# Patient Record
Sex: Male | Born: 1940 | Race: Black or African American | Hispanic: No | Marital: Married | Smoking: Former smoker
Health system: Southern US, Community
[De-identification: ages and names within clinical notes are randomized; demographics above are authoritative.]

## PROBLEM LIST (undated history)

## (undated) DIAGNOSIS — Z923 Personal history of irradiation: Secondary | ICD-10-CM

## (undated) DIAGNOSIS — Z9989 Dependence on other enabling machines and devices: Secondary | ICD-10-CM

## (undated) DIAGNOSIS — R399 Unspecified symptoms and signs involving the genitourinary system: Secondary | ICD-10-CM

## (undated) DIAGNOSIS — Z973 Presence of spectacles and contact lenses: Secondary | ICD-10-CM

## (undated) DIAGNOSIS — H1033 Unspecified acute conjunctivitis, bilateral: Secondary | ICD-10-CM

## (undated) DIAGNOSIS — I440 Atrioventricular block, first degree: Secondary | ICD-10-CM

## (undated) DIAGNOSIS — M1990 Unspecified osteoarthritis, unspecified site: Secondary | ICD-10-CM

## (undated) DIAGNOSIS — G4733 Obstructive sleep apnea (adult) (pediatric): Secondary | ICD-10-CM

## (undated) DIAGNOSIS — Z86010 Personal history of colonic polyps: Secondary | ICD-10-CM

## (undated) DIAGNOSIS — I491 Atrial premature depolarization: Secondary | ICD-10-CM

## (undated) DIAGNOSIS — N400 Enlarged prostate without lower urinary tract symptoms: Secondary | ICD-10-CM

## (undated) DIAGNOSIS — Z8739 Personal history of other diseases of the musculoskeletal system and connective tissue: Secondary | ICD-10-CM

## (undated) DIAGNOSIS — Z8669 Personal history of other diseases of the nervous system and sense organs: Secondary | ICD-10-CM

## (undated) DIAGNOSIS — Z87898 Personal history of other specified conditions: Secondary | ICD-10-CM

## (undated) DIAGNOSIS — I10 Essential (primary) hypertension: Secondary | ICD-10-CM

## (undated) DIAGNOSIS — C61 Malignant neoplasm of prostate: Secondary | ICD-10-CM

## (undated) DIAGNOSIS — E785 Hyperlipidemia, unspecified: Secondary | ICD-10-CM

## (undated) DIAGNOSIS — N320 Bladder-neck obstruction: Secondary | ICD-10-CM

## (undated) HISTORY — DX: Personal history of colonic polyps: Z86.010

## (undated) HISTORY — PX: TONSILLECTOMY: SUR1361

## (undated) HISTORY — DX: Unspecified osteoarthritis, unspecified site: M19.90

## (undated) HISTORY — DX: Hyperlipidemia, unspecified: E78.5

## (undated) HISTORY — DX: Essential (primary) hypertension: I10

---

## 1964-01-25 HISTORY — PX: APPENDECTOMY: SHX54

## 1998-10-20 ENCOUNTER — Ambulatory Visit (HOSPITAL_BASED_OUTPATIENT_CLINIC_OR_DEPARTMENT_OTHER): Admission: RE | Admit: 1998-10-20 | Discharge: 1998-10-20 | Payer: Self-pay | Admitting: Orthopedic Surgery

## 1999-02-01 ENCOUNTER — Ambulatory Visit (HOSPITAL_COMMUNITY): Admission: RE | Admit: 1999-02-01 | Discharge: 1999-02-01 | Payer: Self-pay | Admitting: Internal Medicine

## 1999-02-01 ENCOUNTER — Encounter: Payer: Self-pay | Admitting: Internal Medicine

## 2004-02-11 ENCOUNTER — Ambulatory Visit: Payer: Self-pay | Admitting: Internal Medicine

## 2004-02-17 ENCOUNTER — Ambulatory Visit: Payer: Self-pay | Admitting: Internal Medicine

## 2004-03-22 ENCOUNTER — Ambulatory Visit: Payer: Self-pay | Admitting: Internal Medicine

## 2004-03-22 ENCOUNTER — Encounter: Admission: RE | Admit: 2004-03-22 | Discharge: 2004-03-22 | Payer: Self-pay | Admitting: Internal Medicine

## 2004-03-27 ENCOUNTER — Emergency Department (HOSPITAL_COMMUNITY): Admission: EM | Admit: 2004-03-27 | Discharge: 2004-03-27 | Payer: Self-pay | Admitting: Emergency Medicine

## 2004-12-17 ENCOUNTER — Ambulatory Visit: Payer: Self-pay | Admitting: Internal Medicine

## 2005-02-21 ENCOUNTER — Ambulatory Visit: Payer: Self-pay | Admitting: Internal Medicine

## 2005-04-11 ENCOUNTER — Ambulatory Visit: Payer: Self-pay | Admitting: Internal Medicine

## 2005-05-02 ENCOUNTER — Ambulatory Visit: Payer: Self-pay | Admitting: Internal Medicine

## 2005-07-12 ENCOUNTER — Ambulatory Visit: Payer: Self-pay | Admitting: Internal Medicine

## 2005-08-16 ENCOUNTER — Ambulatory Visit: Payer: Self-pay | Admitting: Internal Medicine

## 2005-08-24 ENCOUNTER — Ambulatory Visit: Payer: Self-pay | Admitting: Internal Medicine

## 2005-09-15 ENCOUNTER — Ambulatory Visit: Payer: Self-pay | Admitting: Internal Medicine

## 2005-11-25 ENCOUNTER — Ambulatory Visit: Payer: Self-pay | Admitting: Internal Medicine

## 2006-05-30 ENCOUNTER — Ambulatory Visit: Payer: Self-pay | Admitting: Internal Medicine

## 2006-05-30 LAB — CONVERTED CEMR LAB
ALT: 29 units/L (ref 0–40)
AST: 19 units/L (ref 0–37)
BUN: 13 mg/dL (ref 6–23)
Cholesterol: 177 mg/dL (ref 0–200)
Creatinine, Ser: 1.1 mg/dL (ref 0.4–1.5)
HDL: 42.6 mg/dL (ref >39.0)
LDL Cholesterol: 117 mg/dL — ABNORMAL HIGH (ref 0–99)
PSA: 5.07 ng/mL — ABNORMAL HIGH (ref 0.10–4.00)
Total CHOL/HDL Ratio: 4.2
Triglycerides: 88 mg/dL (ref 0–149)
VLDL: 18 mg/dL (ref 0–40)

## 2006-06-23 ENCOUNTER — Ambulatory Visit: Payer: Self-pay | Admitting: Internal Medicine

## 2006-07-06 ENCOUNTER — Ambulatory Visit: Payer: Self-pay | Admitting: Internal Medicine

## 2006-07-06 HISTORY — PX: COLONOSCOPY: SHX174

## 2006-09-09 ENCOUNTER — Encounter: Payer: Self-pay | Admitting: *Deleted

## 2006-09-09 DIAGNOSIS — I10 Essential (primary) hypertension: Secondary | ICD-10-CM | POA: Insufficient documentation

## 2006-10-27 ENCOUNTER — Encounter: Payer: Self-pay | Admitting: Internal Medicine

## 2006-10-27 ENCOUNTER — Ambulatory Visit: Payer: Self-pay | Admitting: Internal Medicine

## 2006-10-27 DIAGNOSIS — E785 Hyperlipidemia, unspecified: Secondary | ICD-10-CM | POA: Insufficient documentation

## 2006-10-27 LAB — CONVERTED CEMR LAB
ALT: 29 units/L (ref 0–53)
AST: 21 units/L (ref 0–37)
BUN: 11 mg/dL (ref 6–23)
CO2: 34 meq/L — ABNORMAL HIGH (ref 19–32)
Calcium: 9.6 mg/dL (ref 8.4–10.5)
Chloride: 103 meq/L (ref 96–112)
Cholesterol: 153 mg/dL (ref 0–200)
Creatinine, Ser: 1.3 mg/dL (ref 0.4–1.5)
GFR calc Af Amer: 71 mL/min
GFR calc non Af Amer: 59 mL/min
Glucose, Bld: 109 mg/dL — ABNORMAL HIGH (ref 70–99)
HDL: 41.8 mg/dL (ref >39.0)
LDL Cholesterol: 89 mg/dL (ref 0–99)
Potassium: 4.2 meq/L (ref 3.5–5.1)
Sodium: 142 meq/L (ref 135–145)
Total CHOL/HDL Ratio: 3.7
Triglycerides: 112 mg/dL (ref 0–149)
VLDL: 22 mg/dL (ref 0–40)

## 2006-12-05 ENCOUNTER — Telehealth (INDEPENDENT_AMBULATORY_CARE_PROVIDER_SITE_OTHER): Payer: Self-pay | Admitting: *Deleted

## 2006-12-06 ENCOUNTER — Ambulatory Visit: Payer: Self-pay | Admitting: Internal Medicine

## 2006-12-19 ENCOUNTER — Encounter: Payer: Self-pay | Admitting: Internal Medicine

## 2007-01-19 ENCOUNTER — Telehealth: Payer: Self-pay | Admitting: Internal Medicine

## 2007-02-21 ENCOUNTER — Ambulatory Visit: Payer: Self-pay | Admitting: Internal Medicine

## 2007-03-15 ENCOUNTER — Ambulatory Visit: Payer: Self-pay | Admitting: Internal Medicine

## 2007-03-15 DIAGNOSIS — H6980 Other specified disorders of Eustachian tube, unspecified ear: Secondary | ICD-10-CM

## 2007-03-15 DIAGNOSIS — R109 Unspecified abdominal pain: Secondary | ICD-10-CM | POA: Insufficient documentation

## 2007-03-15 DIAGNOSIS — H6990 Unspecified Eustachian tube disorder, unspecified ear: Secondary | ICD-10-CM | POA: Insufficient documentation

## 2007-03-23 ENCOUNTER — Encounter: Payer: Self-pay | Admitting: Internal Medicine

## 2007-05-29 ENCOUNTER — Encounter: Payer: Self-pay | Admitting: Internal Medicine

## 2007-06-22 ENCOUNTER — Inpatient Hospital Stay (HOSPITAL_COMMUNITY): Admission: RE | Admit: 2007-06-22 | Discharge: 2007-06-27 | Payer: Self-pay | Admitting: Orthopedic Surgery

## 2007-06-22 HISTORY — PX: TOTAL KNEE ARTHROPLASTY: SHX125

## 2007-06-25 ENCOUNTER — Telehealth: Payer: Self-pay | Admitting: Internal Medicine

## 2007-07-25 ENCOUNTER — Ambulatory Visit: Payer: Self-pay | Admitting: Internal Medicine

## 2007-07-25 DIAGNOSIS — Z9889 Other specified postprocedural states: Secondary | ICD-10-CM

## 2007-10-24 ENCOUNTER — Ambulatory Visit: Payer: Self-pay | Admitting: Internal Medicine

## 2008-01-14 ENCOUNTER — Ambulatory Visit: Payer: Self-pay | Admitting: Internal Medicine

## 2008-01-14 LAB — CONVERTED CEMR LAB
ALT: 22 units/L (ref 0–53)
AST: 21 units/L (ref 0–37)
Albumin: 4 g/dL (ref 3.5–5.2)
Alkaline Phosphatase: 84 units/L (ref 39–117)
BUN: 11 mg/dL (ref 6–23)
Bilirubin, Direct: 0.1 mg/dL (ref 0.0–0.3)
CO2: 31 meq/L (ref 19–32)
Calcium: 9.1 mg/dL (ref 8.4–10.5)
Chloride: 103 meq/L (ref 96–112)
Cholesterol: 153 mg/dL (ref 0–200)
Creatinine, Ser: 1.2 mg/dL (ref 0.4–1.5)
GFR calc Af Amer: 78 mL/min
GFR calc non Af Amer: 65 mL/min
Glucose, Bld: 117 mg/dL — ABNORMAL HIGH (ref 70–99)
HDL: 46 mg/dL (ref >39.0)
LDL Cholesterol: 93 mg/dL (ref 0–99)
Potassium: 3.7 meq/L (ref 3.5–5.1)
Sodium: 141 meq/L (ref 135–145)
Total Bilirubin: 0.7 mg/dL (ref 0.3–1.2)
Total CHOL/HDL Ratio: 3.3
Total Protein: 7 g/dL (ref 6.0–8.3)
Triglycerides: 69 mg/dL (ref 0–149)
VLDL: 14 mg/dL (ref 0–40)

## 2008-01-16 ENCOUNTER — Encounter: Payer: Self-pay | Admitting: Internal Medicine

## 2008-05-01 ENCOUNTER — Ambulatory Visit: Payer: Self-pay | Admitting: Internal Medicine

## 2008-06-27 ENCOUNTER — Telehealth: Payer: Self-pay | Admitting: Internal Medicine

## 2008-08-07 ENCOUNTER — Telehealth: Payer: Self-pay | Admitting: Internal Medicine

## 2008-10-24 ENCOUNTER — Telehealth (INDEPENDENT_AMBULATORY_CARE_PROVIDER_SITE_OTHER): Payer: Self-pay | Admitting: *Deleted

## 2008-10-30 ENCOUNTER — Ambulatory Visit: Payer: Self-pay | Admitting: Internal Medicine

## 2009-03-17 ENCOUNTER — Ambulatory Visit: Payer: Self-pay | Admitting: Internal Medicine

## 2009-03-27 ENCOUNTER — Ambulatory Visit: Payer: Self-pay | Admitting: Internal Medicine

## 2009-03-27 DIAGNOSIS — M25569 Pain in unspecified knee: Secondary | ICD-10-CM

## 2009-03-27 DIAGNOSIS — M79609 Pain in unspecified limb: Secondary | ICD-10-CM

## 2009-03-27 LAB — CONVERTED CEMR LAB
BUN: 11 mg/dL (ref 6–23)
CO2: 32 meq/L (ref 19–32)
Calcium: 8.9 mg/dL (ref 8.4–10.5)
Chloride: 103 meq/L (ref 96–112)
Creatinine, Ser: 1.1 mg/dL (ref 0.4–1.5)
GFR calc non Af Amer: 85.9 mL/min (ref >60)
Glucose, Bld: 99 mg/dL (ref 70–99)
Potassium: 3.7 meq/L (ref 3.5–5.1)
Sed Rate: 33 mm/hr — ABNORMAL HIGH (ref 0–22)
Sodium: 141 meq/L (ref 135–145)
Uric Acid, Serum: 7.5 mg/dL (ref 4.0–7.8)

## 2009-06-04 IMAGING — CR DG KNEE 1-2V PORT*R*
2 series · 2 of 2 positions shown · non-contrast
Comparison: None

CLINICAL DATA: OA right knee.  Status post right TKR

PORTABLE RIGHT KNEE - 1-2 VIEW

[view not recorded (1 of 2)]
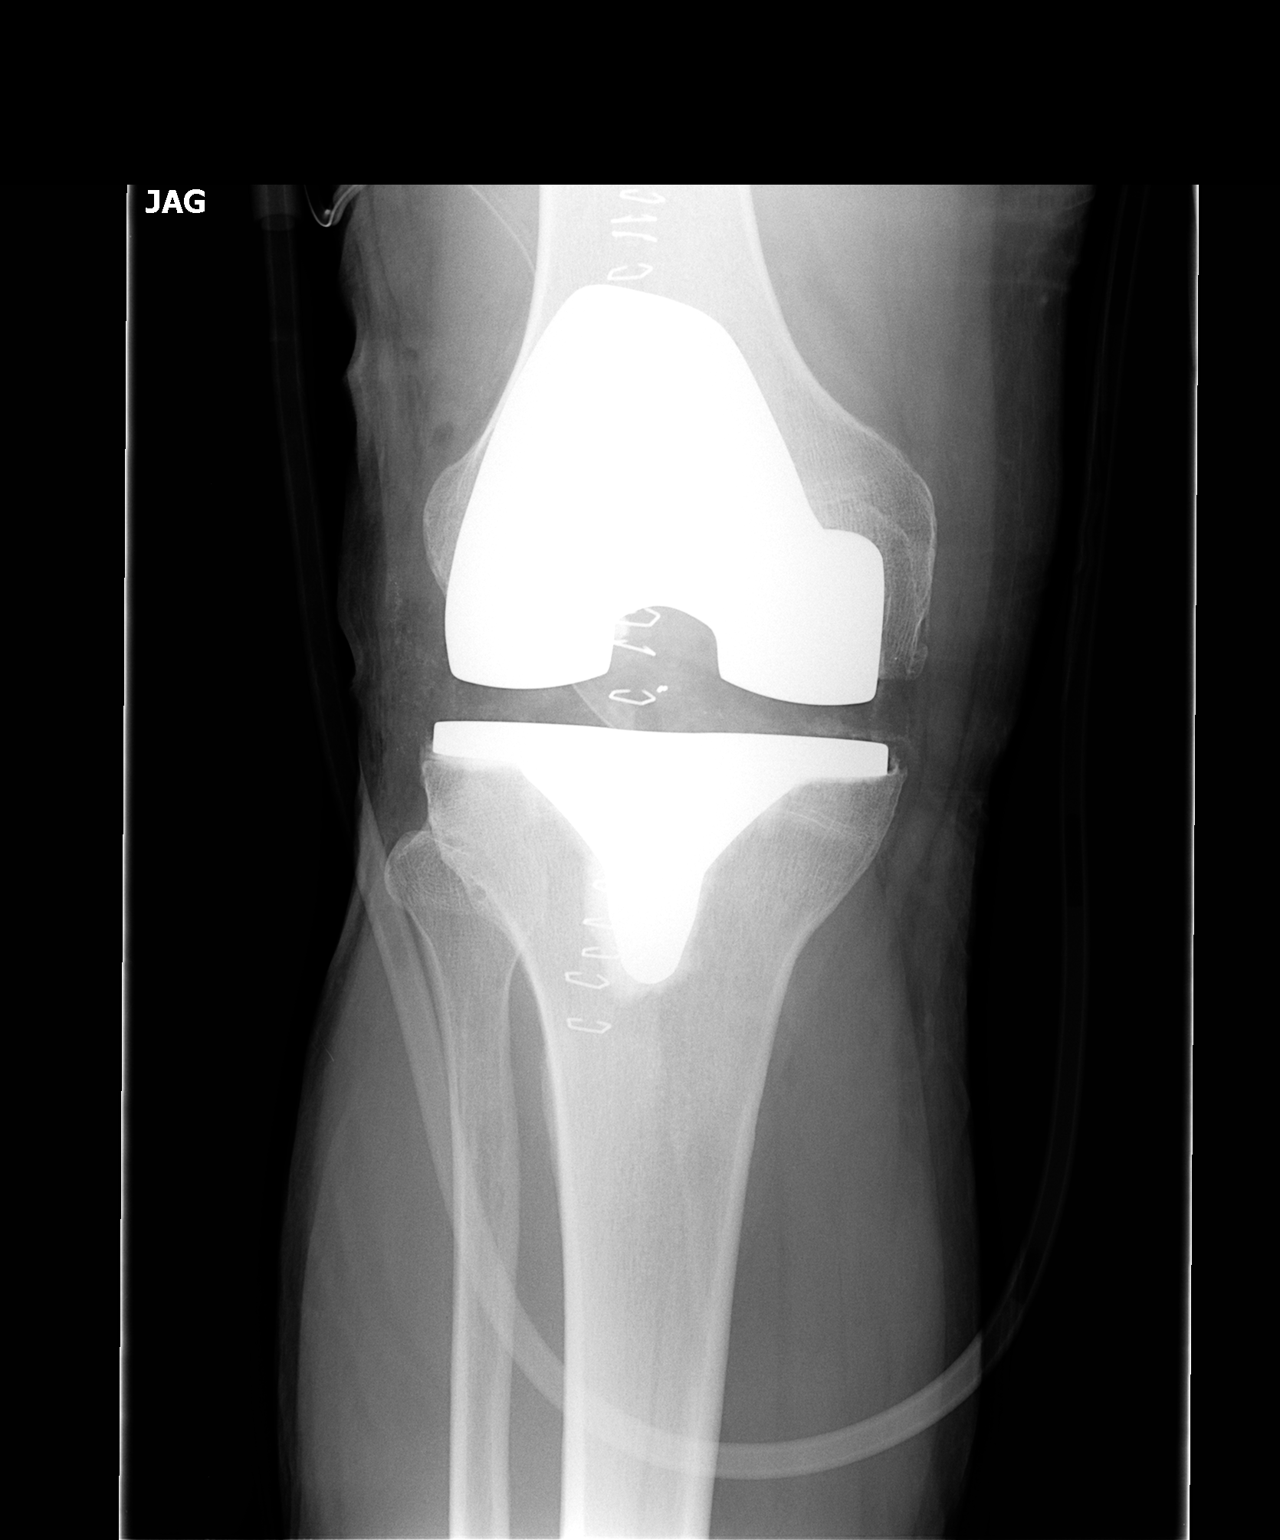

[view not recorded (2 of 2)]
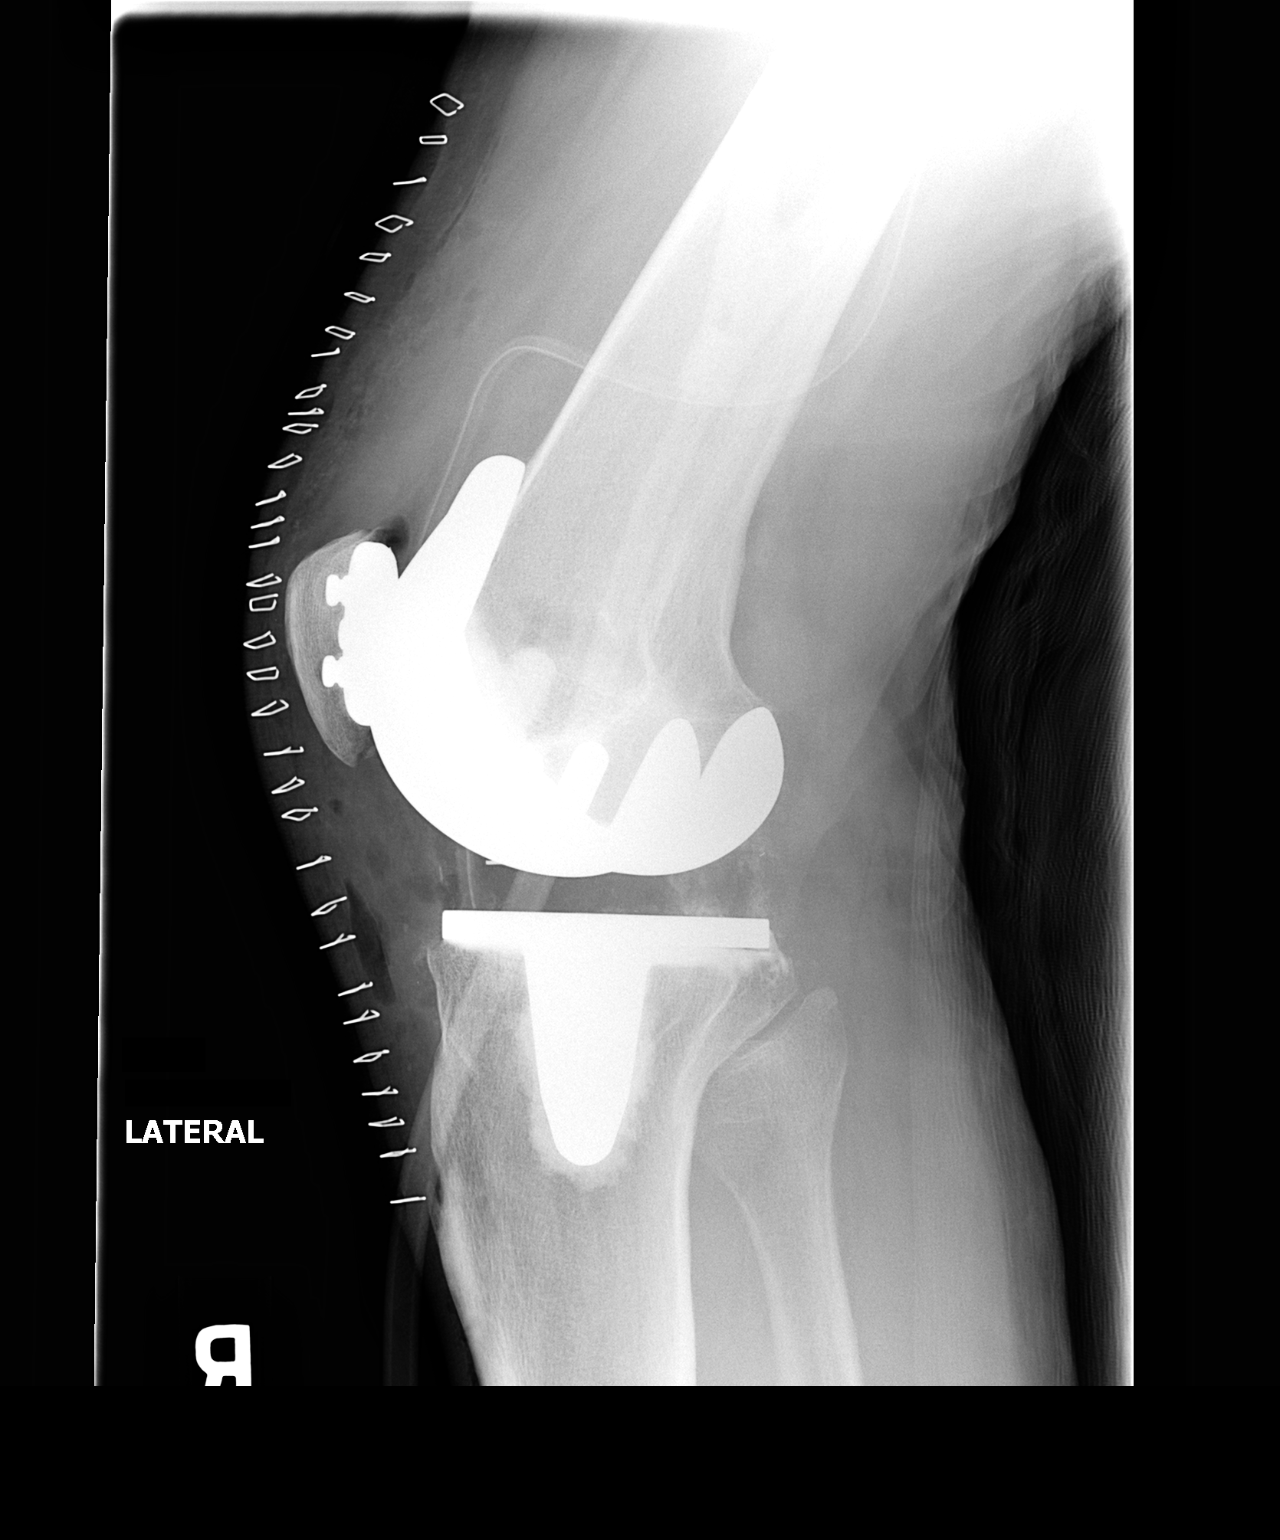

[2 of 2 positions shown; findings below may reference images not displayed]

FINDINGS: The patient the is status post right total knee
arthroplasty.  The distal femoral, proximal tibial, and patellar
prosthetic components appear in satisfactory position and
alignment.  No periprosthetic abnormality.  Radiopaque drain and
anterior row of superficial staples are noted.
IMPRESSION: Satisfactory appearance of right TKR

## 2009-08-06 ENCOUNTER — Ambulatory Visit: Payer: Self-pay | Admitting: Internal Medicine

## 2009-08-06 DIAGNOSIS — N320 Bladder-neck obstruction: Secondary | ICD-10-CM

## 2009-08-06 LAB — CONVERTED CEMR LAB
Bilirubin Urine: NEGATIVE
Nitrite: NEGATIVE
Protein, U semiquant: NEGATIVE
Specific Gravity, Urine: 1.02

## 2009-08-20 ENCOUNTER — Telehealth: Payer: Self-pay | Admitting: Internal Medicine

## 2009-09-08 ENCOUNTER — Telehealth: Payer: Self-pay | Admitting: Internal Medicine

## 2009-09-09 ENCOUNTER — Telehealth: Payer: Self-pay | Admitting: Internal Medicine

## 2009-09-24 ENCOUNTER — Ambulatory Visit: Payer: Self-pay | Admitting: Internal Medicine

## 2009-11-19 ENCOUNTER — Telehealth: Payer: Self-pay | Admitting: Internal Medicine

## 2009-12-11 ENCOUNTER — Ambulatory Visit: Payer: Self-pay | Admitting: Internal Medicine

## 2010-02-25 NOTE — Progress Notes (Signed)
  Phone Note Other Incoming   Caller: walk in    Prescriptions: TAMSULOSIN HCL 0.4 MG CAPS (TAMSULOSIN HCL) 1 by mouth once daily  #90 x 3   Entered by:   Ami Bullins CMA   Authorized by:   Jacques Navy MD   Signed by:   Bill Salinas CMA on 09/08/2009   Method used:   Electronically to        [facility] [phone]* (retail)       [address]       [place], [place]  [address]       Ph: [phone]       Fax: [phone]   RxID:   [id number]

## 2010-02-25 NOTE — Assessment & Plan Note (Signed)
Summary: left leg swollen and tremendous pain-norins-lb   Vitals Entered By: Tora Perches (March 27, 2009 1:32 PM) CC: swollen left leg  since yesterday Is Patient Diabetic? No   Primary Care Provider:  Norins  CC:  swollen left leg  since yesterday.  History of Present Illness: C/o severe sharp L knee pain since last night. No injury.  Preventive Screening-Counseling & Management  Alcohol-Tobacco     Smoking Status: quit < 6 months  Allergies: No Known Drug Allergies  Past History:  Past Medical History: Last updated: 10/27/2006 UCD Hypertension osteoarthritis-mostly knees back pain Hyperlipidemia  Social History: Last updated: 03/15/2007 married 8 years-divorced. 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  Social History: Smoking Status:  quit < 6 months  Physical Exam  General:  Well-developed,well-nourished,in no acute distress; alert,appropriate and cooperative throughout examination Lungs:  Normal respiratory effort, chest expands symmetrically. Lungs are clear to auscultation, no crackles or wheezes. Heart:  normal rate and regular rhythm.   Msk:  Walking w/a cane, limping L knee w/mild effusion, tender Extremities:  No edema Skin:  No rash Psych:  Oriented X3.     Impression & Recommendations:  Problem # 1:  KNEE PAIN (ICD-719.46) L - likely OA flare Assessment New Will tap if  not better by Mon His updated medication list for this problem includes:    Adult Aspirin Ec Low Strength 81 Mg Tbec (Aspirin) ..... Once daily    Hydrocodone-acetaminophen 5-325 Mg Tabs (Hydrocodone-acetaminophen) .Marland Kitchen... 1-2 by mouth two times a day as needed pain    Ibuprofen 600 Mg Tabs (Ibuprofen) .Marland Kitchen... 1 by mouth bid  pc x 1 wk then as needed for  pain  Orders: TLB-BMP (Basic Metabolic Panel-BMET) (80048-METABOL) TLB-Sedimentation Rate (ESR) (85652-ESR) TLB-Uric Acid, Blood (84550-URIC) Ace Wraps 3-5  in/yard  (E4540)  Problem # 2:  HYPERTENSION (ICD-401.9) Assessment: Deteriorated  The following medications were removed from the medication list:    Benicar 20 Mg Tabs (Olmesartan medoxomil) .Marland Kitchen... 1 once daily His updated medication list for this problem includes:    Cardizem Cd 360 Mg Cp24 (Diltiazem hcl coated beads) .Marland Kitchen... 1 once daily    Furosemide 40 Mg Tabs (Furosemide) .Marland Kitchen... 1 once daily    Benicar 40 Mg Tabs (Olmesartan medoxomil) .Marland Kitchen... 1 by mouth once daily for blood pressure  Orders: TLB-BMP (Basic Metabolic Panel-BMET) (80048-METABOL) TLB-Sedimentation Rate (ESR) (85652-ESR) TLB-Uric Acid, Blood (84550-URIC)  Problem # 3:  BRONCHITIS-ACUTE (ICD-466.0) Assessment: Improved Finish meds His updated medication list for this problem includes:    Sulfamethoxazole-tmp Ds 800-160 Mg Tabs (Sulfamethoxazole-trimethoprim) .Marland Kitchen... 1 by mouth two times a day x 7 for bronchitis    Promethazine-codeine 6.25-10 Mg/37ml Syrp (Promethazine-codeine) .Marland Kitchen... 1 tsp q 6 hours for cough  Complete Medication List: 1)  Flonase 50 Mcg/act Susp (Fluticasone propionate) .... As needed 2)  Adult Aspirin Ec Low Strength 81 Mg Tbec (Aspirin) .... Once daily 3)  Cardizem Cd 360 Mg Cp24 (Diltiazem hcl coated beads) .Marland Kitchen.. 1 once daily 4)  Lovastatin 20 Mg Tabs (Lovastatin) .... Two tabs in the am 5)  Furosemide 40 Mg Tabs (Furosemide) .Marland Kitchen.. 1 once daily 6)  Sulfamethoxazole-tmp Ds 800-160 Mg Tabs (Sulfamethoxazole-trimethoprim) .Marland Kitchen.. 1 by mouth two times a day x 7 for bronchitis 7)  Promethazine-codeine 6.25-10 Mg/88ml Syrp (Promethazine-codeine) .Marland Kitchen.. 1 tsp q 6 hours for cough 8)  Benicar 40 Mg Tabs (Olmesartan medoxomil) .Marland Kitchen.. 1 by mouth once daily for blood  pressure 9)  Hydrocodone-acetaminophen 5-325 Mg Tabs (Hydrocodone-acetaminophen) .Marland Kitchen.. 1-2 by mouth two times a day as needed pain 10)  Ibuprofen 600 Mg Tabs (Ibuprofen) .Marland Kitchen.. 1 by mouth bid  pc x 1 wk then as needed for  pain  Other Orders: Depo- Medrol  40mg  (J1030) Depo- Medrol 80mg  (J1040) Admin of Therapeutic Inj  intramuscular or subcutaneous (04540)  Patient Instructions: 1)  Please schedule a follow-up appointment in 2 weeks Dr Debby Bud. 2)  Call if you are not better in a reasonable amount of time or if worse. 3)  Pennsaid 4 gtt three times a day samples  Prescriptions: IBUPROFEN 600 MG TABS (IBUPROFEN) 1 by mouth bid  pc x 1 wk then as needed for  pain  #60 x 3   Entered and Authorized by:   Tresa Garter MD   Signed by:   Tresa Garter MD on 03/27/2009   Method used:   Print then Give to Patient   RxID:   [id number] HYDROCODONE-ACETAMINOPHEN 5-325 MG TABS (HYDROCODONE-ACETAMINOPHEN) 1-2 by mouth two times a day as needed pain  #60 x 1   Entered and Authorized by:   Tresa Garter MD   Signed by:   Tresa Garter MD on 03/27/2009   Method used:   Print then Give to Patient   RxID:   [id number] BENICAR 40 MG TABS (OLMESARTAN MEDOXOMIL) 1 by mouth once daily for blood pressure  #30 x 12   Entered and Authorized by:   Tresa Garter MD   Signed by:   Tresa Garter MD on 03/27/2009   Method used:   [facility] to        [facility]  [address] [phone][facility] (retail)       [address]       [place], [place]  [address]       Ph: [phone]       Fax: [phone]   RxID:   [id number]    Medication Administration  Injection # 1:    Medication: Depo- Medrol 40mg     Diagnosis: LEG PAIN, LEFT (ICD-729.5)    Route: IM    Site: RUOQ gluteus    Exp Date: 10/2009    Lot #: [id number]    Mfr: teva    Patient tolerated injection without complications    Given by: Tora Perches (March 27, 2009 3:18 PM)  Injection # 2:    Medication: Depo- Medrol 80mg     Diagnosis: LEG PAIN, LEFT (ICD-729.5)    Route: IM    Site: RUOQ gluteus    Exp Date: 10/2009    Lot #: [id number]    Mfr: teva    Patient tolerated injection without complications    Given by: Tora Perches  (March 27, 2009 3:18 PM)  Orders Added: 1)  TLB-BMP (Basic Metabolic Panel-BMET) [80048-METABOL] 2)  TLB-Sedimentation Rate (ESR) [85652-ESR] 3)  TLB-Uric Acid, Blood [84550-URIC] 4)  Est. Patient Level IV [32202] 5)  Ace Wraps 3-5 in/yard  [A6449] 6)  Depo- Medrol 40mg  [J1030] 7)  Depo- Medrol 80mg  [J1040] 8)  Admin of Therapeutic Inj  intramuscular or subcutaneous [54270]

## 2010-02-25 NOTE — Assessment & Plan Note (Signed)
Summary: PROBLEMS URINATING/NWS  #   Vital Signs:  Patient profile:   70 year old male Height:      69 inches Weight:      225 pounds BMI:     33.35 O2 Sat:      97 % on Room air Temp:     98.3 degrees F oral Pulse rate:   69 / minute BP sitting:   130 / 90  (left arm) Cuff size:   large  Vitals Entered By: Bill Salinas CMA (August 06, 2009 3:42 PM)  O2 Flow:  Room air CC: pt c/o urinary freq. He states he gets up to use restroom about 10 times in one night/ ab   Primary Care Provider:  Laquesha Holcomb  CC:  pt c/o urinary freq. He states he gets up to use restroom about 10 times in one night/ ab.  History of Present Illness: Patient presents for increased urinary freqeuncy and nocturia. He has decreased force of stream, intermittancy, post-void dribble, nocturia. He denies fever, perineal pain, low back pain, groin pain.   Current Medications (verified): 1)  Flonase 50 Mcg/act  Susp (Fluticasone Propionate) .... As Needed 2)  Adult Aspirin Ec Low Strength 81 Mg  Tbec (Aspirin) .... Once Daily 3)  Cardizem Cd 360 Mg  Cp24 (Diltiazem Hcl Coated Beads) .Marland Kitchen.. 1 Once Daily 4)  Lovastatin 20 Mg  Tabs (Lovastatin) .... Two Tabs in The Am 5)  Furosemide 40 Mg  Tabs (Furosemide) .Marland Kitchen.. 1 Once Daily 6)  Sulfamethoxazole-Tmp Ds 800-160 Mg Tabs (Sulfamethoxazole-Trimethoprim) .Marland Kitchen.. 1 By Mouth Two Times A Day X 7 For Bronchitis 7)  Promethazine-Codeine 6.25-10 Mg/22ml Syrp (Promethazine-Codeine) .Marland Kitchen.. 1 Tsp Q 6 Hours For Cough 8)  Benicar 40 Mg Tabs (Olmesartan Medoxomil) .Marland Kitchen.. 1 By Mouth Once Daily For Blood Pressure 9)  Hydrocodone-Acetaminophen 5-325 Mg Tabs (Hydrocodone-Acetaminophen) .Marland Kitchen.. 1-2 By Mouth Two Times A Day As Needed Pain 10)  Ibuprofen 600 Mg Tabs (Ibuprofen) .Marland Kitchen.. 1 By Mouth Bid  Pc X 1 Wk Then As Needed For  Pain  Allergies (verified): No Known Drug Allergies  Past History:  Past Medical History: Last updated: 10/27/2006 UCD Hypertension osteoarthritis-mostly knees back  pain Hyperlipidemia  Past Surgical History: Last updated: 07/25/2007 Appendectomy Tonsillectomy Total knee replacement, right - May '09  Family History: Last updated: 03/26/07 parents deceased of unkown causes. neg- DM, CAD, Colon or prostate cancer.  Social History: Last updated: 03/26/2007 married 8 years-divorced. 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  Review of Systems  The patient denies anorexia, fever, weight loss, weight gain, decreased hearing, hoarseness, chest pain, peripheral edema, headaches, abdominal pain, hematochezia, hematuria, muscle weakness, difficulty walking, unusual weight change, abnormal bleeding, and angioedema.    Physical Exam  General:  overweight AA male in no distress Head:  Normocephalic and atraumatic without obvious abnormalities. No apparent alopecia or balding. Ears:  cerumen impaction left ear. Lungs:  normal respiratory effort and normal breath sounds.   Heart:  normal rate and regular rhythm.     Impression & Recommendations:  Problem # 1:  BENIGN PROSTATIC HYPERTROPHY, WITH URINARY OBSTRUCTION (ICD-600.01) symptoms are classic for mild BOO secondary to BPH  Plan - trial of tamulosin + dusteride, if successful will write for generics.  His updated medication list for this problem includes:    Jalyn 0.5-0.4 Mg Caps (Dutasteride-tamsulosin hcl) .Marland Kitchen... 1 by mouth once daily  Problem # 2:  CERUMEN IMPACTION, LEFT (  ICD-380.4) ear was impaction left. Successfully irrigated by  Ami Bullin, CMA  Complete Medication List: 1)  Flonase 50 Mcg/act Susp (Fluticasone propionate) .... As needed 2)  Adult Aspirin Ec Low Strength 81 Mg Tbec (Aspirin) .... Once daily 3)  Cardizem Cd 360 Mg Cp24 (Diltiazem hcl coated beads) .Marland Kitchen.. 1 once daily 4)  Lovastatin 20 Mg Tabs (Lovastatin) .... Two tabs in the am 5)  Furosemide 40 Mg Tabs (Furosemide) .Marland Kitchen.. 1 once daily 6)   Sulfamethoxazole-tmp Ds 800-160 Mg Tabs (Sulfamethoxazole-trimethoprim) .Marland Kitchen.. 1 by mouth two times a day x 7 for bronchitis 7)  Promethazine-codeine 6.25-10 Mg/59ml Syrp (Promethazine-codeine) .Marland Kitchen.. 1 tsp q 6 hours for cough 8)  Benicar 40 Mg Tabs (Olmesartan medoxomil) .Marland Kitchen.. 1 by mouth once daily for blood pressure 9)  Hydrocodone-acetaminophen 5-325 Mg Tabs (Hydrocodone-acetaminophen) .Marland Kitchen.. 1-2 by mouth two times a day as needed pain 10)  Ibuprofen 600 Mg Tabs (Ibuprofen) .Marland Kitchen.. 1 by mouth bid  pc x 1 wk then as needed for  pain 11)  Jalyn 0.5-0.4 Mg Caps (Dutasteride-tamsulosin hcl) .Marland Kitchen.. 1 by mouth once daily  Patient Instructions: 1)  Urinary problems - sounds like prostatism - symptoms of enlarged prostate gland leading to poor bladder emptying and urinary frequency. Plan - take Jalyn once a day (21 days of samples). Let me know if this helps.   Laboratory Results   Urine Tests   Date/Time Reported: Ami Bullins CMA  August 06, 2009 3:49 PM   Routine Urinalysis   Color: straw Appearance: Clear Glucose: negative   (Normal Range: Negative) Bilirubin: negative   (Normal Range: Negative) Ketone: negative   (Normal Range: Negative) Spec. Gravity: 1.020   (Normal Range: 1.003-1.035) Blood: moderate   (Normal Range: Negative) pH: 5.0   (Normal Range: 5.0-8.0) Protein: negative   (Normal Range: Negative) Urobilinogen: 0.2   (Normal Range: 0-1) Nitrite: negative   (Normal Range: Negative) Leukocyte Esterace: negative   (Normal Range: Negative)

## 2010-02-25 NOTE — Progress Notes (Signed)
  Phone Note Other Incoming   Caller: Walk in Details for Reason: Cardizem too expensive Summary of Call: Pt walked in and states that his cardizem is too expensive, he cannot afford the copay on this medication and would like a cheaper alternative. Please Adise. Rx to [facility] on [address] Initial call taken by: Ami Bullins CMA,  November 19, 2009 1:05 PM  Follow-up for Phone Call        change to taztia XT 360 1 a day - cheap as a generic and less expensive than brand name cardiazem CD Follow-up by: Jacques Navy MD,  November 19, 2009 1:25 PM    New/Updated Medications: TAZTIA XT 360 MG XR24H-CAP (DILTIAZEM HCL ER BEADS) 1 by mouth once daily Prescriptions: TAZTIA XT 360 MG XR24H-CAP (DILTIAZEM HCL ER BEADS) 1 by mouth once daily  #90 x 1   Entered by:   Ami Bullins CMA   Authorized by:   Jacques Navy MD   Signed by:   Bill Salinas CMA on 11/19/2009   Method used:   Electronically to        [facility] [phone]* (retail)       [address]       [place], [place]  [address]       Ph: [phone]       Fax: [phone]   RxID:   [id number]

## 2010-02-25 NOTE — Assessment & Plan Note (Signed)
Summary: CHEST CONGEST-CHEST TIGHTNESS-SWEATS X LAST THUR OR FRI PER W...   Vital Signs:  Patient profile:   70 year old male Height:      69 inches Weight:      220.25 pounds BMI:     32.64 O2 Sat:      97 % on Room air Temp:     98.2 degrees F oral Pulse rate:   84 / minute BP sitting:   142 / 90  (left arm) Cuff size:   large  Vitals Entered By: Brenton Grills (March 17, 2009 9:19 AM)  O2 Flow:  Room air CC: Pt c/o pain with cough, chest congestion x 5 days. Pt took Robitussin, Mucinex with some relief./aj   Primary Care Provider:  Zoriana Oats  CC:  Pt c/o pain with cough, chest congestion x 5 days. Pt took Robitussin, and Mucinex with some relief./aj.  History of Present Illness: Patient presents with a four day h/o cough and congestion. His chest hurts when he cough. No fever, no chills, did have some sweating. He is producing nasty sputum. He has no increased SOB. No N/V/D. He has tried robitussin and mucinex and alkaselzer cold plus.   Current Medications (verified): 1)  Flonase 50 Mcg/act  Susp (Fluticasone Propionate) .... As Needed 2)  Adult Aspirin Ec Low Strength 81 Mg  Tbec (Aspirin) .... Once Daily 3)  Cardizem Cd 360 Mg  Cp24 (Diltiazem Hcl Coated Beads) .Marland Kitchen.. 1 Once Daily 4)  Lovastatin 20 Mg  Tabs (Lovastatin) .... Two Tabs in The Am 5)  Furosemide 40 Mg  Tabs (Furosemide) .Marland Kitchen.. 1 Once Daily 6)  Benicar 20 Mg  Tabs (Olmesartan Medoxomil) .Marland Kitchen.. 1 Once Daily 7)  Sulfamethoxazole-Tmp Ds 800-160 Mg Tabs (Sulfamethoxazole-Trimethoprim) .Marland Kitchen.. 1 By Mouth Two Times A Day X 7 For Bronchitis 8)  Prednisone 10 Mg Tabs (Prednisone) .... 3 Tabs Once Daily X 2 Days, 2 Tabs Once Daily X 3, 1 Tab Once Daily X 6  Allergies (verified): No Known Drug Allergies  Past History:  Past Medical History: Last updated: 10/27/2006 UCD Hypertension osteoarthritis-mostly knees back pain Hyperlipidemia  Past Surgical History: Last updated:  07/25/2007 Appendectomy Tonsillectomy Total knee replacement, right - May '09  Family History: Last updated: March 30, 2007 parents deceased of unkown causes. neg- DM, CAD, Colon or prostate cancer.  Social History: Last updated: 03-30-07 married 8 years-divorced. 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  Risk Factors: Caffeine Use: 2 (10/27/2006)  Risk Factors: Smoking Status: quit (10/27/2006)  Review of Systems  The patient denies anorexia, fever, weight loss, weight gain, decreased hearing, hoarseness, chest pain, dyspnea on exertion, prolonged cough, headaches, abdominal pain, hematochezia, incontinence, muscle weakness, transient blindness, difficulty walking, unusual weight change, and enlarged lymph nodes.    Physical Exam  General:  Well-developed,well-nourished,in no acute distress; alert,appropriate and cooperative throughout examination Head:  Normocephalic and atraumatic without obvious abnormalities. No apparent alopecia or balding. Eyes:  corneas and lenses clear and no injection.   Ears:  cerumen bilaterally but TMs are OK Neck:  full ROM, no masses, and no thyromegaly.   Chest Wall:  no deformities and no tenderness.   Lungs:  Normal respiratory effort, chest expands symmetrically. Lungs are clear to auscultation, no crackles or wheezes. Heart:  normal rate and regular rhythm.     Impression & Recommendations:  Problem # 1:  BRONCHITIS-ACUTE (ICD-466.0) Probable bronchitis.  Plan - doxycycline 100mg  two times a day  x 7           continue supportive care.   His updated medication list for this problem includes:    Sulfamethoxazole-tmp Ds 800-160 Mg Tabs (Sulfamethoxazole-trimethoprim) .Marland Kitchen... 1 by mouth two times a day x 7 for bronchitis    Promethazine-codeine 6.25-10 Mg/70ml Syrp (Promethazine-codeine) .Marland Kitchen... 1 tsp q 6 hours for cough  Complete Medication List: 1)  Flonase 50 Mcg/act Susp  (Fluticasone propionate) .... As needed 2)  Adult Aspirin Ec Low Strength 81 Mg Tbec (Aspirin) .... Once daily 3)  Cardizem Cd 360 Mg Cp24 (Diltiazem hcl coated beads) .Marland Kitchen.. 1 once daily 4)  Lovastatin 20 Mg Tabs (Lovastatin) .... Two tabs in the am 5)  Furosemide 40 Mg Tabs (Furosemide) .Marland Kitchen.. 1 once daily 6)  Benicar 20 Mg Tabs (Olmesartan medoxomil) .Marland Kitchen.. 1 once daily 7)  Sulfamethoxazole-tmp Ds 800-160 Mg Tabs (Sulfamethoxazole-trimethoprim) .Marland Kitchen.. 1 by mouth two times a day x 7 for bronchitis 8)  Promethazine-codeine 6.25-10 Mg/48ml Syrp (Promethazine-codeine) .Marland Kitchen.. 1 tsp q 6 hours for cough  Patient Instructions: 1)  Probable  bronchitis - plan - doxycyline 100mg  two times a day x 7 days (antibiotic); continue mucinex, cough syrup with codeine 1 tsp every 6 hours, for chest wall pain associated with cough use advil or aleve.  Prescriptions: PROMETHAZINE-CODEINE 6.25-10 MG/5ML SYRP (PROMETHAZINE-CODEINE) 1 tsp q 6 hours for cough  #8 oz x 1   Entered and Authorized by:   Jacques Navy MD   Signed by:   Jacques Navy MD on 03/17/2009   Method used:   Handwritten   RxID:   [id number]

## 2010-02-25 NOTE — Progress Notes (Signed)
Summary: Jeffrey Carter  Phone Note Call from Patient Call back at Home Phone [phone]   Caller: Patient Reason for Call: Talk to Nurse Details for Reason: FYI Summary of Call: Pt wanted to let md know samplles that was given Haynes Bast) is working well for him Initial call taken by: Orlan Leavens RMA,  August 20, 2009 1:27 PM  Follow-up for Phone Call        OK for Rx tamsulosin 0.4 mg once daily, #30, refill as needed and Finasteride 5mg  1 by mouth once daily , #30 refill prn Follow-up by: Jacques Navy MD,  August 20, 2009 5:04 PM  Additional Follow-up for Phone Call Additional follow up Details #1::        Pt informed  Additional Follow-up by: Lamar Sprinkles, CMA,  August 21, 2009 9:20 AM    New/Updated Medications: TAMSULOSIN HCL 0.4 MG CAPS (TAMSULOSIN HCL) 1 by mouth once daily FINASTERIDE 5 MG TABS (FINASTERIDE) 1 by mouth once daily Prescriptions: FINASTERIDE 5 MG TABS (FINASTERIDE) 1 by mouth once daily  #90 x 3   Entered by:   Lamar Sprinkles, CMA   Authorized by:   Jacques Navy MD   Signed by:   Lamar Sprinkles, CMA on 08/21/2009   Method used:   [facility]ron[facility] to        [facility]  [address] [phone]* (retail)       [address]       [place], [place]  [address]       Ph: [phone]       Fax: [phone]   RxID:   [id number] TAMSULOSIN HCL 0.4 MG CAPS (TAMSULOSIN HCL) 1 by mouth once daily  #90 x 3   Entered by:   Lamar Sprinkles, CMA   Authorized by:   Jacques Navy MD   Signed by:   Lamar Sprinkles, CMA on 08/21/2009   Method used:   [facility]  [address] [phone]* (retail)       [address]       [place], [place]  [address]       Ph: [phone]       Fax: [phone]   RxID:   [id number]

## 2010-02-25 NOTE — Progress Notes (Signed)
  Phone Note Refill Request Message from:  Fax from Pharmacy on September 09, 2009 10:26 AM  Refills Requested: Medication #1:  HYDROCODONE-ACETAMINOPHEN 5-325 MG TABS 1-2 by mouth two times a day as needed pain Last OV was 08/06/2009. Please Advise refill  Initial call taken by: Ami Bullins CMA,  September 09, 2009 10:26 AM  Follow-up for Phone Call        ok for refill x 2 Follow-up by: Jacques Navy MD,  September 09, 2009 12:26 PM    Prescriptions: HYDROCODONE-ACETAMINOPHEN 5-325 MG TABS (HYDROCODONE-ACETAMINOPHEN) 1-2 by mouth two times a day as needed pain  #60 x 2   Entered by:   Bill Salinas CMA   Authorized by:   Jacques Navy MD   Signed by:   Bill Salinas CMA on 09/09/2009   Method used:   Telephoned to ...       [facility]  [address] [id number] ([facility]ail[facility]       [address]       [place], [place]  [address]       Ph: [phone]       Fax: [phone]   RxID:   [id number]

## 2010-02-25 NOTE — Assessment & Plan Note (Signed)
Summary: VIRUS/ WEAK/ NO FEVER TODAY/ NWS   Vital Signs:  Patient profile:   70 year old male Height:      69 inches Weight:      220 pounds BMI:     32.61 O2 Sat:      97 % on Room air Temp:     98.7 degrees F oral Pulse rate:   83 / minute BP sitting:   142 / 98  (left arm) Cuff size:   large  Vitals Entered By: Bill Salinas CMA (December 11, 2009 2:12 PM)  O2 Flow:  Room air CC: pt here with c/o weakness, fatigue and loss of appetite/ ab   Primary Care Defne Gerling:  Norins  CC:  pt here with c/o weakness and fatigue and loss of appetite/ ab.  History of Present Illness: Patient reports the on-set Wednsday of generalized weakness followed by nausea and vomitng x 1. He has had looses stools. He denies fevers, he had chills Wednsday night and has persistent diffuse myalgia. He is having some on-going stomach discomfort. He has been anorexic but has been able to keep down fluids. He has not taken any OTC medications for his symptoms.  Current Medications (verified): 1)  Flonase 50 Mcg/act  Susp (Fluticasone Propionate) .... As Needed 2)  Adult Aspirin Ec Low Strength 81 Mg  Tbec (Aspirin) .... Once Daily 3)  Taztia Xt 360 Mg Xr24h-Cap (Diltiazem Hcl Er Beads) .Marland Kitchen.. 1 By Mouth Once Daily 4)  Lovastatin 20 Mg  Tabs (Lovastatin) .... Two Tabs in The Am 5)  Furosemide 40 Mg  Tabs (Furosemide) .Marland Kitchen.. 1 Once Daily 6)  Sulfamethoxazole-Tmp Ds 800-160 Mg Tabs (Sulfamethoxazole-Trimethoprim) .Marland Kitchen.. 1 By Mouth Two Times A Day X 7 For Bronchitis 7)  Promethazine-Codeine 6.25-10 Mg/89ml Syrp (Promethazine-Codeine) .Marland Kitchen.. 1 Tsp Q 6 Hours For Cough 8)  Benicar 40 Mg Tabs (Olmesartan Medoxomil) .Marland Kitchen.. 1 By Mouth Once Daily For Blood Pressure 9)  Hydrocodone-Acetaminophen 5-325 Mg Tabs (Hydrocodone-Acetaminophen) .Marland Kitchen.. 1-2 By Mouth Two Times A Day As Needed Pain 10)  Ibuprofen 600 Mg Tabs (Ibuprofen) .Marland Kitchen.. 1 By Mouth Bid  Pc X 1 Wk Then As Needed For  Pain 11)  Tamsulosin Hcl 0.4 Mg Caps (Tamsulosin Hcl) .Marland Kitchen..  1 By Mouth Once Daily 12)  Finasteride 5 Mg Tabs (Finasteride) .Marland Kitchen.. 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 neg- DM, CAD, Colon or prostate cancer.  Social History: Last updated: 2007-04-08 married 8 years-divorced. 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  Review of Systems       The patient complains of anorexia.  The patient denies fever, weight loss, weight gain, decreased hearing, hoarseness, chest pain, dyspnea on exertion, peripheral edema, headaches, hemoptysis, melena, incontinence, muscle weakness, suspicious skin lesions, difficulty walking, unusual weight change, abnormal bleeding, enlarged lymph nodes, and angioedema.    Physical Exam  General:  alert, well-developed, well-nourished, and well-hydrated.  Does appear ill and uncomfortable Head:  normocephalic and atraumatic.   Eyes:  pupils equal, pupils round, corneas and lenses clear, and no injection.   Ears:  Cerumn but no impaction. TM's OK Mouth:  throat clear Neck:  supple, full ROM,  and no masses.   Lungs:  normal respiratory effort, normal breath sounds, no dullness, and no wheezes.   Heart:  normal rate and regular rhythm.   Abdomen:  soft, normal bowel sounds, no guarding, and no rigidity.  Minimal tenderness to firm percussion. No HSM Msk:  no joint tenderness, no joint swelling, no joint warmth, and no joint instability.   Pulses:  2+ radial Neurologic:  alert & oriented X3, cranial nerves II-XII intact, strength normal in all extremities, and gait normal.     Impression &  Recommendations:  Problem # 1:  GASTROENTERITIS, VIRAL, ACUTE (ICD-008.8) Patient with symptoms c/w acute viral gastroenteritis. He does no have an acute abdomen. He does not appear dehydrated.   Plan - phenegan for N/V, APAP for fever and myalgias           Hydrate           bland diet           Instructed to call if he cannot keep down food or fluids, if he runs a high fever or has purulent sputum or rhinorrhea             Complete Medication List: 1)  Flonase 50 Mcg/act Susp (Fluticasone propionate) .... As needed 2)  Adult Aspirin Ec Low Strength 81 Mg Tbec (Aspirin) .... Once daily 3)  Taztia Xt 360 Mg Xr24h-cap (Diltiazem hcl er beads) .Marland Kitchen.. 1 by mouth once daily 4)  Lovastatin 20 Mg Tabs (Lovastatin) .... Two tabs in the am 5)  Furosemide 40 Mg Tabs (Furosemide) .Marland Kitchen.. 1 once daily 6)  Benicar 40 Mg Tabs (Olmesartan medoxomil) .Marland Kitchen.. 1 by mouth once daily for blood pressure 7)  Hydrocodone-acetaminophen 5-325 Mg Tabs (Hydrocodone-acetaminophen) .Marland Kitchen.. 1-2 by mouth two times a day as needed pain 8)  Ibuprofen 600 Mg Tabs (Ibuprofen) .Marland Kitchen.. 1 by mouth bid  pc x 1 wk then as needed for  pain 9)  Tamsulosin Hcl 0.4 Mg Caps (Tamsulosin hcl) .Marland Kitchen.. 1 by mouth once daily 10)  Finasteride 5 Mg Tabs (Finasteride) .Marland Kitchen.. 1 by mouth once daily 11)  Promethazine Hcl 12.5 Mg Tabs (Promethazine hcl) .Marland Kitchen.. 1 by mouth q6 as needed n/v  Patient Instructions: 1)  Acute illness-most likiely viral gastroenteritis Plan - phenergan 12.5 mg every 6 hours as needed for nausea. HYDRATE. For developing cough Robitussin DM 1 tsp every 6 hours. For muscle and joint aches tylenol 500mg  2 tablets three times a day on schedule until you are feeling better. Bland diet - soups, mashed potatoes, etc.  2)  If you get fevers, shortness of breath, a cough that produces thick, metallic sputum or nasal drainage or if you cannot keep down fluids - CALL. Prescriptions: PROMETHAZINE HCL 12.5 MG TABS (PROMETHAZINE HCL) 1 by mouth q6 as  needed N/V  #15 x 0   Entered and Authorized by:   Jacques Navy MD   Signed by:   Jacques Navy MD on 12/11/2009   Method used:   Electronically to        [facility] [phone]* (retail)       [address]       [place], [place]  [address]       Ph: [phone]       Fax: [phone]   RxID:   [id number]    Orders Added: 1)  Est. Patient Level III [24401]

## 2010-02-25 NOTE — Assessment & Plan Note (Signed)
Summary: FLU  SHOT--MEN--STC   Nurse Visit   Allergies: No Known Drug Allergies  Orders Added: 1)  Flu Vaccine 74yrs + [90658] 2)  Administration Flu vaccine - MCR [G0008]      Flu Vaccine Consent Questions     Do you have a history of severe allergic reactions to this vaccine? no    Any prior history of allergic reactions to egg and/or gelatin? no    Do you have a sensitivity to the preservative Thimersol? no    Do you have a past history of Guillan-Barre Syndrome? no    Do you currently have an acute febrile illness? no    Have you ever had a severe reaction to latex? no    Vaccine information given and explained to patient? yes    Are you currently pregnant? no    Lot Number:[id number]   Exp Date:07/24/2010   Site Given  Left Deltoid IMu

## 2010-04-20 ENCOUNTER — Other Ambulatory Visit: Payer: Self-pay | Admitting: Internal Medicine

## 2010-04-27 ENCOUNTER — Telehealth: Payer: Self-pay | Admitting: *Deleted

## 2010-04-27 MED ORDER — LOVASTATIN 20 MG PO TABS: 20.0000 mg | ORAL_TABLET | Freq: Two times a day (BID) | ORAL | Status: DC

## 2010-04-27 NOTE — Telephone Encounter (Signed)
refill 

## 2010-06-08 NOTE — Op Note (Signed)
NAME:  KAEL, KEETCH              ACCOUNT NO.:  [id number]   MEDICAL RECORD NO.:  [id number]          PATIENT TYPE:  INP   LOCATION:  5009                         FACILITY:  [facility]   PHYSICIAN:  Dyke Brackett, M.D.    DATE OF BIRTH:  1940/06/04   DATE OF PROCEDURE:  06/22/2007  DATE OF DISCHARGE:                               OPERATIVE REPORT   PREOPERATIVE DIAGNOSIS:  Severe osteoarthritis with valgus deformity,  right knee.   POSTOPERATIVE DIAGNOSIS:  Severe osteoarthritis with valgus deformity,  right knee.   OPERATION:  Right total knee replacement (LCS DePuy cemented knee, large  femur, patella 10-mm bearing with size 5 tibia).   SURGEON:  Dyke Brackett, MD   ASSISTANT:  Joslyn Devon. Smith, PA   TOURNIQUET TIME:  1 hour 30 minutes.   DESCRIPTION OF PROCEDURE:  After sterile prep and drape, exsanguination  of the leg, inflation to 350, straight skin incision with the lateral  fibula to the medial parapatellar approach to the knee made, a cut was  made about 4-5 mm below the most diseased lateral compartment with a 7-  degree posterior slope with the appropriate valgus inclination, followed  by anterior and posterior femoral cut, flexion gap eventually measured  equaled the extension gap at 10-mm.  The distal femoral cut was made  with 4-degree valgus, finishing guide was used on the femur, preceding  this excess menisci and PCL was released.  Attention was next directed  at the tibia, the tibia keel hole was cut, size 5 tibia noted, trial  placed, excellent stability, full extension and some minimally positive  drawer noted with no bearing instability noted.   Patella was cut, reamed about 40-mm of native patella, 3-peg trial, and  trialed and again all parameters of above noted to be excellent.  The  final prosthesis was inserted into the tibia followed by femur, patella  trial bearing was initially used, and placed the real bearing.  Once the  cement was hardened, excess  cement was removed.  The tourniquet was  released after the trial bearing was removed and no excessive bleeding  noted.  Hemostasis was obtained and closure was effective with #1  Ethibond, 2-0 Vicryl, and skin clips.  Hemovac drain was placed exiting  superolaterally in the lateral gutter.  A compressive sterile dressing  applied.  Taken to the recovery room in stable condition.      Dyke Brackett, M.D.  Electronically Signed     WDC/MEDQ  D:  06/22/2007  T:  06/23/2007  Job:  [id number]

## 2010-06-08 NOTE — Discharge Summary (Signed)
NAME:  Jeffrey Carter, Jeffrey Carter              ACCOUNT NO.:  [id number]   MEDICAL RECORD NO.:  [id number]          PATIENT TYPE:  INP   LOCATION:  5009                         FACILITY:  [facility]   PHYSICIAN:  Dyke Brackett, M.D.    DATE OF BIRTH:  [birth year]   DATE OF ADMISSION:  06/22/2007  DATE OF DISCHARGE:  06/26/2007                               DISCHARGE SUMMARY   PREOPERATIVE DIAGNOSIS:  Severe osteoarthritis with valgus deformity of  the right knee.   POSTOPERATIVE DIAGNOSIS:  Severe osteoarthritis with valgus deformity of  the right knee.   DATE OF PROCEDURE:  Jun 22, 2007   PROCEDURE PERFORMED:  Right total knee replacement.   SURGEON:  Dyke Brackett, M.D.   PHYSICIAN'S ASSISTANT:  Sharol Given, PA   PROCEDURE:  The patient underwent on Jun 22, 2007, sterile prep and  drape and underwent a right total knee replacement.  The procedure went  well with no complications.  The patient was then transferred to PACU  stable and transferred from the PACU once stable, up to 5000.   HOSPITAL COURSE:  Jeffrey Carter is a 70 year old male that underwent  right total knee replacement and is transferred to 5000 stably,  following his procedure which was done on Jun 22, 2007.  The patient's  first postop day was Jun 23, 2007, where it was noted that his vitals  were all stable and hemoglobin was 11.4 and hematocrit was 33.7.  His  BMP was all stable within normal limits.  His wound looked clean, dry,  and intact.  Did not have any issues of calf pain, chest pain, trouble  breathing.  Was doing well, a little bit slow with rehab.  Postop day  #2, the patient is still slow in rehab.  Vitals were stable.  He was  afebrile.  The pain was slightly increased, about 5 out of 10.  His  hemoglobin was 10.5.  Hematocrit was 30.6.  So the wound looked good and  range of motion was passive.  It was about 10 to 45 degrees.  Still  working slow at rehab.  No other concerns at that point.  Postop day #3  was  June 25, 2007.  It was noted he had had a lot of increased pain  overnight.  He still had his PCA pump.  His vitals were still all stable  and his hemoglobin was 10.0 and his hematocrit was 29.5.  His BMP was  within normal limits too outside a slightly elevated blood sugar at 128.  The patient is tolerating diet, passing gas, but still had not had a  full bowel movement.  No chest pain or trouble breathing.  We decided to  discharge his PCA pump and his IV and he stayed with oral Percocet for  pain medicine at that point.  Put in plans for rehab consult, but then  determined that since he has Medicare that the rehab unit here would not  be covered by Medicare.  Therefore we started talking about potential  placement in skilled nursing facility due to his wife stating that she  felt like his current state with 2 previous shoulder surgeries and neck  surgery, she would be unable to help him fully at home.   Then came to postop day #4, which was June 26, 2007.  Once again no chest  pain, trouble breathing.  Tolerating diet well.  Still has not had a  bowel movement, but passing gas while his vital signs were all stable.  His hemoglobin was only slightly low at 9.6 and hematocrit 27.1, but  pretty stable, as he had been running around 10 since the surgery for  the past 3 days . It was noted that his potassium was slightly low at  3.3, so we gave him 40 mEq of K-Dur.  Otherwise his BMP was fine.  His  wounds still looked good, clean, and only small amount of serous type  drainage.  Dressing was intact.  He had no pain with palpation of his  calf.  It was soft.  His abdomen was soft.  Lungs and heart were within  normal limits.  The patient had good movement of his foot and function  of anterior tibialis and posterior tibialis, and his right lower  extremity was neurovascularly intact.  Therefore we decided to proceed  for placement in skilled nursing facility.   PLAN:  The patient was  discharged to the skilled nursing facility on  June 26, 2007 in stable condition.  His diet was regular.  His physical  activity was weightbearing as tolerated on his right lower extremity,  with walker.  Plan was since he has been postop for 4 days, is to  continue keeping his staples in for a total of 14 days postop, which  would be another 10 days from June 26, 2007 before they are removed, and  then followup with Dr. Madelon Lips in the office in about 14 days, and call  number [phone] to make an appointment.  Also going to continue his  Lovenox for DVT prophylaxis, which has been 30 mg every 12 hours subcu.  Will continue that all the way through July 07, 2007, would be the last  day of his Lovenox DVT prophylaxis.  Will also continue his passive  motion 8 hours a day, getting 0 to 90.  Can gradually increase at 3-5  degrees per day.   DISCHARGE MEDICATIONS:  1. Lovenox 30 mg subcu injected every 12 hours until July 07, 2007.  2. Colace 100 mg p.o. b.i.d.  3. Furosemide 40 mg 1 tab p.o. daily.  4. Avapro 150 mg tab p.o. daily.  5. Zocor 20 mg tablet p.o. nightly.  6. Diltiazem 360 mg tablet p.o. daily.  7. Dulcolax 5 mg tablet p.o. p.r.n.  8. Senokot S 1 tablet p.o. as needed for constipation.  9. Percocet 5 mg-325 mg 1-2 tablets p.o. q.4-6 h. p.r.n. pain.  10.Acetaminophen 325 mg at 650 mg as needed for temperature greater      than 101 degrees, not too exceed a total of 4000 mg of Tylenol per      day.  11.Reglan 10 mg tablet every 8 hours as needed for nausea.  12.Robaxin 500 mg 1 tablet p.o. q.6 h. as needed for pain, muscle      spasm.  13.Fleet's enema as needed p.r.n. constipation.  14.Dulcolax suppository 10 mg as needed p.r.n.  15.May substitute Benicar 20 mg in place of Avapro 150 mg preferably      if available, as the patient was taking Benicar 20 mg daily at  home, but was placed in the hospital on Avapro 150 mg p.o. daily,      the substitute for Benicar.       Sharol Given, PA       ______________________________  Dyke Brackett, M.D.    JBS/MEDQ  D:  06/26/2007  T:  06/26/2007  Job:  [id number]

## 2010-06-11 NOTE — Assessment & Plan Note (Signed)
[facility]                           PRIMARY CARE OFFICE NOTE   NAME:Jeffrey Carter, Jeffrey Carter                     MRN:          [id number]  DATE:05/30/2006                            DOB:          1940/05/26    HISTORY OF PRESENT ILLNESS:  Jeffrey Carter is a 70 year old gentleman  followed for hypertension and degenerative joint disease who presents  for follow up evaluation and exam.  He was last seen in the office  September 15, 2005, for a sore throat.   The patient reports he is feeling well and doing well with no active  medical problems.   PAST MEDICAL HISTORY:  Well documented in my note of February 21, 2005.   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  His domestic situation remains stable.   REVIEW OF SYSTEMS:  The patient has had a 15 pound weight gain since  August 2007 which is due to overeating.  No other constitutional  problems.  It has been greater than 12 months since his last eye exam,  but he has had no visual problems.  No ENT, cardiovascular, respiratory,  GI or GU complaints.   PHYSICAL EXAMINATION:  VITAL SIGNS:  Temperature 97.6, blood pressure  166/95, pulse 73, weight 227.  GENERAL APPEARANCE:  This is a heavy set African American gentleman in  no acute distress.  HEENT:  Normocephalic, atraumatic.  EAC's and TM's were unremarkable.  Oropharynx:  The patient was missing teeth, but he has no obvious  gingivitis or other problems.  Posterior pharynx was clear.  Conjunctivae and sclerae were clear.  Pupils equal, round and reactive.  NECK:  Supple without thyromegaly.  NODES:  No adenopathy was noted in the cervical or supraclavicular  regions.  CHEST:  No deformities were noted.  LUNGS:  Clear with no wheezes, rales or rhonchi.  CARDIOVASCULAR:  Radial pulses 2+.  No JVD or carotid bruits.  Had a  quite precordium with regular rate and rhythm without murmurs, rubs or  gallops.  ABDOMEN:  Soft, no guarding or rebound.  No organosplenomegaly was  noted.  He is mildly overweight.  GENITALIA:  Normal with bilaterally distended testicles without masses.  RECTAL:  Normal sphincter tone, prostate was smooth, normal size and  contour without nodules or abnormalities.  EXTREMITIES:  Without cyanosis, clubbing, edema or deformity.   LABORATORY DATA:  Cholesterol 177, triglyceride 88, HDL 42.6, LDL was  117.  BUN 13, creatinine 1.1, liver functions were normal.  PSA was  5.07.   ASSESSMENT/PLAN:  1. Hypertension.  The patient's blood pressure was mildly elevated at      today's visit.  The patient is asymptomatic.  He is already taking      Norvasc 10 mg daily, Lasix 40 mg daily, Lisinopril 40 mg daily.      Plan:  Patient to continue his present medical regimen.  I have      asked him to get blood pressure checks at his convenience on an      interval basis.  If he  continues to have consistently elevated      systolic/diastolic pressures, we need to modify his medical      regimen.  2. Lipids.  The patient's LDL is 117, and given his moderate risk for      heart disease, the absence of diabetes, this is an acceptable      level.  However, the patient is encouraged to follow a prudent low      fate diet which will bring his LDL cholesterol down further.  3. BPH with elevated PSA.  The patient's PSA was 5.05 on May 02, 2005, with insignificant change in a year down to 5.07 suspect this      is based on age and enlarged prostate gland.  His exam was nonfocal      with no nodules noted on the prostate.  Plan to continue      observation with repeat PSA in one year.  4. Health maintenance.  The patient is due for colorectal cancer      screening, and we will refer him to GI for the same.   SUMMARY:  This is a pleasant gentleman who does seem medically stable at  this time.  He had a 12-lead electrocardiogram performed today which was  normal by my  reading.  Although, machine reading was concern I guess,  for poor R wave progression in V1 and Q wave borderline significance in  V3.     Rosalyn Gess Norins, MD  Electronically Signed    MEN/MedQ  DD: 05/31/2006  DT: 05/31/2006  Job #: [id number]   cc:   Nicanor Bake

## 2010-07-14 ENCOUNTER — Other Ambulatory Visit: Payer: Self-pay | Admitting: Internal Medicine

## 2010-09-14 ENCOUNTER — Ambulatory Visit (INDEPENDENT_AMBULATORY_CARE_PROVIDER_SITE_OTHER): Payer: Medicare Other | Admitting: Internal Medicine

## 2010-09-14 ENCOUNTER — Encounter: Payer: Self-pay | Admitting: Internal Medicine

## 2010-09-14 DIAGNOSIS — I10 Essential (primary) hypertension: Secondary | ICD-10-CM

## 2010-09-15 NOTE — Assessment & Plan Note (Signed)
BP Readings from Last 3 Encounters:  09/14/10 150/100  12/11/09 142/98  08/06/09 130/90   Upward trend in BP. He is on multiple meds which he claims he takes.  Plan - monitor BP at home BID for 7 days           Report back with recommendations to follow

## 2010-09-15 NOTE — Progress Notes (Signed)
  Subjective:    Patient ID: Jeffrey Carter, male    DOB: 01-28-1940, 70 y.o.   MRN: [id number]  HPI Jeffrey Carter presents for evaluation of persistent sinus congestion, rhinorrhea for two weeks. He denies fever, chills sinus pressure, otalgia, N/V  I have reviewed the patient's medical history in detail and updated the computerized patient record.    Review of Systems System review is negative for any constitutional, cardiac, pulmonary, GI or neuro symptoms or complaints     Objective:   Physical Exam Vitals noted - BP is elevated! Gen'l- obese AA man in no acute distress HEENT - Bilateral cerumen obscuring TMs. No tenderness to percussion over the sinus. Cor - RRR Pulm - no increased WOB, no rales/wheeze. Abomen- obese Nejuro - A&O x 3, nl gait.        Assessment & Plan:  1. Viral URI with congestion - no indication for antibiotics.  Plan- supportive care: claritin for rhinorrhea; nasonex nasal spray x 7 days for relief of congestion (sample)  2. Cerumen - both ears  Plan - patient to do home irrigation with debrox (generic) and bulb syringe.

## 2010-09-16 ENCOUNTER — Telehealth: Payer: Self-pay | Admitting: *Deleted

## 2010-09-16 NOTE — Telephone Encounter (Signed)
Pt walked in office today with BP readings 09-14-10 159/95 09-15-10 AM 160/101 PM 149/81 09-16-10 AM 159/94   PM 152/91  He also is inquiring about weight loss med and TDap because there are small children in the home.  Based on pt's chart his last Td booster was 10/30/2008.

## 2010-09-17 MED ORDER — OLMESARTAN MEDOXOMIL 40 MG PO TABS: 40.0000 mg | ORAL_TABLET | Freq: Every day | ORAL | Status: DC

## 2010-09-17 NOTE — Telephone Encounter (Signed)
1. BP too high. Increase Benicar to 40 mg-may double what he has till gone and new Rx sent in. 2. No  On weight loss pills 3. Tetanus in '10 but if it wasn't Tdap he can have that to cover for pertusis.

## 2010-09-17 NOTE — Telephone Encounter (Signed)
Pt informed of MD's advisement. Rx for Benicar 40mg  sent to pharmacy

## 2010-09-17 NOTE — Telephone Encounter (Signed)
Left message for pt to callback office.  

## 2010-10-20 LAB — URINE CULTURE: Colony Count: 50000

## 2010-10-20 LAB — CBC
HCT: 30.6 — ABNORMAL LOW
Hemoglobin: 10.5 — ABNORMAL LOW
MCHC: 33.9
Platelets: 136 — ABNORMAL LOW
Platelets: 164
Platelets: 208
RDW: 13.3
RDW: 14
WBC: 10.6 — ABNORMAL HIGH
WBC: 5.4

## 2010-10-20 LAB — BASIC METABOLIC PANEL
BUN: 10
CO2: 29
Calcium: 8.3 — ABNORMAL LOW
Calcium: 8.7
Creatinine, Ser: 1.23
GFR calc Af Amer: >60
GFR calc non Af Amer: 59 — ABNORMAL LOW
GFR calc non Af Amer: >60
Glucose, Bld: 115 — ABNORMAL HIGH
Potassium: 3.4 — ABNORMAL LOW
Sodium: 137
Sodium: 142

## 2010-10-20 LAB — URINALYSIS, ROUTINE W REFLEX MICROSCOPIC
Glucose, UA: NEGATIVE
Ketones, ur: NEGATIVE
Nitrite: NEGATIVE
Specific Gravity, Urine: 1.016
pH: 7

## 2010-10-20 LAB — TYPE AND SCREEN
ABO/RH(D): O POS
Antibody Screen: NEGATIVE

## 2010-10-20 LAB — COMPREHENSIVE METABOLIC PANEL
AST: 19
Albumin: 4
Alkaline Phosphatase: 79
Chloride: 105
Creatinine, Ser: 1.07
GFR calc Af Amer: >60
Potassium: 3.7
Total Bilirubin: 0.7
Total Protein: 6.7

## 2010-10-20 LAB — DIFFERENTIAL
Basophils Absolute: 0
Eosinophils Relative: 3
Lymphocytes Relative: 54 — ABNORMAL HIGH
Monocytes Absolute: 0.6
Monocytes Relative: 12

## 2010-10-20 LAB — ABO/RH: ABO/RH(D): O POS

## 2010-10-21 LAB — BASIC METABOLIC PANEL
BUN: 14
BUN: 15
BUN: 9
Calcium: 8.7
Chloride: 101
Chloride: 102
Creatinine, Ser: 1.06
Creatinine, Ser: 1.24
GFR calc Af Amer: >60
GFR calc non Af Amer: >60
GFR calc non Af Amer: >60
Glucose, Bld: 128 — ABNORMAL HIGH
Glucose, Bld: 132 — ABNORMAL HIGH
Potassium: 3.3 — ABNORMAL LOW
Potassium: 3.5
Sodium: 136

## 2010-10-21 LAB — CBC
HCT: 27.1 — ABNORMAL LOW
HCT: 29.5 — ABNORMAL LOW
Hemoglobin: 10 — ABNORMAL LOW
MCHC: 35.3
MCV: 81.6
MCV: 82.5
MCV: 83.3
Platelets: 149 — ABNORMAL LOW
Platelets: 171
Platelets: 209
RBC: 3.26 — ABNORMAL LOW
RDW: 12.9
WBC: 10
WBC: 7.3

## 2010-11-25 ENCOUNTER — Ambulatory Visit (INDEPENDENT_AMBULATORY_CARE_PROVIDER_SITE_OTHER): Payer: Medicare Other | Admitting: *Deleted

## 2010-11-25 DIAGNOSIS — Z23 Encounter for immunization: Secondary | ICD-10-CM

## 2010-11-25 NOTE — Progress Notes (Signed)
Addended by: Merrilyn Puma on: 11/25/2010 02:15 PM   Modules accepted: Orders

## 2011-01-04 ENCOUNTER — Telehealth: Payer: Self-pay | Admitting: Internal Medicine

## 2011-01-04 NOTE — Telephone Encounter (Signed)
Appointment made for tomorrow at 4:00. Patient has been coughing, congested and emesis.

## 2011-01-05 ENCOUNTER — Ambulatory Visit (INDEPENDENT_AMBULATORY_CARE_PROVIDER_SITE_OTHER): Payer: Medicare Other | Admitting: Internal Medicine

## 2011-01-05 VITALS — BP 138/82 | HR 80 | Temp 98.1°F | Wt 233.0 lb

## 2011-01-05 DIAGNOSIS — J069 Acute upper respiratory infection, unspecified: Secondary | ICD-10-CM

## 2011-01-05 MED ORDER — PROMETHAZINE-PHENYLEPHRINE 6.25-5 MG/5ML PO SYRP: 5.0000 mL | ORAL_SOLUTION | ORAL | Status: DC | PRN

## 2011-01-05 MED ORDER — SULFAMETHOXAZOLE-TMP DS 800-160 MG PO TABS: 1.0000 | ORAL_TABLET | Freq: Two times a day (BID) | ORAL | Status: AC

## 2011-01-05 NOTE — Patient Instructions (Signed)
Upper respiratory infection: take septra DS twice a day for 7 days, take codeine cough syrup every 4-6 hours but especially at night so you can sleep, drink plenty of fluids, vitamin C daily, hydrate. Call for fever, difficulty breathing, bloody sputum.

## 2011-01-05 NOTE — Progress Notes (Signed)
  Subjective:    Patient ID: Jeffrey Carter, male    DOB: [birth year], 70 y.o.   MRN: [id number]  HPI Jeffrey Carter presents with a three day h/o URI symptoms: cough with productive sputum, congestion, no fever or regors, no SOB, no N/V. His cough does keep him up at night.  I have reviewed the patient's medical history in detail and updated the computerized patient record.    Review of Systems System review is negative for any constitutional, cardiac, pulmonary, GI or neuro symptoms or complaints other than as described in the HPI.      Objective:   Physical Exam Vitals - no fever Ge n'l- heavy set AA man in no distress HEENT- no sinus tenderness, C&S clear Neck- supple :Pulm - normal respirations, no rales or wheezes Cor- RRR       Assessment & Plan:  URI - w/o complications  Plan - SeptraDS bid x 7, prom/cod, supportive care.

## 2011-01-07 ENCOUNTER — Telehealth: Payer: Self-pay | Admitting: *Deleted

## 2011-01-07 NOTE — Telephone Encounter (Signed)
Pharmacy states they received escript on 12.12.12 for Promethazine VC plain Syrup AND then hand-written Rx received by patient on 12.13.12 for Promethazine w/Codeine syrup. Pt advised by pharmacist not to take both medications at same time; please clarify for pharmacy & patient.

## 2011-01-07 NOTE — Telephone Encounter (Signed)
Pharmacy and patient notified per MD

## 2011-01-07 NOTE — Telephone Encounter (Signed)
Tried to canel the promethazine VC plain but failed. He is to take the prom w/ cod

## 2011-01-28 ENCOUNTER — Other Ambulatory Visit: Payer: Self-pay | Admitting: *Deleted

## 2011-01-28 MED ORDER — LOVASTATIN 20 MG PO TABS: 20.0000 mg | ORAL_TABLET | Freq: Two times a day (BID) | ORAL | Status: DC

## 2011-02-10 ENCOUNTER — Ambulatory Visit (INDEPENDENT_AMBULATORY_CARE_PROVIDER_SITE_OTHER)
Admission: RE | Admit: 2011-02-10 | Discharge: 2011-02-10 | Disposition: A | Discharge status: Home / Self Care | Payer: Medicare Other | Source: Ambulatory Visit | Attending: Internal Medicine | Admitting: Internal Medicine

## 2011-02-10 ENCOUNTER — Encounter: Payer: Self-pay | Admitting: Internal Medicine

## 2011-02-10 ENCOUNTER — Ambulatory Visit (INDEPENDENT_AMBULATORY_CARE_PROVIDER_SITE_OTHER): Payer: Medicare Other | Admitting: Internal Medicine

## 2011-02-10 VITALS — BP 144/92 | HR 80 | Temp 98.2°F

## 2011-02-10 DIAGNOSIS — M25521 Pain in right elbow: Secondary | ICD-10-CM

## 2011-02-10 DIAGNOSIS — M25529 Pain in unspecified elbow: Secondary | ICD-10-CM

## 2011-02-10 DIAGNOSIS — M25429 Effusion, unspecified elbow: Secondary | ICD-10-CM

## 2011-02-10 MED ORDER — HYDROCODONE-ACETAMINOPHEN 5-500 MG PO TABS: 1.0000 | ORAL_TABLET | Freq: Three times a day (TID) | ORAL | Status: AC | PRN

## 2011-02-10 MED ORDER — DICLOFENAC SODIUM 75 MG PO TBEC: 75.0000 mg | DELAYED_RELEASE_TABLET | Freq: Two times a day (BID) | ORAL | Status: DC

## 2011-02-10 NOTE — Progress Notes (Signed)
  Subjective:    Patient ID: Jeffrey Carter, male    DOB: [birth year], 71 y.o.   MRN: [id number]  HPI  complains of pain near R elbow Onset 3 hours ago while rolling up sleeves to wash AM dishes Felt "tight", not "pop", near elbow followed by pain and "lump" No fever, no direct trauma Pain is 5/10 No numbness or radiation of pain  Past Medical History  Diagnosis Date  . Hypertension   . Osteoarthritis   . Back pain   . Hyperlipidemia     Review of Systems  Respiratory: Negative for cough and shortness of breath.   Musculoskeletal: Negative for myalgias and joint swelling.  Skin: Negative for pallor, rash and wound.       Objective:   Physical Exam BP 144/92  Pulse 80  Temp(Src) 98.2 F (36.8 C) (Oral)  SpO2 96% Gen: NAD Lung: CTA B CV: RRR MSkel: RUE with obvious soft tissue deformity/ firm swelling over supinator region and wrist extensors near radial head - no elbow effusion or swelling at olecranon  - FROM elbow, writs and digits with painful wrist extension, hand pronation and elbow flexion -  Sskin: no bruising, redness or laceration/trauma to RUE  Lab Results  Component Value Date   WBC 7.3 06/27/2007   HGB 9.2* 06/27/2007   HCT 26.6* 06/27/2007   PLT 209 06/27/2007   GLUCOSE 99 03/27/2009   CHOL 153 01/14/2008   TRIG 69 01/14/2008   HDL 46.0 01/14/2008   LDLCALC 93 01/14/2008   ALT 22 01/14/2008   AST 21 01/14/2008   NA 141 03/27/2009   K 3.7 03/27/2009   CL 103 03/27/2009   CREATININE 1.1 03/27/2009   BUN 11 03/27/2009   CO2 32 03/27/2009   PSA 5.07* 05/30/2006   INR 0.9 06/15/2007   Dg Elbow Complete Right  02/10/2011  *RADIOLOGY REPORT*  Clinical Data: Right elbow pain and swelling laterally.  RIGHT ELBOW - COMPLETE 3+ VIEW  Comparison: None.  Findings: The oblique view demonstrates faint focal lucency in the capitellum, possibly from a non-fragmented osteochondral injury.  There is mild spurring of the coronoid process and of the olecranon.  No fracture or elbow  effusion is observed.  IMPRESSION:  1.  Mild degenerative spurring. 2.  Capitellar lucency on one view could represent a non-fragmented osteochondral injury.  This could be further characterized with MRI if clinically warranted.  Original Report Authenticated By: Dellia Cloud, M.D.       Assessment & Plan:  R elbow/forearm pain and swelling - acute onset this AM - no trauma ? Muscle/tendon of wrist extensor rupture -  Check complete elbow film - rule out fx or dislocation (see report above) Vicodin prn, voltaren bid, ice and rest  Refer to ortho ([facility] - has seen same in past)

## 2011-02-10 NOTE — Patient Instructions (Signed)
It was good to see you today. Test(s) ordered today. Your results will be called to you after review (48-72hours after test completion). If any changes need to be made, you will be notified at that time. Use voltaren 2x/day with food x 2 weeks - also vicodin as needed fro severe pain symptoms - Ice to painful area and rest for next 72h - Will call about appointment with Dr. Madelon Lips as needed

## 2011-03-07 ENCOUNTER — Other Ambulatory Visit: Payer: Self-pay | Admitting: Internal Medicine

## 2011-03-14 ENCOUNTER — Other Ambulatory Visit: Payer: Self-pay | Admitting: Internal Medicine

## 2011-03-18 ENCOUNTER — Other Ambulatory Visit: Payer: Self-pay | Admitting: Internal Medicine

## 2011-04-12 ENCOUNTER — Other Ambulatory Visit: Payer: Self-pay | Admitting: Internal Medicine

## 2011-05-14 ENCOUNTER — Other Ambulatory Visit: Payer: Self-pay | Admitting: Internal Medicine

## 2011-05-31 ENCOUNTER — Ambulatory Visit (INDEPENDENT_AMBULATORY_CARE_PROVIDER_SITE_OTHER): Payer: Medicare Other | Admitting: Internal Medicine

## 2011-05-31 ENCOUNTER — Encounter: Payer: Self-pay | Admitting: Internal Medicine

## 2011-05-31 ENCOUNTER — Other Ambulatory Visit (INDEPENDENT_AMBULATORY_CARE_PROVIDER_SITE_OTHER): Payer: Medicare Other

## 2011-05-31 VITALS — BP 152/90 | HR 85 | Temp 97.6°F | Resp 16 | Wt 232.0 lb

## 2011-05-31 DIAGNOSIS — I10 Essential (primary) hypertension: Secondary | ICD-10-CM

## 2011-05-31 DIAGNOSIS — E785 Hyperlipidemia, unspecified: Secondary | ICD-10-CM

## 2011-05-31 DIAGNOSIS — Z0000 Encounter for general adult medical examination without abnormal findings: Secondary | ICD-10-CM

## 2011-05-31 DIAGNOSIS — N401 Enlarged prostate with lower urinary tract symptoms: Secondary | ICD-10-CM

## 2011-05-31 LAB — HEPATIC FUNCTION PANEL
Alkaline Phosphatase: 84 U/L (ref 39–117)
Bilirubin, Direct: 0.1 mg/dL (ref 0.0–0.3)
Total Bilirubin: 0.4 mg/dL (ref 0.3–1.2)
Total Protein: 7 g/dL (ref 6.0–8.3)

## 2011-05-31 LAB — LIPID PANEL
Cholesterol: 150 mg/dL (ref 0–200)
LDL Cholesterol: 74 mg/dL (ref 0–99)
Triglycerides: 149 mg/dL (ref 0.0–149.0)
VLDL: 29.8 mg/dL (ref 0.0–40.0)

## 2011-05-31 LAB — COMPREHENSIVE METABOLIC PANEL
AST: 16 U/L (ref 0–37)
Albumin: 3.8 g/dL (ref 3.5–5.2)
Alkaline Phosphatase: 84 U/L (ref 39–117)
BUN: 14 mg/dL (ref 6–23)
Calcium: 8.9 mg/dL (ref 8.4–10.5)
Chloride: 104 mEq/L (ref 96–112)
Creatinine, Ser: 1.2 mg/dL (ref 0.4–1.5)
Glucose, Bld: 96 mg/dL (ref 70–99)
Potassium: 3.6 mEq/L (ref 3.5–5.1)

## 2011-05-31 MED ORDER — FUROSEMIDE 40 MG PO TABS: 40.0000 mg | ORAL_TABLET | Freq: Every day | ORAL | Status: DC

## 2011-05-31 MED ORDER — OLMESARTAN MEDOXOMIL 40 MG PO TABS: 40.0000 mg | ORAL_TABLET | Freq: Every day | ORAL | Status: DC

## 2011-05-31 MED ORDER — DILTIAZEM HCL ER BEADS 360 MG PO CP24: 360.0000 mg | ORAL_CAPSULE | Freq: Every day | ORAL | Status: DC

## 2011-05-31 MED ORDER — LOVASTATIN 20 MG PO TABS: 20.0000 mg | ORAL_TABLET | Freq: Two times a day (BID) | ORAL | Status: DC

## 2011-05-31 NOTE — Progress Notes (Signed)
Subjective:    Patient ID: Jeffrey Carter, male    DOB: 1940-10-07, 71 y.o.   MRN: [id number]  HPI The patient is here for annual Medicare wellness examination and management of other chronic and acute problems. He has been feeling well; no major illness, no injury. He did have releast of trigger finger 3rd digit left hand.    The risk factors are reflected in the social history.  The roster of all physicians providing medical care to patient - is listed in the Snapshot section of the chart.  Activities of daily living:  The patient is 100% inedpendent in all ADLs: dressing, toileting, feeding as well as independent mobility  Home safety : The patient has smoke detectors in the home. Fall - house has been made fall safe.  They wear seatbelts. No firearms at home  There is no risks for hepatitis, STDs or HIV. There is no   history of blood transfusion. They have no travel history to infectious disease endemic areas of the world.  The patient has not seen their dentist in the last six month. They have seen their eye doctor in the last year. They deny any hearing difficulty and have not had audiologic testing in the last year.  They do not  have excessive sun exposure. Discussed the need for sun protection: hats, long sleeves and use of sunscreen if there is significant sun exposure.   Diet: the importance of a healthy diet is discussed. They do have a healthy diet.  The patient has no regular exercise program.  The benefits of regular aerobic exercise were discussed.  Depression screen: there are no signs or vegative symptoms of depression- irritability, change in appetite, anhedonia, sadness/tearfullness.  Cognitive assessment: the patient manages all their financial and personal affairs and is actively engaged. They could relate day,date,year and events; recalled 3/3 objects at 3 minutes; performed clock-face test normally.  The following portions of the patient's history were reviewed  and updated as appropriate: allergies, current medications, past family history, past medical history,  past surgical history, past social history  and problem list.  Vision, hearing, body mass index were assessed and reviewed.   During the course of the visit the patient was educated and counseled about appropriate screening and preventive services including : fall prevention , diabetes screening, nutrition counseling, colorectal cancer screening, and recommended immunizations.  Past Medical History  Diagnosis Date  . Hypertension   . Osteoarthritis   . Back pain   . Hyperlipidemia    Past Surgical History  Procedure Date  . Appendectomy   . Tonsillectomy   . Total knee arthroplasty     Right 2009   No family history on file. History   Social History  . Marital Status: Married    Spouse Name: N/A    Number of Children: N/A  . Years of Education: N/A   Occupational History  . Not on file.   Social History Main Topics  . Smoking status: Never Smoker   . Smokeless tobacco: Not on file  . Alcohol Use: Not on file  . Drug Use: Not on file  . Sexually Active: Not on file   Other Topics Concern  . 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  HEENT:  Negative for hearing loss, ear pain, congestion, neck stiffness and postnasal drip. Negative for sore throat or swallowing problems. Negative for dental complaints.   Eyes: Negative for vision loss or change in visual acuity.  Respiratory: Negative for chest tightness and wheezing. Negative for DOE.   Cardiovascular: Negative for chest pain or palpitations. No decreased exercise tolerance Gastrointestinal: No change in bowel habit. No bloating or gas. No reflux or  indigestion Genitourinary: Negative for urgency, frequency, flank pain and difficulty urinating. Nocturia x 3 but he takes lasix at night Musculoskeletal: Negative for myalgias, back pain, arthralgias and gait problem.  Neurological: Negative for dizziness, tremors, weakness and headaches.  Hematological: Negative for adenopathy.  Psychiatric/Behavioral: Negative for behavioral problems and dysphoric mood.       Objective:   Physical Exam Filed Vitals:   05/31/11 1111  BP: 152/90  Pulse: 85  Temp: 97.6 F (36.4 C)  Resp: 16   Wt Readings from Last 3 Encounters:  05/31/11 232 lb (105.235 kg)  01/05/11 233 lb (105.688 kg)  09/14/10 232 lb (105.235 kg)   Gen'l- overweight but solid AA man in no distress HEENT- C&S clear, PERRLA, fundiscopic exam deferred to ophthalmology; oropharynx - missing many teeth, some gingival hyperplasia, no lesions buccal membranes or palate Neck - supple, no thyromegaly Nodes - negative Cor 2+ radial and DP pulses, RRR, no murmur Pulm - normal respirations, Lungs - CTAP \\Abd - BS+, no HSM, no guarding or rebound Genitalia - deferred Ext - no C/C/E, no deformity, normal motion about small, medium and large joints. Neuro - CN II-XII normal, normal gait and station, normal mental status. Derm - clear   Lab Results  Component Value Date   WBC 7.3 06/27/2007   HGB 9.2* 06/27/2007   HCT 26.6* 06/27/2007   PLT 209 06/27/2007   GLUCOSE 96 05/31/2011   CHOL 150 05/31/2011   TRIG 149.0 05/31/2011   HDL 46.50 05/31/2011   LDLCALC 74 05/31/2011        ALT 18 05/31/2011   AST 16 05/31/2011        NA 143 05/31/2011   K 3.6 05/31/2011   CL 104 05/31/2011   CREATININE 1.2 05/31/2011   BUN 14 05/31/2011   CO2 28 05/31/2011   PSA 5.07* 05/30/2006   INR 0.9 06/15/2007        Assessment & Plan:

## 2011-06-02 ENCOUNTER — Encounter: Payer: Self-pay | Admitting: Internal Medicine

## 2011-06-02 DIAGNOSIS — Z0000 Encounter for general adult medical examination without abnormal findings: Secondary | ICD-10-CM | POA: Insufficient documentation

## 2011-06-02 NOTE — Assessment & Plan Note (Signed)
Interval medical history is negative. Physical exam is normal. Lab results are in normal range. He is current with colorectal cancer screening. Immunizations are up to date except he is due for shingles vaccine. He has retired and stays busy but does not have a regular exercise program which is advised for weight control and blood pressure management.  In summary - a nice man who is medically stable except for his blood pressure being above goal. He will return as needed; report back on BP readings.

## 2011-06-02 NOTE — Assessment & Plan Note (Signed)
Stable w/ no complaint of frequency, nocturia or urgency

## 2011-06-02 NOTE — Assessment & Plan Note (Signed)
Excellent control with LDL cholesterol better than goal.  Plan Continue present medication

## 2011-06-02 NOTE — Assessment & Plan Note (Signed)
BP Readings from Last 3 Encounters:  05/31/11 152/90  02/10/11 144/92  01/05/11 138/82   Borderline to sub-optimal control on three agents.  Plan Monitor Blood pressure at home. Report back if SBP running 140+ on a regular basis. Next step would be to add a beta-blocker.

## 2011-11-03 ENCOUNTER — Ambulatory Visit (INDEPENDENT_AMBULATORY_CARE_PROVIDER_SITE_OTHER): Payer: Medicare Other | Admitting: General Practice

## 2011-11-03 DIAGNOSIS — Z23 Encounter for immunization: Secondary | ICD-10-CM

## 2011-11-29 ENCOUNTER — Emergency Department (HOSPITAL_COMMUNITY)
Admission: EM | Admit: 2011-11-29 | Discharge: 2011-11-29 | Discharge status: Against Medical Advice | Payer: Medicare Other

## 2011-11-29 ENCOUNTER — Encounter (HOSPITAL_COMMUNITY): Payer: Self-pay | Admitting: Emergency Medicine

## 2011-11-29 ENCOUNTER — Emergency Department (HOSPITAL_COMMUNITY)
Admission: EM | Admit: 2011-11-29 | Discharge: 2011-11-29 | Disposition: A | Discharge status: Home / Self Care | Payer: Medicare Other | Attending: Emergency Medicine | Admitting: Emergency Medicine

## 2011-11-29 DIAGNOSIS — J069 Acute upper respiratory infection, unspecified: Secondary | ICD-10-CM | POA: Insufficient documentation

## 2011-11-29 DIAGNOSIS — Z79899 Other long term (current) drug therapy: Secondary | ICD-10-CM | POA: Insufficient documentation

## 2011-11-29 DIAGNOSIS — R590 Localized enlarged lymph nodes: Secondary | ICD-10-CM

## 2011-11-29 DIAGNOSIS — R599 Enlarged lymph nodes, unspecified: Secondary | ICD-10-CM | POA: Insufficient documentation

## 2011-11-29 DIAGNOSIS — E785 Hyperlipidemia, unspecified: Secondary | ICD-10-CM | POA: Insufficient documentation

## 2011-11-29 DIAGNOSIS — Z7982 Long term (current) use of aspirin: Secondary | ICD-10-CM | POA: Insufficient documentation

## 2011-11-29 DIAGNOSIS — M542 Cervicalgia: Secondary | ICD-10-CM | POA: Insufficient documentation

## 2011-11-29 DIAGNOSIS — R059 Cough, unspecified: Secondary | ICD-10-CM | POA: Insufficient documentation

## 2011-11-29 DIAGNOSIS — J3489 Other specified disorders of nose and nasal sinuses: Secondary | ICD-10-CM | POA: Insufficient documentation

## 2011-11-29 DIAGNOSIS — I10 Essential (primary) hypertension: Secondary | ICD-10-CM | POA: Insufficient documentation

## 2011-11-29 DIAGNOSIS — R05 Cough: Secondary | ICD-10-CM | POA: Insufficient documentation

## 2011-11-29 DIAGNOSIS — Z8739 Personal history of other diseases of the musculoskeletal system and connective tissue: Secondary | ICD-10-CM | POA: Insufficient documentation

## 2011-11-29 DIAGNOSIS — M1990 Unspecified osteoarthritis, unspecified site: Secondary | ICD-10-CM | POA: Insufficient documentation

## 2011-11-29 MED ORDER — TRAMADOL HCL 50 MG PO TABS: 50.0000 mg | ORAL_TABLET | Freq: Four times a day (QID) | ORAL | Status: DC | PRN

## 2011-11-29 MED ORDER — OXYMETAZOLINE HCL 0.05 % NA SOLN: 2.0000 | Freq: Two times a day (BID) | NASAL | Status: DC

## 2011-11-29 MED ORDER — DEXAMETHASONE SODIUM PHOSPHATE 10 MG/ML IJ SOLN
10.0000 mg | Freq: Once | INTRAMUSCULAR | Status: AC
  Administered 2011-11-29: 10 mg via INTRAMUSCULAR
  Filled 2011-11-29: qty 1

## 2011-11-29 NOTE — ED Provider Notes (Signed)
History     CSN: [id number]  Arrival date & time 11/29/11  2020   First MD Initiated Contact with Patient 11/29/11 2233      Chief Complaint  Patient presents with  . Sore Throat    (Consider location/radiation/quality/duration/timing/severity/associated sxs/prior treatment) HPI Comments: Patient reports 4 days of nasal congestion, scratchy throat, cough.  Tonight pt developed sharp pains in the left side of his neck and was worried so he came to get checked out.  Cough became productive or yellow sputum today.  Denies fevers, SOB, N/V, chest pain.  No known sick contacts.    The history is provided by the patient and the spouse.    Past Medical History  Diagnosis Date  . Hypertension   . Osteoarthritis   . Back pain   . Hyperlipidemia     Past Surgical History  Procedure Date  . Appendectomy   . Tonsillectomy   . Total knee arthroplasty     Right 2009    No family history on file.  History  Substance Use Topics  . Smoking status: Never Smoker   . Smokeless tobacco: Never Used  . Alcohol Use: No      Review of Systems  Constitutional: Negative for fever and chills.  HENT: Positive for congestion, sore throat and rhinorrhea. Negative for ear pain and trouble swallowing.   Respiratory: Positive for cough. Negative for shortness of breath.   Cardiovascular: Negative for chest pain.  Gastrointestinal: Negative for nausea and vomiting.    Allergies  Review of patient's allergies indicates no known allergies.  Home Medications   Current Outpatient Rx  Name  Route  Sig  Dispense  Refill  . ASPIRIN 81 MG PO TABS   Oral   Take 81 mg by mouth daily.           Marland Kitchen DILTIAZEM HCL ER BEADS 360 MG PO CP24   Oral   Take 1 capsule (360 mg total) by mouth daily.   30 capsule   11   . FUROSEMIDE 40 MG PO TABS   Oral   Take 1 tablet (40 mg total) by mouth daily.   30 tablet   11   . LOVASTATIN 20 MG PO TABS   Oral   Take 1 tablet (20 mg total) by mouth 2  (two) times daily at 10 AM and 5 PM.   60 tablet   8   . OLMESARTAN MEDOXOMIL 40 MG PO TABS   Oral   Take 1 tablet (40 mg total) by mouth daily.   30 tablet   11     BP 155/87  Pulse 74  Temp 98 F (36.7 C)  Resp 16  SpO2 98%  Physical Exam  Nursing note and vitals reviewed. Constitutional: He appears well-developed and well-nourished. No distress.  HENT:  Head: Normocephalic and atraumatic.  Nose: Mucosal edema and rhinorrhea present. Right sinus exhibits no maxillary sinus tenderness and no frontal sinus tenderness. Left sinus exhibits no maxillary sinus tenderness and no frontal sinus tenderness.  Mouth/Throat: Uvula is midline and oropharynx is clear and moist. Mucous membranes are not dry. No uvula swelling. No oropharyngeal exudate, posterior oropharyngeal edema, posterior oropharyngeal erythema or tonsillar abscesses.  Neck: Neck supple.  Cardiovascular: Normal rate and regular rhythm.   Pulmonary/Chest: Effort normal and breath sounds normal. No stridor. No respiratory distress. He has no wheezes. He has no rales.  Lymphadenopathy:    He has cervical adenopathy.  Neurological: He is alert.  Skin:  He is not diaphoretic.    ED Course  Procedures (including critical care time)   Labs Reviewed  RAPID STREP SCREEN   No results found.  11:00 PM Pt discussed with, also seen and examined by Dr Jeraldine Loots.   1. URI (upper respiratory infection)   2. Cervical lymphadenopathy     MDM  Pt with 4 days of upper respiratory symptoms with a few hours of sharp pains in his left throat/neck.  Likely from cervical lymphadenopathy.  Pt also seen by Dr Jeraldine Loots who recommends decadron in addition to pain medication and decongestant at home.  Pt d/c home with afrin nasal spray, ultram.  Discussed diagnosis with patient.  Plan also discussed with patient by Dr Jeraldine Loots.  Pt verbalizes understanding and agrees with plan.  Pt given return precautions.        Brooks,  [place] 11/30/11 0008

## 2011-11-29 NOTE — ED Notes (Signed)
Pt alert, nad, arrives from home, c/o sore throat, onset was today, states recent URI, resp even unlabored, skin pwd, denies recent ill exposures

## 2011-11-30 NOTE — ED Provider Notes (Signed)
Medical screening examination/treatment/procedure(s) were performed by non-physician practitioner and as supervising physician I was immediately available for consultation/collaboration.  Athanasia Stanwood, MD 11/30/11 1736 

## 2011-12-28 ENCOUNTER — Ambulatory Visit (INDEPENDENT_AMBULATORY_CARE_PROVIDER_SITE_OTHER): Payer: Medicare Other | Admitting: Internal Medicine

## 2011-12-28 ENCOUNTER — Other Ambulatory Visit (INDEPENDENT_AMBULATORY_CARE_PROVIDER_SITE_OTHER): Payer: Medicare Other

## 2011-12-28 ENCOUNTER — Encounter: Payer: Self-pay | Admitting: Internal Medicine

## 2011-12-28 VITALS — BP 162/100 | HR 77 | Temp 97.6°F | Resp 10 | Ht 69.0 in | Wt 235.1 lb

## 2011-12-28 DIAGNOSIS — G4730 Sleep apnea, unspecified: Secondary | ICD-10-CM

## 2011-12-28 DIAGNOSIS — N401 Enlarged prostate with lower urinary tract symptoms: Secondary | ICD-10-CM

## 2011-12-28 DIAGNOSIS — Z0000 Encounter for general adult medical examination without abnormal findings: Secondary | ICD-10-CM

## 2011-12-28 DIAGNOSIS — N138 Other obstructive and reflux uropathy: Secondary | ICD-10-CM

## 2011-12-28 DIAGNOSIS — I10 Essential (primary) hypertension: Secondary | ICD-10-CM

## 2011-12-28 DIAGNOSIS — E785 Hyperlipidemia, unspecified: Secondary | ICD-10-CM

## 2011-12-28 LAB — LIPID PANEL
HDL: 45.2 mg/dL (ref >39.00)
LDL Cholesterol: 86 mg/dL (ref 0–99)
Total CHOL/HDL Ratio: 3
Triglycerides: 70 mg/dL (ref 0.0–149.0)

## 2011-12-28 LAB — COMPREHENSIVE METABOLIC PANEL
ALT: 25 U/L (ref 0–53)
AST: 19 U/L (ref 0–37)
Chloride: 105 mEq/L (ref 96–112)
Creatinine, Ser: 1.1 mg/dL (ref 0.4–1.5)
Sodium: 142 mEq/L (ref 135–145)
Total Bilirubin: 0.5 mg/dL (ref 0.3–1.2)
Total Protein: 7.1 g/dL (ref 6.0–8.3)

## 2011-12-28 LAB — HEPATIC FUNCTION PANEL
AST: 19 U/L (ref 0–37)
Albumin: 4 g/dL (ref 3.5–5.2)
Alkaline Phosphatase: 88 U/L (ref 39–117)
Total Protein: 7.1 g/dL (ref 6.0–8.3)

## 2011-12-28 MED ORDER — ATENOLOL 50 MG PO TABS: 50.0000 mg | ORAL_TABLET | Freq: Every day | ORAL | Status: DC

## 2011-12-28 NOTE — Patient Instructions (Addendum)
Pain in the right side just above the pelvic edge with cough - no hernia. Suspect you have an acutely pulled muscle. Plan Heat, rub of choice, e.g. Icy-hot, BenGay, etc  Night urination - try samples of Rapaflo once a day at bedtime to see if this helps. If it does let me know and a generic Rx will be sent in for you.  Blood pressure is too high. Continue your present medications but add Atenolol 50 mg once a day (Rx to drug store)  For possible sleep apnea - will arrange for at home overnight testing to see if you have sleep apnea.   Routine lab today.  Rest of your exam is OK  For Shingles vaccine - have Bonita Quin call and find out if the vaccine is covered by [facility].   A full report will follow.

## 2011-12-28 NOTE — Progress Notes (Signed)
Subjective:    Patient ID: Jeffrey Carter, male    DOB: February 21, 1940, 71 y.o.   MRN: [id number]  HPI The patient is here for annual Medicare wellness examination and management of other chronic and acute problems. Feels good. Has not had any major illness, surgery or injury. He does have a sharp pain at the RLQ above the inguinal canal with cough or sneeze. Wife reports snoring and breath holding at night.    The risk factors are reflected in the social history.  The roster of all physicians providing medical care to patient - is listed in the Snapshot section of the chart.  Activities of daily living:  The patient is 100% inedpendent in all ADLs: dressing, toileting, feeding as well as independent mobility  Home safety : The patient has smoke detectors in the home. Fall - none. They wear seatbelts. No firearms at home. There is no violence in the home.   There is no risks for hepatitis, STDs or HIV. There is no   history of blood transfusion. They have no travel history to infectious disease endemic areas of the world.  The patient has not seen their dentist in the last six month. They have  seen their eye doctor in the last year. They deny any hearing difficulty and have not had audiologic testing in the last year.    They do not  have excessive sun exposure. Discussed the need for sun protection: hats, long sleeves and use of sunscreen if there is significant sun exposure.   Diet: the importance of a healthy diet is discussed. They do have a healthy diet.  The patient has no regular exercise program.  The benefits of regular aerobic exercise were discussed.  Depression screen: there are no signs or vegative symptoms of depression- irritability, change in appetite, anhedonia, sadness/tearfullness.  Cognitive assessment: the patient manages all their financial and personal affairs and is actively engaged.   Past Medical History  Diagnosis Date  . Hypertension   . Osteoarthritis   .  Back pain   . Hyperlipidemia    Past Surgical History  Procedure Date  . Appendectomy   . Tonsillectomy   . Total knee arthroplasty     Right 2009   History reviewed. No pertinent family history. History   Social History  . Marital Status: Married    Spouse Name: N/A    Number of Children: N/A  . Years of Education: N/A   Occupational History  . Not on file.   Social History Main Topics  . Smoking status: Never Smoker   . Smokeless tobacco: Never Used  . Alcohol Use: No  . Drug Use: No  . Sexually Active: Yes -- Male partner(s)   Other Topics Concern  . 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 aspirin 81 MG tablet Take 81 mg by mouth daily.        . cetirizine (ZYRTEC) 10 MG tablet Take 10 mg by mouth daily as needed. For allergies      . dextromethorphan-guaiFENesin (ROBITUSSIN-DM) 10-100 MG/5ML liquid Take 5 mLs by mouth every 4 (four) hours as needed. For cold      . diltiazem (TAZTIA XT) 360 MG 24 hr capsule Take 1 capsule (360 mg total) by  mouth daily.  30 capsule  11  . furosemide (LASIX) 40 MG tablet Take 1 tablet (40 mg total) by mouth daily.  30 tablet  11  . lovastatin (MEVACOR) 20 MG tablet Take 1 tablet (20 mg total) by mouth 2 (two) times daily at 10 AM and 5 PM.  60 tablet  8  . olmesartan (BENICAR) 40 MG tablet Take 1 tablet (40 mg total) by mouth daily.  30 tablet  11  . oxymetazoline (AFRIN NASAL SPRAY) 0.05 % nasal spray Place 2 sprays into the nose 2 (two) times daily.  30 mL  0  . aspirin-sod bicarb-citric acid (ALKA-SELTZER) 325 MG TBEF Take 325 mg by mouth every 6 (six) hours as needed. For cold symptoms      . naproxen sodium (ANAPROX) 220 MG tablet Take 220 mg by mouth 2 (two) times daily as needed. For pain      . traMADol  (ULTRAM) 50 MG tablet Take 1 tablet (50 mg total) by mouth every 6 (six) hours as needed for pain.  15 tablet  0     Vision, hearing, body mass index were assessed and reviewed.   During the course of the visit the patient was educated and counseled about appropriate screening and preventive services including : fall prevention , diabetes screening, nutrition counseling, colorectal cancer screening, and recommended immunizations.    Review of Systems Constitutional:  Negative for fever, chills, activity change and unexpected weight change.  HEENT:  Negative for hearing loss, ear pain, congestion, neck stiffness and postnasal drip. Negative for sore throat or swallowing problems. Negative for dental complaints.   Eyes: Negative for vision loss or change in visual acuity.  Respiratory: Negative for chest tightness and wheezing. Negative for DOE.   Cardiovascular: Negative for chest pain or palpitations. No decreased exercise tolerance Gastrointestinal: No change in bowel habit. No bloating or gas. No reflux or indigestion Genitourinary: Negative for urgency, frequency, flank pain and difficulty urinating. Nocturia 3-4 Musculoskeletal: Negative for myalgias, back pain, arthralgias and gait problem.  Neurological: Negative for dizziness, tremors, weakness and headaches.  Hematological: Negative for adenopathy.  Psychiatric/Behavioral: Negative for behavioral problems and dysphoric mood.       Objective:   Physical Exam Filed Vitals:   12/28/11 1433  BP: 162/100  Pulse: 77  Temp: 97.6 F (36.4 C)  Resp: 10   Wt Readings from Last 3 Encounters:  12/28/11 235 lb 1.3 oz (106.632 kg)  05/31/11 232 lb (105.235 kg)  01/05/11 233 lb (105.688 kg)   Gen'l: Well nourished well developed AA male in no acute distress  HEENT: Head: Normocephalic and atraumatic. Right Ear: External ear normal with scant cerumen. EAC/TM nl. Left Ear: External ear normal with some cerumen.  EAC/TM nl. Nose: Nose  normal. Mouth/Throat: Oropharynx is clear and moist. Dentition - native, missing molars, premolars, other teeth. Remaining teeth look ok.  No buccal or palatal lesions. Posterior pharynx clear. Eyes: Conjunctivae and sclera clear. EOM intact. Pupils are equal, round, and reactive to light. Right eye exhibits no discharge. Left eye exhibits no discharge. Neck: Normal range of motion. Neck supple. No JVD present. No tracheal deviation present. No thyromegaly present.  Cardiovascular: Normal rate, regular rhythm, no gallop, no friction rub, no murmur heard.      Quiet precordium. 2+ radial and DP pulses . No carotid bruits Pulmonary/Chest: Effort normal. No respiratory distress or increased WOB, no wheezes, no rales. No chest wall deformity or CVAT. Abdomen: Soft. Bowel sounds are normal  in all quadrants. He exhibits no distension, no tenderness, no rebound or guarding, No heptosplenomegaly. Very tender to deep palpation and with cough at the RLQ above the iliac crest  Genitourinary:  deferred Musculoskeletal: Normal range of motion. He exhibits no edema and no tenderness.       Small and large joints without redness, synovial thickening or deformity. Full range of motion preserved about all small, median and large joints.  Lymphadenopathy:    He has no cervical or supraclavicular adenopathy.  Neurological: He is alert and oriented to person, place, and time. CN II-XII intact. DTRs 2+ and symmetrical biceps, radial and patellar tendons. Cerebellar function normal with no tremor, rigidity, normal gait and station.  Skin: Skin is warm and dry. No rash noted. No erythema.  Psychiatric: He has a normal mood and affect. His behavior is normal. Thought content normal.   Lab Results  Component Value Date   WBC 7.3 06/27/2007   HGB 9.2* 06/27/2007   HCT 26.6* 06/27/2007   PLT 209 06/27/2007   GLUCOSE 107* 12/28/2011   CHOL 145 12/28/2011   TRIG 70.0 12/28/2011   HDL 45.20 12/28/2011   LDLCALC 86 12/28/2011   ALT  25 12/28/2011   ALT 25 12/28/2011   AST 19 12/28/2011   AST 19 12/28/2011   NA 142 12/28/2011   K 3.9 12/28/2011   CL 105 12/28/2011   CREATININE 1.1 12/28/2011   BUN 14 12/28/2011   CO2 30 12/28/2011   PSA 5.07* 05/30/2006   INR 0.9 06/15/2007         Assessment & Plan:  Abdominal pain - location, duration and tenderness to palpation c/w  Pulled muscle.  Plan Lineament of choice  Avoid strain  For persistent pain with need re-evaluation.

## 2012-01-01 NOTE — Assessment & Plan Note (Addendum)
BP Readings from Last 3 Encounters:  12/28/11 162/100  11/29/11 155/87  05/31/11 152/90   Poor control.  Plan Continue present medications  Add beta-blocker  Follow-up BP check 2-4 weeks

## 2012-01-01 NOTE — Assessment & Plan Note (Signed)
Lab reveals good control with LDL less than 100.  Plan -  Continue present medications.

## 2012-01-01 NOTE — Assessment & Plan Note (Signed)
Interval history significant for 3-4 days of right Lower quadrant abdominal pain otherwise unremarkable. Physical exam notable for obesity, tenderness at right abdomen. Lab results are in normal range. He is current with colorectal cancer screening Discussed pros and cons of prostate cancer screening (USPHCTF recommendations reviewed and [facility] April '13 recommendations) and he defers evaluation at this time. Immunizations current except for shingles vaccine. He will check about insurance coverage.   In summary - a nice man who is medically stable at this time. He is encouraged to loose weight and to exercise on a regular basis.

## 2012-01-01 NOTE — Assessment & Plan Note (Signed)
Patient reports nocturia 3-5 times a night - disrupting sleep  Plan Trial of rapaflo - if help will consider long term treatment with generic tamsulosin vs finasteride

## 2012-01-02 ENCOUNTER — Telehealth: Payer: Self-pay | Admitting: Internal Medicine

## 2012-01-02 NOTE — Telephone Encounter (Signed)
Message copied by Newell Coral on Mon Jan 02, 2012 10:36 AM ------      Message from: Jacques Navy      Created: Sun Jan 01, 2012 11:48 AM       Needs follow up for BP check in 2-4 weeks. Thanks

## 2012-01-02 NOTE — Telephone Encounter (Signed)
Tried calling patient, no answer.  There was no answering machine to leave a message on.  Will try back if call is not returned.

## 2012-01-27 ENCOUNTER — Encounter: Payer: Self-pay | Admitting: Internal Medicine

## 2012-02-01 ENCOUNTER — Ambulatory Visit (INDEPENDENT_AMBULATORY_CARE_PROVIDER_SITE_OTHER): Payer: Medicare Other | Admitting: Internal Medicine

## 2012-02-01 ENCOUNTER — Encounter: Payer: Self-pay | Admitting: Internal Medicine

## 2012-02-01 VITALS — BP 134/78 | HR 61 | Temp 98.0°F | Resp 10 | Wt 235.0 lb

## 2012-02-01 DIAGNOSIS — H6123 Impacted cerumen, bilateral: Secondary | ICD-10-CM

## 2012-02-01 DIAGNOSIS — G4736 Sleep related hypoventilation in conditions classified elsewhere: Secondary | ICD-10-CM

## 2012-02-01 DIAGNOSIS — H6120 Impacted cerumen, unspecified ear: Secondary | ICD-10-CM

## 2012-02-01 DIAGNOSIS — E669 Obesity, unspecified: Secondary | ICD-10-CM

## 2012-02-01 NOTE — Progress Notes (Signed)
  Subjective:    Patient ID: Jeffrey Carter, male    DOB: 1940/09/01, 72 y.o.   MRN: [id number]  HPI To discuss overnight oximetry - for 45 minutes out of 9 hr 58 min he had oxygen level less than 89% PMH, FamHx and SocHx reviewed for any changes and relevance. Current Outpatient Prescriptions on File Prior to Visit  Medication Sig Dispense Refill  . aspirin 81 MG tablet Take 81 mg by mouth daily.        Marland Kitchen aspirin-sod bicarb-citric acid (ALKA-SELTZER) 325 MG TBEF Take 325 mg by mouth every 6 (six) hours as needed. For cold symptoms      . atenolol (TENORMIN) 50 MG tablet Take 1 tablet (50 mg total) by mouth daily.  30 tablet  3  . cetirizine (ZYRTEC) 10 MG tablet Take 10 mg by mouth daily as needed. For allergies      . dextromethorphan-guaiFENesin (ROBITUSSIN-DM) 10-100 MG/5ML liquid Take 5 mLs by mouth every 4 (four) hours as needed. For cold      . diltiazem (TAZTIA XT) 360 MG 24 hr capsule Take 1 capsule (360 mg total) by mouth daily.  30 capsule  11  . furosemide (LASIX) 40 MG tablet Take 1 tablet (40 mg total) by mouth daily.  30 tablet  11  . lovastatin (MEVACOR) 20 MG tablet Take 1 tablet (20 mg total) by mouth 2 (two) times daily at 10 AM and 5 PM.  60 tablet  8  . naproxen sodium (ANAPROX) 220 MG tablet Take 220 mg by mouth 2 (two) times daily as needed. For pain      . olmesartan (BENICAR) 40 MG tablet Take 1 tablet (40 mg total) by mouth daily.  30 tablet  11  . oxymetazoline (AFRIN NASAL SPRAY) 0.05 % nasal spray Place 2 sprays into the nose 2 (two) times daily.  30 mL  0  . traMADol (ULTRAM) 50 MG tablet Take 1 tablet (50 mg total) by mouth every 6 (six) hours as needed for pain.  15 tablet  0     Review of Systems System review is negative for any constitutional, cardiac, pulmonary, GI or neuro symptoms or complaints other than as described in the HPI.     Objective:   Physical Exam Filed Vitals:   02/01/12 1257  BP: 134/78  Pulse: 61  Temp: 98 F (36.7 C)  Resp: 10   HEENT - cerumen impaction bilaterally pulm - normal respirations Neuro - A&O x 3  Ear irrigation- using warm water and irrigation syringe both ears easily cleared. Normal TMs noted     Assessment & Plan:  Cerumen impaction - resolved with irrigation.

## 2012-02-01 NOTE — Patient Instructions (Addendum)
Overnight oximetry shows that for 45 minutes out of 9 hrs 58 minutes you had a low oxygen reading. The next step is to have a full sleep study at the overnight sleep lab. This will be scheduled for you and you will get a call about the date and location.

## 2012-02-09 ENCOUNTER — Telehealth: Payer: Self-pay | Admitting: *Deleted

## 2012-02-09 NOTE — Telephone Encounter (Signed)
Ephriam Knuckles, nurse with [facility] called to notify Dr Debby Bud of the pt's BP, 178/100. It is still uncontrolled. She wanted Dr Debby Bud to be aware of this.

## 2012-02-10 NOTE — Telephone Encounter (Signed)
Needs follow up ov next week.

## 2012-02-10 NOTE — Telephone Encounter (Signed)
Please call for a follow up ov per Dr Debby Bud.

## 2012-02-14 NOTE — Telephone Encounter (Signed)
Scheduled 02/16/12 @ 11:00

## 2012-02-16 ENCOUNTER — Encounter: Payer: Self-pay | Admitting: Internal Medicine

## 2012-02-16 ENCOUNTER — Ambulatory Visit (INDEPENDENT_AMBULATORY_CARE_PROVIDER_SITE_OTHER): Payer: Medicare Other | Admitting: Internal Medicine

## 2012-02-16 VITALS — BP 142/92 | HR 66 | Temp 97.4°F | Resp 10 | Wt 239.0 lb

## 2012-02-16 DIAGNOSIS — I10 Essential (primary) hypertension: Secondary | ICD-10-CM

## 2012-02-16 NOTE — Assessment & Plan Note (Signed)
BP today is borderline but acceptable based on new guidelines for a man his age.  Plan  monitor BP at home and report back.  Recommendations to follow.

## 2012-02-16 NOTE — Progress Notes (Signed)
  Subjective:    Patient ID: Jeffrey Carter, male    DOB: 12/11/1940, 72 y.o.   MRN: [id number]  HPI Mr. Holst had a [employer]-NP home assessment and presents for follow up. The NP recommended he have an LDL cholesterol check - this was done in Dec '13 - LDL 86. At the time of her visit his BP was elevated but he has been asymptomatic. He is on diuretic, CCB, ARB and BB. There were no other issues raised.  He is feeling well but a little sleepy  PMH, FamHx and SocHx reviewed for any changes and relevance. Current Outpatient Prescriptions on File Prior to Visit  Medication Sig Dispense Refill  . aspirin 81 MG tablet Take 81 mg by mouth daily.        Marland Kitchen atenolol (TENORMIN) 50 MG tablet Take 1 tablet (50 mg total) by mouth daily.  30 tablet  3  . diltiazem (TAZTIA XT) 360 MG 24 hr capsule Take 1 capsule (360 mg total) by mouth daily.  30 capsule  11  . furosemide (LASIX) 40 MG tablet Take 1 tablet (40 mg total) by mouth daily.  30 tablet  11  . lovastatin (MEVACOR) 20 MG tablet Take 1 tablet (20 mg total) by mouth 2 (two) times daily at 10 AM and 5 PM.  60 tablet  8  . naproxen sodium (ANAPROX) 220 MG tablet Take 220 mg by mouth 2 (two) times daily as needed. For pain      . olmesartan (BENICAR) 40 MG tablet Take 1 tablet (40 mg total) by mouth daily.  30 tablet  11  . oxymetazoline (AFRIN NASAL SPRAY) 0.05 % nasal spray Place 2 sprays into the nose 2 (two) times daily.  30 mL  0  . traMADol (ULTRAM) 50 MG tablet Take 1 tablet (50 mg total) by mouth every 6 (six) hours as needed for pain.  15 tablet  0  . aspirin-sod bicarb-citric acid (ALKA-SELTZER) 325 MG TBEF Take 325 mg by mouth every 6 (six) hours as needed. For cold symptoms      . cetirizine (ZYRTEC) 10 MG tablet Take 10 mg by mouth daily as needed. For allergies      . dextromethorphan-guaiFENesin (ROBITUSSIN-DM) 10-100 MG/5ML liquid Take 5 mLs by mouth every 4 (four) hours as needed. For cold          Review of Systems System review is  negative for any constitutional, cardiac, pulmonary, GI or neuro symptoms or complaints other than as described in the HPI.     Objective:   Physical Exam Filed Vitals:   02/16/12 1052  BP: 142/92  Pulse: 66  Temp: 97.4 F (36.3 C)  Resp: 10   BP Readings from Last 3 Encounters:  02/16/12 142/92  02/01/12 134/78  12/28/11 162/100   Cor - RRR Pulm - normal respirations Neuro - A&O x 3       Assessment & Plan:

## 2012-02-16 NOTE — Patient Instructions (Addendum)
[employer]-[facility] evaluation: you have had your cholesterol checked in December '13 and the LDL was 86 - excellent.  Blood pressure: 142/92 today on 4 drugs. This is an acceptable level. The readings the NP at home got were to high. Before changing or adding medication I need more information! Please check your blood pressure at home once or twice a day, at different times, for 5-7 days and bring me a record to review. We can make adjustments based on that information.

## 2012-02-28 ENCOUNTER — Ambulatory Visit (HOSPITAL_BASED_OUTPATIENT_CLINIC_OR_DEPARTMENT_OTHER): Payer: Medicare Other | Attending: Internal Medicine | Admitting: Radiology

## 2012-02-28 VITALS — Ht 69.0 in | Wt 235.0 lb

## 2012-02-28 DIAGNOSIS — G4733 Obstructive sleep apnea (adult) (pediatric): Secondary | ICD-10-CM | POA: Insufficient documentation

## 2012-02-28 DIAGNOSIS — G4736 Sleep related hypoventilation in conditions classified elsewhere: Secondary | ICD-10-CM

## 2012-03-08 DIAGNOSIS — G4710 Hypersomnia, unspecified: Secondary | ICD-10-CM

## 2012-03-08 DIAGNOSIS — G4730 Sleep apnea, unspecified: Secondary | ICD-10-CM

## 2012-03-08 NOTE — Procedures (Signed)
NAME:  Jeffrey Carter, Jeffrey Carter              ACCOUNT NO.:  [id number]  MEDICAL RECORD NO.:  [id number]          PATIENT TYPE:  OUT  LOCATION:  SLEEP CENTER                 FACILITY[facility]  [facility]  PHYSICIAN:  Barbaraann Share, MD,FCCPDATE OF BIRTH:  1940/09/16  DATE OF STUDY:  02/28/2012                           NOCTURNAL POLYSOMNOGRAM  REFERRING PHYSICIAN:  Rosalyn Gess. Norins, MD  INDICATION FOR STUDY:  Hypersomnia with sleep apnea.  EPWORTH SLEEPINESS SCORE:  4.  SLEEP ARCHITECTURE:  The patient had total sleep time of 97 minutes with no slow-wave sleep and only 7 minutes of REM.  Sleep onset latency was normal at 13 minutes and REM onset was normal at 50 minutes.  Sleep efficiency was very poor at 24%.  RESPIRATORY DATA:  The patient was found to have 17 obstructive and central apneas as well as 35 obstructive hypopneas, giving him an apnea- hypopnea index of 32 events per hour.  The events occurred in all body positions and there was very loud snoring noted throughout.  It should be noted the patient awakened just after midnight, and could not return to sleep.  OXYGEN DATA:  There was O2 desaturation as low as 79% with the patient's obstructive events.  CARDIAC DATA:  Occasional PAC and PVC noted, but no clinically significant arrhythmias were seen.  MOVEMENT/PARASOMNIA:  The patient had no significant leg jerks or other abnormalities noted.  IMPRESSION/RECOMMENDATION: 1. Moderate obstructive sleep apnea/hypopnea syndrome, with an AHI of     32 events per hour and oxygen desaturation as low as 79%.  It     should be noted the patient awakened just after midnight and could     not return for sleep.  Therefore, I suspect this study     underestimates his degree of sleep apnea. 2. Occasional PAC and PVC noted, but no clinically significant     arrhythmias were seen.     Barbaraann Share, MD,FCCP Diplomate, American Board of Sleep Medicine    [facility]  D:  03/08/2012 11:40:30  T:   03/08/2012 22:17:43  Job:  [id number]

## 2012-03-12 ENCOUNTER — Other Ambulatory Visit: Payer: Self-pay | Admitting: Internal Medicine

## 2012-03-12 DIAGNOSIS — G4733 Obstructive sleep apnea (adult) (pediatric): Secondary | ICD-10-CM

## 2012-03-19 ENCOUNTER — Other Ambulatory Visit: Payer: Self-pay | Admitting: Internal Medicine

## 2012-03-20 ENCOUNTER — Other Ambulatory Visit: Payer: Self-pay | Admitting: *Deleted

## 2012-03-20 MED ORDER — LOVASTATIN 20 MG PO TABS: 20.0000 mg | ORAL_TABLET | Freq: Two times a day (BID) | ORAL | Status: DC

## 2012-03-30 ENCOUNTER — Institutional Professional Consult (permissible substitution): Payer: Medicare Other | Admitting: Pulmonary Disease

## 2012-04-18 ENCOUNTER — Encounter: Payer: Self-pay | Admitting: Pulmonary Disease

## 2012-04-18 ENCOUNTER — Ambulatory Visit (INDEPENDENT_AMBULATORY_CARE_PROVIDER_SITE_OTHER): Payer: Medicare Other | Admitting: Pulmonary Disease

## 2012-04-18 VITALS — BP 160/108 | HR 53 | Temp 97.4°F | Ht 69.0 in | Wt 242.8 lb

## 2012-04-18 DIAGNOSIS — G4733 Obstructive sleep apnea (adult) (pediatric): Secondary | ICD-10-CM | POA: Insufficient documentation

## 2012-04-18 NOTE — Progress Notes (Signed)
Subjective:    Patient ID: Jeffrey Carter, male    DOB: July 19, 1940, 72 y.o.   MRN: [id number]  HPI The patient is a 72 year old male who I was asked to see for management of obstructive sleep apnea.  He has had a recent sleep study which shows moderate OSA, with an AHI of 32 events per hour and oxygen desaturation as low as 79%.  The patient has been noted to have loud snoring, as well as an abnormal breathing pattern during sleep by his wife.  He has frequent awakenings at night, although he feels that he is rested in the mornings upon arising.  The patient notes definite sleep pressure during the day with inactivity, and will often fall asleep while reading or watching television.  He also falls asleep in the evening watching TV.  He denies any issues with sleepiness while driving shorter distances, but does have issues with longer distances.  He states that his weight is up about 10 pounds over the last 2 years, and his Epworth score today is 4   Sleep Questionnaire What time do you typically go to bed?( Between what hours) 130a-2a 130a-2a at 1026 on 04/18/12 by Nita Sells, CMA How long does it take you to fall asleep? 5-10 mins 5-10 mins at 1026 on 04/18/12 by Marjo Bicker Mabe, CMA How many times during the night do you wake up? 3 3 at 1026 on 04/18/12 by Nita Sells, CMA What time do you get out of bed to start your day? 1100 1100 at 1026 on 04/18/12 by Nita Sells, CMA Do you drive or operate heavy machinery in your occupation? No No at 1026 on 04/18/12 by Nita Sells, CMA How much has your weight changed (up or down) over the past two years? (In pounds) No Value increased---pt could not give a firm answer at 1026 on 04/18/12 by Nita Sells, CMA Have you ever had a sleep study before? No No at 1026 on 04/18/12 by Marjo Bicker Mabe, CMA Do you currently use CPAP? No No at 1026 on 04/18/12 by Marjo Bicker Mabe, CMA Do you wear oxygen at any time? No No at 1026 on 04/18/12 by  Marjo Bicker Mabe, CMA   Review of Systems  Constitutional: Negative for fever and unexpected weight change.  HENT: Negative for ear pain, nosebleeds, congestion, sore throat, rhinorrhea, sneezing, trouble swallowing, dental problem, postnasal drip and sinus pressure.   Eyes: Negative for redness and itching.  Respiratory: Negative for cough, chest tightness, shortness of breath and wheezing.   Cardiovascular: Negative for palpitations and leg swelling.  Gastrointestinal: Negative for nausea and vomiting.  Genitourinary: Negative for dysuria.  Musculoskeletal: Negative for joint swelling.  Skin: Negative for rash.  Neurological: Negative for headaches.  Hematological: Does not bruise/bleed easily.  Psychiatric/Behavioral: Negative for dysphoric mood. The patient is not nervous/anxious.        Objective:   Physical Exam Constitutional:  Overweight male, no acute distress  HENT:  Nares with mucosal edema and swollen turbinates.  Oropharynx without exudate, palate and uvula are thick and elongated.   Eyes:  Perrla, eomi, no scleral icterus  Neck:  No JVD, no TMG  Cardiovascular:  Normal rate, regular rhythm, no rubs or gallops.  No murmurs        Intact distal pulses  Pulmonary :  Normal breath sounds, no stridor or respiratory distress   No rales, rhonchi, or wheezing  Abdominal:  Soft, nondistended, bowel sounds present.  No tenderness noted.   Musculoskeletal:  minimal lower extremity edema noted.  Lymph Nodes:  No cervical lymphadenopathy noted  Skin:  No cyanosis noted  Neurologic:  Appears sleepy, appropriate, moves all 4 extremities without obvious deficit.          Assessment & Plan:

## 2012-04-18 NOTE — Patient Instructions (Addendum)
Will start you on cpap.  Please call if you are having problems with tolerance, and we will work thru it with you. Work on weight loss followup with me in 6 weeks.

## 2012-04-18 NOTE — Assessment & Plan Note (Signed)
The patient has been found to have moderate obstructive sleep apnea by his recent sleep study, but he is clearly symptomatic and has difficult to control hypertension.  I've had a long discussion with him about sleep apnea, including its impact to his quality of life and cardiovascular health.  Given the severity of his sleep apnea, I think he would be best served by a trial of CPAP while working on weight loss.  The patient is agreeable to trying this. I will set the patient up on cpap at a moderate pressure level to allow for desensitization, and will troubleshoot the device over the next 4-6weeks if needed.  The pt is to call me if having issues with tolerance.  Will then optimize the pressure once patient is able to wear cpap on a consistent basis.

## 2012-04-19 ENCOUNTER — Other Ambulatory Visit: Payer: Self-pay | Admitting: Internal Medicine

## 2012-05-18 ENCOUNTER — Other Ambulatory Visit: Payer: Self-pay | Admitting: Internal Medicine

## 2012-05-30 ENCOUNTER — Ambulatory Visit (INDEPENDENT_AMBULATORY_CARE_PROVIDER_SITE_OTHER): Payer: Medicare Other | Admitting: Pulmonary Disease

## 2012-05-30 ENCOUNTER — Encounter: Payer: Self-pay | Admitting: Pulmonary Disease

## 2012-05-30 ENCOUNTER — Telehealth: Payer: Self-pay | Admitting: *Deleted

## 2012-05-30 VITALS — BP 172/120 | HR 63 | Temp 97.8°F | Ht 69.0 in | Wt 239.2 lb

## 2012-05-30 DIAGNOSIS — G4733 Obstructive sleep apnea (adult) (pediatric): Secondary | ICD-10-CM

## 2012-05-30 NOTE — Telephone Encounter (Signed)
Called pt, no answer/unable to leave message.  

## 2012-05-30 NOTE — Telephone Encounter (Signed)
Noted - no changes recommended at this time - thanks

## 2012-05-30 NOTE — Assessment & Plan Note (Signed)
The patient is wearing CPAP compliantly, and has seen some improvement in his symptoms.  He is having no issues with pressure, but we do need to work on mask fit.  I have told him that we need to optimize his pressure, and we'll do this on the automatic setting.  He is to work aggressively on weight loss.

## 2012-05-30 NOTE — Telephone Encounter (Signed)
Pt requesting callback about elevated BP at OV this AM. BP was 172/120. Pt calling to make MEN aware per last OV with him. Please advise.

## 2012-05-30 NOTE — Patient Instructions (Addendum)
Will optimize your pressure on the automatic setting for the next 2 weeks, and will call you with results once I receive the download. Will get your equipment company to work with you on your mask fit.  Work on weight loss followup with me in 6 mos if doing well, but call if having tolerance issues.

## 2012-05-30 NOTE — Progress Notes (Signed)
  Subjective:    Patient ID: Jeffrey Carter, male    DOB: 1940/06/23, 72 y.o.   MRN: [id number]  HPI The patient comes in today for followup of his obstructive sleep apnea.  He was started on CPAP at the last visit, and has been fairly compliant with the device since that time.  He is having no issues with the pressure, but is having some mask leaking.  His wife has commented that he no longer snores, and the patient feels that he is sleeping a little bit better.  He is still having some daytime sleepiness watching television, but I reminded him that we have yet to optimize his pressure.   Review of Systems  Constitutional: Negative for fever and unexpected weight change.  HENT: Negative for ear pain, nosebleeds, congestion, sore throat, rhinorrhea, sneezing, trouble swallowing, dental problem, postnasal drip and sinus pressure.   Eyes: Negative for redness and itching.  Respiratory: Negative for cough, chest tightness, shortness of breath and wheezing.   Cardiovascular: Negative for palpitations and leg swelling.  Gastrointestinal: Negative for nausea and vomiting.  Genitourinary: Negative for dysuria.  Musculoskeletal: Negative for joint swelling.  Skin: Negative for rash.  Neurological: Negative for headaches.  Hematological: Does not bruise/bleed easily.  Psychiatric/Behavioral: Negative for dysphoric mood. The patient is not nervous/anxious.        Objective:   Physical Exam Ow male in nad Nose without purulence or discharge noted No skin breakdown or pressure necrosis from cpap mask Neck without LN or TMG LE with minimal edema, no cyanosis Alert and oriented, does not appear overly sleepy, moves all 4.        Assessment & Plan:

## 2012-05-30 NOTE — Telephone Encounter (Signed)
Pt called back with recheck of BP at 3:45pm-BP 152/87.

## 2012-05-31 NOTE — Telephone Encounter (Signed)
Called pt, no answer/unable to leave message.  

## 2012-05-31 NOTE — Telephone Encounter (Signed)
Pt informed of MD's advisement. 

## 2012-06-19 ENCOUNTER — Other Ambulatory Visit: Payer: Self-pay | Admitting: Internal Medicine

## 2012-07-02 ENCOUNTER — Ambulatory Visit (INDEPENDENT_AMBULATORY_CARE_PROVIDER_SITE_OTHER): Payer: Medicare Other | Admitting: Internal Medicine

## 2012-07-02 ENCOUNTER — Encounter: Payer: Self-pay | Admitting: Internal Medicine

## 2012-07-02 VITALS — BP 170/104 | HR 55 | Temp 97.2°F | Resp 12 | Ht 69.0 in | Wt 236.8 lb

## 2012-07-02 DIAGNOSIS — H6123 Impacted cerumen, bilateral: Secondary | ICD-10-CM

## 2012-07-02 DIAGNOSIS — I10 Essential (primary) hypertension: Secondary | ICD-10-CM

## 2012-07-02 DIAGNOSIS — H6120 Impacted cerumen, unspecified ear: Secondary | ICD-10-CM

## 2012-07-02 MED ORDER — CLONIDINE HCL 0.1 MG PO TABS: 0.1000 mg | ORAL_TABLET | Freq: Two times a day (BID) | ORAL | Status: DC

## 2012-07-02 NOTE — Progress Notes (Signed)
Subjective:    Patient ID: Jeffrey Carter, male    DOB: Feb 04, 1940, 72 y.o.   MRN: [id number]  HPI Jeffrey Carter presents with hearing loss which he thinks is due to wax build up.  His BP is high today - 170/104. He has been taking his medications as prescribed: Benicar 40, CCB -diltiazem 360, atenolol 50 and furosemide 40 mg. He has not had headache, nosebleeds or double vision. He is feeling well.  Past Medical History  Diagnosis Date  . Hypertension   . Osteoarthritis   . Back pain   . Hyperlipidemia    Past Surgical History  Procedure Laterality Date  . Appendectomy    . Tonsillectomy    . Total knee arthroplasty      Right 2009   History reviewed. No pertinent family history. History   Social History  . Marital Status: Married    Spouse Name: N/A    Number of Children: N/A  . Years of Education: N/A   Occupational History  . retired    Social History Main Topics  . Smoking status: Former Smoker -- 0.25 packs/day for 8 years    Types: Cigarettes  . Smokeless tobacco: Never Used     Comment: quit at age 73  . Alcohol Use: No  . Drug Use: No  . Sexually Active: Yes -- Male partner(s)   Other Topics Concern  . 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 aspirin 81 MG tablet Take 81 mg by mouth daily.        Marland Kitchen aspirin-sod bicarb-citric acid (ALKA-SELTZER) 325 MG TBEF Take 325 mg by mouth every 6 (six) hours as needed. For cold symptoms      . atenolol (TENORMIN) 50 MG tablet TAKE ONE TABLET BY MOUTH ONCE DAILY  30 tablet  5  . BENICAR 40 MG tablet TAKE ONE TABLET BY MOUTH EVERY DAY  30 tablet  5  . dextromethorphan-guaiFENesin (ROBITUSSIN-DM) 10-100 MG/5ML liquid Take 5 mLs by mouth every 4 (four) hours as needed. For cold      .  furosemide (LASIX) 40 MG tablet TAKE ONE TABLET BY MOUTH EVERY DAY  30 tablet  5  . lovastatin (MEVACOR) 20 MG tablet Take 1 tablet (20 mg total) by mouth 2 (two) times daily at 10 AM and 5 PM.  60 tablet  8  . naproxen sodium (ANAPROX) 220 MG tablet Take 220 mg by mouth 2 (two) times daily as needed. For pain      . oxymetazoline (AFRIN NASAL SPRAY) 0.05 % nasal spray Place 2 sprays into the nose 2 (two) times daily.  30 mL  0  . TAZTIA XT 360 MG 24 hr capsule TAKE ONE CAPSULE BY MOUTH EVERY DAY  30 capsule  5   No current facility-administered medications on file prior to visit.       Review of Systems System review is negative for any constitutional, cardiac, pulmonary, GI or neuro symptoms or complaints other than as described in the HPI.     Objective:   Physical Exam Filed Vitals:   07/02/12 1405  BP: 170/104  Pulse: 55  Temp: 97.2 F (36.2 C)  Resp: 12   BP Readings from Last 3  Encounters:  07/02/12 170/104  05/30/12 172/120  04/18/12 160/108   Gen'l - overweight AA man in no distress HEENT_ EACs obstructed with cerumen bilaterally. Fundi - normal vessels, no hemorrhage. Cor- RRR Pulm - normal respirations Neuro - normal.        Assessment & Plan:  Cerumen impaction(s)  - some hearing loss  Plan Complete irrigation successfully - Ms. Caralee Ates, CMA

## 2012-07-02 NOTE — Patient Instructions (Addendum)
1. Ear wax blockage - irrigated without difficulty  2. Blood pressure -  BP Readings from Last 3 Encounters:  07/02/12 170/104  05/30/12 172/120  04/18/12 160/108   Blood pressure is poorly controlled.  Plan Stop Atenolol  Continue furosdemide, Taztia and benicar  Add clonidine 0.1 mg twice a day.   Keep a record of Blood pressure and let me know Monday how it is doing. If not controlled will increase the clonidine. Once we get control - go to patch  Be careful - clonidine can cause a rapid drop in blood pressure - you should be able to sit or lay down when you take it until we know you will tolerate it.

## 2012-07-02 NOTE — Assessment & Plan Note (Signed)
BP Readings from Last 3 Encounters:  07/02/12 170/104  05/30/12 172/120  04/18/12 160/108  Asymptomatic with normal exam.  Plan Modified medications: dropping BB and adding clonidine at low dose, 0.1 mg bid, with intention of titrating up before switching to patch.

## 2012-07-30 ENCOUNTER — Telehealth: Payer: Self-pay

## 2012-07-30 NOTE — Telephone Encounter (Signed)
Attempted to notify patient no answer/no voicemail  

## 2012-07-30 NOTE — Telephone Encounter (Signed)
Attempted to notify patient. No answer. No voicemail. Will try back later.

## 2012-07-30 NOTE — Telephone Encounter (Signed)
Message copied by Noreene Larsson on Mon Jul 30, 2012  8:38 AM ------      Message from: Jacques Navy      Created: Fri Jul 27, 2012 12:26 PM       Please call Mr. Heckmann: the blood pressure readings he dropped off look good. Continue present medications.            Thanks ------

## 2012-08-05 ENCOUNTER — Other Ambulatory Visit: Payer: Self-pay | Admitting: Pulmonary Disease

## 2012-08-05 DIAGNOSIS — G4733 Obstructive sleep apnea (adult) (pediatric): Secondary | ICD-10-CM

## 2012-08-06 NOTE — Telephone Encounter (Signed)
Left message with someone requesting patient call back

## 2012-08-07 ENCOUNTER — Telehealth: Payer: Self-pay | Admitting: *Deleted

## 2012-08-07 NOTE — Telephone Encounter (Signed)
Spoke with pt advised him of MDs note.

## 2012-08-31 ENCOUNTER — Telehealth: Payer: Self-pay | Admitting: Internal Medicine

## 2012-08-31 NOTE — Telephone Encounter (Signed)
rec'd records from [facility], forward 1 page to Dr.Norins

## 2012-10-17 ENCOUNTER — Ambulatory Visit (INDEPENDENT_AMBULATORY_CARE_PROVIDER_SITE_OTHER): Payer: Medicare Other

## 2012-10-17 DIAGNOSIS — Z23 Encounter for immunization: Secondary | ICD-10-CM

## 2012-10-21 ENCOUNTER — Other Ambulatory Visit: Payer: Self-pay | Admitting: Internal Medicine

## 2012-11-30 ENCOUNTER — Encounter: Payer: Self-pay | Admitting: Pulmonary Disease

## 2012-11-30 ENCOUNTER — Ambulatory Visit (INDEPENDENT_AMBULATORY_CARE_PROVIDER_SITE_OTHER): Payer: Medicare Other | Admitting: Pulmonary Disease

## 2012-11-30 VITALS — BP 158/98 | HR 72 | Temp 97.5°F | Ht 69.0 in | Wt 244.2 lb

## 2012-11-30 DIAGNOSIS — G4733 Obstructive sleep apnea (adult) (pediatric): Secondary | ICD-10-CM

## 2012-11-30 NOTE — Patient Instructions (Signed)
Stay on cpap, and keep up with mask cushion changes and supplies on a regular basis. Work on weight loss followup with me in one year if doing well.

## 2012-11-30 NOTE — Assessment & Plan Note (Signed)
The patient is doing very well on his current CPAP setup, and I have encouraged him to work aggressively on weight loss.  I've also asked him to keep up with his mask changes and supplies.  I will see him back in one year, but he is to call if he has tolerance issues.

## 2012-11-30 NOTE — Progress Notes (Signed)
  Subjective:    Patient ID: Jeffrey Carter, male    DOB: 03/27/1940, 72 y.o.   MRN: [id number]  HPI The patient comes in today for followup of his obstructive sleep apnea.  He is wearing CPAP compliantly, and is having no issues with his mask or pressure.  He feels that he sleeps well with the device, and his wife has not heard snoring.  He is satisfied with his daytime alertness.  Of note, his weight is up since last visit.   Review of Systems  Constitutional: Negative for fever and unexpected weight change.  HENT: Negative for congestion, dental problem, ear pain, nosebleeds, postnasal drip, rhinorrhea, sinus pressure, sneezing, sore throat and trouble swallowing.   Eyes: Negative for redness and itching.  Respiratory: Negative for cough, chest tightness, shortness of breath and wheezing.   Cardiovascular: Negative for palpitations and leg swelling.  Gastrointestinal: Negative for nausea and vomiting.  Genitourinary: Negative for dysuria.  Musculoskeletal: Negative for joint swelling.  Skin: Negative for rash.  Neurological: Negative for headaches.  Hematological: Does not bruise/bleed easily.  Psychiatric/Behavioral: Negative for dysphoric mood. The patient is not nervous/anxious.        Objective:   Physical Exam Overweight male in no acute distress Nose without purulence or discharge noted No skin breakdown or pressure necrosis from a CPAP mask Neck without lymphadenopathy or thyromegaly Lower extremities mild edema, no cyanosis Alert and oriented, does not appear to be sleepy, moves all 4 extremities.        Assessment & Plan:

## 2012-12-17 ENCOUNTER — Other Ambulatory Visit: Payer: Self-pay | Admitting: Internal Medicine

## 2012-12-21 ENCOUNTER — Other Ambulatory Visit: Payer: Self-pay | Admitting: Internal Medicine

## 2013-02-20 ENCOUNTER — Other Ambulatory Visit (INDEPENDENT_AMBULATORY_CARE_PROVIDER_SITE_OTHER): Payer: Medicare Other

## 2013-02-20 ENCOUNTER — Encounter: Payer: Self-pay | Admitting: Internal Medicine

## 2013-02-20 ENCOUNTER — Ambulatory Visit (INDEPENDENT_AMBULATORY_CARE_PROVIDER_SITE_OTHER): Payer: Medicare Other | Admitting: Internal Medicine

## 2013-02-20 VITALS — BP 154/90 | HR 74 | Temp 97.7°F | Ht 69.0 in | Wt 244.8 lb

## 2013-02-20 DIAGNOSIS — Z0000 Encounter for general adult medical examination without abnormal findings: Secondary | ICD-10-CM

## 2013-02-20 DIAGNOSIS — I10 Essential (primary) hypertension: Secondary | ICD-10-CM

## 2013-02-20 DIAGNOSIS — Z23 Encounter for immunization: Secondary | ICD-10-CM

## 2013-02-20 DIAGNOSIS — E785 Hyperlipidemia, unspecified: Secondary | ICD-10-CM

## 2013-02-20 DIAGNOSIS — G4733 Obstructive sleep apnea (adult) (pediatric): Secondary | ICD-10-CM

## 2013-02-20 DIAGNOSIS — Z1211 Encounter for screening for malignant neoplasm of colon: Secondary | ICD-10-CM

## 2013-02-20 DIAGNOSIS — N138 Other obstructive and reflux uropathy: Secondary | ICD-10-CM

## 2013-02-20 DIAGNOSIS — N401 Enlarged prostate with lower urinary tract symptoms: Secondary | ICD-10-CM

## 2013-02-20 LAB — COMPREHENSIVE METABOLIC PANEL
ALBUMIN: 4.2 g/dL (ref 3.5–5.2)
ALT: 30 U/L (ref 0–53)
AST: 23 U/L (ref 0–37)
Alkaline Phosphatase: 86 U/L (ref 39–117)
BUN: 11 mg/dL (ref 6–23)
CALCIUM: 9.2 mg/dL (ref 8.4–10.5)
CHLORIDE: 103 meq/L (ref 96–112)
CO2: 29 meq/L (ref 19–32)
Creatinine, Ser: 1.1 mg/dL (ref 0.4–1.5)
GFR: 86.74 mL/min (ref >60.00)
GLUCOSE: 95 mg/dL (ref 70–99)
POTASSIUM: 3.4 meq/L — AB (ref 3.5–5.1)
SODIUM: 138 meq/L (ref 135–145)
TOTAL PROTEIN: 7.2 g/dL (ref 6.0–8.3)
Total Bilirubin: 0.4 mg/dL (ref 0.3–1.2)

## 2013-02-20 LAB — LIPID PANEL
CHOLESTEROL: 154 mg/dL (ref 0–200)
HDL: 48.6 mg/dL (ref >39.00)
LDL Cholesterol: 81 mg/dL (ref 0–99)
TRIGLYCERIDES: 121 mg/dL (ref 0.0–149.0)
Total CHOL/HDL Ratio: 3
VLDL: 24.2 mg/dL (ref 0.0–40.0)

## 2013-02-20 MED ORDER — FINASTERIDE 5 MG PO TABS: 5.0000 mg | ORAL_TABLET | Freq: Every day | ORAL | Status: DC

## 2013-02-20 NOTE — Progress Notes (Signed)
Pre visit review using our clinic review tool, if applicable. No additional management support is needed unless otherwise documented below in the visit note. 

## 2013-02-20 NOTE — Progress Notes (Signed)
Subjective:    Patient ID: Jeffrey Carter, male    DOB: 20-May-1940, 73 y.o.   MRN: [id number]  HPI The patient is here for annual Medicare wellness examination and management of other chronic and acute problems.  Interval history - no major illness, no surgery, no injury. He is feeling well. He is using CPAP with good results. He just recently saw Dr. Gwenette Greet.    The risk factors are reflected in the social history.  The roster of all physicians providing medical care to patient - is listed in the Snapshot section of the chart.  Activities of daily living:  The patient is 100% inedpendent in all ADLs: dressing, toileting, feeding as well as independent mobility  Home safety : The patient has smoke detectors in the home. Falls - none. They wear seatbelts. No firearms at home.    There is no risks for hepatitis, STDs or HIV. There is no   history of blood transfusion. They have no travel history to infectious disease endemic areas of the world.  The patient has not seen their dentist in the last six month. He does think he may have 2-3 teeth that need to be extracted. They have seen their eye doctor in the last year. They admit to hearing difficulty and have not had audiologic testing in the last year.  They do not  have excessive sun exposure.   Discussed the need for sun protection: hats, long sleeves and use of sunscreen if there is significant sun exposure.   Diet: the importance of a healthy diet is discussed. They do have a healthy  diet.  The patient has a regular exercise program: calesthenics , 10 minutes duration, 5 per week.  The benefits of regular aerobic exercise were discussed.  Depression screen: there are no signs or vegative symptoms of depression- irritability, change in appetite, anhedonia, sadness/tearfullness.  Cognitive assessment: the patient manages all their financial and personal affairs and is actively engaged.   The following portions of the patient's  history were reviewed and updated as appropriate: allergies, current medications, past family history, past medical history,  past surgical history, past social history  and problem list.  Vision, hearing, body mass index were assessed and reviewed.   During the course of the visit the patient was educated and counseled about appropriate screening and preventive services including : fall prevention , diabetes screening, nutrition counseling, colorectal cancer screening, and recommended immunizations.  Past Medical History  Diagnosis Date  . Hypertension   . Osteoarthritis   . Back pain   . Hyperlipidemia    Past Surgical History  Procedure Laterality Date  . Appendectomy    . Tonsillectomy    . Total knee arthroplasty      Right 2009   History reviewed. No pertinent family history. History   Social History  . Marital Status: Married    Spouse Name: N/A    Number of Children: N/A  . Years of Education: N/A   Occupational History  . retired    Social History Main Topics  . Smoking status: Former Smoker -- 0.25 packs/day for 8 years    Types: Cigarettes    Quit date: 01/24/1962  . Smokeless tobacco: Never Used     Comment: quit at age 73  . Alcohol Use: No  . Drug Use: No  . Sexual Activity: Yes    Partners: Female   Other Topics Concern  . 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 aspirin 81 MG tablet Take 81 mg by mouth daily.        Marland Kitchen aspirin-sod bicarb-citric acid (ALKA-SELTZER) 325 MG TBEF Take 325 mg by mouth every 6 (six) hours as needed. For cold symptoms      . BENICAR 40 MG tablet TAKE ONE TABLET BY MOUTH ONCE DAILY  30 tablet  5  . cloNIDine (CATAPRES) 0.1 MG tablet TAKE ONE TABLET BY MOUTH TWICE DAILY  60 tablet  5  .  dextromethorphan-guaiFENesin (ROBITUSSIN-DM) 10-100 MG/5ML liquid Take 5 mLs by mouth every 4 (four) hours as needed. For cold      . furosemide (LASIX) 40 MG tablet TAKE ONE TABLET BY MOUTH ONCE DAILY  30 tablet  5  . lovastatin (MEVACOR) 20 MG tablet TAKE ONE TABLET BY MOUTH TWICE DAILY AT 10 AM AND 5 PM  60 tablet  5  . naproxen sodium (ANAPROX) 220 MG tablet Take 220 mg by mouth 2 (two) times daily as needed. For pain      . oxymetazoline (AFRIN NASAL SPRAY) 0.05 % nasal spray Place 2 sprays into the nose 2 (two) times daily.  30 mL  0  . TAZTIA XT 360 MG 24 hr capsule TAKE ONE CAPSULE BY MOUTH ONCE DAILY  30 capsule  5   No current facility-administered medications on file prior to visit.      Review of Systems Constitutional:  Negative for fever, chills, activity change and unexpected weight change.  HEENT:  Negative for hearing loss, ear pain, congestion, neck stiffness and postnasal drip. Negative for sore throat or swallowing problems. Negative for dental complaints.   Eyes: Negative for vision loss or change in visual acuity.  Respiratory: Negative for chest tightness and wheezing. Negative for DOE.   Cardiovascular: Negative for chest pain or palpitations. No decreased exercise tolerance Gastrointestinal: No change in bowel habit. No bloating or gas. No reflux or indigestion Genitourinary: Negative for urgency, frequency, flank pain.Positive for difficulty urinating: slow start, intermittency, nocturia.  Musculoskeletal: Negative for myalgias, back pain, arthralgias and gait problem.  Neurological: Negative for dizziness, tremors, weakness and headaches.  Hematological: Negative for adenopathy.  Psychiatric/Behavioral: Negative for behavioral problems and dysphoric mood.        Objective:   Physical Exam Filed Vitals:   02/20/13 1331  BP: 154/90  Pulse: 74  Temp: 97.7 F (36.5 C)   Wt Readings from Last 3 Encounters:  02/20/13 244 lb 12.8 oz (111.041 kg)  11/30/12 244  lb 3.2 oz (110.768 kg)  07/02/12 236 lb 12.8 oz (107.412 kg)   Gen'l: Well nourished well developed male in no acute distress  HEENT: Head: Normocephalic and atraumatic. Right Ear: External ear normal. EAC w/ cerumen impaction. Left Ear: External ear normal.  EAC w/ cerumen impaction. Nose: Nose normal. Mouth/Throat: Oropharynx is clear and moist. Dentition - missing many teeth. NO frank infections noted or gum disease. No buccal or palatal lesions. Posterior pharynx clear. Eyes: Conjunctivae and sclera clear. EOM intact. Pupils are equal, round, and reactive to light. Right eye exhibits no discharge. Left eye exhibits no discharge. Neck: Normal range of motion. Neck supple. No JVD present. No tracheal deviation present. No thyromegaly present.  Cardiovascular: Normal rate, regular rhythm, no gallop,  no friction rub, no murmur heard.      Quiet precordium. 2+ radial and DP pulses . No carotid bruits Pulmonary/Chest: Effort normal. No respiratory distress or increased WOB, no wheezes, no rales. No chest wall deformity or CVAT. Abdomen: Soft. Bowel sounds are normal in all quadrants. He exhibits no distension, no tenderness, no rebound or guarding, No heptosplenomegaly  Genitourinary:   Musculoskeletal: Normal range of motion. He exhibits no edema and no tenderness.       Small and large joints without redness, synovial thickening or deformity. Full range of motion preserved about all small, median and large joints.  Lymphadenopathy:    He has no cervical or supraclavicular adenopathy.  Neurological: He is alert and oriented to person, place, and time. CN II-XII intact. DTRs 2+ and symmetrical biceps, radial and patellar tendons. Cerebellar function normal with no tremor, rigidity, normal gait and station.  Skin: Skin is warm and dry. No rash noted. No erythema.  Psychiatric: He has a normal mood and affect. His behavior is normal. Thought content normal.   Recent Results (from the past 2160  hour(s))  COMPREHENSIVE METABOLIC PANEL     Status: Abnormal   Collection Time    02/20/13  2:47 PM      Result Value Range   Sodium 138  135 - 145 mEq/L   Potassium 3.4 (*) 3.5 - 5.1 mEq/L   Chloride 103  96 - 112 mEq/L   CO2 29  19 - 32 mEq/L   Glucose, Bld 95  70 - 99 mg/dL   BUN 11  6 - 23 mg/dL   Creatinine, Ser 1.1  0.4 - 1.5 mg/dL   Total Bilirubin 0.4  0.3 - 1.2 mg/dL   Alkaline Phosphatase 86  39 - 117 U/L   AST 23  0 - 37 U/L   ALT 30  0 - 53 U/L   Total Protein 7.2  6.0 - 8.3 g/dL   Albumin 4.2  3.5 - 5.2 g/dL   Calcium 9.2  8.4 - 10.5 mg/dL   GFR 86.74  >60.00 mL/min  LIPID PANEL     Status: None   Collection Time    02/20/13  2:47 PM      Result Value Range   Cholesterol 154  0 - 200 mg/dL   Comment: ATP III Classification       Desirable:  < 200 mg/dL               Borderline High:  200 - 239 mg/dL          High:  > = 240 mg/dL   Triglycerides 121.0  0.0 - 149.0 mg/dL   Comment: Normal:  <150 mg/dLBorderline High:  150 - 199 mg/dL   HDL 48.60  >39.00 mg/dL   VLDL 24.2  0.0 - 40.0 mg/dL   LDL Cholesterol 81  0 - 99 mg/dL   Total CHOL/HDL Ratio 3     Comment:                Men          Women1/2 Average Risk     3.4          3.3Average Risk          5.0          4.42X Average Risk          9.6          7.13X Average Risk  15.0          11.0                             Assessment & Plan:

## 2013-02-20 NOTE — Patient Instructions (Signed)
Good to see you.  1. BPH (Benign Prostatic Hypertrophy) enlargement of the prostate with problems with flow and emptying. Plan  Finasteride 5 mg once a day  Will take 8-12 weeks to see full benefit and improvement in flow.   2. Immunizations - will give Prevnar pneumonia vaccine today. Please come back in 4 weeks for shingles vaccine.  3. Colon cancer screening - you will be called with an appointment.  Thanks for coming to see me all these years. You will continue to get high quality care from my partners after I leave.

## 2013-02-20 NOTE — Assessment & Plan Note (Signed)
Progressive symptoms with slow start, intermittent stream, incomplete empyting and persistent nocturia.   Plan finasteride 5 mg once a day.

## 2013-02-23 NOTE — Assessment & Plan Note (Signed)
He does use and tolerate CPAP. He is current with Dr. Gwenette Greet.

## 2013-02-23 NOTE — Assessment & Plan Note (Signed)
Taking and tolerating "statin" therapy. For routine lab follow up.  Addendum - LDL better than goal of 100 or less. LFTs normal.

## 2013-02-23 NOTE — Assessment & Plan Note (Signed)
Jeffrey Carter is on a multiple drug regimen. He has been asymptomatic BP Readings from Last 3 Encounters:  02/20/13 154/90  11/30/12 158/98  07/02/12 170/104   Blood pressure remains borderline elevated.   Plan Continue present medications  Life-style management: low salt diet, weight loss.  (Body mass index is 36.13 kg/(m^2).)

## 2013-02-23 NOTE — Assessment & Plan Note (Signed)
Interval history is without major illness or surgery. Physical exam is normal except for his weight. Lab results reviewed - no specific abnormality. He is current with colorectal cancer screening and has by AUA recommendations aged out of prostate cancer screening. Immunizations are up to date.   In summary A nice man whose blood pressure is borderline out of control but he is otherwise doing well with good risk reduction treatment. He will continue his present regimen and return in 6 months for mid-year follow up.

## 2013-02-25 ENCOUNTER — Encounter: Payer: Self-pay | Admitting: Internal Medicine

## 2013-02-28 ENCOUNTER — Telehealth: Payer: Self-pay | Admitting: Internal Medicine

## 2013-02-28 NOTE — Telephone Encounter (Signed)
Abigail Butts is the home health nurse with [facility]  She is at his home.  His BP is 180/116 , then 190/120 and 180/110 with a larger cuff.  His labs that came in the mail showed low potassium.  He is on lasix.  He takes 40 mg once a day.

## 2013-02-28 NOTE — Telephone Encounter (Signed)
Pt has an appt. On Monday.  Jeffrey Carter spoke with him when he walked in to the office.

## 2013-02-28 NOTE — Telephone Encounter (Signed)
[facility] NP note reviewed -  Plan Continue present medications but increase Clonidine to three times a day.  Keep record of BP at home.  F/u office visit next week.

## 2013-03-04 ENCOUNTER — Ambulatory Visit (INDEPENDENT_AMBULATORY_CARE_PROVIDER_SITE_OTHER): Payer: Medicare Other | Admitting: Internal Medicine

## 2013-03-04 ENCOUNTER — Encounter: Payer: Self-pay | Admitting: Internal Medicine

## 2013-03-04 VITALS — BP 142/96 | HR 68 | Temp 98.0°F | Wt 239.8 lb

## 2013-03-04 DIAGNOSIS — I10 Essential (primary) hypertension: Secondary | ICD-10-CM

## 2013-03-04 MED ORDER — CLONIDINE HCL 0.1 MG PO TABS: 0.1000 mg | ORAL_TABLET | Freq: Three times a day (TID) | ORAL | Status: DC

## 2013-03-04 MED ORDER — LOVASTATIN 40 MG PO TABS: 40.0000 mg | ORAL_TABLET | Freq: Every day | ORAL | Status: DC

## 2013-03-04 MED ORDER — FUROSEMIDE 40 MG PO TABS: ORAL_TABLET | ORAL | Status: DC

## 2013-03-04 NOTE — Patient Instructions (Signed)
Blood pressure - better today at 142/96. It is very possible that the alka-seltzer Cold Plus, contains phenylephrine, may have driven up your blood pressure. As a result the clonidine was increased to 3 times a day.  Plan Take medications as listed below  Be careful with cold medications - no decongestants. If you are not sure ask the pharmacist about any cold medicine you may buy.  All Rx are up to date.

## 2013-03-04 NOTE — Progress Notes (Signed)
Pre visit review using our clinic review tool, if applicable. No additional management support is needed unless otherwise documented below in the visit note. 

## 2013-03-05 NOTE — Assessment & Plan Note (Signed)
BP Readings from Last 3 Encounters:  03/04/13 142/96  02/20/13 154/90  11/30/12 158/98   Elevated readings at home per [facility] - NP may have been influenced by use of otc decongestants. His BP today is better.  Plan Continue present regimen including increase in clonidine to TID.

## 2013-03-05 NOTE — Progress Notes (Signed)
   Subjective:    Patient ID: Jeffrey Carter, male    DOB: 1940/06/01, 73 y.o.   MRN: [id number]  HPI Jeffrey Carter presents today due to [facility]-NP report of very high BPs at the time of her home visit. - see phone note. It is noteworthy that the patient had been taking phenylephrine for a cold at that time! At the time of the phone message Jeffrey Carter was advised to increase Clonidine to tid dosing. He has been asymptomatic.  PMH, FamHx and SocHx reviewed for any changes and relevance.  Current Outpatient Prescriptions on File Prior to Visit  Medication Sig Dispense Refill  . aspirin 81 MG tablet Take 81 mg by mouth daily.        Marland Kitchen BENICAR 40 MG tablet TAKE ONE TABLET BY MOUTH ONCE DAILY  30 tablet  5  . finasteride (PROSCAR) 5 MG tablet Take 1 tablet (5 mg total) by mouth daily.  30 tablet  11  . naproxen sodium (ANAPROX) 220 MG tablet Take 220 mg by mouth 2 (two) times daily as needed. For pain      . oxymetazoline (AFRIN NASAL SPRAY) 0.05 % nasal spray Place 2 sprays into the nose 2 (two) times daily.  30 mL  0  . TAZTIA XT 360 MG 24 hr capsule TAKE ONE CAPSULE BY MOUTH ONCE DAILY  30 capsule  5   No current facility-administered medications on file prior to visit.      Review of Systems System review is negative for any constitutional, cardiac, pulmonary, GI or neuro symptoms or complaints other than as described in the HPI.     Objective:   Physical Exam Filed Vitals:   03/04/13 1513  BP: 142/96  Pulse: 68  Temp: 98 F (36.7 C)   BP Readings from Last 3 Encounters:  03/04/13 142/96  02/20/13 154/90  11/30/12 158/98   Gen'l- heavyset muscular man in no distress Cor - RRR Neuro - A&O x 3       Assessment & Plan:

## 2013-03-13 ENCOUNTER — Ambulatory Visit (INDEPENDENT_AMBULATORY_CARE_PROVIDER_SITE_OTHER): Payer: Medicare Other | Admitting: *Deleted

## 2013-03-13 DIAGNOSIS — Z23 Encounter for immunization: Secondary | ICD-10-CM

## 2013-03-13 DIAGNOSIS — Z2911 Encounter for prophylactic immunotherapy for respiratory syncytial virus (RSV): Secondary | ICD-10-CM

## 2013-03-23 ENCOUNTER — Ambulatory Visit (INDEPENDENT_AMBULATORY_CARE_PROVIDER_SITE_OTHER): Payer: Medicare Other | Admitting: Family Medicine

## 2013-03-23 ENCOUNTER — Encounter: Payer: Self-pay | Admitting: Family Medicine

## 2013-03-23 VITALS — BP 138/82 | HR 66 | Temp 97.1°F | Wt 247.0 lb

## 2013-03-23 DIAGNOSIS — M109 Gout, unspecified: Secondary | ICD-10-CM

## 2013-03-23 MED ORDER — INDOMETHACIN 50 MG PO CAPS: 50.0000 mg | ORAL_CAPSULE | Freq: Two times a day (BID) | ORAL | Status: DC

## 2013-03-23 NOTE — Progress Notes (Signed)
Subjective:    Patient ID: Jeffrey Carter, male    DOB: [birth year], 73 y.o.   MRN: [id number]  HPI Here with a swollen foot since Wednesday- is tight and uncomfortable  Mostly around the big toe joint  Not extremely painful but it hurts  Has never happened before Can bear wt on it  Improves a bit after elevation / in the am   No leg or knee pain  No trauma to there extremity  Does eat a lot of steak and chicken  No change in diet  Patient Active Problem List   Diagnosis Date Noted  . OSA (obstructive sleep apnea) 04/18/2012  . Routine health maintenance 06/02/2011  . BENIGN PROSTATIC HYPERTROPHY, WITH URINARY OBSTRUCTION 08/06/2009  . KNEE PAIN 03/27/2009  . LEG PAIN, LEFT 03/27/2009  . 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 ABDOMINAL PAIN 03/15/2007  . HYPERLIPIDEMIA 10/27/2006  . HYPERTENSION 09/09/2006   Past Medical History  Diagnosis Date  . Hypertension   . Osteoarthritis   . Back pain   . Hyperlipidemia    Past Surgical History  Procedure Laterality Date  . Appendectomy    . Tonsillectomy    . Total knee arthroplasty      Right 2009   History  Substance Use Topics  . Smoking status: Former Smoker -- 0.25 packs/day for 8 years    Types: Cigarettes    Quit date: 01/24/1962  . Smokeless tobacco: Never Used     Comment: quit at age 73  . Alcohol Use: No   No family history on file. No Known Allergies Current Outpatient Prescriptions on File Prior to Visit  Medication Sig Dispense Refill  . aspirin 81 MG tablet Take 81 mg by mouth daily.        Marland Kitchen BENICAR 40 MG tablet TAKE ONE TABLET BY MOUTH ONCE DAILY  30 tablet  5  . cloNIDine (CATAPRES) 0.1 MG tablet Take 1 tablet (0.1 mg total) by mouth 3 (three) times daily.  90 tablet  11  . finasteride (PROSCAR) 5 MG tablet Take 1 tablet (5 mg total) by mouth daily.  30 tablet  11  . furosemide (LASIX) 40 MG tablet TAKE ONE TABLET BY MOUTH ONCE DAILY   30 tablet  11  . lovastatin (MEVACOR) 40 MG tablet Take 1 tablet (40 mg total) by mouth daily at 6 PM.  30 tablet  11  . naproxen sodium (ANAPROX) 220 MG tablet Take 220 mg by mouth 2 (two) times daily as needed. For pain      . oxymetazoline (AFRIN NASAL SPRAY) 0.05 % nasal spray Place 2 sprays into the nose 2 (two) times daily.  30 mL  0  . TAZTIA XT 360 MG 24 hr capsule TAKE ONE CAPSULE BY MOUTH ONCE DAILY  30 capsule  5   No current facility-administered medications on file prior to visit.    Review of Systems Review of Systems  Constitutional: Negative for fever, appetite change, fatigue and unexpected weight change.  Eyes: Negative for pain and visual disturbance.  Respiratory: Negative for cough and shortness of breath.   Cardiovascular: Negative for cp or palpitations    Gastrointestinal: Negative for nausea, diarrhea and constipation.  Genitourinary: Negative for urgency and frequency.  Skin: Negative for pallor or rash   MSK pos for swelling of L foot/toe joint and pain  Neurological: Negative for weakness, light-headedness, numbness and headaches.  Hematological: Negative for adenopathy. Does  not bruise/bleed easily.  Psychiatric/Behavioral: Negative for dysphoric mood. The patient is not nervous/anxious.         Objective:   Physical Exam  Constitutional: He appears well-developed and well-nourished. No distress.  obese and well appearing   HENT:  Head: Normocephalic and atraumatic.  Eyes: Conjunctivae and EOM are normal. Pupils are equal, round, and reactive to light. No scleral icterus.  Neck: Normal range of motion. Neck supple.  Cardiovascular: Normal rate and regular rhythm.   Musculoskeletal: He exhibits edema and tenderness.  Swelling / warmth / tenderness over L great toe  Nl rom - uncomfortable to flex and ext toes however  No skin breakdown   Lymphadenopathy:    He has no cervical adenopathy.  Neurological: He is alert.  Skin: Skin is warm and dry. No  rash noted. There is erythema.  Mild erythema/ hyperpigmentation over L great toe joint   Psychiatric: He has a normal mood and affect.          Assessment & Plan:

## 2013-03-23 NOTE — Assessment & Plan Note (Signed)
Suspect early in course with mild pain and swelling/ warmth Since colchicine does not mix well with current meds-px given for indocin 50 mg to take prn with food  inst to elevate foot Given low purine diet to look at and enc to keep up a good fluid intake as well  If persistent or  Recurrent- would benefit from films and uric acid level with PCP Update if not starting to improve in a week or if worsening

## 2013-03-23 NOTE — Patient Instructions (Signed)
I think you have some gout in your toe  Elevate your foot as often as possible and use either a warm or cool compress  Take the indocin up to twice daily as needed with food - if it bothers your stomach stop it  Make sure to drink plenty of water   Purine Restricted Diet A low-purine diet consists of foods that reduce uric acid made in your body. INDICATIONS FOR USE  Your caregiver may ask you to follow a low-purine diet to reduce gout flairs.  GUIDELINES  Avoid high-purine foods, including all alcohol, yeast extracts taken as supplements, and sauces made from meats (like gravy). Do not eat high-purine meats, including anchovies, sardines, herring, mussels, tuna, codfish, scallops, trout, haddock, bacon, organ meats, tripe, goose, wild game, and sweetbreads.  Grains  Allowed/Recommended: All, except those listed to consume in moderation.  Consume in Moderation: Oatmeal ( cup uncooked daily), wheat bran or germ ( cup daily), and whole grains. Vegetables  Allowed/Recommended: All, except those listed to consume in moderation.  Consume in Moderation: Asparagus, cauliflower, spinach, mushrooms, and green peas ( cup daily). Fruit  Allowed/Recommended: All.  Consume in Moderation: None. Meat and Meat Substitutes  Allowed/Recommended: Eggs, nuts, and peanut butter.  Consume in Moderation: Limit to 4 to 6 oz daily. Avoid high-purine meats. Lentils, peas, and dried beans (1 cup daily). Milk  Allowed/Recommended: All. Choose low-fat or skim when possible.  Consume in Moderation: None. Fats and Oils  Allowed/Recommended: All.  Consume in Moderation: None. Beverages  Allowed/Recommended: All, except those listed to avoid.  Avoid: All alcohol. Condiments/Miscellaneous  Allowed/Recommended: All, except those listed to consume in moderation.  Consume in Moderation: Bouillon and meat-based broths and soups. Document Released: 05/07/2010 Document Revised: 04/04/2011 Document  Reviewed: 05/07/2010 [facility] Patient Information 2014 [place].

## 2013-04-03 ENCOUNTER — Encounter: Payer: Self-pay | Admitting: Internal Medicine

## 2013-04-03 ENCOUNTER — Ambulatory Visit (INDEPENDENT_AMBULATORY_CARE_PROVIDER_SITE_OTHER): Payer: Medicare Other | Admitting: Internal Medicine

## 2013-04-03 ENCOUNTER — Other Ambulatory Visit (INDEPENDENT_AMBULATORY_CARE_PROVIDER_SITE_OTHER): Payer: Medicare Other

## 2013-04-03 VITALS — BP 138/90 | HR 68 | Temp 97.3°F | Wt 243.0 lb

## 2013-04-03 DIAGNOSIS — R972 Elevated prostate specific antigen [PSA]: Secondary | ICD-10-CM

## 2013-04-03 DIAGNOSIS — N320 Bladder-neck obstruction: Secondary | ICD-10-CM

## 2013-04-03 DIAGNOSIS — M109 Gout, unspecified: Secondary | ICD-10-CM

## 2013-04-03 DIAGNOSIS — G4733 Obstructive sleep apnea (adult) (pediatric): Secondary | ICD-10-CM

## 2013-04-03 DIAGNOSIS — I10 Essential (primary) hypertension: Secondary | ICD-10-CM

## 2013-04-03 LAB — HEPATIC FUNCTION PANEL
ALBUMIN: 4 g/dL (ref 3.5–5.2)
ALT: 30 U/L (ref 0–53)
AST: 20 U/L (ref 0–37)
Alkaline Phosphatase: 80 U/L (ref 39–117)
BILIRUBIN TOTAL: 0.6 mg/dL (ref 0.3–1.2)
Bilirubin, Direct: 0.1 mg/dL (ref 0.0–0.3)
Total Protein: 6.5 g/dL (ref 6.0–8.3)

## 2013-04-03 LAB — URINALYSIS
BILIRUBIN URINE: NEGATIVE
Hgb urine dipstick: NEGATIVE
KETONES UR: NEGATIVE
Leukocytes, UA: NEGATIVE
NITRITE: NEGATIVE
PH: 6.5 (ref 5.0–8.0)
SPECIFIC GRAVITY, URINE: 1.015 (ref 1.000–1.030)
TOTAL PROTEIN, URINE-UPE24: NEGATIVE
Urine Glucose: NEGATIVE
Urobilinogen, UA: 0.2 (ref 0.0–1.0)

## 2013-04-03 LAB — BASIC METABOLIC PANEL
BUN: 13 mg/dL (ref 6–23)
CHLORIDE: 104 meq/L (ref 96–112)
CO2: 31 mEq/L (ref 19–32)
Calcium: 9 mg/dL (ref 8.4–10.5)
Creatinine, Ser: 1.1 mg/dL (ref 0.4–1.5)
GFR: 87.64 mL/min (ref >60.00)
Glucose, Bld: 103 mg/dL — ABNORMAL HIGH (ref 70–99)
POTASSIUM: 4 meq/L (ref 3.5–5.1)
SODIUM: 139 meq/L (ref 135–145)

## 2013-04-03 LAB — CBC WITH DIFFERENTIAL/PLATELET
BASOS PCT: 0.7 % (ref 0.0–3.0)
Basophils Absolute: 0 10*3/uL (ref 0.0–0.1)
Eosinophils Absolute: 0.2 10*3/uL (ref 0.0–0.7)
Eosinophils Relative: 4.3 % (ref 0.0–5.0)
HEMATOCRIT: 39.8 % (ref 39.0–52.0)
HEMOGLOBIN: 13.4 g/dL (ref 13.0–17.0)
LYMPHS ABS: 2.6 10*3/uL (ref 0.7–4.0)
Lymphocytes Relative: 50.5 % — ABNORMAL HIGH (ref 12.0–46.0)
MCHC: 33.6 g/dL (ref 30.0–36.0)
MCV: 82.4 fl (ref 78.0–100.0)
MONOS PCT: 11.6 % (ref 3.0–12.0)
Monocytes Absolute: 0.6 10*3/uL (ref 0.1–1.0)
NEUTROS ABS: 1.7 10*3/uL (ref 1.4–7.7)
Neutrophils Relative %: 32.9 % — ABNORMAL LOW (ref 43.0–77.0)
Platelets: 186 10*3/uL (ref 150.0–400.0)
RBC: 4.83 Mil/uL (ref 4.22–5.81)
RDW: 13.8 % (ref 11.5–14.6)
WBC: 5.2 10*3/uL (ref 4.5–10.5)

## 2013-04-03 LAB — PSA: PSA: 7.33 ng/mL — ABNORMAL HIGH (ref 0.10–4.00)

## 2013-04-03 LAB — URIC ACID: URIC ACID, SERUM: 7.9 mg/dL — AB (ref 4.0–7.8)

## 2013-04-03 LAB — TSH: TSH: 0.93 u[IU]/mL (ref 0.35–5.50)

## 2013-04-03 MED ORDER — INDOMETHACIN 50 MG PO CAPS: 50.0000 mg | ORAL_CAPSULE | Freq: Three times a day (TID) | ORAL | Status: DC | PRN

## 2013-04-03 MED ORDER — VITAMIN D 1000 UNITS PO TABS: 1000.0000 [IU] | ORAL_TABLET | Freq: Every day | ORAL | Status: AC

## 2013-04-03 NOTE — Assessment & Plan Note (Addendum)
3/15 L 1st [facility]

## 2013-04-03 NOTE — Progress Notes (Signed)
Pre visit review using our clinic review tool, if applicable. No additional management support is needed unless otherwise documented below in the visit note. 

## 2013-04-03 NOTE — Assessment & Plan Note (Signed)
Continue with current prescription therapy as reflected on the Med list.  

## 2013-04-03 NOTE — Assessment & Plan Note (Signed)
On CPAP. ?

## 2013-04-03 NOTE — Progress Notes (Signed)
Subjective:     HPI  The patient presents for a follow-up of  chronic hypertension, chronic dyslipidemia, gout controlled with medicines  BP Readings from Last 3 Encounters:  04/03/13 138/90  03/23/13 138/82  03/04/13 142/96   Wt Readings from Last 3 Encounters:  04/03/13 243 lb (110.224 kg)  03/23/13 247 lb (112.038 kg)  03/04/13 239 lb 12.8 oz (108.773 kg)     Review of Systems  Constitutional: Negative for appetite change, fatigue and unexpected weight change.  HENT: Negative for congestion, nosebleeds, sneezing, sore throat and trouble swallowing.   Eyes: Negative for itching and visual disturbance.  Respiratory: Negative for cough.   Cardiovascular: Negative for chest pain, palpitations and leg swelling.  Gastrointestinal: Negative for nausea, diarrhea, blood in stool and abdominal distention.  Genitourinary: Negative for frequency and hematuria.  Musculoskeletal: Positive for arthralgias. Negative for back pain, gait problem, joint swelling and neck pain.  Skin: Negative for rash.  Neurological: Negative for dizziness, tremors, speech difficulty and weakness.  Psychiatric/Behavioral: Negative for sleep disturbance, dysphoric mood and agitation. The patient is not nervous/anxious.        Objective:   Physical Exam  Constitutional: He is oriented to person, place, and time. He appears well-developed. No distress.  NAD  HENT:  Mouth/Throat: Oropharynx is clear and moist.  Eyes: Conjunctivae are normal. Pupils are equal, round, and reactive to light.  Neck: Normal range of motion. No JVD present. No thyromegaly present.  Cardiovascular: Normal rate, regular rhythm, normal heart sounds and intact distal pulses.  Exam reveals no gallop and no friction rub.   No murmur heard. Pulmonary/Chest: Effort normal and breath sounds normal. No respiratory distress. He has no wheezes. He has no rales. He exhibits no tenderness.  Abdominal: Soft. Bowel sounds are normal. He exhibits  no distension and no mass. There is no tenderness. There is no rebound and no guarding.  Musculoskeletal: Normal range of motion. He exhibits tenderness. He exhibits no edema.  L 1st MTP swollen, tender  Lymphadenopathy:    He has no cervical adenopathy.  Neurological: He is alert and oriented to person, place, and time. He has normal reflexes. No cranial nerve deficit. He exhibits normal muscle tone. He displays a negative Romberg sign. Coordination and gait normal.  No meningeal signs  Skin: Skin is warm and dry. No rash noted. No erythema.  Psychiatric: He has a normal mood and affect. His behavior is normal. Judgment and thought content normal.       Assessment:         Plan:

## 2013-04-04 DIAGNOSIS — R972 Elevated prostate specific antigen [PSA]: Secondary | ICD-10-CM | POA: Insufficient documentation

## 2013-04-04 NOTE — Assessment & Plan Note (Signed)
RTC for a re-check

## 2013-04-08 ENCOUNTER — Other Ambulatory Visit: Payer: Self-pay | Admitting: *Deleted

## 2013-04-08 DIAGNOSIS — R972 Elevated prostate specific antigen [PSA]: Secondary | ICD-10-CM

## 2013-07-05 ENCOUNTER — Encounter: Payer: Self-pay | Admitting: Internal Medicine

## 2013-07-05 ENCOUNTER — Ambulatory Visit (INDEPENDENT_AMBULATORY_CARE_PROVIDER_SITE_OTHER): Payer: Medicare Other | Admitting: Internal Medicine

## 2013-07-05 VITALS — BP 120/80 | HR 84 | Temp 98.4°F | Resp 16 | Wt 248.0 lb

## 2013-07-05 DIAGNOSIS — H6120 Impacted cerumen, unspecified ear: Secondary | ICD-10-CM

## 2013-07-05 DIAGNOSIS — N529 Male erectile dysfunction, unspecified: Secondary | ICD-10-CM

## 2013-07-05 DIAGNOSIS — I10 Essential (primary) hypertension: Secondary | ICD-10-CM

## 2013-07-05 DIAGNOSIS — R609 Edema, unspecified: Secondary | ICD-10-CM

## 2013-07-05 DIAGNOSIS — M109 Gout, unspecified: Secondary | ICD-10-CM

## 2013-07-05 MED ORDER — DILTIAZEM HCL ER BEADS 240 MG PO CP24: 240.0000 mg | ORAL_CAPSULE | Freq: Every day | ORAL | Status: DC

## 2013-07-05 MED ORDER — SILDENAFIL CITRATE 100 MG PO TABS: 100.0000 mg | ORAL_TABLET | ORAL | Status: DC | PRN

## 2013-07-05 NOTE — Assessment & Plan Note (Signed)
viagra prn 

## 2013-07-05 NOTE — Progress Notes (Signed)
Subjective:     HPI  The patient presents for a follow-up of  chronic hypertension, chronic dyslipidemia, gout controlled with medicines  BP Readings from Last 3 Encounters:  07/05/13 120/80  04/03/13 138/90  03/23/13 138/82   Wt Readings from Last 3 Encounters:  07/05/13 248 lb (112.492 kg)  04/03/13 243 lb (110.224 kg)  03/23/13 247 lb (112.038 kg)     Review of Systems  Constitutional: Negative for appetite change, fatigue and unexpected weight change.  HENT: Negative for congestion, nosebleeds, sneezing, sore throat and trouble swallowing.   Eyes: Negative for itching and visual disturbance.  Respiratory: Negative for cough.   Cardiovascular: Negative for chest pain, palpitations and leg swelling.  Gastrointestinal: Negative for nausea, diarrhea, blood in stool and abdominal distention.  Genitourinary: Negative for frequency and hematuria.  Musculoskeletal: Positive for arthralgias. Negative for back pain, gait problem, joint swelling and neck pain.  Skin: Negative for rash.  Neurological: Negative for dizziness, tremors, speech difficulty and weakness.  Psychiatric/Behavioral: Negative for sleep disturbance, dysphoric mood and agitation. The patient is not nervous/anxious.        Objective:   Physical Exam  Constitutional: He is oriented to person, place, and time. He appears well-developed. No distress.  NAD  HENT:  Mouth/Throat: Oropharynx is clear and moist.  Eyes: Conjunctivae are normal. Pupils are equal, round, and reactive to light.  Neck: Normal range of motion. No JVD present. No thyromegaly present.  Cardiovascular: Normal rate, regular rhythm, normal heart sounds and intact distal pulses.  Exam reveals no gallop and no friction rub.   No murmur heard. Pulmonary/Chest: Effort normal and breath sounds normal. No respiratory distress. He has no wheezes. He has no rales. He exhibits no tenderness.  Abdominal: Soft. Bowel sounds are normal. He exhibits no  distension and no mass. There is no tenderness. There is no rebound and no guarding.  Musculoskeletal: Normal range of motion. He exhibits no edema and no tenderness.  L 1st MTP not tender  Lymphadenopathy:    He has no cervical adenopathy.  Neurological: He is alert and oriented to person, place, and time. He has normal reflexes. No cranial nerve deficit. He exhibits normal muscle tone. He displays a negative Romberg sign. Coordination and gait normal.  No meningeal signs  Skin: Skin is warm and dry. No rash noted. No erythema.  Psychiatric: He has a normal mood and affect. His behavior is normal. Judgment and thought content normal.  L ankle edema trace to 1+, R ankle - trace Wax B L>R  Lab Results  Component Value Date   WBC 5.2 04/03/2013   HGB 13.4 04/03/2013   HCT 39.8 04/03/2013   PLT 186.0 04/03/2013   GLUCOSE 103* 04/03/2013   CHOL 154 02/20/2013   TRIG 121.0 02/20/2013   HDL 48.60 02/20/2013   LDLCALC 81 02/20/2013   ALT 30 04/03/2013   AST 20 04/03/2013   NA 139 04/03/2013   K 4.0 04/03/2013   CL 104 04/03/2013   CREATININE 1.1 04/03/2013   BUN 13 04/03/2013   CO2 31 04/03/2013   TSH 0.93 04/03/2013   PSA 7.33* 04/03/2013   INR 0.9 06/15/2007    Procedure Note :     Procedure :  Ear irrigation   Indication:  Cerumen impaction B   Risks, including pain, dizziness, eardrum perforation, bleeding, infection and others as well as benefits were explained to the patient in detail. Verbal consent was obtained and the patient agreed to proceed.  We used "The Elephant Ear Irrigation Device" filled with lukewarm water for irrigation. A large amount wax was recovered. Procedure has also required manual wax removal with an ear loop.   Tolerated well. Complications: None.   Postprocedure instructions :  Call if problems.      Assessment:         Plan:

## 2013-07-05 NOTE — Assessment & Plan Note (Signed)
Continue with current prescription therapy as reflected on the Med list.  

## 2013-07-05 NOTE — Assessment & Plan Note (Signed)
See procedure 

## 2013-07-05 NOTE — Assessment & Plan Note (Signed)
Will decrease Cardizem dose

## 2013-07-05 NOTE — Progress Notes (Signed)
Pre visit review using our clinic review tool, if applicable. No additional management support is needed unless otherwise documented below in the visit note. 

## 2013-07-05 NOTE — Assessment & Plan Note (Signed)
Resolved

## 2013-07-06 ENCOUNTER — Telehealth: Payer: Self-pay | Admitting: Internal Medicine

## 2013-07-06 NOTE — Telephone Encounter (Signed)
Relevant patient education mailed to patient.  

## 2013-07-08 ENCOUNTER — Ambulatory Visit: Payer: Medicare Other | Admitting: Internal Medicine

## 2013-07-09 ENCOUNTER — Encounter: Payer: Self-pay | Admitting: Internal Medicine

## 2013-07-25 ENCOUNTER — Other Ambulatory Visit: Payer: Self-pay | Admitting: *Deleted

## 2013-07-25 MED ORDER — OLMESARTAN MEDOXOMIL 40 MG PO TABS: ORAL_TABLET | ORAL | Status: DC

## 2013-07-25 NOTE — Telephone Encounter (Signed)
Left msg on triage pharmacy has being trying to get refill on his benicar. Tried calling pt back can't leave msg due to vm being full. Will send to [facility]...Jeffrey Carter

## 2013-08-02 ENCOUNTER — Telehealth: Payer: Self-pay | Admitting: Internal Medicine

## 2013-08-02 NOTE — Telephone Encounter (Signed)
Pt came in to request alternative medication for Rx VIAGRA. Pt states the prescription he received is too expensive so he is requesting something more affordable. Please contact pt at [phone] when this request is reviewed.

## 2013-08-05 MED ORDER — SILDENAFIL CITRATE 100 MG PO TABS: 100.0000 mg | ORAL_TABLET | ORAL | Status: DC | PRN

## 2013-08-05 NOTE — Telephone Encounter (Signed)
All ED meds cost about the same per pill. Try [facility] or [facility]

## 2013-08-05 NOTE — Telephone Encounter (Signed)
Pt notified, pt would like to try [place], rx sent there

## 2013-08-05 NOTE — Telephone Encounter (Signed)
lmtcb

## 2013-08-07 ENCOUNTER — Ambulatory Visit (INDEPENDENT_AMBULATORY_CARE_PROVIDER_SITE_OTHER): Payer: Medicare Other | Admitting: Internal Medicine

## 2013-08-07 ENCOUNTER — Encounter: Payer: Self-pay | Admitting: Internal Medicine

## 2013-08-07 VITALS — BP 150/90 | HR 76 | Temp 98.4°F | Resp 16 | Wt 247.0 lb

## 2013-08-07 DIAGNOSIS — R609 Edema, unspecified: Secondary | ICD-10-CM

## 2013-08-07 DIAGNOSIS — I10 Essential (primary) hypertension: Secondary | ICD-10-CM

## 2013-08-07 MED ORDER — SILDENAFIL CITRATE 20 MG PO TABS: 20.0000 mg | ORAL_TABLET | Freq: Every day | ORAL | Status: DC | PRN

## 2013-08-07 MED ORDER — CLONIDINE HCL 0.2 MG PO TABS: 0.2000 mg | ORAL_TABLET | Freq: Two times a day (BID) | ORAL | Status: DC

## 2013-08-07 MED ORDER — SILDENAFIL CITRATE 20 MG PO TABS: 20.0000 mg | ORAL_TABLET | Freq: Three times a day (TID) | ORAL | Status: DC

## 2013-08-07 NOTE — Assessment & Plan Note (Signed)
Viagra is too $$$ Will try 20-40 mg Sildenafil prn

## 2013-08-07 NOTE — Progress Notes (Signed)
Pre visit review using our clinic review tool, if applicable. No additional management support is needed unless otherwise documented below in the visit note. 

## 2013-08-07 NOTE — Assessment & Plan Note (Signed)
Resolved on lower CCB dose

## 2013-08-07 NOTE — Assessment & Plan Note (Signed)
Worse Will increase Clonidine to 0.2 mg bid

## 2013-08-07 NOTE — Progress Notes (Signed)
   Subjective:     HPI  The patient presents for a follow-up of  chronic hypertension, chronic dyslipidemia, gout controlled with medicines  BP Readings from Last 3 Encounters:  08/07/13 150/90  07/05/13 120/80  04/03/13 138/90   Wt Readings from Last 3 Encounters:  08/07/13 247 lb (112.038 kg)  07/05/13 248 lb (112.492 kg)  04/03/13 243 lb (110.224 kg)     Review of Systems  Constitutional: Negative for appetite change, fatigue and unexpected weight change.  HENT: Negative for congestion, nosebleeds, sneezing, sore throat and trouble swallowing.   Eyes: Negative for itching and visual disturbance.  Respiratory: Negative for cough.   Cardiovascular: Negative for chest pain, palpitations and leg swelling.  Gastrointestinal: Negative for nausea, diarrhea, blood in stool and abdominal distention.  Genitourinary: Negative for frequency and hematuria.  Musculoskeletal: Positive for arthralgias. Negative for back pain, gait problem, joint swelling and neck pain.  Skin: Negative for rash.  Neurological: Negative for dizziness, tremors, speech difficulty and weakness.  Psychiatric/Behavioral: Negative for sleep disturbance, dysphoric mood and agitation. The patient is not nervous/anxious.        Objective:   Physical Exam  Constitutional: He is oriented to person, place, and time. He appears well-developed. No distress.  NAD  HENT:  Mouth/Throat: Oropharynx is clear and moist.  Eyes: Conjunctivae are normal. Pupils are equal, round, and reactive to light.  Neck: Normal range of motion. No JVD present. No thyromegaly present.  Cardiovascular: Normal rate, regular rhythm, normal heart sounds and intact distal pulses.  Exam reveals no gallop and no friction rub.   No murmur heard. Pulmonary/Chest: Effort normal and breath sounds normal. No respiratory distress. He has no wheezes. He has no rales. He exhibits no tenderness.  Abdominal: Soft. Bowel sounds are normal. He exhibits no  distension and no mass. There is no tenderness. There is no rebound and no guarding.  Musculoskeletal: Normal range of motion. He exhibits no edema and no tenderness.  L 1st MTP not tender  Lymphadenopathy:    He has no cervical adenopathy.  Neurological: He is alert and oriented to person, place, and time. He has normal reflexes. No cranial nerve deficit. He exhibits normal muscle tone. He displays a negative Romberg sign. Coordination and gait normal.  No meningeal signs  Skin: Skin is warm and dry. No rash noted. No erythema.  Psychiatric: He has a normal mood and affect. His behavior is normal. Judgment and thought content normal.  no ankle edema   Lab Results  Component Value Date   WBC 5.2 04/03/2013   HGB 13.4 04/03/2013   HCT 39.8 04/03/2013   PLT 186.0 04/03/2013   GLUCOSE 103* 04/03/2013   CHOL 154 02/20/2013   TRIG 121.0 02/20/2013   HDL 48.60 02/20/2013   LDLCALC 81 02/20/2013   ALT 30 04/03/2013   AST 20 04/03/2013   NA 139 04/03/2013   K 4.0 04/03/2013   CL 104 04/03/2013   CREATININE 1.1 04/03/2013   BUN 13 04/03/2013   CO2 31 04/03/2013   TSH 0.93 04/03/2013   PSA 7.33* 04/03/2013   INR 0.9 06/15/2007         Assessment:         Plan:

## 2013-10-11 ENCOUNTER — Encounter: Payer: Self-pay | Admitting: Internal Medicine

## 2013-10-11 ENCOUNTER — Ambulatory Visit (INDEPENDENT_AMBULATORY_CARE_PROVIDER_SITE_OTHER): Payer: Medicare Other | Admitting: Internal Medicine

## 2013-10-11 VITALS — BP 170/90 | HR 76 | Temp 98.3°F | Wt 245.4 lb

## 2013-10-11 DIAGNOSIS — E785 Hyperlipidemia, unspecified: Secondary | ICD-10-CM

## 2013-10-11 DIAGNOSIS — G4733 Obstructive sleep apnea (adult) (pediatric): Secondary | ICD-10-CM

## 2013-10-11 DIAGNOSIS — Z23 Encounter for immunization: Secondary | ICD-10-CM

## 2013-10-11 DIAGNOSIS — I10 Essential (primary) hypertension: Secondary | ICD-10-CM

## 2013-10-11 DIAGNOSIS — R609 Edema, unspecified: Secondary | ICD-10-CM

## 2013-10-11 MED ORDER — DILTIAZEM HCL ER BEADS 300 MG PO CP24: 300.0000 mg | ORAL_CAPSULE | Freq: Every day | ORAL | Status: DC

## 2013-10-11 MED ORDER — OLMESARTAN MEDOXOMIL 40 MG PO TABS
ORAL_TABLET | ORAL | Status: DC
Start: 2013-10-11 — End: 2013-12-23

## 2013-10-11 NOTE — Assessment & Plan Note (Signed)
Will increase Tiazac to 300 mg qd

## 2013-10-11 NOTE — Assessment & Plan Note (Signed)
Continue with current prescription therapy as reflected on the Med list.  

## 2013-10-11 NOTE — Assessment & Plan Note (Signed)
On CPAP. ?

## 2013-10-11 NOTE — Assessment & Plan Note (Signed)
Chronic LE -  likely due to CCBs Will watch

## 2013-10-11 NOTE — Progress Notes (Signed)
   Subjective:     HPI  The patient presents for a follow-up of  chronic hypertension, chronic dyslipidemia, gout controlled with medicines  BP Readings from Last 3 Encounters:  10/11/13 170/90  08/07/13 150/90  07/05/13 120/80   Wt Readings from Last 3 Encounters:  10/11/13 245 lb 6.4 oz (111.313 kg)  08/07/13 247 lb (112.038 kg)  07/05/13 248 lb (112.492 kg)     Review of Systems  Constitutional: Negative for appetite change, fatigue and unexpected weight change.  HENT: Negative for congestion, nosebleeds, sneezing, sore throat and trouble swallowing.   Eyes: Negative for itching and visual disturbance.  Respiratory: Negative for cough.   Cardiovascular: Negative for chest pain, palpitations and leg swelling.  Gastrointestinal: Negative for nausea, diarrhea, blood in stool and abdominal distention.  Genitourinary: Negative for frequency and hematuria.  Musculoskeletal: Positive for arthralgias. Negative for back pain, gait problem, joint swelling and neck pain.  Skin: Negative for rash.  Neurological: Negative for dizziness, tremors, speech difficulty and weakness.  Psychiatric/Behavioral: Negative for sleep disturbance, dysphoric mood and agitation. The patient is not nervous/anxious.        Objective:   Physical Exam  Constitutional: He is oriented to person, place, and time. He appears well-developed. No distress.  NAD  HENT:  Mouth/Throat: Oropharynx is clear and moist.  Eyes: Conjunctivae are normal. Pupils are equal, round, and reactive to light.  Neck: Normal range of motion. No JVD present. No thyromegaly present.  Cardiovascular: Normal rate, regular rhythm, normal heart sounds and intact distal pulses.  Exam reveals no gallop and no friction rub.   No murmur heard. Pulmonary/Chest: Effort normal and breath sounds normal. No respiratory distress. He has no wheezes. He has no rales. He exhibits no tenderness.  Abdominal: Soft. Bowel sounds are normal. He  exhibits no distension and no mass. There is no tenderness. There is no rebound and no guarding.  Musculoskeletal: Normal range of motion. He exhibits no edema and no tenderness.  L 1st MTP not tender  Lymphadenopathy:    He has no cervical adenopathy.  Neurological: He is alert and oriented to person, place, and time. He has normal reflexes. No cranial nerve deficit. He exhibits normal muscle tone. He displays a negative Romberg sign. Coordination and gait normal.  No meningeal signs  Skin: Skin is warm and dry. No rash noted. No erythema.  Psychiatric: He has a normal mood and affect. His behavior is normal. Judgment and thought content normal.  trace to no ankle edema B   Lab Results  Component Value Date   WBC 5.2 04/03/2013   HGB 13.4 04/03/2013   HCT 39.8 04/03/2013   PLT 186.0 04/03/2013   GLUCOSE 103* 04/03/2013   CHOL 154 02/20/2013   TRIG 121.0 02/20/2013   HDL 48.60 02/20/2013   LDLCALC 81 02/20/2013   ALT 30 04/03/2013   AST 20 04/03/2013   NA 139 04/03/2013   K 4.0 04/03/2013   CL 104 04/03/2013   CREATININE 1.1 04/03/2013   BUN 13 04/03/2013   CO2 31 04/03/2013   TSH 0.93 04/03/2013   PSA 7.33* 04/03/2013   INR 0.9 06/15/2007         Assessment:         Plan:

## 2013-10-11 NOTE — Progress Notes (Deleted)
Pre visit review using our clinic review tool, if applicable. No additional management support is needed unless otherwise documented below in the visit note. 

## 2013-10-21 ENCOUNTER — Telehealth: Payer: Self-pay | Admitting: *Deleted

## 2013-10-21 NOTE — Telephone Encounter (Signed)
Left msg on triage stating their is an drug interaction between pt lovastatin & diltiazem. Wanting to know is it ok to still fill...Jeffrey Carter

## 2013-10-24 ENCOUNTER — Ambulatory Visit (INDEPENDENT_AMBULATORY_CARE_PROVIDER_SITE_OTHER): Payer: Medicare Other | Admitting: Internal Medicine

## 2013-10-24 ENCOUNTER — Encounter: Payer: Self-pay | Admitting: Internal Medicine

## 2013-10-24 VITALS — BP 130/88 | HR 61 | Temp 98.3°F | Wt 247.4 lb

## 2013-10-24 DIAGNOSIS — Z201 Contact with and (suspected) exposure to tuberculosis: Secondary | ICD-10-CM

## 2013-10-24 DIAGNOSIS — I10 Essential (primary) hypertension: Secondary | ICD-10-CM

## 2013-10-24 NOTE — Patient Instructions (Addendum)
Minimal Blood Pressure Goal= AVERAGE < 140/90;  Ideal is an AVERAGE < 135/85. This AVERAGE should be calculated from @ least 5-7 BP readings taken @ different times of day on different days of week. You should not respond to isolated BP readings , but rather the AVERAGE for that week .Please bring your  blood pressure cuff to office visits to verify that it is reliable.It  can also be checked against the blood pressure device at the pharmacy. Finger or wrist cuffs are not dependable; an arm cuff is.   Because of your history; you should  have tuberculin skin testing. Return early next week for application of skin test.This needs to be read @ 48 hours.

## 2013-10-24 NOTE — Progress Notes (Signed)
   Subjective:    Patient ID: Jeffrey Carter, male    DOB: 1940/03/08, 73 y.o.   MRN: [id number]  HPI   His possible tuberculosis exposure is described is in his wife Jeffrey Carter's chart today. A nephew who had active TB and did not complete full treatment was living with her son and 4 grandchildren for almost 11 months. One of the children has been ill. The children are being evaluated.  He has diltiazem 300 mg listed. This was prescribed at last visit. The pharmacist "wouldn't fill it" because he is also on an ARB ,Benicar. He is not recording his blood pressures at home.  He has no active cardio pulmonary symptoms.    Review of Systems No fever ,chills , sweats, or unexplained weight loss. There is no significant cough, sputum production, wheezing,or  paroxysmal nocturnal dyspnea.  Chest pain, palpitations, tachycardia, exertional dyspnea, paroxysmal nocturnal dyspnea, claudication or edema are absent.          Objective:   Physical Exam General appearance: pattern alopecia. He is adequately nourished; no acute distress or increased work of breathing is present.  No  lymphadenopathy about the head, neck, or axilla noted. Very ticklish  Eyes: No conjunctival inflammation or lid edema is present. There is no scleral icterus.  Ears:  External ear exam shows no significant lesions or deformities.  Wax bilaterally  Nose:  External nasal examination shows no deformity or inflammation. Nasal mucosa are pink and moist without lesions or exudates. No septal dislocation or deviation.No obstruction to airflow.   Oral exam: Dental hygiene is good; lips and gums are healthy appearing.There is no oropharyngeal erythema or exudate noted.   Neck:  No deformities, thyromegaly, masses, or tenderness noted.     Heart:  Normal rate and regular rhythm. S1 and S2 normal without gallop, murmur, click, rub or other extra sounds.   Lungs:Chest clear to auscultation; no wheezes, rhonchi,rales ,or rubs  present.No increased work of breathing.    Abdomen protuberant  Extremities:  No cyanosis, edema, or clubbing  noted    All pulses intact without  bruits .No ischemic skin changes.   Skin: Warm & dry w/o jaundice or tenting.        Assessment & Plan:  #1 TB exposure #2 HTN controlled w/o CCB @ this time  See AVS

## 2013-10-24 NOTE — Progress Notes (Signed)
Pre visit review using our clinic review tool, if applicable. No additional management support is needed unless otherwise documented below in the visit note. 

## 2013-10-28 ENCOUNTER — Telehealth: Payer: Self-pay | Admitting: Internal Medicine

## 2013-10-28 NOTE — Telephone Encounter (Signed)
Patient states he went to pharmacy to fill tiazac.  Pharmacy told him that they didn't think it would be safe to take with lovastatin.  Patient is requesting call in regards.

## 2013-10-29 ENCOUNTER — Telehealth: Payer: Self-pay

## 2013-10-29 ENCOUNTER — Ambulatory Visit: Payer: Medicare Other | Admitting: *Deleted

## 2013-10-29 DIAGNOSIS — Z111 Encounter for screening for respiratory tuberculosis: Secondary | ICD-10-CM

## 2013-10-29 LAB — TB SKIN TEST
Induration: 0 mm
TB Skin Test: NEGATIVE

## 2013-10-29 MED ORDER — TUBERCULIN PPD 5 UNIT/0.1ML ID SOLN
5.0000 [IU] | Freq: Once | INTRADERMAL | Status: AC
  Administered 2013-10-29: 5 [IU] via INTRADERMAL

## 2013-10-29 MED ORDER — AMLODIPINE BESYLATE 5 MG PO TABS: 5.0000 mg | ORAL_TABLET | Freq: Every day | ORAL | Status: DC

## 2013-10-29 MED ORDER — LOVASTATIN 40 MG PO TABS: 40.0000 mg | ORAL_TABLET | Freq: Every day | ORAL | Status: DC

## 2013-10-29 NOTE — Telephone Encounter (Signed)
We can switch to Amlodipine (from Tiazac) - see Rx Thx

## 2013-10-29 NOTE — Telephone Encounter (Signed)
Notified pt with md response.../lmb 

## 2013-10-29 NOTE — Telephone Encounter (Signed)
pls see previous tel message Thx

## 2013-10-29 NOTE — Telephone Encounter (Signed)
Received refill request for  Diltiazem 300mg  24 hr capsule, pharmacy states that the pt is also taking lovastatin 40 mg and that there is a drug interaction between the diltiazem and the lovastatin.  They would like for you to change the lovastatin to lipitor or please call for suggestions.  Also, diltiazem co-pay is too expensive for the pt, please advise

## 2013-12-04 ENCOUNTER — Ambulatory Visit (INDEPENDENT_AMBULATORY_CARE_PROVIDER_SITE_OTHER): Payer: Medicare Other | Admitting: Pulmonary Disease

## 2013-12-04 ENCOUNTER — Encounter: Payer: Self-pay | Admitting: Pulmonary Disease

## 2013-12-04 VITALS — BP 124/68 | HR 65 | Temp 97.6°F | Ht 69.0 in | Wt 245.6 lb

## 2013-12-04 DIAGNOSIS — G4733 Obstructive sleep apnea (adult) (pediatric): Secondary | ICD-10-CM

## 2013-12-04 NOTE — Progress Notes (Signed)
   Subjective:    Patient ID: OLUWADAMILARE TOBLER, male    DOB: 02-22-1940, 73 y.o.   MRN: [id number]  HPI The patient comes in today for follow-up of his obstructive sleep apnea. He is wearing CPAP compliantly, and his download shows excellent control of his AHI with no significant mask leak. He is having no issues with his mask fit or pressure, and sleeps well with the device. He is satisfied with his daytime alertness. Of note, his weight is unchanged from the last visit.   Review of Systems  Constitutional: Negative for fever and unexpected weight change.  HENT: Negative for congestion, dental problem, ear pain, nosebleeds, postnasal drip, rhinorrhea, sinus pressure, sneezing, sore throat and trouble swallowing.   Eyes: Negative for redness and itching.  Respiratory: Negative for cough, chest tightness, shortness of breath and wheezing.   Cardiovascular: Negative for palpitations and leg swelling.  Gastrointestinal: Negative for nausea and vomiting.  Genitourinary: Negative for dysuria.  Musculoskeletal: Negative for joint swelling.  Skin: Negative for rash.  Neurological: Negative for headaches.  Hematological: Does not bruise/bleed easily.  Psychiatric/Behavioral: Negative for dysphoric mood. The patient is not nervous/anxious.        Objective:   Physical Exam Obese male in no acute distress Nose without purulence or discharge noted No skin breakdown or pressure necrosis from the C Pap mask Neck without lymphadenopathy or thyromegaly Lower extremities with mild edema, no cyanosis Alert and oriented, moves all 4 extremities.       Assessment & Plan:

## 2013-12-04 NOTE — Patient Instructions (Signed)
Stay on cpap, and keep up with mask changes and supplies. Work on weight loss followup with me again in one year 

## 2013-12-04 NOTE — Assessment & Plan Note (Signed)
The patient is doing very well from a sleep apnea standpoint by his download. His AHI is well controlled, and he is having no significant mask leak. He also feels this translates into improved sleep at night, and adequate daytime alertness. I have asked him to keep up with his mask changes and supplies, and to work aggressively on weight loss.

## 2013-12-11 ENCOUNTER — Encounter: Payer: Self-pay | Admitting: Internal Medicine

## 2013-12-11 ENCOUNTER — Other Ambulatory Visit: Payer: Medicare Other

## 2013-12-11 ENCOUNTER — Ambulatory Visit (INDEPENDENT_AMBULATORY_CARE_PROVIDER_SITE_OTHER): Payer: Medicare Other | Admitting: Internal Medicine

## 2013-12-11 VITALS — BP 140/85 | HR 85 | Temp 98.3°F | Wt 247.0 lb

## 2013-12-11 DIAGNOSIS — R972 Elevated prostate specific antigen [PSA]: Secondary | ICD-10-CM

## 2013-12-11 DIAGNOSIS — M109 Gout, unspecified: Secondary | ICD-10-CM

## 2013-12-11 DIAGNOSIS — I10 Essential (primary) hypertension: Secondary | ICD-10-CM

## 2013-12-11 DIAGNOSIS — M1000 Idiopathic gout, unspecified site: Secondary | ICD-10-CM

## 2013-12-11 NOTE — Assessment & Plan Note (Signed)
Resolved

## 2013-12-11 NOTE — Progress Notes (Signed)
   Subjective:     HPI  The patient presents for a follow-up of  chronic hypertension, chronic dyslipidemia, gout controlled with medicines, elevated PSA  BP Readings from Last 3 Encounters:  12/11/13 140/85  12/04/13 124/68  10/24/13 130/88   Wt Readings from Last 3 Encounters:  12/11/13 247 lb (112.038 kg)  12/04/13 245 lb 9.6 oz (111.403 kg)  10/24/13 247 lb 6 oz (112.209 kg)     Review of Systems  Constitutional: Negative for appetite change, fatigue and unexpected weight change.  HENT: Negative for congestion, nosebleeds, sneezing, sore throat and trouble swallowing.   Eyes: Negative for itching and visual disturbance.  Respiratory: Negative for cough.   Cardiovascular: Negative for chest pain, palpitations and leg swelling.  Gastrointestinal: Negative for nausea, diarrhea, blood in stool and abdominal distention.  Genitourinary: Negative for frequency and hematuria.  Musculoskeletal: Positive for arthralgias. Negative for back pain, joint swelling, gait problem and neck pain.  Skin: Negative for rash.  Neurological: Negative for dizziness, tremors, speech difficulty and weakness.  Psychiatric/Behavioral: Negative for sleep disturbance, dysphoric mood and agitation. The patient is not nervous/anxious.        Objective:   Physical Exam  Constitutional: He is oriented to person, place, and time. He appears well-developed. No distress.  NAD  HENT:  Mouth/Throat: Oropharynx is clear and moist.  Eyes: Conjunctivae are normal. Pupils are equal, round, and reactive to light.  Neck: Normal range of motion. No JVD present. No thyromegaly present.  Cardiovascular: Normal rate, regular rhythm, normal heart sounds and intact distal pulses.  Exam reveals no gallop and no friction rub.   No murmur heard. Pulmonary/Chest: Effort normal and breath sounds normal. No respiratory distress. He has no wheezes. He has no rales. He exhibits no tenderness.  Abdominal: Soft. Bowel sounds  are normal. He exhibits no distension and no mass. There is no tenderness. There is no rebound and no guarding.  Musculoskeletal: Normal range of motion. He exhibits no edema or tenderness.  Lymphadenopathy:    He has no cervical adenopathy.  Neurological: He is alert and oriented to person, place, and time. He has normal reflexes. No cranial nerve deficit. He exhibits normal muscle tone. He displays a negative Romberg sign. Coordination and gait normal.  No meningeal signs  Skin: Skin is warm and dry. No rash noted.  Psychiatric: He has a normal mood and affect. His behavior is normal. Judgment and thought content normal.  trace to no ankle edema B Obese  Lab Results  Component Value Date   WBC 5.2 04/03/2013   HGB 13.4 04/03/2013   HCT 39.8 04/03/2013   PLT 186.0 04/03/2013   GLUCOSE 103* 04/03/2013   CHOL 154 02/20/2013   TRIG 121.0 02/20/2013   HDL 48.60 02/20/2013   LDLCALC 81 02/20/2013   ALT 30 04/03/2013   AST 20 04/03/2013   NA 139 04/03/2013   K 4.0 04/03/2013   CL 104 04/03/2013   CREATININE 1.1 04/03/2013   BUN 13 04/03/2013   CO2 31 04/03/2013   TSH 0.93 04/03/2013   PSA 7.33* 04/03/2013   INR 0.9 06/15/2007         Assessment:         Plan:

## 2013-12-11 NOTE — Assessment & Plan Note (Signed)
Free PSA 

## 2013-12-11 NOTE — Assessment & Plan Note (Signed)
Continue with current prescription therapy as reflected on the Med list.  

## 2013-12-11 NOTE — Progress Notes (Signed)
Pre visit review using our clinic review tool, if applicable. No additional management support is needed unless otherwise documented below in the visit note. 

## 2013-12-12 LAB — PSA, TOTAL AND FREE
PSA, Free Pct: 18 % — ABNORMAL LOW (ref >25)
PSA, Free: 0.96 ng/mL
PSA: 5.25 ng/mL — ABNORMAL HIGH (ref <=4.00)

## 2013-12-23 ENCOUNTER — Telehealth: Payer: Self-pay | Admitting: Internal Medicine

## 2013-12-23 MED ORDER — LOSARTAN POTASSIUM 100 MG PO TABS: 100.0000 mg | ORAL_TABLET | Freq: Every day | ORAL | Status: DC

## 2013-12-23 NOTE — Telephone Encounter (Signed)
Pt came by requesting a change in his Benicar 40mg  TAB SAN.  He states the RX is very expensive and was wondering if he could be put on Losartan/HCTZ which is what his spouse takes.

## 2013-12-23 NOTE — Telephone Encounter (Signed)
Ok Done Thx 

## 2013-12-24 NOTE — Telephone Encounter (Signed)
Left detailed mess informing pt.  

## 2013-12-31 ENCOUNTER — Telehealth: Payer: Self-pay | Admitting: Internal Medicine

## 2013-12-31 DIAGNOSIS — R972 Elevated prostate specific antigen [PSA]: Secondary | ICD-10-CM

## 2013-12-31 NOTE — Telephone Encounter (Signed)
Called pt back spoke with wife md is going to refer him to see urologist. They will be contacted by [facility] once appt has been set-up...Jeffrey Carter

## 2013-12-31 NOTE — Telephone Encounter (Signed)
Patient states Dr. Camila Li was to refer him to another provider.  He is not sure what kind of provider.  States it was something to do with his PSA.  Please contact in regards.

## 2014-01-06 ENCOUNTER — Encounter: Payer: Self-pay | Admitting: Internal Medicine

## 2014-01-11 ENCOUNTER — Other Ambulatory Visit: Payer: Self-pay | Admitting: Internal Medicine

## 2014-01-11 DIAGNOSIS — R972 Elevated prostate specific antigen [PSA]: Secondary | ICD-10-CM

## 2014-01-22 ENCOUNTER — Ambulatory Visit (INDEPENDENT_AMBULATORY_CARE_PROVIDER_SITE_OTHER): Payer: Medicare Other | Admitting: Internal Medicine

## 2014-01-22 ENCOUNTER — Encounter: Payer: Self-pay | Admitting: Internal Medicine

## 2014-01-22 VITALS — BP 150/90 | HR 67 | Temp 98.2°F | Ht 69.0 in | Wt 243.0 lb

## 2014-01-22 DIAGNOSIS — H60399 Other infective otitis externa, unspecified ear: Secondary | ICD-10-CM | POA: Insufficient documentation

## 2014-01-22 DIAGNOSIS — I10 Essential (primary) hypertension: Secondary | ICD-10-CM

## 2014-01-22 DIAGNOSIS — H60392 Other infective otitis externa, left ear: Secondary | ICD-10-CM

## 2014-01-22 MED ORDER — NEOMYCIN-POLYMYXIN-HC 3.5-10000-1 OT SOLN: 4.0000 [drp] | Freq: Four times a day (QID) | OTIC | Status: DC

## 2014-01-22 NOTE — Assessment & Plan Note (Signed)
Otic cortisporin x 7 d

## 2014-01-22 NOTE — Progress Notes (Signed)
   Subjective:     HPI  C/o L ear clogged up x 1 week  The patient presents for a follow-up of  chronic hypertension, chronic dyslipidemia, gout controlled with medicines, elevated PSA  BP Readings from Last 3 Encounters:  01/22/14 150/90  12/11/13 140/85  12/04/13 124/68   Wt Readings from Last 3 Encounters:  01/22/14 243 lb (110.224 kg)  12/11/13 247 lb (112.038 kg)  12/04/13 245 lb 9.6 oz (111.403 kg)     Review of Systems  Constitutional: Negative for appetite change, fatigue and unexpected weight change.  HENT: Negative for congestion, nosebleeds, sneezing, sore throat and trouble swallowing.   Eyes: Negative for itching and visual disturbance.  Respiratory: Negative for cough.   Cardiovascular: Negative for chest pain, palpitations and leg swelling.  Gastrointestinal: Negative for nausea, diarrhea, blood in stool and abdominal distention.  Genitourinary: Negative for frequency and hematuria.  Musculoskeletal: Positive for arthralgias. Negative for back pain, joint swelling, gait problem and neck pain.  Skin: Negative for rash.  Neurological: Negative for dizziness, tremors, speech difficulty and weakness.  Psychiatric/Behavioral: Negative for sleep disturbance, dysphoric mood and agitation. The patient is not nervous/anxious.        Objective:   Physical Exam  Constitutional: He is oriented to person, place, and time. He appears well-developed. No distress.  NAD  HENT:  Mouth/Throat: Oropharynx is clear and moist.  Eyes: Conjunctivae are normal. Pupils are equal, round, and reactive to light.  Neck: Normal range of motion. No JVD present. No thyromegaly present.  Cardiovascular: Normal rate, regular rhythm, normal heart sounds and intact distal pulses.  Exam reveals no gallop and no friction rub.   No murmur heard. Pulmonary/Chest: Effort normal and breath sounds normal. No respiratory distress. He has no wheezes. He has no rales. He exhibits no tenderness.   Abdominal: Soft. Bowel sounds are normal. He exhibits no distension and no mass. There is no tenderness. There is no rebound and no guarding.  Musculoskeletal: Normal range of motion. He exhibits no edema or tenderness.  Lymphadenopathy:    He has no cervical adenopathy.  Neurological: He is alert and oriented to person, place, and time. He has normal reflexes. No cranial nerve deficit. He exhibits normal muscle tone. He displays a negative Romberg sign. Coordination and gait normal.  No meningeal signs  Skin: Skin is warm and dry. No rash noted.  Psychiatric: He has a normal mood and affect. His behavior is normal. Judgment and thought content normal.  trace to no ankle edema B Obese R ear wax - removed w/a loop L ear canal is swollen w/OE  Lab Results  Component Value Date   WBC 5.2 04/03/2013   HGB 13.4 04/03/2013   HCT 39.8 04/03/2013   PLT 186.0 04/03/2013   GLUCOSE 103* 04/03/2013   CHOL 154 02/20/2013   TRIG 121.0 02/20/2013   HDL 48.60 02/20/2013   LDLCALC 81 02/20/2013   ALT 30 04/03/2013   AST 20 04/03/2013   NA 139 04/03/2013   K 4.0 04/03/2013   CL 104 04/03/2013   CREATININE 1.1 04/03/2013   BUN 13 04/03/2013   CO2 31 04/03/2013   TSH 0.93 04/03/2013   PSA 5.25* 12/11/2013   INR 0.9 06/15/2007         Assessment:         Plan:

## 2014-01-22 NOTE — Assessment & Plan Note (Signed)
Continue with current prescription therapy as reflected on the Med list.  

## 2014-01-29 ENCOUNTER — Other Ambulatory Visit: Payer: Medicare Other

## 2014-01-29 ENCOUNTER — Ambulatory Visit (INDEPENDENT_AMBULATORY_CARE_PROVIDER_SITE_OTHER): Payer: Medicare Other | Admitting: Family

## 2014-01-29 ENCOUNTER — Encounter: Payer: Self-pay | Admitting: Family

## 2014-01-29 VITALS — BP 144/98 | HR 59 | Temp 98.9°F | Resp 18 | Ht 69.0 in | Wt 242.8 lb

## 2014-01-29 DIAGNOSIS — H9202 Otalgia, left ear: Secondary | ICD-10-CM | POA: Diagnosis not present

## 2014-01-29 DIAGNOSIS — L919 Hypertrophic disorder of the skin, unspecified: Secondary | ICD-10-CM | POA: Diagnosis not present

## 2014-01-29 DIAGNOSIS — H60392 Other infective otitis externa, left ear: Secondary | ICD-10-CM

## 2014-01-29 DIAGNOSIS — H9222 Otorrhagia, left ear: Secondary | ICD-10-CM | POA: Diagnosis not present

## 2014-01-29 DIAGNOSIS — H938X2 Other specified disorders of left ear: Secondary | ICD-10-CM | POA: Diagnosis not present

## 2014-01-29 DIAGNOSIS — H938X9 Other specified disorders of ear, unspecified ear: Secondary | ICD-10-CM | POA: Insufficient documentation

## 2014-01-29 MED ORDER — NEOMYCIN-POLYMYXIN-HC 3.5-10000-1 OT SOLN: 4.0000 [drp] | Freq: Four times a day (QID) | OTIC | Status: DC

## 2014-01-29 NOTE — Assessment & Plan Note (Signed)
Undentifiable mass noted in left ear, and was removed by consulting physician. Mass will be sent for pathology and microscopic evaluation to identify mass.

## 2014-01-29 NOTE — Assessment & Plan Note (Addendum)
Patient continues to report inflammation of left ear. Continue Cortisporin drops as prescribed. Medication refilled. Patient instructed on ear wax removal. Follow up if symptoms worsen or fail to improve.

## 2014-01-29 NOTE — Progress Notes (Signed)
Subjective:    Patient ID: Jeffrey Carter, male    DOB: 12-09-1940, 74 y.o.   MRN: [id number]  Chief Complaint  Patient presents with  . Medication Refill    needs ear drops refilled, says he has blood coming out of his left ear  . Diabetes    HPI:  Jeffrey Carter is a 74 y.o. male who presents today for follow for ear pain.  Was recently seen in the office for otitis externa and was prescribed Cortisporin 7 days. Indicates he has been using the drops as prescribed. Indicates improvement, but has run out of the medication and believes he is not fully better. Does note that his wife saw a small amount of blood on the cotton ball following removal after ear drops.   No Known Allergies  Current Outpatient Prescriptions on File Prior to Visit  Medication Sig Dispense Refill  . amLODipine (NORVASC) 5 MG tablet Take 1 tablet (5 mg total) by mouth daily. 30 tablet 11  . aspirin 81 MG tablet Take 81 mg by mouth daily.      . cholecalciferol (VITAMIN D) 1000 UNITS tablet Take 1 tablet (1,000 Units total) by mouth daily. 100 tablet 3  . cloNIDine (CATAPRES) 0.2 MG tablet Take 1 tablet (0.2 mg total) by mouth 2 (two) times daily. 60 tablet 11  . finasteride (PROSCAR) 5 MG tablet Take 1 tablet (5 mg total) by mouth daily. 30 tablet 11  . furosemide (LASIX) 40 MG tablet TAKE ONE TABLET BY MOUTH ONCE DAILY 30 tablet 11  . indomethacin (INDOCIN) 50 MG capsule Take 1 capsule (50 mg total) by mouth 3 (three) times daily as needed (gout). 15 capsule 0  . losartan (COZAAR) 100 MG tablet Take 1 tablet (100 mg total) by mouth daily. 90 tablet 3  . lovastatin (MEVACOR) 40 MG tablet Take 1 tablet (40 mg total) by mouth daily at 6 PM. 30 tablet 11  . neomycin-polymyxin-hydrocortisone (CORTISPORIN) otic solution Place 4 drops into the left ear 4 (four) times daily. 10 mL 0  . oxymetazoline (AFRIN NASAL SPRAY) 0.05 % nasal spray Place 2 sprays into the nose 2 (two) times daily. (Patient taking  differently: Place 2 sprays into the nose 2 (two) times daily as needed. ) 30 mL 0  . sildenafil (REVATIO) 20 MG tablet Take 1-2 tablets (20-40 mg total) by mouth daily as needed. 60 tablet 3   No current facility-administered medications on file prior to visit.    Review of Systems  Constitutional: Negative for fever and chills.  HENT: Positive for ear discharge (small amount of blood). Negative for ear pain and tinnitus.       Objective:    BP 144/98 mmHg  Pulse 59  Temp(Src) 98.9 F (37.2 C) (Oral)  Resp 18  Ht 5\' 9"  (1.753 m)  Wt 242 lb 12.8 oz (110.133 kg)  BMI 35.84 kg/m2  SpO2 98% Nursing note and vital signs reviewed.  Physical Exam  Constitutional: He is oriented to person, place, and time. He appears well-developed and well-nourished. No distress.  HENT:  Right Ear: Hearing, tympanic membrane, external ear and ear canal normal.  Left Ear: There is swelling.  Right ear-mild amount of cerumen noted.  Left ear-small white mass noted at entry to ear. Soft to the touch with otoscope speculum and loop and appeared anchored to ear. No obvious drainage noted.  Cardiovascular: Normal rate, regular rhythm, normal heart sounds and intact distal pulses.   Pulmonary/Chest: Effort normal  and breath sounds normal.  Neurological: He is alert and oriented to person, place, and time.  Skin: Skin is warm and dry.  Psychiatric: He has a normal mood and affect. His behavior is normal. Judgment and thought content normal.       Assessment & Plan:

## 2014-01-29 NOTE — Patient Instructions (Signed)
Thank you for choosing [facility].  Summary/Instructions:  Your prescription(s) have been submitted to your pharmacy or been printed and provided for you. Please take as directed and contact our office if you believe you are having problem(s) with the medication(s) or have any questions.  If your symptoms worsen or fail to improve, please contact our office for further instruction, or in case of emergency go directly to the emergency room at the closest medical facility.    Otitis Externa Otitis externa is a bacterial or fungal infection of the outer ear canal. This is the area from the eardrum to the outside of the ear. Otitis externa is sometimes called "swimmer's ear." CAUSES  Possible causes of infection include:  Swimming in dirty water.  Moisture remaining in the ear after swimming or bathing.  Mild injury (trauma) to the ear.  Objects stuck in the ear (foreign body).  Cuts or scrapes (abrasions) on the outside of the ear. SIGNS AND SYMPTOMS  The first symptom of infection is often itching in the ear canal. Later signs and symptoms may include swelling and redness of the ear canal, ear pain, and yellowish-white fluid (pus) coming from the ear. The ear pain may be worse when pulling on the earlobe. DIAGNOSIS  Your health care provider will perform a physical exam. A sample of fluid may be taken from the ear and examined for bacteria or fungi. TREATMENT  Antibiotic ear drops are often given for 10 to 14 days. Treatment may also include pain medicine or corticosteroids to reduce itching and swelling. HOME CARE INSTRUCTIONS   Apply antibiotic ear drops to the ear canal as prescribed by your health care provider.  Take medicines only as directed by your health care provider.  If you have diabetes, follow any additional treatment instructions from your health care provider.  Keep all follow-up visits as directed by your health care provider. PREVENTION   Keep your ear  dry. Use the corner of a towel to absorb water out of the ear canal after swimming or bathing.  Avoid scratching or putting objects inside your ear. This can damage the ear canal or remove the protective wax that lines the canal. This makes it easier for bacteria and fungi to grow.  Avoid swimming in lakes, polluted water, or poorly chlorinated pools.  You may use ear drops made of rubbing alcohol and vinegar after swimming. Combine equal parts of white vinegar and alcohol in a bottle. Put 3 or 4 drops into each ear after swimming. SEEK MEDICAL CARE IF:   You have a fever.  Your ear is still red, swollen, painful, or draining pus after 3 days.  Your redness, swelling, or pain gets worse.  You have a severe headache.  You have redness, swelling, pain, or tenderness in the area behind your ear. MAKE SURE YOU:   Understand these instructions.  Will watch your condition.  Will get help right away if you are not doing well or get worse. Document Released: 01/10/2005 Document Revised: 05/27/2013 Document Reviewed: 01/27/2011 [facility] Patient Information 2015 [place], [place]. This information is not intended to replace advice given to you by your health care provider. Make sure you discuss any questions you have with your health care provider.

## 2014-01-29 NOTE — Progress Notes (Signed)
Pre visit review using our clinic review tool, if applicable. No additional management support is needed unless otherwise documented below in the visit note. 

## 2014-01-31 LAB — PATHOLOGY

## 2014-02-03 ENCOUNTER — Telehealth: Payer: Self-pay | Admitting: Family

## 2014-02-03 DIAGNOSIS — R3913 Splitting of urinary stream: Secondary | ICD-10-CM | POA: Diagnosis not present

## 2014-02-03 DIAGNOSIS — N401 Enlarged prostate with lower urinary tract symptoms: Secondary | ICD-10-CM | POA: Diagnosis not present

## 2014-02-03 DIAGNOSIS — R351 Nocturia: Secondary | ICD-10-CM | POA: Diagnosis not present

## 2014-02-03 DIAGNOSIS — R972 Elevated prostate specific antigen [PSA]: Secondary | ICD-10-CM | POA: Diagnosis not present

## 2014-02-03 NOTE — Telephone Encounter (Signed)
Pt aware.

## 2014-02-03 NOTE — Telephone Encounter (Signed)
Please inform patient that object removed from his left ear was a polyp. We will continue to monitor this as needed, but no further treatment is necessary at this time.

## 2014-02-12 DIAGNOSIS — R972 Elevated prostate specific antigen [PSA]: Secondary | ICD-10-CM | POA: Diagnosis not present

## 2014-02-22 DIAGNOSIS — G4733 Obstructive sleep apnea (adult) (pediatric): Secondary | ICD-10-CM | POA: Diagnosis not present

## 2014-03-06 ENCOUNTER — Encounter (HOSPITAL_COMMUNITY): Payer: Self-pay | Admitting: Emergency Medicine

## 2014-03-06 ENCOUNTER — Emergency Department (HOSPITAL_COMMUNITY)
Admission: EM | Admit: 2014-03-06 | Discharge: 2014-03-06 | Disposition: A | Discharge status: Home / Self Care | Payer: Medicare Other | Attending: Emergency Medicine | Admitting: Emergency Medicine

## 2014-03-06 DIAGNOSIS — M545 Low back pain, unspecified: Secondary | ICD-10-CM

## 2014-03-06 DIAGNOSIS — M1990 Unspecified osteoarthritis, unspecified site: Secondary | ICD-10-CM | POA: Insufficient documentation

## 2014-03-06 DIAGNOSIS — Z7982 Long term (current) use of aspirin: Secondary | ICD-10-CM | POA: Insufficient documentation

## 2014-03-06 DIAGNOSIS — I10 Essential (primary) hypertension: Secondary | ICD-10-CM | POA: Diagnosis not present

## 2014-03-06 DIAGNOSIS — Z87891 Personal history of nicotine dependence: Secondary | ICD-10-CM | POA: Insufficient documentation

## 2014-03-06 DIAGNOSIS — E785 Hyperlipidemia, unspecified: Secondary | ICD-10-CM | POA: Insufficient documentation

## 2014-03-06 DIAGNOSIS — Z79899 Other long term (current) drug therapy: Secondary | ICD-10-CM | POA: Diagnosis not present

## 2014-03-06 DIAGNOSIS — Z791 Long term (current) use of non-steroidal anti-inflammatories (NSAID): Secondary | ICD-10-CM | POA: Diagnosis not present

## 2014-03-06 MED ORDER — OXYCODONE-ACETAMINOPHEN 5-325 MG PO TABS: 1.0000 | ORAL_TABLET | Freq: Three times a day (TID) | ORAL | Status: DC | PRN

## 2014-03-06 MED ORDER — HYDROMORPHONE HCL 1 MG/ML IJ SOLN
1.0000 mg | Freq: Once | INTRAMUSCULAR | Status: AC
  Administered 2014-03-06: 1 mg via INTRAMUSCULAR
  Filled 2014-03-06: qty 1

## 2014-03-06 MED ORDER — DIAZEPAM 5 MG PO TABS
5.0000 mg | ORAL_TABLET | Freq: Once | ORAL | Status: AC
  Administered 2014-03-06: 5 mg via ORAL
  Filled 2014-03-06: qty 1

## 2014-03-06 MED ORDER — DIAZEPAM 5 MG PO TABS: 5.0000 mg | ORAL_TABLET | Freq: Two times a day (BID) | ORAL | Status: DC | PRN

## 2014-03-06 NOTE — ED Provider Notes (Signed)
CSN: [id number]     Arrival date & time 03/06/14  1301 History   First MD Initiated Contact with Patient 03/06/14 1307     Chief Complaint  Patient presents with  . Back Pain     (Consider location/radiation/quality/duration/timing/severity/associated sxs/prior Treatment) Patient is a 74 y.o. male presenting with back pain. The history is provided by the patient.  Back Pain Location:  Lumbar spine Quality:  Aching Radiates to:  Does not radiate Pain severity:  Severe Pain is:  Same all the time Onset quality:  Sudden Timing:  Constant Progression:  Unchanged Chronicity:  Recurrent Context: twisting   Context: not jumping from heights   Relieved by:  Nothing Worsened by:  Nothing tried Ineffective treatments:  Bed rest and narcotics Associated symptoms: no abdominal pain and no fever     Past Medical History  Diagnosis Date  . Hypertension   . Osteoarthritis   . Back pain   . Hyperlipidemia    Past Surgical History  Procedure Laterality Date  . Appendectomy    . Tonsillectomy    . Total knee arthroplasty      Right 2009   Family History  Problem Relation Age of Onset  . Cancer Brother     pancreatic, stomach   History  Substance Use Topics  . Smoking status: Former Smoker -- 0.25 packs/day for 8 years    Types: Cigarettes    Quit date: 01/24/1962  . Smokeless tobacco: Never Used     Comment: quit at age 87  . Alcohol Use: No    Review of Systems  Constitutional: Negative for fever.  Respiratory: Negative for cough and shortness of breath.   Gastrointestinal: Negative for vomiting and abdominal pain.  Genitourinary: Negative for difficulty urinating.  Musculoskeletal: Positive for back pain.  All other systems reviewed and are negative.     Allergies  Review of patient's allergies indicates no known allergies.  Home Medications   Prior to Admission medications   Medication Sig Start Date End Date Taking? Authorizing Provider  amLODipine  (NORVASC) 5 MG tablet Take 1 tablet (5 mg total) by mouth daily. 10/29/13   Aleksei Plotnikov V, MD  aspirin 81 MG tablet Take 81 mg by mouth daily.      Historical Provider, MD  cholecalciferol (VITAMIN D) 1000 UNITS tablet Take 1 tablet (1,000 Units total) by mouth daily. 04/03/13 04/03/14  Aleksei Plotnikov V, MD  cloNIDine (CATAPRES) 0.2 MG tablet Take 1 tablet (0.2 mg total) by mouth 2 (two) times daily. 08/07/13   Aleksei Plotnikov V, MD  finasteride (PROSCAR) 5 MG tablet Take 1 tablet (5 mg total) by mouth daily. 02/20/13   Neena Rhymes, MD  furosemide (LASIX) 40 MG tablet TAKE ONE TABLET BY MOUTH ONCE DAILY 03/04/13   Neena Rhymes, MD  indomethacin (INDOCIN) 50 MG capsule Take 1 capsule (50 mg total) by mouth 3 (three) times daily as needed (gout). 04/03/13   Aleksei Plotnikov V, MD  losartan (COZAAR) 100 MG tablet Take 1 tablet (100 mg total) by mouth daily. 12/23/13   Aleksei Plotnikov V, MD  lovastatin (MEVACOR) 40 MG tablet Take 1 tablet (40 mg total) by mouth daily at 6 PM. 10/29/13   Lew Dawes V, MD  neomycin-polymyxin-hydrocortisone (CORTISPORIN) otic solution Place 4 drops into the left ear 4 (four) times daily. 01/29/14   Mauricio Po, FNP  oxymetazoline (AFRIN NASAL SPRAY) 0.05 % nasal spray Place 2 sprays into the nose 2 (two) times daily. Patient taking differently:  Place 2 sprays into the nose 2 (two) times daily as needed.  11/29/11   Clayton Bibles, PA-C  sildenafil (REVATIO) 20 MG tablet Take 1-2 tablets (20-40 mg total) by mouth daily as needed. 08/07/13   Aleksei Plotnikov V, MD   BP 142/72 mmHg  Pulse 66  Resp 20  SpO2 97% Physical Exam  Constitutional: He is oriented to person, place, and time. He appears well-developed and well-nourished. No distress.  HENT:  Head: Normocephalic and atraumatic.  Mouth/Throat: Oropharynx is clear and moist. No oropharyngeal exudate.  Eyes: EOM are normal. Pupils are equal, round, and reactive to light.  Neck: Normal range of  motion. Neck supple.  Cardiovascular: Normal rate and regular rhythm.  Exam reveals no friction rub.   No murmur heard. Pulmonary/Chest: Effort normal and breath sounds normal. No respiratory distress. He has no wheezes. He has no rales.  Abdominal: He exhibits no distension. There is no tenderness. There is no rebound.  Musculoskeletal: Normal range of motion. He exhibits no edema.       Lumbar back: He exhibits spasm (Large amount, upper buttock).  Neurological: He is alert and oriented to person, place, and time. No cranial nerve deficit. He exhibits normal muscle tone. Coordination normal.  Skin: No rash noted. He is not diaphoretic.  Nursing note and vitals reviewed.   ED Course  Procedures (including critical care time) Labs Review Labs Reviewed - No data to display  Imaging Review No results found.   EKG Interpretation None      MDM   Final diagnoses:  Right-sided low back pain without sciatica    31M presents with lower back. Began when he got up from the bed this morning, worse with movement. No relief with pain meds. No fevers, N/V. No weakness/numbness, no loss of bowel/bladder function. Doubt cord compression. Large amounts of muscle spasm in upper buttock on the R. Will give valium and dilaudid.  On re-assessment after meds, doing much better. Mobility increased. Stable for discharge.    Evelina Bucy, MD 03/07/14 [phone]

## 2014-03-06 NOTE — ED Notes (Signed)
Pt comfortable with discharge and follow up instructions. Prescriptions x2. 

## 2014-03-06 NOTE — ED Notes (Signed)
Pt arrives EMS from home with c/o low back pain that began last night. Denies weakness, numbness, loss of bowel/bladder function. Strength good in lower extremities. VSS in transport.

## 2014-03-06 NOTE — Discharge Instructions (Signed)
Back Pain, Adult Low back pain is very common. About 1 in 5 people have back pain.The cause of low back pain is rarely dangerous. The pain often gets better over time.About half of people with a sudden onset of back pain feel better in just 2 weeks. About 8 in 10 people feel better by 6 weeks.  CAUSES Some common causes of back pain include:  Strain of the muscles or ligaments supporting the spine.  Wear and tear (degeneration) of the spinal discs.  Arthritis.  Direct injury to the back. DIAGNOSIS Most of the time, the direct cause of low back pain is not known.However, back pain can be treated effectively even when the exact cause of the pain is unknown.Answering your caregiver's questions about your overall health and symptoms is one of the most accurate ways to make sure the cause of your pain is not dangerous. If your caregiver needs more information, he or she may order lab work or imaging tests (X-rays or MRIs).However, even if imaging tests show changes in your back, this usually does not require surgery. HOME CARE INSTRUCTIONS For many people, back pain returns.Since low back pain is rarely dangerous, it is often a condition that people can learn to manageon their own.   Remain active. It is stressful on the back to sit or stand in one place. Do not sit, drive, or stand in one place for more than 30 minutes at a time. Take short walks on level surfaces as soon as pain allows.Try to increase the length of time you walk each day.  Do not stay in bed.Resting more than 1 or 2 days can delay your recovery.  Do not avoid exercise or work.Your body is made to move.It is not dangerous to be active, even though your back may hurt.Your back will likely heal faster if you return to being active before your pain is gone.  Pay attention to your body when you bend and lift. Many people have less discomfortwhen lifting if they bend their knees, keep the load close to their bodies,and  avoid twisting. Often, the most comfortable positions are those that put less stress on your recovering back.  Find a comfortable position to sleep. Use a firm mattress and lie on your side with your knees slightly bent. If you lie on your back, put a pillow under your knees.  Only take over-the-counter or prescription medicines as directed by your caregiver. Over-the-counter medicines to reduce pain and inflammation are often the most helpful.Your caregiver may prescribe muscle relaxant drugs.These medicines help dull your pain so you can more quickly return to your normal activities and healthy exercise.  Put ice on the injured area.  Put ice in a plastic bag.  Place a towel between your skin and the bag.  Leave the ice on for 15-20 minutes, 03-04 times a day for the first 2 to 3 days. After that, ice and heat may be alternated to reduce pain and spasms.  Ask your caregiver about trying back exercises and gentle massage. This may be of some benefit.  Avoid feeling anxious or stressed.Stress increases muscle tension and can worsen back pain.It is important to recognize when you are anxious or stressed and learn ways to manage it.Exercise is a great option. SEEK MEDICAL CARE IF:  You have pain that is not relieved with rest or medicine.  You have pain that does not improve in 1 week.  You have new symptoms.  You are generally not feeling well. SEEK   IMMEDIATE MEDICAL CARE IF:   You have pain that radiates from your back into your legs.  You develop new bowel or bladder control problems.  You have unusual weakness or numbness in your arms or legs.  You develop nausea or vomiting.  You develop abdominal pain.  You feel faint. Document Released: 01/10/2005 Document Revised: 07/12/2011 Document Reviewed: 05/14/2013 [employer] Patient Information 2015 [employer]. This information is not intended to replace advice given to you by your health care provider. Make sure you  discuss any questions you have with your health care provider.  

## 2014-03-13 ENCOUNTER — Ambulatory Visit (INDEPENDENT_AMBULATORY_CARE_PROVIDER_SITE_OTHER): Payer: Medicare Other | Admitting: Internal Medicine

## 2014-03-13 ENCOUNTER — Encounter: Payer: Self-pay | Admitting: Internal Medicine

## 2014-03-13 VITALS — BP 148/82 | HR 75 | Temp 97.7°F | Wt 243.0 lb

## 2014-03-13 DIAGNOSIS — M1000 Idiopathic gout, unspecified site: Secondary | ICD-10-CM | POA: Diagnosis not present

## 2014-03-13 DIAGNOSIS — M109 Gout, unspecified: Secondary | ICD-10-CM

## 2014-03-13 DIAGNOSIS — M545 Low back pain, unspecified: Secondary | ICD-10-CM | POA: Insufficient documentation

## 2014-03-13 DIAGNOSIS — R972 Elevated prostate specific antigen [PSA]: Secondary | ICD-10-CM

## 2014-03-13 DIAGNOSIS — I10 Essential (primary) hypertension: Secondary | ICD-10-CM | POA: Diagnosis not present

## 2014-03-13 MED ORDER — DIAZEPAM 5 MG PO TABS: 5.0000 mg | ORAL_TABLET | Freq: Two times a day (BID) | ORAL | Status: DC | PRN

## 2014-03-13 NOTE — Assessment & Plan Note (Signed)
No relapse 

## 2014-03-13 NOTE — Assessment & Plan Note (Signed)
Lab Results  Component Value Date   PSA 5.25* 12/11/2013   PSA 7.33* 04/03/2013   PSA 5.07* 05/30/2006

## 2014-03-13 NOTE — Progress Notes (Signed)
Pre visit review using our clinic review tool, if applicable. No additional management support is needed unless otherwise documented below in the visit note. 

## 2014-03-13 NOTE — Assessment & Plan Note (Signed)
2/16 acute w/a spasm in R glute - resolved. S/p ER visit Diazepam prn

## 2014-03-13 NOTE — Progress Notes (Signed)
Subjective:     HPI  F/u ER visit for LBP:  "36M presents with lower back. Began when he got up from the bed this morning, worse with movement. No relief with pain meds. No fevers, N/V. No weakness/numbness, no loss of bowel/bladder function. Doubt cord compression. Large amounts of muscle spasm in upper buttock on the R. Will give valium and dilaudid.  On re-assessment after meds, doing much better. Mobility increased. Stable for discharge. Evelina Bucy, MD 03/07/14 [phone]"       The pain is gone   The patient presents for a follow-up of  chronic hypertension, chronic dyslipidemia, gout controlled with medicines, elevated PSA  BP Readings from Last 3 Encounters:  03/13/14 148/82  03/06/14 143/72  01/29/14 144/98   Wt Readings from Last 3 Encounters:  03/13/14 243 lb (110.224 kg)  01/29/14 242 lb 12.8 oz (110.133 kg)  01/22/14 243 lb (110.224 kg)     Review of Systems  Constitutional: Negative for appetite change, fatigue and unexpected weight change.  HENT: Negative for congestion, nosebleeds, sneezing, sore throat and trouble swallowing.   Eyes: Negative for itching and visual disturbance.  Respiratory: Negative for cough.   Cardiovascular: Negative for chest pain, palpitations and leg swelling.  Gastrointestinal: Negative for nausea, diarrhea, blood in stool and abdominal distention.  Genitourinary: Negative for frequency and hematuria.  Musculoskeletal: Positive for arthralgias. Negative for back pain, joint swelling, gait problem and neck pain.  Skin: Negative for rash.  Neurological: Negative for dizziness, tremors, speech difficulty and weakness.  Psychiatric/Behavioral: Negative for sleep disturbance, dysphoric mood and agitation. The patient is not nervous/anxious.        Objective:   Physical Exam  Constitutional: He is oriented to person, place, and time. He appears well-developed. No distress.  NAD  HENT:  Mouth/Throat: Oropharynx is clear and  moist.  Eyes: Conjunctivae are normal. Pupils are equal, round, and reactive to light.  Neck: Normal range of motion. No JVD present. No thyromegaly present.  Cardiovascular: Normal rate, regular rhythm, normal heart sounds and intact distal pulses.  Exam reveals no gallop and no friction rub.   No murmur heard. Pulmonary/Chest: Effort normal and breath sounds normal. No respiratory distress. He has no wheezes. He has no rales. He exhibits no tenderness.  Abdominal: Soft. Bowel sounds are normal. He exhibits no distension and no mass. There is no tenderness. There is no rebound and no guarding.  Musculoskeletal: Normal range of motion. He exhibits no edema or tenderness.  Lymphadenopathy:    He has no cervical adenopathy.  Neurological: He is alert and oriented to person, place, and time. He has normal reflexes. No cranial nerve deficit. He exhibits normal muscle tone. He displays a negative Romberg sign. Coordination and gait normal.  No meningeal signs  Skin: Skin is warm and dry. No rash noted.  Psychiatric: He has a normal mood and affect. His behavior is normal. Judgment and thought content normal.  trace to no ankle edema B Obese R ear wax - removed w/a loop L ear canal is swollen w/OE  Lab Results  Component Value Date   WBC 5.2 04/03/2013   HGB 13.4 04/03/2013   HCT 39.8 04/03/2013   PLT 186.0 04/03/2013   GLUCOSE 103* 04/03/2013   CHOL 154 02/20/2013   TRIG 121.0 02/20/2013   HDL 48.60 02/20/2013   LDLCALC 81 02/20/2013   ALT 30 04/03/2013   AST 20 04/03/2013   NA 139 04/03/2013   K 4.0 04/03/2013  CL 104 04/03/2013   CREATININE 1.1 04/03/2013   BUN 13 04/03/2013   CO2 31 04/03/2013   TSH 0.93 04/03/2013   PSA 5.25* 12/11/2013   INR 0.9 06/15/2007         Assessment:         Plan:

## 2014-03-13 NOTE — Assessment & Plan Note (Signed)
Continue with current prescription therapy as reflected on the Med list.  

## 2014-03-14 ENCOUNTER — Telehealth: Payer: Self-pay

## 2014-03-14 MED ORDER — FUROSEMIDE 40 MG PO TABS
ORAL_TABLET | ORAL | Status: DC
Start: 2014-03-14 — End: 2014-09-15

## 2014-03-14 NOTE — Telephone Encounter (Signed)
Rx refill request received from [facility] [address]

## 2014-03-18 ENCOUNTER — Other Ambulatory Visit: Payer: Self-pay | Admitting: *Deleted

## 2014-03-18 DIAGNOSIS — N401 Enlarged prostate with lower urinary tract symptoms: Principal | ICD-10-CM

## 2014-03-18 DIAGNOSIS — N138 Other obstructive and reflux uropathy: Secondary | ICD-10-CM

## 2014-03-18 MED ORDER — AMLODIPINE BESYLATE 5 MG PO TABS: 5.0000 mg | ORAL_TABLET | Freq: Every day | ORAL | Status: DC

## 2014-03-19 ENCOUNTER — Other Ambulatory Visit: Payer: Self-pay | Admitting: Urology

## 2014-03-19 ENCOUNTER — Telehealth: Payer: Self-pay | Admitting: Internal Medicine

## 2014-03-19 DIAGNOSIS — N138 Other obstructive and reflux uropathy: Secondary | ICD-10-CM

## 2014-03-19 DIAGNOSIS — N401 Enlarged prostate with lower urinary tract symptoms: Principal | ICD-10-CM

## 2014-03-19 MED ORDER — FINASTERIDE 5 MG PO TABS: 5.0000 mg | ORAL_TABLET | Freq: Every day | ORAL | Status: DC

## 2014-03-19 NOTE — Telephone Encounter (Signed)
Patient is requesting refill of finasteride sent to [facility] [address]

## 2014-03-19 NOTE — Telephone Encounter (Signed)
Rf sent. Left detailed mess informing pt.

## 2014-03-24 DIAGNOSIS — G4733 Obstructive sleep apnea (adult) (pediatric): Secondary | ICD-10-CM | POA: Diagnosis not present

## 2014-03-25 ENCOUNTER — Encounter (HOSPITAL_BASED_OUTPATIENT_CLINIC_OR_DEPARTMENT_OTHER): Payer: Self-pay | Admitting: *Deleted

## 2014-03-26 ENCOUNTER — Encounter (HOSPITAL_BASED_OUTPATIENT_CLINIC_OR_DEPARTMENT_OTHER): Payer: Self-pay | Admitting: *Deleted

## 2014-03-26 NOTE — Progress Notes (Signed)
NPO AFTER MN. ARRIVE AT 0900.  NEEDS ISTAT AND EKG. WILL TAKE CLONIDINE, COZAAR, AND NORVASC AM DOS W/ SIPS OF WATER AND DO FLEET AM DOS.

## 2014-03-28 ENCOUNTER — Ambulatory Visit (HOSPITAL_BASED_OUTPATIENT_CLINIC_OR_DEPARTMENT_OTHER): Payer: Medicare Other | Admitting: Anesthesiology

## 2014-03-28 ENCOUNTER — Other Ambulatory Visit: Payer: Self-pay

## 2014-03-28 ENCOUNTER — Encounter (HOSPITAL_BASED_OUTPATIENT_CLINIC_OR_DEPARTMENT_OTHER)
Admission: RE | Disposition: A | Discharge status: Home / Self Care | Payer: Self-pay | Source: Ambulatory Visit | Attending: Urology

## 2014-03-28 ENCOUNTER — Encounter (HOSPITAL_BASED_OUTPATIENT_CLINIC_OR_DEPARTMENT_OTHER): Payer: Self-pay | Admitting: *Deleted

## 2014-03-28 ENCOUNTER — Ambulatory Visit (HOSPITAL_BASED_OUTPATIENT_CLINIC_OR_DEPARTMENT_OTHER)
Admission: RE | Admit: 2014-03-28 | Discharge: 2014-03-28 | Disposition: A | Discharge status: Home / Self Care | Payer: Medicare Other | Source: Ambulatory Visit | Attending: Urology | Admitting: Urology

## 2014-03-28 DIAGNOSIS — M109 Gout, unspecified: Secondary | ICD-10-CM | POA: Insufficient documentation

## 2014-03-28 DIAGNOSIS — Z792 Long term (current) use of antibiotics: Secondary | ICD-10-CM | POA: Insufficient documentation

## 2014-03-28 DIAGNOSIS — N411 Chronic prostatitis: Secondary | ICD-10-CM | POA: Insufficient documentation

## 2014-03-28 DIAGNOSIS — Z9089 Acquired absence of other organs: Secondary | ICD-10-CM | POA: Diagnosis not present

## 2014-03-28 DIAGNOSIS — N401 Enlarged prostate with lower urinary tract symptoms: Secondary | ICD-10-CM | POA: Insufficient documentation

## 2014-03-28 DIAGNOSIS — R351 Nocturia: Secondary | ICD-10-CM | POA: Diagnosis not present

## 2014-03-28 DIAGNOSIS — Z87891 Personal history of nicotine dependence: Secondary | ICD-10-CM | POA: Insufficient documentation

## 2014-03-28 DIAGNOSIS — C61 Malignant neoplasm of prostate: Secondary | ICD-10-CM | POA: Diagnosis not present

## 2014-03-28 DIAGNOSIS — Z79899 Other long term (current) drug therapy: Secondary | ICD-10-CM | POA: Diagnosis not present

## 2014-03-28 DIAGNOSIS — G4733 Obstructive sleep apnea (adult) (pediatric): Secondary | ICD-10-CM | POA: Diagnosis not present

## 2014-03-28 DIAGNOSIS — R3912 Poor urinary stream: Secondary | ICD-10-CM | POA: Diagnosis not present

## 2014-03-28 DIAGNOSIS — N4289 Other specified disorders of prostate: Secondary | ICD-10-CM | POA: Diagnosis not present

## 2014-03-28 DIAGNOSIS — Z791 Long term (current) use of non-steroidal anti-inflammatories (NSAID): Secondary | ICD-10-CM | POA: Insufficient documentation

## 2014-03-28 DIAGNOSIS — E780 Pure hypercholesterolemia: Secondary | ICD-10-CM | POA: Insufficient documentation

## 2014-03-28 DIAGNOSIS — I10 Essential (primary) hypertension: Secondary | ICD-10-CM | POA: Insufficient documentation

## 2014-03-28 DIAGNOSIS — M1990 Unspecified osteoarthritis, unspecified site: Secondary | ICD-10-CM | POA: Insufficient documentation

## 2014-03-28 DIAGNOSIS — R972 Elevated prostate specific antigen [PSA]: Secondary | ICD-10-CM | POA: Diagnosis not present

## 2014-03-28 DIAGNOSIS — Z9989 Dependence on other enabling machines and devices: Secondary | ICD-10-CM | POA: Diagnosis not present

## 2014-03-28 DIAGNOSIS — Z7982 Long term (current) use of aspirin: Secondary | ICD-10-CM | POA: Diagnosis not present

## 2014-03-28 HISTORY — DX: Dependence on other enabling machines and devices: Z99.89

## 2014-03-28 HISTORY — PX: PROSTATE BIOPSY: SHX241

## 2014-03-28 HISTORY — DX: Obstructive sleep apnea (adult) (pediatric): G47.33

## 2014-03-28 HISTORY — DX: Personal history of other diseases of the musculoskeletal system and connective tissue: Z87.39

## 2014-03-28 HISTORY — DX: Presence of spectacles and contact lenses: Z97.3

## 2014-03-28 HISTORY — DX: Unspecified symptoms and signs involving the genitourinary system: R39.9

## 2014-03-28 HISTORY — DX: Benign prostatic hyperplasia without lower urinary tract symptoms: N40.0

## 2014-03-28 LAB — POCT I-STAT, CHEM 8
BUN: 16 mg/dL (ref 6–23)
Calcium, Ion: 1.18 mmol/L (ref 1.13–1.30)
Chloride: 104 mmol/L (ref 96–112)
Creatinine, Ser: 1 mg/dL (ref 0.50–1.35)
Glucose, Bld: 111 mg/dL — ABNORMAL HIGH (ref 70–99)
HEMATOCRIT: 39 % (ref 39.0–52.0)
HEMOGLOBIN: 13.3 g/dL (ref 13.0–17.0)
POTASSIUM: 3.3 mmol/L — AB (ref 3.5–5.1)
Sodium: 143 mmol/L (ref 135–145)
TCO2: 21 mmol/L (ref 0–100)

## 2014-03-28 SURGERY — BIOPSY, PROSTATE, RECTAL APPROACH, WITH US GUIDANCE
Anesthesia: General | Site: Prostate

## 2014-03-28 MED ORDER — LIDOCAINE HCL 2 % IJ SOLN
INTRAMUSCULAR | Status: DC | PRN
  Administered 2014-03-28: 10 mL

## 2014-03-28 MED ORDER — MIDAZOLAM HCL 2 MG/2ML IJ SOLN
INTRAMUSCULAR | Status: AC
  Filled 2014-03-28: qty 2

## 2014-03-28 MED ORDER — FENTANYL CITRATE 0.05 MG/ML IJ SOLN
INTRAMUSCULAR | Status: DC | PRN
  Administered 2014-03-28: 50 ug via INTRAVENOUS

## 2014-03-28 MED ORDER — LIDOCAINE HCL (CARDIAC) 20 MG/ML IV SOLN
INTRAVENOUS | Status: DC | PRN
  Administered 2014-03-28: 100 mg via INTRAVENOUS

## 2014-03-28 MED ORDER — PROPOFOL INFUSION 10 MG/ML OPTIME
INTRAVENOUS | Status: DC | PRN
  Administered 2014-03-28: 200 mL via INTRAVENOUS

## 2014-03-28 MED ORDER — LACTATED RINGERS IV SOLN
INTRAVENOUS | Status: DC
  Administered 2014-03-28: 10:00:00 via INTRAVENOUS
  Filled 2014-03-28: qty 1000

## 2014-03-28 MED ORDER — FENTANYL CITRATE 0.05 MG/ML IJ SOLN
INTRAMUSCULAR | Status: AC
  Filled 2014-03-28: qty 2

## 2014-03-28 MED ORDER — FENTANYL CITRATE 0.05 MG/ML IJ SOLN
25.0000 ug | INTRAMUSCULAR | Status: DC | PRN
  Filled 2014-03-28: qty 1

## 2014-03-28 MED ORDER — LACTATED RINGERS IV SOLN
INTRAVENOUS | Status: DC
Start: 2014-03-28 — End: 2014-03-28
  Filled 2014-03-28: qty 1000

## 2014-03-28 MED ORDER — MIDAZOLAM HCL 5 MG/5ML IJ SOLN: INTRAMUSCULAR | Status: DC | PRN

## 2014-03-28 SURGICAL SUPPLY — 20 items
BAG DRN ANRFLXCHMBR STRAP LEK (BAG)
BAG URINE LEG 19OZ MD ST LTX (BAG) IMPLANT
CATH FOLEY 2WAY SLVR  5CC 16FR (CATHETERS)
CATH FOLEY 2WAY SLVR 5CC 16FR (CATHETERS) IMPLANT
DRESSING TELFA 8X3 (GAUZE/BANDAGES/DRESSINGS) ×5 IMPLANT
DRSG TEGADERM 4X4.75 (GAUZE/BANDAGES/DRESSINGS) IMPLANT
DRSG TEGADERM 8X12 (GAUZE/BANDAGES/DRESSINGS) IMPLANT
GAUZE SPONGE 4X4 12PLY STRL LF (GAUZE/BANDAGES/DRESSINGS) IMPLANT
GLOVE BIO SURGEON STRL SZ7 (GLOVE) ×3 IMPLANT
NDL SAFETY ECLIPSE 18X1.5 (NEEDLE) IMPLANT
NDL SPNL 22GX7 QUINCKE BK (NEEDLE) IMPLANT
NEEDLE HYPO 18GX1.5 SHARP (NEEDLE) ×3
NEEDLE SPNL 22GX7 QUINCKE BK (NEEDLE) ×3 IMPLANT
PLUG CATH AND CAP STER (CATHETERS) IMPLANT
SET IRRIG Y TYPE TUR BLADDER L (SET/KITS/TRAYS/PACK) IMPLANT
SURGILUBE 2OZ TUBE FLIPTOP (MISCELLANEOUS) ×3 IMPLANT
SYR 20CC LL (SYRINGE) ×3 IMPLANT
SYRINGE 10CC LL (SYRINGE) IMPLANT
TOWEL OR 17X24 6PK STRL BLUE (TOWEL DISPOSABLE) ×3 IMPLANT
UNDERPAD 30X30 INCONTINENT (UNDERPADS AND DIAPERS) ×3 IMPLANT

## 2014-03-28 NOTE — Transfer of Care (Signed)
Immediate Anesthesia Transfer of Care Note  Patient: Jeffrey Carter  Procedure(s) Performed: Procedure(s): BIOPSY TRANSRECTAL ULTRASONIC PROSTATE (TUBP) (N/A)  Patient Location: PACU  Anesthesia Type:General  Level of Consciousness: awake, alert , oriented and patient cooperative  Airway & Oxygen Therapy: Patient Spontanous Breathing and Patient connected to nasal cannula oxygen  Post-op Assessment: Report given to RN and Post -op Vital signs reviewed and stable  Post vital signs: Reviewed and stable  Last Vitals:  Filed Vitals:   03/28/14 0904  BP: 169/88  Pulse: 79  Temp: 36.5 C  Resp: 18    Complications: No apparent anesthesia complications

## 2014-03-28 NOTE — Anesthesia Preprocedure Evaluation (Addendum)
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 on medications Rhythm:regular Rate:Normal     Neuro/Psych negative neurological ROS  negative psych ROS   GI/Hepatic negative GI ROS, Neg liver ROS,   Endo/Other  negative endocrine ROS  Renal/GU negative Renal ROS  negative genitourinary   Musculoskeletal  (+) Arthritis -,   Abdominal   Peds  Hematology negative hematology ROS (+)   Anesthesia Other Findings   Reproductive/Obstetrics negative OB ROS                          Anesthesia Physical Anesthesia Plan  ASA: III  Anesthesia Plan: General   Post-op Pain Management:    Induction: Intravenous  Airway Management Planned: LMA  Additional Equipment:   Intra-op Plan:   Post-operative Plan:   Informed Consent: I have reviewed the patients History and Physical, chart, labs and discussed the procedure including the risks, benefits and alternatives for the proposed anesthesia with the patient or authorized representative who has indicated his/her understanding and acceptance.   Dental Advisory Given  Plan Discussed with: CRNA and Surgeon  Anesthesia Plan Comments:         Anesthesia Quick Evaluation

## 2014-03-28 NOTE — Anesthesia Procedure Notes (Signed)
Procedure Name: LMA Insertion Date/Time: 03/28/2014 10:50 AM Performed by: Wanita Chamberlain Pre-anesthesia Checklist: Patient identified, Timeout performed, Emergency Drugs available, Suction available and Patient being monitored Patient Re-evaluated:Patient Re-evaluated prior to inductionOxygen Delivery Method: Circle system utilized Preoxygenation: Pre-oxygenation with 100% oxygen Intubation Type: IV induction Ventilation: Mask ventilation without difficulty LMA: LMA inserted LMA Size: 5.0 Number of attempts: 1 Placement Confirmation: positive ETCO2 and breath sounds checked- equal and bilateral Tube secured with: Tape Dental Injury: Teeth and Oropharynx as per pre-operative assessment

## 2014-03-28 NOTE — H&P (Signed)
History of Present Illness Jeffrey Carter is referred by Dr Cristie Hem Plotnikov. He was found to have a moderately elevated PSA of 7.33 in March 2015. He is on finasteride and repeat PSA on 11/18 is 5.25. He does not have a family history of prostate cancer. He has urgency, nocturia x 3. Repeat PSA on 1/27 is 10.57.  Past Medical History Problems  1. History of gout (Z87.39) 2. History of hypercholesterolemia (Z86.39) 3. History of hypertension (Z86.79)  Surgical History Problems  1. History of Appendectomy 2. History of Knee Surgery  Current Meds 1. Afrin Allergy SOLN;  Therapy: (Recorded:11Jan2016) to Recorded 2. AmLODIPine Besylate 5 MG Oral Tablet;  Therapy: (Recorded:11Jan2016) to Recorded 3. Aspirin 81 MG Oral Tablet;  Therapy: (Recorded:11Jan2016) to Recorded 4. CloNIDine HCl - 0.2 MG Oral Tablet;  Therapy: (Recorded:11Jan2016) to Recorded 5. Furosemide 40 MG Oral Tablet;  Therapy: (Recorded:11Jan2016) to Recorded 6. Indomethacin 50 MG Oral Capsule;  Therapy: (Recorded:11Jan2016) to Recorded 7. Losartan Potassium 100 MG Oral Tablet;  Therapy: (Recorded:11Jan2016) to Recorded 8. Lovastatin 40 MG Oral Tablet;  Therapy: (Recorded:11Jan2016) to Recorded 9. Neomycin-Polymyxin-HC 3.5-10000-1 Otic Solution;  Therapy: (Recorded:11Jan2016) to Recorded 10. Sildenafil Citrate 20 MG Oral Tablet;   Therapy: (Recorded:11Jan2016) to Recorded 11. Vitamin D TABS;   Therapy: (Recorded:11Jan2016) to Recorded  Allergies Medication  1. No Known Drug Allergies  Family History Problems  1. 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  Genitourinary: feelings of urinary urgency, nocturia, urinary stream starts and stops and erectile dysfunction.  ENT: sinus problems.    Vitals Vital Signs [Data Includes: Last 1 Day]  Recorded: [id number] 09:46AM  Height: 5 ft 9 in Weight: 244 lb  BMI Calculated: 36.03 BSA Calculated: 2.25 Blood Pressure: 144 / 89 Heart Rate: 77 Respiration: 18  Physical Exam Constitutional: Well nourished and well developed . No acute distress.  ENT:. The ears and nose are normal in appearance.  Neck: The appearance of the neck is normal and no neck mass is present.  Pulmonary: No respiratory distress and normal respiratory rhythm and effort.  Cardiovascular: Heart rate and rhythm are normal . No peripheral edema.  Abdomen: The abdomen is obese. The abdomen is soft and nontender. No masses are palpated. No CVA tenderness. No hernias are palpable. No hepatosplenomegaly noted.  Rectal: Rectal exam demonstrates normal sphincter tone, no tenderness and no masses. Estimated prostate size is 2+. The prostate has no nodularity and is not tender. The left seminal vesicle is nonpalpable. The right seminal vesicle is nonpalpable. The perineum is normal on inspection.  Genitourinary: Examination of the penis demonstrates no discharge, no masses, no lesions and a normal meatus. The penis is uncircumcised. The scrotum is without lesions. The right epididymis is palpably normal and non-tender. The left epididymis is palpably normal and non-tender. The right testis is non-tender and without masses. The left testis is non-tender and without masses.  Lymphatics: The femoral and inguinal nodes are not enlarged or tender.  Skin: Normal skin turgor, no visible rash and no visible skin lesions.  Neuro/Psych:. Mood and  affect are appropriate.     Results/Data Urine [Data Includes: Last 1 Day]   [id number] COLOR YELLOW  APPEARANCE CLEAR  SPECIFIC GRAVITY 1.010  pH 6.5  GLUCOSE NEG mg/dL BILIRUBIN NEG  KETONE NEG mg/dL BLOOD NEG  PROTEIN NEG mg/dL UROBILINOGEN 0.2 mg/dL NITRITE NEG  LEUKOCYTE ESTERASE NEG   Assessment Assessed  1. Elevated prostate specific antigen (PSA) (R97.2) 2. 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 Intermittent urinary stream (R39.13) 4. Nocturia (R35.1)  Plan Elevated prostate specific antigen (PSA)  1. PSA REFLEX TO FREE; Status:Hold For - Specimen/Data Collection,Appointment;  Requested YEM:[id number];  2. Follow-up Week x 6 Office  Follow-up  Status: Hold For - Appointment,Date of Service   Requested for: [id number] Health Maintenance  3. UA With REFLEX; [Do Not Release]; Status:Complete;   Done: [id number] 09:11AM  His corrected PSA is 11.50. Repeat PSA on 1/27 is 10.57.  He was scheduled for prostate biopsy under local anesthesia but he could not tolerate the insertion of the ultrasound transducer.  He is here for ultrasound guided prostate biopsy under anesthesia.

## 2014-03-28 NOTE — Op Note (Signed)
Jeffrey Carter is a 73 y.o.   03/28/2014  General  Preop diagnosis: Elevated PSA.  Postop diagnosis: Same  Procedure done: Ultrasound guided prostate biopsy  Surgeon: Charlene Brooke. Tawsha Terrero  Anesthesia: Gen.  Indication: Patient is a 74 years old male who was found to have a moderately elevated PSA of 7.33 in March 2015. He is on finasteride and repeat PSA on 11/18 was 5.25. Corrected PSA is 10.5. Repeat PSA on 02/19/2014 is 10.57. Patient was originally scheduled for biopsy under local anesthesia but he could not tolerate the insertion of the transducer. He is scheduled for prostate biopsy under general anesthesia.  Procedure: Patient was identified by his wrist band and proper timeout was taken.  Under general anesthesia he was prepped and draped and placed in the left lateral decubitus position. The transducer was then inserted in the rectum. A prostatic block was done with 2% lidocaine.  The seminal vesicles appear normal. The prostate measures 5.9 cm in width, 6.32 cm in height, 7.01 cm in length. The prostate volume is 136.98 mL.  Then 2 biopsies of the right base of the prostate were done. One of the biopsies was of the right lateral base and the other one was of the medial base. Then a biopsy of the right lateral mid gland and a biopsy of the right medial mid gland were done. Biopsy of the right lateral apex and right medial apex was also done.  A biopsy of the left lateral base and a biopsy of the left medial base were done. Then a biopsy of the left lateral mid gland and a biopsy of the left medial mid gland were taken. A biopsy of the left lateral apex and a biopsy of the left medial apex were also done.  There was no evidence of bleeding at the end of the procedure. The transducer was then removed.  The patient tolerated the procedure well and left the OR in satisfactory condition to postanesthesia care unit.  EBL:  Minimal  CC: [name], M.D.

## 2014-03-28 NOTE — Anesthesia Postprocedure Evaluation (Signed)
  Anesthesia Post-op Note  Patient: Jeffrey Carter  Procedure(s) Performed: Procedure(s) (LRB): BIOPSY TRANSRECTAL ULTRASONIC PROSTATE (TUBP) (N/A)  Patient Location: PACU  Anesthesia Type: General  Level of Consciousness: awake and alert   Airway and Oxygen Therapy: Patient Spontanous Breathing  Post-op Pain: mild  Post-op Assessment: Post-op Vital signs reviewed, Patient's Cardiovascular Status Stable, Respiratory Function Stable, Patent Airway and No signs of Nausea or vomiting  Last Vitals:  Filed Vitals:   03/28/14 1231  BP: 145/74  Pulse: 65  Temp: 36.4 C  Resp: 16    Post-op Vital Signs: stable   Complications: No apparent anesthesia complications

## 2014-03-28 NOTE — Discharge Instructions (Signed)
Post Anesthesia Home Care Instructions  Activity: Get plenty of rest for the remainder of the day. A responsible adult should stay with you for 24 hours following the procedure.  For the next 24 hours, DO NOT: -Drive a car -Paediatric nurse -Drink alcoholic beverages -Take any medication unless instructed by your physician -Make any legal decisions or sign important papers.  Meals: Start with liquid foods such as gelatin or soup. Progress to regular foods as tolerated. Avoid greasy, spicy, heavy foods. If nausea and/or vomiting occur, drink only clear liquids until the nausea and/or vomiting subsides. Call your physician if vomiting continues.  Special Instructions/Symptoms: Your throat may feel dry or sore from the anesthesia or the breathing tube placed in your throat during surgery. If this causes discomfort, gargle with warm salt water. The discomfort should disappear within 24 hours. Transrectal Ultrasound-Guided Biopsy A transrectal ultrasound-guided biopsy is a procedure to remove samples of tissue from your prostate using ultrasound images to guide the procedure. The procedure is usually done to evaluate the prostate gland of men who have an elevated prostate-specific antigen (PSA). PSA is a blood test to screen for prostate cancer. The biopsy samples are taken to check for prostate cancer.  LET [facility] CARE PROVIDER KNOW ABOUT:  Any allergies you have.  All medicines you are taking, including vitamins, herbs, eye drops, creams, and over-the-counter medicines.  Previous problems you or members of your family have had with the use of anesthetics.  Any blood disorders you have.  Previous surgeries you have had.  Medical conditions you have. RISKS AND COMPLICATIONS Generally, this is a safe procedure. However, as with any procedure, problems can occur. Possible problems include:  Infection of your prostate.  Bleeding from your rectum or blood in your  urine.  Difficulty urinating.  Nerve damage (this is usually temporary).  Damage to surrounding structures such as blood vessels, organs, and muscles, which would require other procedures. BEFORE THE PROCEDURE  Do not eat or drink anything after midnight on the night before the procedure or as directed by your health care provider.  Take medicines only as directed by your health care provider.  Your health care provider may have you stop taking certain medicines 5-7 days before the procedure.  You will be given an enema before the procedure. During an enema, a liquid is injected into your rectum to clear out waste.  You may have lab tests the day of your procedure.   Plan to have someone take you home after the procedure. PROCEDURE   You will be given medicine to help you relax (sedative) before the procedure. An IV tube will be inserted into one of your veins and used to give fluids and medicine.  You will be given antibiotic medicine to reduce the risk of an infection.  You will be placed on your side for the procedure.  A probe with lubricated gel will be placed into your rectum, and images will be taken of your prostate and surrounding structures.  Numbing medicine will be injected into the prostate before the biopsy samples are taken.  A biopsy needle will then be inserted and guided to your prostate with the use of the ultrasound images.  Samples of prostate tissue will be taken, and the needle will then be removed.  The biopsy samples will be sent to a lab to be analyzed. Results are usually back in 2-3 days. AFTER THE PROCEDURE  You will be taken to a recovery area where you will  be monitored.  You may have some discomfort in the rectal area. You will be given pain medicines to control this.  You may be allowed to go home the same day, or you may need to stay in the hospital overnight. Document Released: 05/27/2013 Document Reviewed: 08/29/2012 [facility] Patient  Information 2015 [place]. This information is not intended to replace advice given to you by your health care provider. Make sure you discuss any questions you have with your health care provider.

## 2014-03-31 ENCOUNTER — Encounter (HOSPITAL_BASED_OUTPATIENT_CLINIC_OR_DEPARTMENT_OTHER): Payer: Self-pay | Admitting: Urology

## 2014-04-07 DIAGNOSIS — C61 Malignant neoplasm of prostate: Secondary | ICD-10-CM | POA: Diagnosis not present

## 2014-04-14 ENCOUNTER — Ambulatory Visit (INDEPENDENT_AMBULATORY_CARE_PROVIDER_SITE_OTHER): Payer: Medicare Other | Admitting: Internal Medicine

## 2014-04-14 ENCOUNTER — Encounter: Payer: Self-pay | Admitting: Internal Medicine

## 2014-04-14 VITALS — BP 139/79 | HR 81

## 2014-04-14 DIAGNOSIS — C61 Malignant neoplasm of prostate: Secondary | ICD-10-CM

## 2014-04-14 DIAGNOSIS — I10 Essential (primary) hypertension: Secondary | ICD-10-CM | POA: Diagnosis not present

## 2014-04-14 DIAGNOSIS — R972 Elevated prostate specific antigen [PSA]: Secondary | ICD-10-CM

## 2014-04-14 NOTE — Assessment & Plan Note (Signed)
3/15 Dr Janice Norrie - prostate cancer Pt has an appt w/Dr Valere Dross

## 2014-04-14 NOTE — Assessment & Plan Note (Signed)
Medications: Amlodipine, Catapress, Furosemide, Losartan 

## 2014-04-14 NOTE — Assessment & Plan Note (Addendum)
3/15 Dr Janice Norrie - prostate cancer Pt has an appt w/Dr Valere Dross

## 2014-04-14 NOTE — Progress Notes (Signed)
   Subjective:     HPI     The patient presents for a follow-up of  chronic hypertension, chronic dyslipidemia, gout controlled with medicines, elevated PSA - prostate ca - Dr Janice Norrie. Has an appt w/Dr Valere Dross  BP Readings from Last 3 Encounters:  04/14/14 139/79  03/28/14 145/74  03/13/14 148/82   Wt Readings from Last 3 Encounters:  03/28/14 240 lb (108.863 kg)  03/13/14 243 lb (110.224 kg)  01/29/14 242 lb 12.8 oz (110.133 kg)     Review of Systems  Constitutional: Negative for appetite change, fatigue and unexpected weight change.  HENT: Negative for congestion, nosebleeds, sneezing, sore throat and trouble swallowing.   Eyes: Negative for itching and visual disturbance.  Respiratory: Negative for cough.   Cardiovascular: Negative for chest pain, palpitations and leg swelling.  Gastrointestinal: Negative for nausea, diarrhea, blood in stool and abdominal distention.  Genitourinary: Negative for frequency and hematuria.  Musculoskeletal: Positive for arthralgias. Negative for back pain, joint swelling, gait problem and neck pain.  Skin: Negative for rash.  Neurological: Negative for dizziness, tremors, speech difficulty and weakness.  Psychiatric/Behavioral: Negative for sleep disturbance, dysphoric mood and agitation. The patient is not nervous/anxious.        Objective:   Physical Exam  Constitutional: He is oriented to person, place, and time. He appears well-developed. No distress.  NAD  HENT:  Mouth/Throat: Oropharynx is clear and moist.  Eyes: Conjunctivae are normal. Pupils are equal, round, and reactive to light.  Neck: Normal range of motion. No JVD present. No thyromegaly present.  Cardiovascular: Normal rate, regular rhythm, normal heart sounds and intact distal pulses.  Exam reveals no gallop and no friction rub.   No murmur heard. Pulmonary/Chest: Effort normal and breath sounds normal. No respiratory distress. He has no wheezes. He has no rales. He  exhibits no tenderness.  Abdominal: Soft. Bowel sounds are normal. He exhibits no distension and no mass. There is no tenderness. There is no rebound and no guarding.  Musculoskeletal: Normal range of motion. He exhibits no edema or tenderness.  Lymphadenopathy:    He has no cervical adenopathy.  Neurological: He is alert and oriented to person, place, and time. He has normal reflexes. No cranial nerve deficit. He exhibits normal muscle tone. He displays a negative Romberg sign. Coordination and gait normal.  No meningeal signs  Skin: Skin is warm and dry. No rash noted.  Psychiatric: He has a normal mood and affect. His behavior is normal. Judgment and thought content normal.  trace to no ankle edema B Obese   Lab Results  Component Value Date   WBC 5.2 04/03/2013   HGB 13.3 03/28/2014   HCT 39.0 03/28/2014   PLT 186.0 04/03/2013   GLUCOSE 111* 03/28/2014   CHOL 154 02/20/2013   TRIG 121.0 02/20/2013   HDL 48.60 02/20/2013   LDLCALC 81 02/20/2013   ALT 30 04/03/2013   AST 20 04/03/2013   NA 143 03/28/2014   K 3.3* 03/28/2014   CL 104 03/28/2014   CREATININE 1.00 03/28/2014   BUN 16 03/28/2014   CO2 31 04/03/2013   TSH 0.93 04/03/2013   PSA 5.25* 12/11/2013   INR 0.9 06/15/2007         Assessment:         Plan:

## 2014-04-14 NOTE — Progress Notes (Signed)
Pre visit review using our clinic review tool, if applicable. No additional management support is needed unless otherwise documented below in the visit note. 

## 2014-04-15 ENCOUNTER — Encounter: Payer: Self-pay | Admitting: Radiation Oncology

## 2014-04-15 NOTE — Progress Notes (Signed)
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 Lowella Bandy- Biopsy of Prostate  Past/Anticipated interventions by medical oncology, if any: ???   Weight changes, if any: None  Bowel/Bladder complaints, if any: Urgency and Nocturia  Nausea/Vomiting, if any: None  Pain issues, if any: None SAFETY ISSUES:  Prior radiation? No  Pacemaker/ICD? No  Possible current pregnancy? N/A  Is the patient on methotrexate? No  Current Complaints / other details: Denies ay dysuria. Has urgency and dribbling.   Nocturia x 1 on average

## 2014-04-16 ENCOUNTER — Encounter: Payer: Self-pay | Admitting: Radiation Oncology

## 2014-04-16 ENCOUNTER — Ambulatory Visit
Admission: RE | Admit: 2014-04-16 | Discharge: 2014-04-16 | Disposition: A | Discharge status: Home / Self Care | Payer: Medicare Other | Source: Ambulatory Visit | Attending: Radiation Oncology | Admitting: Radiation Oncology

## 2014-04-16 VITALS — BP 144/93 | HR 79 | Temp 98.3°F | Ht 69.0 in | Wt 241.1 lb

## 2014-04-16 DIAGNOSIS — Z7982 Long term (current) use of aspirin: Secondary | ICD-10-CM | POA: Insufficient documentation

## 2014-04-16 DIAGNOSIS — I10 Essential (primary) hypertension: Secondary | ICD-10-CM | POA: Diagnosis not present

## 2014-04-16 DIAGNOSIS — N401 Enlarged prostate with lower urinary tract symptoms: Secondary | ICD-10-CM | POA: Insufficient documentation

## 2014-04-16 DIAGNOSIS — N529 Male erectile dysfunction, unspecified: Secondary | ICD-10-CM | POA: Diagnosis not present

## 2014-04-16 DIAGNOSIS — Z87891 Personal history of nicotine dependence: Secondary | ICD-10-CM | POA: Insufficient documentation

## 2014-04-16 DIAGNOSIS — E785 Hyperlipidemia, unspecified: Secondary | ICD-10-CM | POA: Insufficient documentation

## 2014-04-16 DIAGNOSIS — C61 Malignant neoplasm of prostate: Secondary | ICD-10-CM | POA: Diagnosis not present

## 2014-04-16 HISTORY — DX: Malignant neoplasm of prostate: C61

## 2014-04-16 NOTE — Addendum Note (Signed)
Encounter addended by: Benn Moulder, RN on: 04/16/2014  2:43 PM<BR>     Documentation filed: Charges VN

## 2014-04-16 NOTE — Progress Notes (Signed)
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 male who is seen today through the courtesy of Dr. 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  He was noted to have a slightly elevated PSA of 7.33 March 2015.  A repeat PSA on 12/11/2013 was 5.25, with a corrected value of 11.5 while being on finasteride.  A follow-up PSA on 02/12/2014 was 10.57, uncorrected.  Therefore, he underwent ultrasound-guided biopsies on 03/28/2014.  He was found to have Gleason 7 (3+4) involving 20% of one core from the left mid lateral gland and 20% of one core from the left mid medial gland.  His gland volume was 137 mL.  He is generally doing well from a GU and GI standpoint and his I PSS score is 8.  He does have erectile dysfunction which improved with Viagra like drugs.  He claims to be sexually active.    PREVIOUS RADIATION THERAPY: No   PAST MEDICAL HISTORY:  has a past medical history of Hypertension; Osteoarthritis; Hyperlipidemia; Elevated PSA; BPH (benign prostatic hyperplasia); Lower urinary tract symptoms (LUTS); History of gout; OSA on CPAP; Wears glasses; Prostate cancer; Urinary urgency; and Urinary frequency.     PAST SURGICAL HISTORY:  Past Surgical History  Procedure Laterality Date  . Total knee arthroplasty Right 06-22-2007  . Colonoscopy  07-06-2006  . Tonsillectomy  as child  . Appendectomy  1966  . Prostate biopsy N/A 03/28/2014    Procedure: BIOPSY TRANSRECTAL ULTRASONIC PROSTATE (TUBP);  Surgeon: Arvil Persons, MD;  Location: [facility];  Service: Urology;  Laterality: N/A;      FAMILY HISTORY: family history includes Cancer in his brother; Diabetes in his father. His mother died at age 51, unknown cause, in his father died at age 66, unknown cause.  No family history of prostate cancer.   SOCIAL HISTORY:  reports that he quit smoking about 52 years ago. His smoking use included Cigarettes. He has a 2 pack-year smoking history. He has never used smokeless tobacco. He reports that he does not drink alcohol or use illicit drugs.  Married, 4 children.  Retired from the [employer] and [employer].   ALLERGIES: Review of patient's allergies indicates no known allergies.   MEDICATIONS:  Current Outpatient Prescriptions  Medication Sig Dispense Refill  . amLODipine (NORVASC) 5 MG tablet Take 1 tablet (5 mg total) by mouth daily. (Patient taking differently: Take 5 mg by mouth every morning. ) 30 tablet 5  . aspirin 81 MG tablet Take 81 mg by mouth daily.      . cloNIDine (CATAPRES) 0.2 MG tablet Take 1 tablet (0.2 mg total) by mouth 2 (two) times daily. 60 tablet 11  . diazepam (VALIUM) 5 MG tablet Take 1 tablet (5 mg total) by mouth every 12 (twelve) hours as needed for anxiety or muscle spasms. 30 tablet 0  . finasteride (PROSCAR) 5 MG tablet Take 1 tablet (5 mg total) by mouth daily. (Patient taking differently: Take 5 mg by mouth every evening. ) 30 tablet 5  . furosemide (LASIX)  40 MG tablet TAKE ONE TABLET BY MOUTH ONCE DAILY (Patient taking differently: Take 40 mg by mouth every morning. TAKE ONE TABLET BY MOUTH ONCE DAILY) 30 tablet 11  . losartan (COZAAR) 100 MG tablet Take 1 tablet (100 mg total) by mouth daily. (Patient taking differently: Take 100 mg by mouth every morning. ) 90 tablet 3  . lovastatin (MEVACOR) 40 MG tablet Take 1 tablet (40 mg total) by mouth daily at 6 PM. 30 tablet 11  . oxymetazoline (AFRIN NASAL SPRAY) 0.05 % nasal spray Place 2 sprays into the nose 2 (two) times daily. (Patient taking differently: Place 2 sprays into the  nose 2 (two) times daily as needed. ) 30 mL 0  . oxyCODONE-acetaminophen (PERCOCET) 5-325 MG per tablet Take 1 tablet by mouth every 8 (eight) hours as needed for moderate pain. (Patient not taking: Reported on 04/16/2014) 10 tablet 0   No current facility-administered medications for this encounter.     REVIEW OF SYSTEMS:  Pertinent items are noted in HPI.    PHYSICAL EXAM:  height is 5\' 9"  (1.753 m) and weight is 241 lb 1.6 oz (109.362 kg). His temperature is 98.3 F (36.8 C). His blood pressure is 144/93 and his pulse is 79.   Head and neck examination: Grossly unremarkable.  Nodes: Without palpable cervical or supraclavicular lymphadenopathy.  Chest: Lungs clear.  Abdomen: Without masses organomegaly.  Genitalia: Unremarkable to inspection.  Rectal: My examination is suboptimal secondary to markedly firm sphincter tone and associated discomfort.  I'm only able to palpate the inferior aspect of the prostate.  I did not feel any induration or nodularity.   LABORATORY DATA:  Lab Results  Component Value Date   WBC 5.2 04/03/2013   HGB 13.3 03/28/2014   HCT 39.0 03/28/2014   MCV 82.4 04/03/2013   PLT 186.0 04/03/2013   Lab Results  Component Value Date   NA 143 03/28/2014   K 3.3* 03/28/2014   CL 104 03/28/2014   CO2 31 04/03/2013   Lab Results  Component Value Date   ALT 30 04/03/2013   AST 20 04/03/2013   ALKPHOS 80 04/03/2013   BILITOT 0.6 04/03/2013   PSA 10.57 (uncorrected) on 02/12/2014   IMPRESSION: Clinical stageT1c intermediate to high risk adenocarcinoma the prostate.  I explained to the patient, his wife, and his daughter that his prognosis is related to his stage, PSA level, and Gleason score.  His stage is favorable, but is uncorrected PSA of over 20 is unfavorable.  His Gleason score of 7 is of intermediate favorability.  For practical purposes, he has intermediate to high-risk disease.  We discussed observation with or without androgen deprivation therapy versus  radiation therapy.  He is not a candidate for seed implantation.  His only radiation therapy option would be 8 weeks of external beam/IMRT.  We discussed the potential acute and late toxicities of radiation therapy.  We also discussed androgen deprivation therapy for 2 reasons.  One would be to downsize his gland to reduce potential rectal toxicity (treat less of the rectum) and the other would be for his intermediate to high-risk disease.  I would offer him 6 months of androgen deprivation therapy which has been shown in single institutions to improve overall survival.  This is currently being looked at in a national study.  We also discussed the side effects from androgen deprivation therapy including hot flashes, loss of sex drive, and fatigue.  We also briefly discussed bladder filling in the treatment planning  process and the need for placement of 3 gold seed markers for image guidance.  He would probably need to go back to the OR for placement of 3 gold seed markers because of his anorectal discomfort with the ultrasound probe.  He will think things over and contact me if he wants to consider radiation therapy.  PLAN: As discussed above.   I spent 60  minutes face to face with the patient and more than 50% of that time was spent in counseling and/or coordination of care.

## 2014-04-24 DIAGNOSIS — G4733 Obstructive sleep apnea (adult) (pediatric): Secondary | ICD-10-CM | POA: Diagnosis not present

## 2014-04-29 DIAGNOSIS — C61 Malignant neoplasm of prostate: Secondary | ICD-10-CM | POA: Diagnosis not present

## 2014-05-05 ENCOUNTER — Telehealth: Payer: Self-pay | Admitting: *Deleted

## 2014-05-05 ENCOUNTER — Encounter: Payer: Self-pay | Admitting: Radiation Oncology

## 2014-05-05 NOTE — Telephone Encounter (Signed)
CALLED PATIENT TO INFORM OF [facility]  APPT. FOR 07-22-14, NO ANSWER, MAILED APPT. CARD

## 2014-05-05 NOTE — Progress Notes (Signed)
CC: Dr. Lowella Bandy, [place] spoke with Mr. Dupuy and his wife this morning after his wife called me to tell me that he was beginning androgen deprivation therapy on 05/20/2014.  6 months of any DVT is probably satisfactory.  The patient did see Dr. Janice Norrie on 04/29/2014 was to wait until 05/20/2014 to begin androgen deprivation therapy.  He will also need placement of 3 gold seed markers for image guidance and I expect that he will require a visit to the OR for placement of gold seeds as he required for his biopsies.  I defer to Dr. Janice Norrie.  I will then see the patient for a follow-up visit in approximately 2 months to get his radiation therapy scheduled.

## 2014-05-20 DIAGNOSIS — Z5111 Encounter for antineoplastic chemotherapy: Secondary | ICD-10-CM | POA: Diagnosis not present

## 2014-05-20 DIAGNOSIS — C61 Malignant neoplasm of prostate: Secondary | ICD-10-CM | POA: Diagnosis not present

## 2014-05-21 ENCOUNTER — Encounter: Payer: Self-pay | Admitting: Internal Medicine

## 2014-05-21 ENCOUNTER — Ambulatory Visit (INDEPENDENT_AMBULATORY_CARE_PROVIDER_SITE_OTHER): Payer: Medicare Other | Admitting: Internal Medicine

## 2014-05-21 VITALS — BP 140/95 | HR 75 | Temp 97.9°F | Ht 69.0 in | Wt 240.0 lb

## 2014-05-21 DIAGNOSIS — H6123 Impacted cerumen, bilateral: Secondary | ICD-10-CM | POA: Diagnosis not present

## 2014-05-21 DIAGNOSIS — M25462 Effusion, left knee: Secondary | ICD-10-CM

## 2014-05-21 DIAGNOSIS — I10 Essential (primary) hypertension: Secondary | ICD-10-CM | POA: Diagnosis not present

## 2014-05-21 DIAGNOSIS — J310 Chronic rhinitis: Secondary | ICD-10-CM | POA: Diagnosis not present

## 2014-05-21 NOTE — Patient Instructions (Addendum)
Plain Mucinex (NOT D) for thick secretions ;force NON dairy fluids .   Nasal cleansing in the shower as discussed with lather of mild shampoo.After 10 seconds wash off lather while  exhaling through nostrils. Make sure that all residual soap is removed to prevent irritation.  Flonase OR Nasacort AQ 1 spray in each nostril twice a day as needed. Use the "crossover" technique into opposite nostril spraying toward opposite ear @ 45 degree angle, not straight up into nostril.  Plain Allegra (NOT D )  160 daily , Loratidine 10 mg , OR Zyrtec 10 mg @ bedtime  as needed for itchy eyes & sneezing. STOP Afrin !  Please do not use Q-tips as we discussed. Should wax build up occur, please put 2-3 drops of mineral oil in the affected  ear at night to soften the wax .Cover the canal with a  cotton ball to prevent the oil from staining bed linens. In the morning fill the ear canal with hydrogen peroxide & lie in the opposite lateral decubitus position(on the side opposite the affected ear)  for 10-15 minutes. After allowing this period of time for the peroxide to dissolve the wax ;shower and use the thinnest washrag available to wick out the wax. If both ears are involved ; alternate this treatment from ear to ear each night until no wax is found on the washrag.   Use an anti-inflammatory cream such as Aspercreme or Zostrix cream twice a day to the affected area as needed. In lieu of this warm moist compresses or  hot water bottle can be used. Do not apply ice .  Minimal Blood Pressure Goal= AVERAGE < 140/90;  Ideal is an AVERAGE < 135/85. This AVERAGE should be calculated from @ least 5-7 BP readings taken @ different times of day on different days of week. You should not respond to isolated BP readings , but rather the AVERAGE for that week .Please bring your  blood pressure cuff to office visits to verify that it is reliable.It  can also be checked against the blood pressure device at the pharmacy. Finger or wrist  cuffs are not dependable; an arm cuff is.

## 2014-05-21 NOTE — Progress Notes (Signed)
   Subjective:    Patient ID: Jeffrey Carter, male    DOB: 1940/10/31, 74 y.o.   MRN: [id number]  HPI  He has had chronic nasal congestion for years for which he's used Afrin nasal spray. Recently this has caused increased burning. The congestion is unrelenting. It is associated with some sneezing. He describes postnasal drainage with clear secretions.  He is also had pain in the left knee recently. He is on indomethacin.  He's been diagnosed with prostatic cancer and started "shots"  Review of Systems He denies itchy, watery eyes. He has no fever, chills, or sweats. He also denies frontal sinus pain, facial pain, nasal purulence. Cough is minor without sputum production. He has no associated shortness of breath or wheezing. He is on CPAP.    Objective:   Physical Exam  Pertinent or positive findings include: Wax is present in both ears. He has large nasal polyps bilaterally. The nasal mucosa is dry but inflamed. He has multiple missing teeth. The oropharynx is markedly crowded. He has an increased second heart sound with slurring. Gynecomastia is present Abdomen is protuberant. Op scar is present over the right knee. He has crepitus with range of motion of the right knee. There is a small amount of effusion on the left knee with ballotibility of the patella.  General appearance :adequately nourished; in no distress. BMI: 35.43 Eyes: No conjunctival inflammation or scleral icterus is present Oral exam:  Lips and gums are healthy appearing. Heart:  Normal rate and regular rhythm. S1 normal without gallop, murmur, click, rub or other extra sounds   Lungs:Chest clear to auscultation; no wheezes, rhonchi,rales ,or rubs present.No increased work of breathing.  Abdomen: bowel sounds normal, soft and non-tender without masses, organomegaly or hernias noted.  No guarding or rebound.  Vascular : all pulses equal ; no bruits present. Skin:Warm & dry.  Intact without suspicious lesions or rashes ;  no tenting Lymphatic: No lymphadenopathy is noted about the head, neck, axilla Neuro: Strength, tone  normal.        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

## 2014-05-21 NOTE — Progress Notes (Signed)
Pre visit review using our clinic review tool, if applicable. No additional management support is needed unless otherwise documented below in the visit note. 

## 2014-05-22 DIAGNOSIS — M1712 Unilateral primary osteoarthritis, left knee: Secondary | ICD-10-CM | POA: Diagnosis not present

## 2014-05-26 DIAGNOSIS — G4733 Obstructive sleep apnea (adult) (pediatric): Secondary | ICD-10-CM | POA: Diagnosis not present

## 2014-05-29 DIAGNOSIS — T8089XA Other complications following infusion, transfusion and therapeutic injection, initial encounter: Secondary | ICD-10-CM | POA: Diagnosis not present

## 2014-07-14 ENCOUNTER — Encounter: Payer: Self-pay | Admitting: Internal Medicine

## 2014-07-14 ENCOUNTER — Ambulatory Visit (INDEPENDENT_AMBULATORY_CARE_PROVIDER_SITE_OTHER): Payer: Medicare Other | Admitting: Internal Medicine

## 2014-07-14 VITALS — BP 140/85 | HR 68 | Wt 238.0 lb

## 2014-07-14 DIAGNOSIS — I10 Essential (primary) hypertension: Secondary | ICD-10-CM | POA: Diagnosis not present

## 2014-07-14 DIAGNOSIS — H6123 Impacted cerumen, bilateral: Secondary | ICD-10-CM

## 2014-07-14 DIAGNOSIS — H6120 Impacted cerumen, unspecified ear: Secondary | ICD-10-CM | POA: Insufficient documentation

## 2014-07-14 DIAGNOSIS — C61 Malignant neoplasm of prostate: Secondary | ICD-10-CM | POA: Diagnosis not present

## 2014-07-14 DIAGNOSIS — R609 Edema, unspecified: Secondary | ICD-10-CM | POA: Diagnosis not present

## 2014-07-14 NOTE — Progress Notes (Signed)
Subjective:     HPI     The patient presents for a follow-up of  chronic hypertension, chronic dyslipidemia, gout controlled with medicines, elevated PSA - prostate ca - Dr Janice Norrie. XRT pending  BP Readings from Last 3 Encounters:  07/14/14 160/98  05/21/14 140/95  04/16/14 144/93   Wt Readings from Last 3 Encounters:  07/14/14 238 lb (107.956 kg)  05/21/14 240 lb (108.863 kg)  04/16/14 241 lb 1.6 oz (109.362 kg)     Review of Systems  Constitutional: Negative for appetite change, fatigue and unexpected weight change.  HENT: Negative for congestion, nosebleeds, sneezing, sore throat and trouble swallowing.   Eyes: Negative for itching and visual disturbance.  Respiratory: Negative for cough.   Cardiovascular: Negative for chest pain, palpitations and leg swelling.  Gastrointestinal: Negative for nausea, diarrhea, blood in stool and abdominal distention.  Genitourinary: Negative for frequency and hematuria.  Musculoskeletal: Positive for arthralgias. Negative for back pain, joint swelling, gait problem and neck pain.  Skin: Negative for rash.  Neurological: Negative for dizziness, tremors, speech difficulty and weakness.  Psychiatric/Behavioral: Negative for sleep disturbance, dysphoric mood and agitation. The patient is not nervous/anxious.        Objective:   Physical Exam  Constitutional: He is oriented to person, place, and time. He appears well-developed. No distress.  NAD  HENT:  Mouth/Throat: Oropharynx is clear and moist.  Eyes: Conjunctivae are normal. Pupils are equal, round, and reactive to light.  Neck: Normal range of motion. No JVD present. No thyromegaly present.  Cardiovascular: Normal rate, regular rhythm, normal heart sounds and intact distal pulses.  Exam reveals no gallop and no friction rub.   No murmur heard. Pulmonary/Chest: Effort normal and breath sounds normal. No respiratory distress. He has no wheezes. He has no rales. He exhibits no  tenderness.  Abdominal: Soft. Bowel sounds are normal. He exhibits no distension and no mass. There is no tenderness. There is no rebound and no guarding.  Musculoskeletal: Normal range of motion. He exhibits no edema or tenderness.  Lymphadenopathy:    He has no cervical adenopathy.  Neurological: He is alert and oriented to person, place, and time. He has normal reflexes. No cranial nerve deficit. He exhibits normal muscle tone. He displays a negative Romberg sign. Coordination and gait normal.  No meningeal signs  Skin: Skin is warm and dry. No rash noted.  Psychiatric: He has a normal mood and affect. His behavior is normal. Judgment and thought content normal.  trace to no ankle edema B Obese Wax B   Lab Results  Component Value Date   WBC 5.2 04/03/2013   HGB 13.3 03/28/2014   HCT 39.0 03/28/2014   PLT 186.0 04/03/2013   GLUCOSE 111* 03/28/2014   CHOL 154 02/20/2013   TRIG 121.0 02/20/2013   HDL 48.60 02/20/2013   LDLCALC 81 02/20/2013   ALT 30 04/03/2013   AST 20 04/03/2013   NA 143 03/28/2014   K 3.3* 03/28/2014   CL 104 03/28/2014   CREATININE 1.00 03/28/2014   BUN 16 03/28/2014   CO2 31 04/03/2013   TSH 0.93 04/03/2013   PSA 5.25* 12/11/2013   INR 0.9 06/15/2007      Procedure Note :     Procedure :  Ear irrigation   Indication:  Cerumen impaction B   Risks, including pain, dizziness, eardrum perforation, bleeding, infection and others as well as benefits were explained to the patient in detail. Verbal consent was obtained and the patient  agreed to proceed.    We used "The Elephant Ear Irrigation Device" filled with lukewarm water for irrigation. A large amount wax was recovered. Procedure has also required manual wax removal with an ear loop.   Tolerated well. Complications: None.   Postprocedure instructions :  Call if problems.      Assessment:         Plan:

## 2014-07-14 NOTE — Assessment & Plan Note (Signed)
Doing well 

## 2014-07-14 NOTE — Assessment & Plan Note (Signed)
3/15 Dr Janice Norrie - prostate cancer Dr Valere Dross - XRT pending

## 2014-07-14 NOTE — Assessment & Plan Note (Signed)
See procedure 

## 2014-07-14 NOTE — Assessment & Plan Note (Signed)
  Medications: Amlodipine, Catapress, Furosemide, Losartan  Feb '06 CT angio: Impression:  1. Negative for renal artery stenosis or other lesion to suggest a renovascular  component of the patient's hypertension.  2. 4 cm anterior right hepatic segment lesion. Considerations include atypical  hemangioma, less likely FNH or hepatoma. Consider dedicated liver MRI for  further characterization.

## 2014-07-14 NOTE — Progress Notes (Signed)
Pre visit review using our clinic review tool, if applicable. No additional management support is needed unless otherwise documented below in the visit note. 

## 2014-07-22 ENCOUNTER — Ambulatory Visit
Admission: RE | Admit: 2014-07-22 | Discharge: 2014-07-22 | Disposition: A | Discharge status: Home / Self Care | Payer: Medicare Other | Source: Ambulatory Visit | Attending: Radiation Oncology | Admitting: Radiation Oncology

## 2014-07-22 ENCOUNTER — Telehealth: Payer: Self-pay | Admitting: *Deleted

## 2014-07-22 ENCOUNTER — Other Ambulatory Visit: Payer: Self-pay | Admitting: Urology

## 2014-07-22 ENCOUNTER — Encounter: Payer: Self-pay | Admitting: Radiation Oncology

## 2014-07-22 VITALS — BP 142/83 | HR 71 | Temp 98.2°F | Ht 69.0 in | Wt 239.5 lb

## 2014-07-22 DIAGNOSIS — Z7982 Long term (current) use of aspirin: Secondary | ICD-10-CM | POA: Insufficient documentation

## 2014-07-22 DIAGNOSIS — C61 Malignant neoplasm of prostate: Secondary | ICD-10-CM | POA: Insufficient documentation

## 2014-07-22 DIAGNOSIS — N529 Male erectile dysfunction, unspecified: Secondary | ICD-10-CM | POA: Insufficient documentation

## 2014-07-22 DIAGNOSIS — N401 Enlarged prostate with lower urinary tract symptoms: Secondary | ICD-10-CM | POA: Diagnosis not present

## 2014-07-22 DIAGNOSIS — Z87891 Personal history of nicotine dependence: Secondary | ICD-10-CM | POA: Diagnosis not present

## 2014-07-22 DIAGNOSIS — E785 Hyperlipidemia, unspecified: Secondary | ICD-10-CM | POA: Diagnosis not present

## 2014-07-22 DIAGNOSIS — I10 Essential (primary) hypertension: Secondary | ICD-10-CM | POA: Insufficient documentation

## 2014-07-22 NOTE — Progress Notes (Signed)
Mr. Vora reports that he had his Anti-androgen Injection on May 20, 2014.  Denies any joint pain, nor muscle aches.  Hot-flashes noted during the day and night.

## 2014-07-22 NOTE — Telephone Encounter (Signed)
CALLED PATIENT TO INFORM OF GOLD SEED PLACEMENT ON 08-12-14 @ 10:45 AM @ [facility] (DR. NESI TO PLACE SEEDS  AND HIS SIM ON 08-18-14 @ 11 AM @ DR. MURRAY'S OFFICE, SPOKE WITH PATIENT AND HE IS AWARE OF THESE APPTS.

## 2014-07-22 NOTE — Progress Notes (Signed)
CC: Dr. Lowella Bandy  Follow-up note:  Diagnosis: Stage TIc intermediate to high risk adenocarcinoma prostate  History: Jeffrey Carter returns today for review and scheduling of his radiation therapy.  I first saw Jeffrey Carter in consultation on 04/16/2014. He was noted to have a slightly elevated PSA of 7.33 March 2015. A repeat PSA on 12/11/2013 was 5.25, with a corrected value of 11.5 while being on finasteride. A follow-up PSA on 02/12/2014 was 10.57, uncorrected. Therefore, he underwent ultrasound-guided biopsies on 03/28/2014. He was found to have Gleason 7 (3+4) involving 20% of one core from the left mid lateral gland and 20% of one core from the left mid medial gland. His gland volume was 137 mL. He is generally doing well from a GU and GI standpoint.  His previous and current I PSS score is 8. He does have erectile dysfunction which improved with Viagra like drugs. Prior to initiation of androgen deprivation therapy he had been sexually active.  He called me to tell me that he want to proceed with external beam/IMRT.  In view of the size of his prostate gland I felt that he would be a candidate for 6 months of androgen deprivation therapy.  This was initiated on 05/20/2014.  I did not yet get him scheduled for placement of 3 gold seed markers.  As discussed previously, he probably needs to go to the OR because of any rectal discomfort with the ultrasound probe.  He does admit to hot flashes, slight fatigue, and loss of sex drive as expected.  Physical examination: Alert and oriented. Filed Vitals:   07/22/14 0918  BP: 142/83  Pulse: 71  Temp:    Rectal examination not performed today.  Impression: Stage TIc intermediate to high risk adenocarcinoma prostate.  We will try to get 3 gold seeds placed by Dr. Janice Norrie as soon as possible.  Once he has gold seed markers placed, we can proceed with CT simulation.  We again discussed the potential acute and late toxicities of radiation therapy, and  also the need for a comfortably full bladder to minimize urinary treatment-related toxicity.  Consent is signed today.  Plan: As above.  30 minutes was spent face-to-face with the patient, primarily counseling patient and coordinating his care.

## 2014-07-29 DIAGNOSIS — G4733 Obstructive sleep apnea (adult) (pediatric): Secondary | ICD-10-CM | POA: Diagnosis not present

## 2014-08-08 ENCOUNTER — Encounter (HOSPITAL_BASED_OUTPATIENT_CLINIC_OR_DEPARTMENT_OTHER): Payer: Self-pay | Admitting: *Deleted

## 2014-08-08 NOTE — Progress Notes (Signed)
NPO AFTER MN.  ARRIVE AT 0915.  NEEDS ISTAT.  CURRENT EKG IN CHART AND EPIC.  WILL TAKE NORVASC, CATAPRES, AND COZAAR AM DOS W/ SIPS OF WATER.

## 2014-08-11 NOTE — H&P (Signed)
History of Present Illness Jeffrey Carter returns today with his wife and daughter for further discussion regarding management of his prostate cancer. He has Gleason 3+4 in 2 cores at the left mid gland. PSA is 10.57. He is on finasteride and corrected PSA is 21.14. They saw Dr Valere Dross in consultation. His prostate measures 136 ml. He is not a candidate for brachytherapy. They are interested in IMRT. He will need 6 months of androgen deprivation. His wife would like him to start toward the end of the month. He will need gold seeds implantation prior to IMRT. He reports that he voids with a good flow.    Past Medical History Problems  1. History of gout (Z87.39) 2. History of hypercholesterolemia (Z86.39) 3. History of hypertension (Z86.79)  Surgical History Problems  1. History of Appendectomy 2. History of Biopsy Of The Prostate Needle 3. History of Knee Surgery  Current Meds 1. Afrin Allergy SOLN;  Therapy: (Recorded:11Jan2016) to Recorded 2. AmLODIPine Besylate 5 MG Oral Tablet;  Therapy: (Recorded:11Jan2016) to Recorded 3. Aspirin 81 MG Oral Tablet;  Therapy: (Recorded:11Jan2016) to Recorded 4. CloNIDine HCl - 0.2 MG Oral Tablet;  Therapy: (Recorded:11Jan2016) to Recorded 5. Furosemide 40 MG Oral Tablet;  Therapy: (Recorded:11Jan2016) to Recorded 6. Indomethacin 50 MG Oral Capsule;  Therapy: (Recorded:11Jan2016) to Recorded 7. Levofloxacin 500 MG Oral Tablet (Levaquin); TAKE 1 TABLET BY MOUTH THE DAY  BEFORE THE PROCEDURE, TAKE 1 TABLET THE DAY OF THE PROCEDURE, AND  TAKE 1 TABLET THE DAY AFTER THE PROCEDURE;  Therapy: 27Jan2016 to (Evaluate:04Mar2016)  Requested for: [id number]; Last  Rx:02Mar2016 Ordered 8. Losartan Potassium 100 MG Oral Tablet;  Therapy: (Recorded:11Jan2016) to Recorded 9. Lovastatin 40 MG Oral Tablet;  Therapy: (Recorded:11Jan2016) to Recorded 10. Neomycin-Polymyxin-HC 3.5-10000-1 Otic Solution;   Therapy: (Recorded:11Jan2016) to Recorded 11. Sildenafil  Citrate 20 MG Oral Tablet;   Therapy: (Recorded:11Jan2016) to Recorded 12. Vitamin D TABS;   Therapy: (Recorded:11Jan2016) to Recorded  Allergies Medication  1. No Known Drug Allergies  Family History Problems  1. No pertinent family history : Mother, Father  Social History Problems  1. Denied: History of Alcohol use 2. Caffeine use (F15.90)   4 3. Father deceased   51 4. History of smoking (Z87.898) 5. Mother deceased   4 6. Number of children   1 daughter 7. Retired 80. 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    Physical Exam Constitutional:1  Well nourished1  and well developed1  . No acute distress1 .  ENT:1 . The ears and nose are normal in appearance1 .  Neck:1  The appearance of the neck is normal1  and no neck mass is present1 .  Pulmonary:1  No respiratory distress1  and normal respiratory rhythm and effort1 .  Cardiovascular:1  Heart rate and rhythm are normal1  . No peripheral edema.1 .  Abdomen: The abdomen is soft and nontender1  No masses are palpated1  No CVA tenderness1 . No hernias are palpable1  No hepatosplenomegaly noted1   Rectal: Rectal exam demonstrates1  normal sphincter tone1 , no tenderness1  and no masses1 . The prostate1  has no nodularity1  and is not tender1 . The left seminal vesicle is1  nonpalpable1 . The right seminal vesicle is1  nonpalpable1 . The perineum is normal on inspection1 .  Genitourinary: Examination of the penis demonstrates1  no discharge1 , no masses1 , no lesions1  and a normal meatus1 . The scrotum is1  without lesions1 . The right epididymis is1  palpably normal1  and non-tender1 . The left epididymis is1  palpably normal1  and  non-tender1 . The right testis is1  non-tender1  and without masses1 . The left testis is1  non-tender1  and without masses1 .  Lymphatics: The  femoral1  and  inguinal1  nodes are not enlarged or tender1 .  Skin:1  Normal skin turgor1 , no visible rash1  and no visible skin lesions1 .  Neuro/Psych:1 . Mood and affect are appropriate1 .     Results/Data Urine [Data Includes: Last 1 Day]   05Apr2016  COLOR YELLOW   APPEARANCE CLEAR   SPECIFIC GRAVITY 1.020   pH 7.0   GLUCOSE NEG mg/dL  BILIRUBIN NEG   KETONE NEG mg/dL  BLOOD NEG   PROTEIN NEG mg/dL  UROBILINOGEN 0.2 mg/dL  NITRITE NEG   LEUKOCYTE ESTERASE NEG    Assessment Assessed  1. Adenocarcinoma of prostate (C61)  Plan Adenocarcinoma of prostate  1. Follow-up [facility]  Follow-up   3 months Lupron.  Status: Hold For - Appointment,Date of Service  Requested for:  [place] Maintenance  2. UA With REFLEX; [Do Not Release]; Status:Complete;   Done: [id number] 03:51PM   He is scheduled for gold seeds implantation in preparation for IMRT.

## 2014-08-12 ENCOUNTER — Encounter (HOSPITAL_BASED_OUTPATIENT_CLINIC_OR_DEPARTMENT_OTHER): Payer: Self-pay

## 2014-08-12 ENCOUNTER — Ambulatory Visit (HOSPITAL_BASED_OUTPATIENT_CLINIC_OR_DEPARTMENT_OTHER): Payer: Medicare Other | Admitting: Anesthesiology

## 2014-08-12 ENCOUNTER — Encounter (HOSPITAL_BASED_OUTPATIENT_CLINIC_OR_DEPARTMENT_OTHER)
Admission: RE | Disposition: A | Discharge status: Home / Self Care | Payer: Self-pay | Source: Ambulatory Visit | Attending: Urology

## 2014-08-12 ENCOUNTER — Ambulatory Visit (HOSPITAL_BASED_OUTPATIENT_CLINIC_OR_DEPARTMENT_OTHER)
Admission: RE | Admit: 2014-08-12 | Discharge: 2014-08-12 | Disposition: A | Discharge status: Home / Self Care | Payer: Medicare Other | Source: Ambulatory Visit | Attending: Urology | Admitting: Urology

## 2014-08-12 ENCOUNTER — Other Ambulatory Visit: Payer: Self-pay | Admitting: Internal Medicine

## 2014-08-12 DIAGNOSIS — E780 Pure hypercholesterolemia: Secondary | ICD-10-CM | POA: Diagnosis not present

## 2014-08-12 DIAGNOSIS — M1990 Unspecified osteoarthritis, unspecified site: Secondary | ICD-10-CM | POA: Diagnosis not present

## 2014-08-12 DIAGNOSIS — Z791 Long term (current) use of non-steroidal anti-inflammatories (NSAID): Secondary | ICD-10-CM | POA: Diagnosis not present

## 2014-08-12 DIAGNOSIS — Z792 Long term (current) use of antibiotics: Secondary | ICD-10-CM | POA: Diagnosis not present

## 2014-08-12 DIAGNOSIS — Z9989 Dependence on other enabling machines and devices: Secondary | ICD-10-CM | POA: Diagnosis not present

## 2014-08-12 DIAGNOSIS — G4730 Sleep apnea, unspecified: Secondary | ICD-10-CM | POA: Diagnosis not present

## 2014-08-12 DIAGNOSIS — Z7982 Long term (current) use of aspirin: Secondary | ICD-10-CM | POA: Diagnosis not present

## 2014-08-12 DIAGNOSIS — Z79899 Other long term (current) drug therapy: Secondary | ICD-10-CM | POA: Insufficient documentation

## 2014-08-12 DIAGNOSIS — I10 Essential (primary) hypertension: Secondary | ICD-10-CM | POA: Insufficient documentation

## 2014-08-12 DIAGNOSIS — Z87891 Personal history of nicotine dependence: Secondary | ICD-10-CM | POA: Diagnosis not present

## 2014-08-12 DIAGNOSIS — C61 Malignant neoplasm of prostate: Secondary | ICD-10-CM | POA: Diagnosis not present

## 2014-08-12 DIAGNOSIS — M109 Gout, unspecified: Secondary | ICD-10-CM | POA: Insufficient documentation

## 2014-08-12 HISTORY — PX: GOLD SEED IMPLANT: SHX6343

## 2014-08-12 HISTORY — DX: Atrioventricular block, first degree: I44.0

## 2014-08-12 LAB — POCT I-STAT 4, (NA,K, GLUC, HGB,HCT)
GLUCOSE: 111 mg/dL — AB (ref 65–99)
HEMATOCRIT: 33 % — AB (ref 39.0–52.0)
Hemoglobin: 11.2 g/dL — ABNORMAL LOW (ref 13.0–17.0)
Potassium: 3.3 mmol/L — ABNORMAL LOW (ref 3.5–5.1)
SODIUM: 141 mmol/L (ref 135–145)

## 2014-08-12 SURGERY — INSERTION, GOLD SEEDS
Anesthesia: Monitor Anesthesia Care | Site: Prostate

## 2014-08-12 MED ORDER — LIDOCAINE HCL (CARDIAC) 20 MG/ML IV SOLN
INTRAVENOUS | Status: DC | PRN
  Administered 2014-08-12: 50 mg via INTRAVENOUS

## 2014-08-12 MED ORDER — ONDANSETRON HCL 4 MG/2ML IJ SOLN
4.0000 mg | Freq: Once | INTRAMUSCULAR | Status: DC | PRN
  Filled 2014-08-12: qty 2

## 2014-08-12 MED ORDER — FENTANYL CITRATE (PF) 100 MCG/2ML IJ SOLN
25.0000 ug | INTRAMUSCULAR | Status: DC | PRN
  Filled 2014-08-12: qty 1

## 2014-08-12 MED ORDER — LACTATED RINGERS IV SOLN
INTRAVENOUS | Status: DC
  Administered 2014-08-12: 10:00:00 via INTRAVENOUS
  Filled 2014-08-12: qty 1000

## 2014-08-12 MED ORDER — CEFTRIAXONE SODIUM 2 G IJ SOLR
INTRAMUSCULAR | Status: AC
  Filled 2014-08-12: qty 2

## 2014-08-12 MED ORDER — PROPOFOL 500 MG/50ML IV EMUL
INTRAVENOUS | Status: DC | PRN
  Administered 2014-08-12: 100 ug/kg/min via INTRAVENOUS

## 2014-08-12 MED ORDER — CEFTRIAXONE SODIUM IN DEXTROSE 40 MG/ML IV SOLN
2.0000 g | INTRAVENOUS | Status: AC
  Administered 2014-08-12: 2 g via INTRAVENOUS
  Filled 2014-08-12: qty 50

## 2014-08-12 MED ORDER — FENTANYL CITRATE (PF) 100 MCG/2ML IJ SOLN
INTRAMUSCULAR | Status: DC | PRN
  Administered 2014-08-12 (×2): 25 ug via INTRAVENOUS

## 2014-08-12 MED ORDER — CEFTRIAXONE SODIUM IN DEXTROSE 20 MG/ML IV SOLN
1.0000 g | INTRAVENOUS | Status: DC
  Filled 2014-08-12: qty 50

## 2014-08-12 MED ORDER — FENTANYL CITRATE (PF) 100 MCG/2ML IJ SOLN
INTRAMUSCULAR | Status: AC
Start: 2014-08-12 — End: 2014-08-12
  Filled 2014-08-12: qty 4

## 2014-08-12 MED ORDER — ONDANSETRON HCL 4 MG/2ML IJ SOLN
INTRAMUSCULAR | Status: DC | PRN
Start: 2014-08-12 — End: 2014-08-12
  Administered 2014-08-12: 4 mg via INTRAVENOUS

## 2014-08-12 SURGICAL SUPPLY — 2 items
MARKER GOLD PRELOAD 1.2X3 (Urological Implant) IMPLANT
SEED GOLD PRELOAD 1.2X3 (Urological Implant) ×9 IMPLANT

## 2014-08-12 NOTE — Op Note (Signed)
KALEI MCKILLOP is a 74 y.o.   08/12/2014  Monitor Anesthesia Care  Pre-op diagnosis: Adenocarcinoma of prostate, stage TIc   Postop diagnosis: Same  Procedure done: Gold seeds implantation  Surgeon: Charlene Brooke. Tamela Elsayed  Anesthesia monitored anesthesia care  Indication: Patient is a 74 years old male with prostate cancer stage TIc. He selected to have IMRT. He is scheduled today for goal seeds implantation in preparation for radiation therapy.  Procedure: Patient was identified by his wrist band and proper timeout was taken.  Under monitor anesthesia care he was prepped and draped and placed in the left lateral decubitus position. The transducer was inserted in the rectum.  A seed was implanted at the base of the prostate on the lateral margin of the base. Another seed was placed at the apex of the prostate. A third seed was placed in the lateral margin of the left mid gland.  The patient tolerated the procedure well. He left the OR in satisfactory condition to postanesthesia care unit.

## 2014-08-12 NOTE — Discharge Instructions (Signed)
°  Post Anesthesia Home Care Instructions ° °Activity: °Get plenty of rest for the remainder of the day. A responsible adult should stay with you for 24 hours following the procedure.  °For the next 24 hours, DO NOT: °-Drive a car °-Operate machinery °-Drink alcoholic beverages °-Take any medication unless instructed by your physician °-Make any legal decisions or sign important papers. ° °Meals: °Start with liquid foods such as gelatin or soup. Progress to regular foods as tolerated. Avoid greasy, spicy, heavy foods. If nausea and/or vomiting occur, drink only clear liquids until the nausea and/or vomiting subsides. Call your physician if vomiting continues. ° °Special Instructions/Symptoms: °Your throat may feel dry or sore from the anesthesia or the breathing tube placed in your throat during surgery. If this causes discomfort, gargle with warm salt water. The discomfort should disappear within 24 hours. ° °If you had a scopolamine patch placed behind your ear for the management of post- operative nausea and/or vomiting: ° °1. The medication in the patch is effective for 72 hours, after which it should be removed.  Wrap patch in a tissue and discard in the trash. Wash hands thoroughly with soap and water. °2. You may remove the patch earlier than 72 hours if you experience unpleasant side effects which may include dry mouth, dizziness or visual disturbances. °3. Avoid touching the patch. Wash your hands with soap and water after contact with the patch. °  °Call your surgeon if you experience:  ° °1.  Fever over 101.0. °2.  Inability to urinate. °3.  Nausea and/or vomiting. °4.  Extreme swelling or bruising at the surgical site. °5.  Continued bleeding from the incision. °6.  Increased pain, redness or drainage from the incision. °7.  Problems related to your pain medication. °8. Any change in color, movement and/or sensation °9. Any problems and/or concerns °

## 2014-08-12 NOTE — Transfer of Care (Signed)
Last Vitals:  Filed Vitals:   08/12/14 1100  BP: 137/74  Pulse: 63  Temp: 36.6 C  Resp: 16    Immediate Anesthesia Transfer of Care Note  Patient: Jeffrey Carter  Procedure(s) Performed: Procedure(s) (LRB): GOLD SEED IMPLANT (N/A)  Patient Location: PACU  Anesthesia Type: MAC  Level of Consciousness: awake, alert  and oriented  Airway & Oxygen Therapy: Patient Spontanous Breathing and Patient connected to nasal cannula oxygen  Post-op Assessment: Report given to PACU RN and Post -op Vital signs reviewed and stable  Post vital signs: Reviewed and stable  Complications: No apparent anesthesia complications

## 2014-08-12 NOTE — Anesthesia Preprocedure Evaluation (Addendum)
Anesthesia Evaluation  Patient identified by MRN, date of birth, ID band Patient awake    Reviewed: Allergy & Precautions, NPO status , Patient's Chart, lab work & pertinent test results  History of Anesthesia Complications Negative for: history of anesthetic complications  Airway Mallampati: II  TM Distance: >3 FB Neck ROM: Full    Dental no notable dental hx. (+) Dental Advisory Given, Poor Dentition, Missing   Pulmonary sleep apnea and Continuous Positive Airway Pressure Ventilation , former smoker,  breath sounds clear to auscultation  Pulmonary exam normal       Cardiovascular hypertension, Pt. on medications Normal cardiovascular exam+ dysrhythmias (1st degree heart block) Rhythm:Regular Rate:Normal     Neuro/Psych negative neurological ROS  negative psych ROS   GI/Hepatic negative GI ROS, Neg liver ROS,   Endo/Other  negative endocrine ROS  Renal/GU negative Renal ROS  negative genitourinary   Musculoskeletal  (+) Arthritis -, Osteoarthritis,    Abdominal   Peds negative pediatric ROS (+)  Hematology negative hematology ROS (+)   Anesthesia Other Findings   Reproductive/Obstetrics negative OB ROS                           Anesthesia Physical Anesthesia Plan  ASA: II  Anesthesia Plan: MAC   Post-op Pain Management:    Induction: Intravenous  Airway Management Planned: Nasal Cannula  Additional Equipment:   Intra-op Plan:   Post-operative Plan:   Informed Consent: I have reviewed the patients History and Physical, chart, labs and discussed the procedure including the risks, benefits and alternatives for the proposed anesthesia with the patient or authorized representative who has indicated his/her understanding and acceptance.   Dental advisory given  Plan Discussed with: CRNA  Anesthesia Plan Comments:         Anesthesia Quick Evaluation

## 2014-08-12 NOTE — Anesthesia Procedure Notes (Signed)
Procedure Name: MAC Date/Time: 08/12/2014 10:20 AM Performed by: Mechele Claude Pre-anesthesia Checklist: Patient identified, Timeout performed, Emergency Drugs available, Suction available and Patient being monitored Patient Re-evaluated:Patient Re-evaluated prior to inductionOxygen Delivery Method: Nasal cannula Intubation Type: IV induction Placement Confirmation: positive ETCO2 and CO2 detector

## 2014-08-12 NOTE — Anesthesia Postprocedure Evaluation (Signed)
  Anesthesia Post-op Note  Patient: Jeffrey Carter  Procedure(s) Performed: Procedure(s) (LRB): GOLD SEED IMPLANT (N/A)  Patient Location: PACU  Anesthesia Type: MAC  Level of Consciousness: awake and alert   Airway and Oxygen Therapy: Patient Spontanous Breathing  Post-op Pain: mild  Post-op Assessment: Post-op Vital signs reviewed, Patient's Cardiovascular Status Stable, Respiratory Function Stable, Patent Airway and No signs of Nausea or vomiting  Last Vitals:  Filed Vitals:   08/12/14 1100  BP: 137/74  Pulse: 63  Temp: 36.6 C  Resp: 16    Post-op Vital Signs: stable   Complications: No apparent anesthesia complications

## 2014-08-13 ENCOUNTER — Other Ambulatory Visit: Payer: Self-pay

## 2014-08-13 ENCOUNTER — Encounter (HOSPITAL_BASED_OUTPATIENT_CLINIC_OR_DEPARTMENT_OTHER): Payer: Self-pay | Admitting: Urology

## 2014-08-13 MED ORDER — CLONIDINE HCL 0.2 MG PO TABS: 0.2000 mg | ORAL_TABLET | Freq: Two times a day (BID) | ORAL | Status: DC

## 2014-08-15 ENCOUNTER — Telehealth: Payer: Self-pay | Admitting: *Deleted

## 2014-08-15 NOTE — Telephone Encounter (Signed)
Called patient to remind of sim appt. For 08/18/14 @ 11 am, spoke with patient and he is aware of this appt.

## 2014-08-18 ENCOUNTER — Ambulatory Visit
Admission: RE | Admit: 2014-08-18 | Discharge: 2014-08-18 | Disposition: A | Discharge status: Home / Self Care | Payer: Medicare Other | Source: Ambulatory Visit | Attending: Radiation Oncology | Admitting: Radiation Oncology

## 2014-08-18 DIAGNOSIS — Z7982 Long term (current) use of aspirin: Secondary | ICD-10-CM | POA: Diagnosis not present

## 2014-08-18 DIAGNOSIS — Z87891 Personal history of nicotine dependence: Secondary | ICD-10-CM | POA: Diagnosis not present

## 2014-08-18 DIAGNOSIS — C61 Malignant neoplasm of prostate: Secondary | ICD-10-CM | POA: Diagnosis not present

## 2014-08-18 DIAGNOSIS — I10 Essential (primary) hypertension: Secondary | ICD-10-CM | POA: Diagnosis not present

## 2014-08-18 DIAGNOSIS — E785 Hyperlipidemia, unspecified: Secondary | ICD-10-CM | POA: Diagnosis not present

## 2014-08-18 DIAGNOSIS — N529 Male erectile dysfunction, unspecified: Secondary | ICD-10-CM | POA: Diagnosis not present

## 2014-08-18 DIAGNOSIS — N401 Enlarged prostate with lower urinary tract symptoms: Secondary | ICD-10-CM | POA: Diagnosis not present

## 2014-08-18 NOTE — Progress Notes (Signed)
Complex simulation/treatment planning note: The patient was taken to the CT simulator.  A Vac lock immobilization device was constructed. A red rubber tube was placed within the rectal vault.  He was then catheterized and contrast instilled into the bladder/urethra.   An isocenter was chosen along the central prostate.  The CT data set was sent to the planning system right contoured his prostate, seminal vesicles, rectum , bladder, and rectosigmoid colon. I'm prescribing 7800 cGy in 40 sessions to his prostate PTV which represents the prostate was 0.8 cm except for 0.5 cm along the rectum.  I prescribing  5600 cGy in 40 sessions to his seminal vesicle PTV which represents the seminal vesicles +0.5 cm. He is now ready for IMRT simulation/treatment planning.

## 2014-08-20 ENCOUNTER — Encounter: Payer: Self-pay | Admitting: Radiation Oncology

## 2014-08-20 DIAGNOSIS — C61 Malignant neoplasm of prostate: Secondary | ICD-10-CM | POA: Diagnosis not present

## 2014-08-20 DIAGNOSIS — Z87891 Personal history of nicotine dependence: Secondary | ICD-10-CM | POA: Diagnosis not present

## 2014-08-20 DIAGNOSIS — Z7982 Long term (current) use of aspirin: Secondary | ICD-10-CM | POA: Diagnosis not present

## 2014-08-20 DIAGNOSIS — I10 Essential (primary) hypertension: Secondary | ICD-10-CM | POA: Diagnosis not present

## 2014-08-20 DIAGNOSIS — N401 Enlarged prostate with lower urinary tract symptoms: Secondary | ICD-10-CM | POA: Diagnosis not present

## 2014-08-20 DIAGNOSIS — N529 Male erectile dysfunction, unspecified: Secondary | ICD-10-CM | POA: Diagnosis not present

## 2014-08-20 DIAGNOSIS — E785 Hyperlipidemia, unspecified: Secondary | ICD-10-CM | POA: Diagnosis not present

## 2014-08-20 NOTE — Progress Notes (Signed)
IMRT simulation/treatment planning note: We completed IMRT treatment planning for Jeffrey Carter in the management of his carcinoma the prostate.  IMRT was chosen to decrease the risk for both acute and late bladder and rectal toxicity compared to 3-D conformal or conventional radiation therapy.  Dose volume histograms were obtained for the prostate and seminal vesicle PTV targets and also avoidance structures including the femoral heads, bladder, and rectum.  We met our departmental guidelines.  He is being treated with 6 MV photons, dual ARC VMAT IMRT.  I prescribing 7800 cGy in 40 sessions to his prostate PTV and 5600 cGy in 40 sessions to his seminal vesicle PTV.

## 2014-08-21 DIAGNOSIS — I10 Essential (primary) hypertension: Secondary | ICD-10-CM | POA: Diagnosis not present

## 2014-08-21 DIAGNOSIS — Z87891 Personal history of nicotine dependence: Secondary | ICD-10-CM | POA: Diagnosis not present

## 2014-08-21 DIAGNOSIS — N529 Male erectile dysfunction, unspecified: Secondary | ICD-10-CM | POA: Diagnosis not present

## 2014-08-21 DIAGNOSIS — Z7982 Long term (current) use of aspirin: Secondary | ICD-10-CM | POA: Diagnosis not present

## 2014-08-21 DIAGNOSIS — N401 Enlarged prostate with lower urinary tract symptoms: Secondary | ICD-10-CM | POA: Diagnosis not present

## 2014-08-21 DIAGNOSIS — E785 Hyperlipidemia, unspecified: Secondary | ICD-10-CM | POA: Diagnosis not present

## 2014-08-21 DIAGNOSIS — C61 Malignant neoplasm of prostate: Secondary | ICD-10-CM | POA: Diagnosis not present

## 2014-08-25 DIAGNOSIS — G4733 Obstructive sleep apnea (adult) (pediatric): Secondary | ICD-10-CM | POA: Diagnosis not present

## 2014-08-26 DIAGNOSIS — Z5111 Encounter for antineoplastic chemotherapy: Secondary | ICD-10-CM | POA: Diagnosis not present

## 2014-08-26 DIAGNOSIS — C61 Malignant neoplasm of prostate: Secondary | ICD-10-CM | POA: Diagnosis not present

## 2014-08-27 ENCOUNTER — Ambulatory Visit
Admission: RE | Admit: 2014-08-27 | Discharge: 2014-08-27 | Disposition: A | Discharge status: Home / Self Care | Payer: Medicare Other | Source: Ambulatory Visit | Attending: Radiation Oncology | Admitting: Radiation Oncology

## 2014-08-27 DIAGNOSIS — N401 Enlarged prostate with lower urinary tract symptoms: Secondary | ICD-10-CM | POA: Diagnosis not present

## 2014-08-27 DIAGNOSIS — N529 Male erectile dysfunction, unspecified: Secondary | ICD-10-CM | POA: Diagnosis not present

## 2014-08-27 DIAGNOSIS — C61 Malignant neoplasm of prostate: Secondary | ICD-10-CM | POA: Diagnosis not present

## 2014-08-27 DIAGNOSIS — Z7982 Long term (current) use of aspirin: Secondary | ICD-10-CM | POA: Diagnosis not present

## 2014-08-27 DIAGNOSIS — Z87891 Personal history of nicotine dependence: Secondary | ICD-10-CM | POA: Diagnosis not present

## 2014-08-27 DIAGNOSIS — E785 Hyperlipidemia, unspecified: Secondary | ICD-10-CM | POA: Diagnosis not present

## 2014-08-27 DIAGNOSIS — I10 Essential (primary) hypertension: Secondary | ICD-10-CM | POA: Diagnosis not present

## 2014-08-27 NOTE — Progress Notes (Signed)
Chart note: The patient began his radiation therapy today in the management of his carcinoma the prostate with dual ARC VMAT IMRT.  2 sets dynamic MLCs were utilized corresponding  to one set of IMRT treatment devices [phone]).

## 2014-08-28 ENCOUNTER — Encounter: Payer: Self-pay | Admitting: Radiation Oncology

## 2014-08-28 ENCOUNTER — Ambulatory Visit
Admission: RE | Admit: 2014-08-28 | Discharge: 2014-08-28 | Disposition: A | Discharge status: Home / Self Care | Payer: Medicare Other | Source: Ambulatory Visit | Attending: Radiation Oncology | Admitting: Radiation Oncology

## 2014-08-28 DIAGNOSIS — E785 Hyperlipidemia, unspecified: Secondary | ICD-10-CM | POA: Diagnosis not present

## 2014-08-28 DIAGNOSIS — Z87891 Personal history of nicotine dependence: Secondary | ICD-10-CM | POA: Diagnosis not present

## 2014-08-28 DIAGNOSIS — G4733 Obstructive sleep apnea (adult) (pediatric): Secondary | ICD-10-CM | POA: Diagnosis not present

## 2014-08-28 DIAGNOSIS — I10 Essential (primary) hypertension: Secondary | ICD-10-CM | POA: Diagnosis not present

## 2014-08-28 DIAGNOSIS — Z7982 Long term (current) use of aspirin: Secondary | ICD-10-CM | POA: Diagnosis not present

## 2014-08-28 DIAGNOSIS — C61 Malignant neoplasm of prostate: Secondary | ICD-10-CM | POA: Diagnosis not present

## 2014-08-28 DIAGNOSIS — N529 Male erectile dysfunction, unspecified: Secondary | ICD-10-CM | POA: Diagnosis not present

## 2014-08-28 DIAGNOSIS — N401 Enlarged prostate with lower urinary tract symptoms: Secondary | ICD-10-CM | POA: Diagnosis not present

## 2014-08-29 ENCOUNTER — Ambulatory Visit
Admission: RE | Admit: 2014-08-29 | Discharge: 2014-08-29 | Disposition: A | Discharge status: Home / Self Care | Payer: Medicare Other | Source: Ambulatory Visit | Attending: Radiation Oncology | Admitting: Radiation Oncology

## 2014-08-29 DIAGNOSIS — N529 Male erectile dysfunction, unspecified: Secondary | ICD-10-CM | POA: Diagnosis not present

## 2014-08-29 DIAGNOSIS — C61 Malignant neoplasm of prostate: Secondary | ICD-10-CM | POA: Diagnosis not present

## 2014-08-29 DIAGNOSIS — E785 Hyperlipidemia, unspecified: Secondary | ICD-10-CM | POA: Diagnosis not present

## 2014-08-29 DIAGNOSIS — I10 Essential (primary) hypertension: Secondary | ICD-10-CM | POA: Diagnosis not present

## 2014-08-29 DIAGNOSIS — Z87891 Personal history of nicotine dependence: Secondary | ICD-10-CM | POA: Diagnosis not present

## 2014-08-29 DIAGNOSIS — Z7982 Long term (current) use of aspirin: Secondary | ICD-10-CM | POA: Diagnosis not present

## 2014-08-29 DIAGNOSIS — N401 Enlarged prostate with lower urinary tract symptoms: Secondary | ICD-10-CM | POA: Diagnosis not present

## 2014-09-01 ENCOUNTER — Ambulatory Visit
Admission: RE | Admit: 2014-09-01 | Discharge: 2014-09-01 | Disposition: A | Discharge status: Home / Self Care | Payer: Medicare Other | Source: Ambulatory Visit | Attending: Radiation Oncology | Admitting: Radiation Oncology

## 2014-09-01 VITALS — HR 73 | Temp 98.1°F | Resp 12 | Wt 240.0 lb

## 2014-09-01 DIAGNOSIS — C61 Malignant neoplasm of prostate: Secondary | ICD-10-CM

## 2014-09-01 DIAGNOSIS — N529 Male erectile dysfunction, unspecified: Secondary | ICD-10-CM | POA: Diagnosis not present

## 2014-09-01 DIAGNOSIS — I10 Essential (primary) hypertension: Secondary | ICD-10-CM | POA: Diagnosis not present

## 2014-09-01 DIAGNOSIS — E785 Hyperlipidemia, unspecified: Secondary | ICD-10-CM | POA: Diagnosis not present

## 2014-09-01 DIAGNOSIS — N401 Enlarged prostate with lower urinary tract symptoms: Secondary | ICD-10-CM | POA: Diagnosis not present

## 2014-09-01 DIAGNOSIS — Z87891 Personal history of nicotine dependence: Secondary | ICD-10-CM | POA: Diagnosis not present

## 2014-09-01 DIAGNOSIS — Z7982 Long term (current) use of aspirin: Secondary | ICD-10-CM | POA: Diagnosis not present

## 2014-09-01 NOTE — Progress Notes (Signed)
   Weekly Management Note:  outpatient    ICD-9-CM ICD-10-CM   1. Prostate cancer 185 C61     Current Dose:  7.8 Gy  Projected Dose: 78 Gy   Narrative:  The patient presents for routine under treatment assessment.  CBCT/MVCT images/Port film x-rays were reviewed.  The chart was checked. Wife report pt has nocturia 4-5 x nightly. But this is similar to pre-RT and not that bothersome to pt  Physical Findings:  weight is 240 lb (108.863 kg). His oral temperature is 98.1 F (36.7 C). His pulse is 73. His respiration is 12 and oxygen saturation is 100%.   Wt Readings from Last 3 Encounters:  09/01/14 240 lb (108.863 kg)  08/12/14 236 lb 8 oz (107.276 kg)  07/22/14 239 lb 8 oz (108.636 kg)   NAD  Impression:  The patient is tolerating radiotherapy.  Plan:  Continue radiotherapy as planned. May discuss Flomax w/ DR Valere Dross next week if urinary sx become bothersome  ________________________________   Eppie Gibson, M.D.

## 2014-09-01 NOTE — Progress Notes (Signed)
PAIN: He is currently in no pain.  URINARY: Pt reports urinary frequency, urgency, hot flashes, incontinence and dribbling. Pt states they urinate 2 - 3 times per night.  BOWEL: Pt reports a soft bowel movement everyday/everyother day. Pulse 73  Temp(Src) 98.1 F (36.7 C) (Oral)  Resp 12  Wt 240 lb (108.863 kg)  SpO2 100% Wt Readings from Last 3 Encounters:  09/01/14 240 lb (108.863 kg)  08/12/14 236 lb 8 oz (107.276 kg)  07/22/14 239 lb 8 oz (108.636 kg)

## 2014-09-02 ENCOUNTER — Ambulatory Visit
Admission: RE | Admit: 2014-09-02 | Discharge: 2014-09-02 | Disposition: A | Discharge status: Home / Self Care | Payer: Medicare Other | Source: Ambulatory Visit | Attending: Radiation Oncology | Admitting: Radiation Oncology

## 2014-09-02 DIAGNOSIS — C61 Malignant neoplasm of prostate: Secondary | ICD-10-CM | POA: Diagnosis not present

## 2014-09-02 DIAGNOSIS — E785 Hyperlipidemia, unspecified: Secondary | ICD-10-CM | POA: Diagnosis not present

## 2014-09-02 DIAGNOSIS — N529 Male erectile dysfunction, unspecified: Secondary | ICD-10-CM | POA: Diagnosis not present

## 2014-09-02 DIAGNOSIS — Z7982 Long term (current) use of aspirin: Secondary | ICD-10-CM | POA: Diagnosis not present

## 2014-09-02 DIAGNOSIS — Z87891 Personal history of nicotine dependence: Secondary | ICD-10-CM | POA: Diagnosis not present

## 2014-09-02 DIAGNOSIS — I10 Essential (primary) hypertension: Secondary | ICD-10-CM | POA: Diagnosis not present

## 2014-09-02 DIAGNOSIS — N401 Enlarged prostate with lower urinary tract symptoms: Secondary | ICD-10-CM | POA: Diagnosis not present

## 2014-09-03 ENCOUNTER — Ambulatory Visit
Admission: RE | Admit: 2014-09-03 | Discharge: 2014-09-03 | Disposition: A | Discharge status: Home / Self Care | Payer: Medicare Other | Source: Ambulatory Visit | Attending: Radiation Oncology | Admitting: Radiation Oncology

## 2014-09-03 DIAGNOSIS — N401 Enlarged prostate with lower urinary tract symptoms: Secondary | ICD-10-CM | POA: Diagnosis not present

## 2014-09-03 DIAGNOSIS — E785 Hyperlipidemia, unspecified: Secondary | ICD-10-CM | POA: Diagnosis not present

## 2014-09-03 DIAGNOSIS — Z87891 Personal history of nicotine dependence: Secondary | ICD-10-CM | POA: Diagnosis not present

## 2014-09-03 DIAGNOSIS — I10 Essential (primary) hypertension: Secondary | ICD-10-CM | POA: Diagnosis not present

## 2014-09-03 DIAGNOSIS — N529 Male erectile dysfunction, unspecified: Secondary | ICD-10-CM | POA: Diagnosis not present

## 2014-09-03 DIAGNOSIS — Z7982 Long term (current) use of aspirin: Secondary | ICD-10-CM | POA: Diagnosis not present

## 2014-09-03 DIAGNOSIS — C61 Malignant neoplasm of prostate: Secondary | ICD-10-CM | POA: Diagnosis not present

## 2014-09-04 ENCOUNTER — Ambulatory Visit
Admission: RE | Admit: 2014-09-04 | Discharge: 2014-09-04 | Disposition: A | Discharge status: Home / Self Care | Payer: Medicare Other | Source: Ambulatory Visit | Attending: Radiation Oncology | Admitting: Radiation Oncology

## 2014-09-04 DIAGNOSIS — I10 Essential (primary) hypertension: Secondary | ICD-10-CM | POA: Diagnosis not present

## 2014-09-04 DIAGNOSIS — E785 Hyperlipidemia, unspecified: Secondary | ICD-10-CM | POA: Diagnosis not present

## 2014-09-04 DIAGNOSIS — N401 Enlarged prostate with lower urinary tract symptoms: Secondary | ICD-10-CM | POA: Diagnosis not present

## 2014-09-04 DIAGNOSIS — Z7982 Long term (current) use of aspirin: Secondary | ICD-10-CM | POA: Diagnosis not present

## 2014-09-04 DIAGNOSIS — N529 Male erectile dysfunction, unspecified: Secondary | ICD-10-CM | POA: Diagnosis not present

## 2014-09-04 DIAGNOSIS — Z87891 Personal history of nicotine dependence: Secondary | ICD-10-CM | POA: Diagnosis not present

## 2014-09-04 DIAGNOSIS — C61 Malignant neoplasm of prostate: Secondary | ICD-10-CM | POA: Diagnosis not present

## 2014-09-05 ENCOUNTER — Ambulatory Visit
Admission: RE | Admit: 2014-09-05 | Discharge: 2014-09-05 | Disposition: A | Discharge status: Home / Self Care | Payer: Medicare Other | Source: Ambulatory Visit | Attending: Radiation Oncology | Admitting: Radiation Oncology

## 2014-09-05 DIAGNOSIS — Z7982 Long term (current) use of aspirin: Secondary | ICD-10-CM | POA: Diagnosis not present

## 2014-09-05 DIAGNOSIS — I10 Essential (primary) hypertension: Secondary | ICD-10-CM | POA: Diagnosis not present

## 2014-09-05 DIAGNOSIS — N529 Male erectile dysfunction, unspecified: Secondary | ICD-10-CM | POA: Diagnosis not present

## 2014-09-05 DIAGNOSIS — C61 Malignant neoplasm of prostate: Secondary | ICD-10-CM | POA: Diagnosis not present

## 2014-09-05 DIAGNOSIS — N401 Enlarged prostate with lower urinary tract symptoms: Secondary | ICD-10-CM | POA: Diagnosis not present

## 2014-09-05 DIAGNOSIS — E785 Hyperlipidemia, unspecified: Secondary | ICD-10-CM | POA: Diagnosis not present

## 2014-09-05 DIAGNOSIS — Z87891 Personal history of nicotine dependence: Secondary | ICD-10-CM | POA: Diagnosis not present

## 2014-09-08 ENCOUNTER — Ambulatory Visit
Admission: RE | Admit: 2014-09-08 | Discharge: 2014-09-08 | Disposition: A | Discharge status: Home / Self Care | Payer: Medicare Other | Source: Ambulatory Visit | Attending: Radiation Oncology | Admitting: Radiation Oncology

## 2014-09-08 ENCOUNTER — Encounter: Payer: Self-pay | Admitting: Radiation Oncology

## 2014-09-08 ENCOUNTER — Ambulatory Visit: Payer: Medicare Other | Attending: Radiation Oncology

## 2014-09-08 VITALS — BP 172/92 | HR 77 | Temp 98.0°F | Resp 16 | Ht 69.0 in | Wt 239.9 lb

## 2014-09-08 DIAGNOSIS — E785 Hyperlipidemia, unspecified: Secondary | ICD-10-CM | POA: Diagnosis not present

## 2014-09-08 DIAGNOSIS — Z7982 Long term (current) use of aspirin: Secondary | ICD-10-CM | POA: Diagnosis not present

## 2014-09-08 DIAGNOSIS — N401 Enlarged prostate with lower urinary tract symptoms: Secondary | ICD-10-CM | POA: Diagnosis not present

## 2014-09-08 DIAGNOSIS — I10 Essential (primary) hypertension: Secondary | ICD-10-CM | POA: Diagnosis not present

## 2014-09-08 DIAGNOSIS — Z87891 Personal history of nicotine dependence: Secondary | ICD-10-CM | POA: Diagnosis not present

## 2014-09-08 DIAGNOSIS — N529 Male erectile dysfunction, unspecified: Secondary | ICD-10-CM | POA: Diagnosis not present

## 2014-09-08 DIAGNOSIS — C61 Malignant neoplasm of prostate: Secondary | ICD-10-CM

## 2014-09-08 NOTE — Progress Notes (Signed)
Pt here for patient teaching.  Pt given Radiation and You booklet. Pt reports they have not watched the Radiation Therapy Education video on .  Reviewed areas of pertinence such as diarrhea, fatigue, skin changes and urinary and bladder changes . Pt able to give teach back of to pat skin, use unscented/gentle soap and have Imodium on hand,avoid applying anything to skin within 4 hours of treatment. Pt demonstrated understanding and verbalizes understanding of information given and will contact nursing with any questions or concerns.    Pt states they have not watched the Radiation Therapy Education.  On September 08, 2014 video presented and watched. Http://rtanswers.org/treatmentinformation/whattoexpect/index

## 2014-09-08 NOTE — Progress Notes (Addendum)
Jeffrey Carter has completed 9 fractions to his prostate.  He denies having any pain.  He reports an increase in urinary frequency.  He reports getting up 5 times last night.  He denies hematuria.  He reports having a little bit of burning with urination that started Saturday morning.  He reports having pain in his right shoulder on Friday and took a muscle relaxer that his wife gave him and it went away.  His bp was elevated today at 164/90 and 172/92.  He is taking blood pressure medication.  He denies fatigue.  He denies having diarrhea.  He also reports his left leg has "been giving out on him" in his hip area.  He said that it started in the last 2 weeks.  BP 172/92 mmHg  Pulse 77  Temp(Src) 98 F (36.7 C) (Oral)  Resp 16  Ht 5\' 9"  (1.753 m)  Wt 239 lb 14.4 oz (108.818 kg)  BMI 35.41 kg/m2

## 2014-09-08 NOTE — Progress Notes (Signed)
Weekly Management Note:  Site: Prostate Current Dose:  1755  cGy Projected Dose: 7800  cGy  Narrative: The patient is seen today for routine under treatment assessment. CBCT/MVCT images/port films were reviewed. The chart was reviewed.   Bladder filling is satisfactory.  No GU or GI difficulties.  He does have intermittent right shoulder discomfort for which he took a muscle relaxer this past Friday.  I doubt that he has metastatic disease.  Physical Examination:  Filed Vitals:   09/08/14 1130  BP: 172/92  Pulse: 77  Temp:   Resp:   .  Weight: 239 lb 14.4 oz (108.818 kg).  Rectal examination not performed today.  Impression: Tolerating radiation therapy well.  He will see his primary care physician regarding his shoulder discomfort.  Plan: Continue radiation therapy as planned.

## 2014-09-09 ENCOUNTER — Ambulatory Visit
Admission: RE | Admit: 2014-09-09 | Discharge: 2014-09-09 | Disposition: A | Discharge status: Home / Self Care | Payer: Medicare Other | Source: Ambulatory Visit | Attending: Radiation Oncology | Admitting: Radiation Oncology

## 2014-09-09 ENCOUNTER — Other Ambulatory Visit: Payer: Self-pay | Admitting: Internal Medicine

## 2014-09-09 DIAGNOSIS — N529 Male erectile dysfunction, unspecified: Secondary | ICD-10-CM | POA: Diagnosis not present

## 2014-09-09 DIAGNOSIS — I10 Essential (primary) hypertension: Secondary | ICD-10-CM | POA: Diagnosis not present

## 2014-09-09 DIAGNOSIS — Z7982 Long term (current) use of aspirin: Secondary | ICD-10-CM | POA: Diagnosis not present

## 2014-09-09 DIAGNOSIS — Z87891 Personal history of nicotine dependence: Secondary | ICD-10-CM | POA: Diagnosis not present

## 2014-09-09 DIAGNOSIS — E785 Hyperlipidemia, unspecified: Secondary | ICD-10-CM | POA: Diagnosis not present

## 2014-09-09 DIAGNOSIS — N401 Enlarged prostate with lower urinary tract symptoms: Secondary | ICD-10-CM | POA: Diagnosis not present

## 2014-09-09 DIAGNOSIS — C61 Malignant neoplasm of prostate: Secondary | ICD-10-CM | POA: Diagnosis not present

## 2014-09-10 ENCOUNTER — Ambulatory Visit
Admission: RE | Admit: 2014-09-10 | Discharge: 2014-09-10 | Disposition: A | Discharge status: Home / Self Care | Payer: Medicare Other | Source: Ambulatory Visit | Attending: Radiation Oncology | Admitting: Radiation Oncology

## 2014-09-10 DIAGNOSIS — I10 Essential (primary) hypertension: Secondary | ICD-10-CM | POA: Diagnosis not present

## 2014-09-10 DIAGNOSIS — C61 Malignant neoplasm of prostate: Secondary | ICD-10-CM | POA: Diagnosis not present

## 2014-09-10 DIAGNOSIS — N401 Enlarged prostate with lower urinary tract symptoms: Secondary | ICD-10-CM | POA: Diagnosis not present

## 2014-09-10 DIAGNOSIS — Z7982 Long term (current) use of aspirin: Secondary | ICD-10-CM | POA: Diagnosis not present

## 2014-09-10 DIAGNOSIS — Z87891 Personal history of nicotine dependence: Secondary | ICD-10-CM | POA: Diagnosis not present

## 2014-09-10 DIAGNOSIS — N529 Male erectile dysfunction, unspecified: Secondary | ICD-10-CM | POA: Diagnosis not present

## 2014-09-10 DIAGNOSIS — E785 Hyperlipidemia, unspecified: Secondary | ICD-10-CM | POA: Diagnosis not present

## 2014-09-11 ENCOUNTER — Ambulatory Visit
Admission: RE | Admit: 2014-09-11 | Discharge: 2014-09-11 | Disposition: A | Discharge status: Home / Self Care | Payer: Medicare Other | Source: Ambulatory Visit | Attending: Radiation Oncology | Admitting: Radiation Oncology

## 2014-09-11 DIAGNOSIS — C61 Malignant neoplasm of prostate: Secondary | ICD-10-CM | POA: Diagnosis not present

## 2014-09-11 DIAGNOSIS — N401 Enlarged prostate with lower urinary tract symptoms: Secondary | ICD-10-CM | POA: Diagnosis not present

## 2014-09-11 DIAGNOSIS — N529 Male erectile dysfunction, unspecified: Secondary | ICD-10-CM | POA: Diagnosis not present

## 2014-09-11 DIAGNOSIS — Z7982 Long term (current) use of aspirin: Secondary | ICD-10-CM | POA: Diagnosis not present

## 2014-09-11 DIAGNOSIS — Z87891 Personal history of nicotine dependence: Secondary | ICD-10-CM | POA: Diagnosis not present

## 2014-09-11 DIAGNOSIS — I10 Essential (primary) hypertension: Secondary | ICD-10-CM | POA: Diagnosis not present

## 2014-09-11 DIAGNOSIS — E785 Hyperlipidemia, unspecified: Secondary | ICD-10-CM | POA: Diagnosis not present

## 2014-09-12 ENCOUNTER — Other Ambulatory Visit: Payer: Medicare Other

## 2014-09-12 ENCOUNTER — Other Ambulatory Visit: Payer: Self-pay | Admitting: Internal Medicine

## 2014-09-12 ENCOUNTER — Ambulatory Visit
Admission: RE | Admit: 2014-09-12 | Discharge: 2014-09-12 | Disposition: A | Discharge status: Home / Self Care | Payer: Medicare Other | Source: Ambulatory Visit | Attending: Radiation Oncology | Admitting: Radiation Oncology

## 2014-09-12 DIAGNOSIS — R300 Dysuria: Secondary | ICD-10-CM

## 2014-09-12 DIAGNOSIS — R358 Other polyuria: Secondary | ICD-10-CM

## 2014-09-12 DIAGNOSIS — E785 Hyperlipidemia, unspecified: Secondary | ICD-10-CM | POA: Diagnosis not present

## 2014-09-12 DIAGNOSIS — R3589 Other polyuria: Secondary | ICD-10-CM

## 2014-09-12 DIAGNOSIS — Z87891 Personal history of nicotine dependence: Secondary | ICD-10-CM | POA: Diagnosis not present

## 2014-09-12 DIAGNOSIS — I10 Essential (primary) hypertension: Secondary | ICD-10-CM | POA: Diagnosis not present

## 2014-09-12 DIAGNOSIS — C61 Malignant neoplasm of prostate: Secondary | ICD-10-CM | POA: Diagnosis not present

## 2014-09-12 DIAGNOSIS — N529 Male erectile dysfunction, unspecified: Secondary | ICD-10-CM | POA: Diagnosis not present

## 2014-09-12 DIAGNOSIS — N401 Enlarged prostate with lower urinary tract symptoms: Secondary | ICD-10-CM | POA: Diagnosis not present

## 2014-09-12 DIAGNOSIS — Z7982 Long term (current) use of aspirin: Secondary | ICD-10-CM | POA: Diagnosis not present

## 2014-09-13 LAB — URINALYSIS W MICROSCOPIC + REFLEX CULTURE
Bacteria, UA: NONE SEEN [HPF]
Bilirubin Urine: NEGATIVE
Casts: NONE SEEN [LPF]
Crystals: NONE SEEN [HPF]
Glucose, UA: NEGATIVE
Hgb urine dipstick: NEGATIVE
Ketones, ur: NEGATIVE
LEUKOCYTES UA: NEGATIVE
NITRITE: NEGATIVE
Protein, ur: NEGATIVE
RBC / HPF: NONE SEEN RBC/HPF (ref <=2)
SPECIFIC GRAVITY, URINE: 1.007 (ref 1.001–1.035)
Squamous Epithelial / LPF: NONE SEEN [HPF] (ref <=5)
WBC UA: NONE SEEN WBC/HPF (ref <=5)
YEAST: NONE SEEN [HPF]
pH: 6.5 (ref 5.0–8.0)

## 2014-09-15 ENCOUNTER — Ambulatory Visit
Admission: RE | Admit: 2014-09-15 | Discharge: 2014-09-15 | Disposition: A | Discharge status: Home / Self Care | Payer: Medicare Other | Source: Ambulatory Visit | Attending: Radiation Oncology | Admitting: Radiation Oncology

## 2014-09-15 ENCOUNTER — Ambulatory Visit (INDEPENDENT_AMBULATORY_CARE_PROVIDER_SITE_OTHER): Payer: Medicare Other | Admitting: Internal Medicine

## 2014-09-15 ENCOUNTER — Encounter: Payer: Self-pay | Admitting: Internal Medicine

## 2014-09-15 VITALS — BP 160/80 | HR 63 | Temp 97.6°F | Wt 236.3 lb

## 2014-09-15 VITALS — BP 140/80 | HR 70 | Temp 97.7°F | Wt 234.0 lb

## 2014-09-15 DIAGNOSIS — N320 Bladder-neck obstruction: Secondary | ICD-10-CM

## 2014-09-15 DIAGNOSIS — R609 Edema, unspecified: Secondary | ICD-10-CM

## 2014-09-15 DIAGNOSIS — R350 Frequency of micturition: Secondary | ICD-10-CM | POA: Diagnosis not present

## 2014-09-15 DIAGNOSIS — Z87891 Personal history of nicotine dependence: Secondary | ICD-10-CM | POA: Diagnosis not present

## 2014-09-15 DIAGNOSIS — N401 Enlarged prostate with lower urinary tract symptoms: Secondary | ICD-10-CM | POA: Diagnosis not present

## 2014-09-15 DIAGNOSIS — N529 Male erectile dysfunction, unspecified: Secondary | ICD-10-CM | POA: Diagnosis not present

## 2014-09-15 DIAGNOSIS — C61 Malignant neoplasm of prostate: Secondary | ICD-10-CM

## 2014-09-15 DIAGNOSIS — E785 Hyperlipidemia, unspecified: Secondary | ICD-10-CM | POA: Diagnosis not present

## 2014-09-15 DIAGNOSIS — Z7982 Long term (current) use of aspirin: Secondary | ICD-10-CM | POA: Diagnosis not present

## 2014-09-15 DIAGNOSIS — I10 Essential (primary) hypertension: Secondary | ICD-10-CM | POA: Diagnosis not present

## 2014-09-15 MED ORDER — TAMSULOSIN HCL 0.4 MG PO CAPS: 0.4000 mg | ORAL_CAPSULE | Freq: Every day | ORAL | Status: DC

## 2014-09-15 MED ORDER — FUROSEMIDE 40 MG PO TABS: ORAL_TABLET | ORAL | Status: DC

## 2014-09-15 MED ORDER — OXYBUTYNIN CHLORIDE 5 MG PO TABS: 5.0000 mg | ORAL_TABLET | Freq: Three times a day (TID) | ORAL | Status: DC | PRN

## 2014-09-15 NOTE — Progress Notes (Signed)
Pre visit review using our clinic review tool, if applicable. No additional management support is needed unless otherwise documented below in the visit note. 

## 2014-09-15 NOTE — Progress Notes (Signed)
CC: Dr. Cristie Hem Plotnikov  Weekly Management Note:  Site: Prostate Current Dose:  2730  cGy Projected Dose: 7800  cGy  Narrative: The patient is seen today for routine under treatment assessment. CBCT/MVCT images/port films were reviewed. The chart was reviewed.   Bladder filling is satisfactory.  He is having more frequency with nocturia 5.  His preradiation operative symptom score was 8.  Dr. Alain Marion wrote him prescriptions for oxybutynin and also Lasix.  In his note, he told him to hold his furosemide, but I and the patient are  unclear as to why he wrote a new prescription.  No GI difficulties.  Physical Examination:  Filed Vitals:   09/15/14 1123  BP: 160/80  Pulse: 63  Temp: 97.6 F (36.4 C)  .  Weight: 236 lb 4.8 oz (107.185 kg).  No change.  Impression: Tolerating radiation therapy well except for what I believe to be obstructive symptoms rather than bladder spasms/irritative symptoms.  He does have good bladder filling which would support obstructive symptoms rather than irritative symptoms.  I will start him on tamsulosin, and I told him to hold off on Ditropan for now.  I will have Dr. Alain Marion clarify whether not he wants him to take furosemide.  Plan: Continue radiation therapy as planned.

## 2014-09-15 NOTE — Progress Notes (Signed)
   Subjective:  Patient ID: Jeffrey Carter, male    DOB: September 23, 1940  Age: 74 y.o. MRN: [id number]  CC: Urinary Frequency   HPI Jeffrey Carter presents for urinary urgency and nocturia - pain at times. Pt has started XRT for prostate Ca on 08/27/14  Outpatient Prescriptions Prior to Visit  Medication Sig Dispense Refill  . amLODipine (NORVASC) 5 MG tablet Take 1 tablet (5 mg total) by mouth daily. (Patient taking differently: Take 5 mg by mouth every morning. ) 30 tablet 5  . aspirin 81 MG tablet Take 81 mg by mouth daily.      . cloNIDine (CATAPRES) 0.2 MG tablet Take 1 tablet (0.2 mg total) by mouth 2 (two) times daily. 60 tablet 3  . finasteride (PROSCAR) 5 MG tablet TAKE ONE TABLET BY MOUTH ONCE DAILY 30 tablet 11  . furosemide (LASIX) 40 MG tablet TAKE ONE TABLET BY MOUTH ONCE DAILY (Patient taking differently: Take 40 mg by mouth every morning. TAKE ONE TABLET BY MOUTH ONCE DAILY) 30 tablet 11  . Leuprolide Acetate (LUPRON IJ) Inject as directed.    Marland Kitchen losartan (COZAAR) 100 MG tablet Take 1 tablet (100 mg total) by mouth daily. (Patient taking differently: Take 100 mg by mouth every morning. ) 90 tablet 3  . lovastatin (MEVACOR) 40 MG tablet Take 1 tablet (40 mg total) by mouth daily at 6 PM. 30 tablet 11  . oxymetazoline (AFRIN NASAL SPRAY) 0.05 % nasal spray Place 2 sprays into the nose 2 (two) times daily. (Patient taking differently: Place 2 sprays into the nose 2 (two) times daily as needed. ) 30 mL 0   No facility-administered medications prior to visit.    ROS Review of Systems  Objective:  BP 140/80 mmHg  Pulse 70  Temp(Src) 97.7 F (36.5 C) (Oral)  Wt 234 lb (106.142 kg)  SpO2 97%  BP Readings from Last 3 Encounters:  09/15/14 140/80  09/08/14 172/92  08/12/14 149/59    Wt Readings from Last 3 Encounters:  09/15/14 234 lb (106.142 kg)  09/08/14 239 lb 14.4 oz (108.818 kg)  09/01/14 240 lb (108.863 kg)    Physical Exam  Lab Results  Component Value Date     WBC 5.2 04/03/2013   HGB 11.2* 08/12/2014   HCT 33.0* 08/12/2014   PLT 186.0 04/03/2013   GLUCOSE 111* 08/12/2014   CHOL 154 02/20/2013   TRIG 121.0 02/20/2013   HDL 48.60 02/20/2013   LDLCALC 81 02/20/2013   ALT 30 04/03/2013   AST 20 04/03/2013   NA 141 08/12/2014   K 3.3* 08/12/2014   CL 104 03/28/2014   CREATININE 1.00 03/28/2014   BUN 16 03/28/2014   CO2 31 04/03/2013   TSH 0.93 04/03/2013   PSA 5.25* 12/11/2013   INR 0.9 06/15/2007    No results found.  Assessment & Plan:   There are no diagnoses linked to this encounter. I am having Mr. Teare maintain his aspirin, oxymetazoline, lovastatin, losartan, furosemide, amLODipine, Leuprolide Acetate (LUPRON IJ), cloNIDine, and finasteride.  No orders of the defined types were placed in this encounter.     Follow-up: No Follow-up on file.  [name], MD

## 2014-09-15 NOTE — Assessment & Plan Note (Signed)
Hold lasix for now

## 2014-09-15 NOTE — Assessment & Plan Note (Signed)
8/16 worse. Bladder neck obstruction/prostate Ca/XRT effect Given Ditropan. Hold Lasix. Pt is using a pad UA ok

## 2014-09-15 NOTE — Assessment & Plan Note (Signed)
8/16 worse. Bladder neck obstruction/prostate Ca/XRT effect Given Ditropan. Hold Lasix. Pt is using a pad

## 2014-09-15 NOTE — Progress Notes (Signed)
Weekly assessment of radiation to prostate.Completed 14 of 40 treatments.Denies pain.Has frequency and urgency of urination.Nocturia x 5.No bowel problems.Presribed new medications of furosemide and oxybutnin.

## 2014-09-16 ENCOUNTER — Ambulatory Visit
Admission: RE | Admit: 2014-09-16 | Discharge: 2014-09-16 | Disposition: A | Discharge status: Home / Self Care | Payer: Medicare Other | Source: Ambulatory Visit | Attending: Radiation Oncology | Admitting: Radiation Oncology

## 2014-09-16 DIAGNOSIS — I10 Essential (primary) hypertension: Secondary | ICD-10-CM | POA: Diagnosis not present

## 2014-09-16 DIAGNOSIS — C61 Malignant neoplasm of prostate: Secondary | ICD-10-CM | POA: Diagnosis not present

## 2014-09-16 DIAGNOSIS — Z87891 Personal history of nicotine dependence: Secondary | ICD-10-CM | POA: Diagnosis not present

## 2014-09-16 DIAGNOSIS — N401 Enlarged prostate with lower urinary tract symptoms: Secondary | ICD-10-CM | POA: Diagnosis not present

## 2014-09-16 DIAGNOSIS — N529 Male erectile dysfunction, unspecified: Secondary | ICD-10-CM | POA: Diagnosis not present

## 2014-09-16 DIAGNOSIS — Z7982 Long term (current) use of aspirin: Secondary | ICD-10-CM | POA: Diagnosis not present

## 2014-09-16 DIAGNOSIS — E785 Hyperlipidemia, unspecified: Secondary | ICD-10-CM | POA: Diagnosis not present

## 2014-09-17 ENCOUNTER — Ambulatory Visit
Admission: RE | Admit: 2014-09-17 | Discharge: 2014-09-17 | Disposition: A | Discharge status: Home / Self Care | Payer: Medicare Other | Source: Ambulatory Visit | Attending: Radiation Oncology | Admitting: Radiation Oncology

## 2014-09-17 DIAGNOSIS — Z7982 Long term (current) use of aspirin: Secondary | ICD-10-CM | POA: Diagnosis not present

## 2014-09-17 DIAGNOSIS — Z87891 Personal history of nicotine dependence: Secondary | ICD-10-CM | POA: Diagnosis not present

## 2014-09-17 DIAGNOSIS — N529 Male erectile dysfunction, unspecified: Secondary | ICD-10-CM | POA: Diagnosis not present

## 2014-09-17 DIAGNOSIS — C61 Malignant neoplasm of prostate: Secondary | ICD-10-CM | POA: Diagnosis not present

## 2014-09-17 DIAGNOSIS — N401 Enlarged prostate with lower urinary tract symptoms: Secondary | ICD-10-CM | POA: Diagnosis not present

## 2014-09-17 DIAGNOSIS — E785 Hyperlipidemia, unspecified: Secondary | ICD-10-CM | POA: Diagnosis not present

## 2014-09-17 DIAGNOSIS — I10 Essential (primary) hypertension: Secondary | ICD-10-CM | POA: Diagnosis not present

## 2014-09-18 ENCOUNTER — Other Ambulatory Visit: Payer: Self-pay | Admitting: Internal Medicine

## 2014-09-18 ENCOUNTER — Ambulatory Visit
Admission: RE | Admit: 2014-09-18 | Discharge: 2014-09-18 | Disposition: A | Discharge status: Home / Self Care | Payer: Medicare Other | Source: Ambulatory Visit | Attending: Radiation Oncology | Admitting: Radiation Oncology

## 2014-09-18 DIAGNOSIS — N529 Male erectile dysfunction, unspecified: Secondary | ICD-10-CM | POA: Diagnosis not present

## 2014-09-18 DIAGNOSIS — Z7982 Long term (current) use of aspirin: Secondary | ICD-10-CM | POA: Diagnosis not present

## 2014-09-18 DIAGNOSIS — I10 Essential (primary) hypertension: Secondary | ICD-10-CM | POA: Diagnosis not present

## 2014-09-18 DIAGNOSIS — C61 Malignant neoplasm of prostate: Secondary | ICD-10-CM | POA: Diagnosis not present

## 2014-09-18 DIAGNOSIS — N401 Enlarged prostate with lower urinary tract symptoms: Secondary | ICD-10-CM | POA: Diagnosis not present

## 2014-09-18 DIAGNOSIS — Z87891 Personal history of nicotine dependence: Secondary | ICD-10-CM | POA: Diagnosis not present

## 2014-09-18 DIAGNOSIS — E785 Hyperlipidemia, unspecified: Secondary | ICD-10-CM | POA: Diagnosis not present

## 2014-09-19 ENCOUNTER — Ambulatory Visit
Admission: RE | Admit: 2014-09-19 | Discharge: 2014-09-19 | Disposition: A | Discharge status: Home / Self Care | Payer: Medicare Other | Source: Ambulatory Visit | Attending: Radiation Oncology | Admitting: Radiation Oncology

## 2014-09-19 ENCOUNTER — Other Ambulatory Visit: Payer: Self-pay

## 2014-09-19 ENCOUNTER — Telehealth: Payer: Self-pay | Admitting: Radiation Oncology

## 2014-09-19 DIAGNOSIS — E785 Hyperlipidemia, unspecified: Secondary | ICD-10-CM | POA: Diagnosis not present

## 2014-09-19 DIAGNOSIS — Z87891 Personal history of nicotine dependence: Secondary | ICD-10-CM | POA: Diagnosis not present

## 2014-09-19 DIAGNOSIS — N529 Male erectile dysfunction, unspecified: Secondary | ICD-10-CM | POA: Diagnosis not present

## 2014-09-19 DIAGNOSIS — Z7982 Long term (current) use of aspirin: Secondary | ICD-10-CM | POA: Diagnosis not present

## 2014-09-19 DIAGNOSIS — N401 Enlarged prostate with lower urinary tract symptoms: Secondary | ICD-10-CM | POA: Diagnosis not present

## 2014-09-19 DIAGNOSIS — C61 Malignant neoplasm of prostate: Secondary | ICD-10-CM | POA: Diagnosis not present

## 2014-09-19 DIAGNOSIS — I10 Essential (primary) hypertension: Secondary | ICD-10-CM | POA: Diagnosis not present

## 2014-09-19 MED ORDER — AMLODIPINE BESYLATE 5 MG PO TABS: 5.0000 mg | ORAL_TABLET | Freq: Every day | ORAL | Status: DC

## 2014-09-19 NOTE — Telephone Encounter (Signed)
Phoned patient's wife after speaking with Dr. Tammi Klippel. Explained that per Dr. Tammi Klippel nasal strips are safe to use during radiation treatment but, to continue to avoid nasal spray as directed by Dr. Valere Dross. Patient's wife, Jeffrey Carter, verbalized understanding and expressed appreciation for the call.

## 2014-09-22 ENCOUNTER — Ambulatory Visit
Admission: RE | Admit: 2014-09-22 | Discharge: 2014-09-22 | Disposition: A | Discharge status: Home / Self Care | Payer: Medicare Other | Source: Ambulatory Visit | Attending: Radiation Oncology | Admitting: Radiation Oncology

## 2014-09-22 ENCOUNTER — Encounter: Payer: Self-pay | Admitting: Radiation Oncology

## 2014-09-22 VITALS — BP 167/83 | HR 70 | Temp 97.8°F | Resp 20 | Ht 69.0 in | Wt 235.3 lb

## 2014-09-22 DIAGNOSIS — Z7982 Long term (current) use of aspirin: Secondary | ICD-10-CM | POA: Diagnosis not present

## 2014-09-22 DIAGNOSIS — N401 Enlarged prostate with lower urinary tract symptoms: Secondary | ICD-10-CM | POA: Diagnosis not present

## 2014-09-22 DIAGNOSIS — Z87891 Personal history of nicotine dependence: Secondary | ICD-10-CM | POA: Diagnosis not present

## 2014-09-22 DIAGNOSIS — I10 Essential (primary) hypertension: Secondary | ICD-10-CM | POA: Diagnosis not present

## 2014-09-22 DIAGNOSIS — C61 Malignant neoplasm of prostate: Secondary | ICD-10-CM | POA: Diagnosis not present

## 2014-09-22 DIAGNOSIS — E785 Hyperlipidemia, unspecified: Secondary | ICD-10-CM | POA: Diagnosis not present

## 2014-09-22 DIAGNOSIS — N529 Male erectile dysfunction, unspecified: Secondary | ICD-10-CM | POA: Diagnosis not present

## 2014-09-22 NOTE — Progress Notes (Signed)
Weekly Management Note:  Site: prostate Current Dose:   3705  cGy Projected Dose:  7800  cGy  Narrative: The patient is seen today for routine under treatment assessment. CBCT/MVCT images/port films were reviewed. The chart was reviewed.    Bladder filling is satisfactory. He does have some worsening urinary frequency and does give a to urinate only once a night.  He has seen a few "spots " of blood in his urine. No GI difficulties.  Physical Examination:  Filed Vitals:   09/22/14 1135  BP: 167/83  Pulse: 70  Temp:   Resp:   .  Weight: 235 lb 4.8 oz (106.731 kg).  No change.  Impression: Tolerating radiation therapy well.  Plan: Continue radiation therapy as planned.

## 2014-09-22 NOTE — Progress Notes (Addendum)
Jeffrey Carter has completed 19 fractions to his prostate.  He denies pain.  He reports an increase in urinary frequency.  He reports getting up 1 time to urinate last night.  He denies dysuria.  He reports seeing blood in his urine "every once in a while."  He denies diarrhea.  He denies fatigue.  BP 153/80 mmHg  Pulse 68  Temp(Src) 97.8 F (36.6 C) (Oral)  Resp 20  Ht 5\' 9"  (1.753 m)  Wt 235 lb 4.8 oz (106.731 kg)  BMI 34.73 kg/m2

## 2014-09-23 ENCOUNTER — Ambulatory Visit
Admission: RE | Admit: 2014-09-23 | Discharge: 2014-09-23 | Disposition: A | Discharge status: Home / Self Care | Payer: Medicare Other | Source: Ambulatory Visit | Attending: Radiation Oncology | Admitting: Radiation Oncology

## 2014-09-23 DIAGNOSIS — N529 Male erectile dysfunction, unspecified: Secondary | ICD-10-CM | POA: Diagnosis not present

## 2014-09-23 DIAGNOSIS — Z7982 Long term (current) use of aspirin: Secondary | ICD-10-CM | POA: Diagnosis not present

## 2014-09-23 DIAGNOSIS — N401 Enlarged prostate with lower urinary tract symptoms: Secondary | ICD-10-CM | POA: Diagnosis not present

## 2014-09-23 DIAGNOSIS — I10 Essential (primary) hypertension: Secondary | ICD-10-CM | POA: Diagnosis not present

## 2014-09-23 DIAGNOSIS — Z87891 Personal history of nicotine dependence: Secondary | ICD-10-CM | POA: Diagnosis not present

## 2014-09-23 DIAGNOSIS — C61 Malignant neoplasm of prostate: Secondary | ICD-10-CM | POA: Diagnosis not present

## 2014-09-23 DIAGNOSIS — E785 Hyperlipidemia, unspecified: Secondary | ICD-10-CM | POA: Diagnosis not present

## 2014-09-24 ENCOUNTER — Ambulatory Visit
Admission: RE | Admit: 2014-09-24 | Discharge: 2014-09-24 | Disposition: A | Discharge status: Home / Self Care | Payer: Medicare Other | Source: Ambulatory Visit | Attending: Radiation Oncology | Admitting: Radiation Oncology

## 2014-09-24 DIAGNOSIS — Z7982 Long term (current) use of aspirin: Secondary | ICD-10-CM | POA: Diagnosis not present

## 2014-09-24 DIAGNOSIS — N529 Male erectile dysfunction, unspecified: Secondary | ICD-10-CM | POA: Diagnosis not present

## 2014-09-24 DIAGNOSIS — C61 Malignant neoplasm of prostate: Secondary | ICD-10-CM | POA: Diagnosis not present

## 2014-09-24 DIAGNOSIS — I10 Essential (primary) hypertension: Secondary | ICD-10-CM | POA: Diagnosis not present

## 2014-09-24 DIAGNOSIS — E785 Hyperlipidemia, unspecified: Secondary | ICD-10-CM | POA: Diagnosis not present

## 2014-09-24 DIAGNOSIS — N401 Enlarged prostate with lower urinary tract symptoms: Secondary | ICD-10-CM | POA: Diagnosis not present

## 2014-09-24 DIAGNOSIS — Z87891 Personal history of nicotine dependence: Secondary | ICD-10-CM | POA: Diagnosis not present

## 2014-09-25 ENCOUNTER — Ambulatory Visit
Admission: RE | Admit: 2014-09-25 | Discharge: 2014-09-25 | Disposition: A | Discharge status: Home / Self Care | Payer: Medicare Other | Source: Ambulatory Visit | Attending: Radiation Oncology | Admitting: Radiation Oncology

## 2014-09-25 DIAGNOSIS — N529 Male erectile dysfunction, unspecified: Secondary | ICD-10-CM | POA: Diagnosis not present

## 2014-09-25 DIAGNOSIS — E785 Hyperlipidemia, unspecified: Secondary | ICD-10-CM | POA: Diagnosis not present

## 2014-09-25 DIAGNOSIS — Z87891 Personal history of nicotine dependence: Secondary | ICD-10-CM | POA: Diagnosis not present

## 2014-09-25 DIAGNOSIS — N401 Enlarged prostate with lower urinary tract symptoms: Secondary | ICD-10-CM | POA: Diagnosis not present

## 2014-09-25 DIAGNOSIS — Z7982 Long term (current) use of aspirin: Secondary | ICD-10-CM | POA: Diagnosis not present

## 2014-09-25 DIAGNOSIS — I10 Essential (primary) hypertension: Secondary | ICD-10-CM | POA: Diagnosis not present

## 2014-09-25 DIAGNOSIS — C61 Malignant neoplasm of prostate: Secondary | ICD-10-CM | POA: Diagnosis not present

## 2014-09-26 ENCOUNTER — Ambulatory Visit
Admission: RE | Admit: 2014-09-26 | Discharge: 2014-09-26 | Disposition: A | Discharge status: Home / Self Care | Payer: Medicare Other | Source: Ambulatory Visit | Attending: Radiation Oncology | Admitting: Radiation Oncology

## 2014-09-26 DIAGNOSIS — Z7982 Long term (current) use of aspirin: Secondary | ICD-10-CM | POA: Diagnosis not present

## 2014-09-26 DIAGNOSIS — Z87891 Personal history of nicotine dependence: Secondary | ICD-10-CM | POA: Diagnosis not present

## 2014-09-26 DIAGNOSIS — N529 Male erectile dysfunction, unspecified: Secondary | ICD-10-CM | POA: Diagnosis not present

## 2014-09-26 DIAGNOSIS — I10 Essential (primary) hypertension: Secondary | ICD-10-CM | POA: Diagnosis not present

## 2014-09-26 DIAGNOSIS — N401 Enlarged prostate with lower urinary tract symptoms: Secondary | ICD-10-CM | POA: Diagnosis not present

## 2014-09-26 DIAGNOSIS — E785 Hyperlipidemia, unspecified: Secondary | ICD-10-CM | POA: Diagnosis not present

## 2014-09-26 DIAGNOSIS — C61 Malignant neoplasm of prostate: Secondary | ICD-10-CM | POA: Diagnosis not present

## 2014-09-30 ENCOUNTER — Ambulatory Visit
Admission: RE | Admit: 2014-09-30 | Discharge: 2014-09-30 | Disposition: A | Discharge status: Home / Self Care | Payer: Medicare Other | Source: Ambulatory Visit | Attending: Radiation Oncology | Admitting: Radiation Oncology

## 2014-09-30 ENCOUNTER — Encounter: Payer: Self-pay | Admitting: Radiation Oncology

## 2014-09-30 VITALS — BP 152/96 | HR 81 | Temp 97.6°F | Ht 69.0 in | Wt 231.9 lb

## 2014-09-30 DIAGNOSIS — I10 Essential (primary) hypertension: Secondary | ICD-10-CM | POA: Diagnosis not present

## 2014-09-30 DIAGNOSIS — E785 Hyperlipidemia, unspecified: Secondary | ICD-10-CM | POA: Diagnosis not present

## 2014-09-30 DIAGNOSIS — N401 Enlarged prostate with lower urinary tract symptoms: Secondary | ICD-10-CM | POA: Diagnosis not present

## 2014-09-30 DIAGNOSIS — C61 Malignant neoplasm of prostate: Secondary | ICD-10-CM

## 2014-09-30 DIAGNOSIS — N529 Male erectile dysfunction, unspecified: Secondary | ICD-10-CM | POA: Diagnosis not present

## 2014-09-30 DIAGNOSIS — G4733 Obstructive sleep apnea (adult) (pediatric): Secondary | ICD-10-CM | POA: Diagnosis not present

## 2014-09-30 DIAGNOSIS — Z7982 Long term (current) use of aspirin: Secondary | ICD-10-CM | POA: Diagnosis not present

## 2014-09-30 DIAGNOSIS — Z87891 Personal history of nicotine dependence: Secondary | ICD-10-CM | POA: Diagnosis not present

## 2014-09-30 NOTE — Progress Notes (Signed)
Weekly Management Note:  Site: Prostate Current Dose:  4680  cGy Projected Dose: 7800  cGy  Narrative: The patient is seen today for routine under treatment assessment. CBCT/MVCT images/port films were reviewed. The chart was reviewed.   By filling satisfactory.  No new GU or GI difficulties.  He does have nocturia 2.  Physical Examination:  Filed Vitals:   09/30/14 1123  BP: 152/96  Pulse: 81  Temp:   .  Weight: 231 lb 14.4 oz (105.189 kg).  No change.  Impression: Tolerating radiation therapy well.  Plan: Continue radiation therapy as planned.

## 2014-09-30 NOTE — Progress Notes (Signed)
Mr. Jeffrey Carter reports urinary frequency every "couple" of hours during the day and nocturia x 2.  He states he does empty completely and denies any dribbling.  Denies any proctitis and has normal stools.

## 2014-10-01 ENCOUNTER — Ambulatory Visit
Admission: RE | Admit: 2014-10-01 | Discharge: 2014-10-01 | Disposition: A | Discharge status: Home / Self Care | Payer: Medicare Other | Source: Ambulatory Visit | Attending: Radiation Oncology | Admitting: Radiation Oncology

## 2014-10-01 DIAGNOSIS — N401 Enlarged prostate with lower urinary tract symptoms: Secondary | ICD-10-CM | POA: Diagnosis not present

## 2014-10-01 DIAGNOSIS — N529 Male erectile dysfunction, unspecified: Secondary | ICD-10-CM | POA: Diagnosis not present

## 2014-10-01 DIAGNOSIS — Z87891 Personal history of nicotine dependence: Secondary | ICD-10-CM | POA: Diagnosis not present

## 2014-10-01 DIAGNOSIS — C61 Malignant neoplasm of prostate: Secondary | ICD-10-CM | POA: Diagnosis not present

## 2014-10-01 DIAGNOSIS — I10 Essential (primary) hypertension: Secondary | ICD-10-CM | POA: Diagnosis not present

## 2014-10-01 DIAGNOSIS — E785 Hyperlipidemia, unspecified: Secondary | ICD-10-CM | POA: Diagnosis not present

## 2014-10-01 DIAGNOSIS — Z7982 Long term (current) use of aspirin: Secondary | ICD-10-CM | POA: Diagnosis not present

## 2014-10-02 ENCOUNTER — Ambulatory Visit
Admission: RE | Admit: 2014-10-02 | Discharge: 2014-10-02 | Disposition: A | Discharge status: Home / Self Care | Payer: Medicare Other | Source: Ambulatory Visit | Attending: Radiation Oncology | Admitting: Radiation Oncology

## 2014-10-02 DIAGNOSIS — E785 Hyperlipidemia, unspecified: Secondary | ICD-10-CM | POA: Diagnosis not present

## 2014-10-02 DIAGNOSIS — C61 Malignant neoplasm of prostate: Secondary | ICD-10-CM | POA: Diagnosis not present

## 2014-10-02 DIAGNOSIS — Z7982 Long term (current) use of aspirin: Secondary | ICD-10-CM | POA: Diagnosis not present

## 2014-10-02 DIAGNOSIS — N401 Enlarged prostate with lower urinary tract symptoms: Secondary | ICD-10-CM | POA: Diagnosis not present

## 2014-10-02 DIAGNOSIS — N529 Male erectile dysfunction, unspecified: Secondary | ICD-10-CM | POA: Diagnosis not present

## 2014-10-02 DIAGNOSIS — Z87891 Personal history of nicotine dependence: Secondary | ICD-10-CM | POA: Diagnosis not present

## 2014-10-02 DIAGNOSIS — I10 Essential (primary) hypertension: Secondary | ICD-10-CM | POA: Diagnosis not present

## 2014-10-03 ENCOUNTER — Ambulatory Visit
Admission: RE | Admit: 2014-10-03 | Discharge: 2014-10-03 | Disposition: A | Discharge status: Home / Self Care | Payer: Medicare Other | Source: Ambulatory Visit | Attending: Radiation Oncology | Admitting: Radiation Oncology

## 2014-10-03 DIAGNOSIS — N401 Enlarged prostate with lower urinary tract symptoms: Secondary | ICD-10-CM | POA: Diagnosis not present

## 2014-10-03 DIAGNOSIS — I10 Essential (primary) hypertension: Secondary | ICD-10-CM | POA: Diagnosis not present

## 2014-10-03 DIAGNOSIS — N529 Male erectile dysfunction, unspecified: Secondary | ICD-10-CM | POA: Diagnosis not present

## 2014-10-03 DIAGNOSIS — E785 Hyperlipidemia, unspecified: Secondary | ICD-10-CM | POA: Diagnosis not present

## 2014-10-03 DIAGNOSIS — Z87891 Personal history of nicotine dependence: Secondary | ICD-10-CM | POA: Diagnosis not present

## 2014-10-03 DIAGNOSIS — C61 Malignant neoplasm of prostate: Secondary | ICD-10-CM | POA: Diagnosis not present

## 2014-10-03 DIAGNOSIS — Z7982 Long term (current) use of aspirin: Secondary | ICD-10-CM | POA: Diagnosis not present

## 2014-10-06 ENCOUNTER — Ambulatory Visit
Admission: RE | Admit: 2014-10-06 | Discharge: 2014-10-06 | Disposition: A | Discharge status: Home / Self Care | Payer: Medicare Other | Source: Ambulatory Visit | Attending: Radiation Oncology | Admitting: Radiation Oncology

## 2014-10-06 VITALS — BP 148/80 | HR 69 | Temp 97.4°F | Wt 234.7 lb

## 2014-10-06 DIAGNOSIS — E785 Hyperlipidemia, unspecified: Secondary | ICD-10-CM | POA: Diagnosis not present

## 2014-10-06 DIAGNOSIS — C61 Malignant neoplasm of prostate: Secondary | ICD-10-CM

## 2014-10-06 DIAGNOSIS — Z7982 Long term (current) use of aspirin: Secondary | ICD-10-CM | POA: Diagnosis not present

## 2014-10-06 DIAGNOSIS — N529 Male erectile dysfunction, unspecified: Secondary | ICD-10-CM | POA: Diagnosis not present

## 2014-10-06 DIAGNOSIS — I10 Essential (primary) hypertension: Secondary | ICD-10-CM | POA: Diagnosis not present

## 2014-10-06 DIAGNOSIS — N401 Enlarged prostate with lower urinary tract symptoms: Secondary | ICD-10-CM | POA: Diagnosis not present

## 2014-10-06 DIAGNOSIS — Z87891 Personal history of nicotine dependence: Secondary | ICD-10-CM | POA: Diagnosis not present

## 2014-10-06 NOTE — Progress Notes (Signed)
Weekly Management Note:  Site: Prostate Current Dose:  5460  cGy Projected Dose: 7800  cGy  Narrative: The patient is seen today for routine under treatment assessment. CBCT/MVCT images/port films were reviewed. The chart was reviewed.   No new GU or GI difficulties.  His bladder filling is excellent.  Physical Examination:  Filed Vitals:   10/06/14 1121  BP: 148/80  Pulse: 69  Temp: 97.4 F (36.3 C)  .  Weight: 234 lb 11.2 oz (106.459 kg).  No change.  Impression: Tolerating radiation therapy well.  Plan: Continue radiation therapy as planned.

## 2014-10-06 NOTE — Progress Notes (Signed)
Weekly assessment of radiation to prostate.Completed 28 of 40 treatments.Has intermittent burning on urination.Frequency/urgency of urination unchanged.Nocturia up to 3 times. BP 148/80 mmHg  Pulse 69  Temp(Src) 97.4 F (36.3 C)  Wt 234 lb 11.2 oz (106.459 kg)

## 2014-10-07 ENCOUNTER — Ambulatory Visit
Admission: RE | Admit: 2014-10-07 | Discharge: 2014-10-07 | Disposition: A | Discharge status: Home / Self Care | Payer: Medicare Other | Source: Ambulatory Visit | Attending: Radiation Oncology | Admitting: Radiation Oncology

## 2014-10-07 DIAGNOSIS — I10 Essential (primary) hypertension: Secondary | ICD-10-CM | POA: Diagnosis not present

## 2014-10-07 DIAGNOSIS — Z7982 Long term (current) use of aspirin: Secondary | ICD-10-CM | POA: Diagnosis not present

## 2014-10-07 DIAGNOSIS — N529 Male erectile dysfunction, unspecified: Secondary | ICD-10-CM | POA: Diagnosis not present

## 2014-10-07 DIAGNOSIS — N401 Enlarged prostate with lower urinary tract symptoms: Secondary | ICD-10-CM | POA: Diagnosis not present

## 2014-10-07 DIAGNOSIS — Z87891 Personal history of nicotine dependence: Secondary | ICD-10-CM | POA: Diagnosis not present

## 2014-10-07 DIAGNOSIS — E785 Hyperlipidemia, unspecified: Secondary | ICD-10-CM | POA: Diagnosis not present

## 2014-10-07 DIAGNOSIS — C61 Malignant neoplasm of prostate: Secondary | ICD-10-CM | POA: Diagnosis not present

## 2014-10-08 ENCOUNTER — Ambulatory Visit
Admission: RE | Admit: 2014-10-08 | Discharge: 2014-10-08 | Disposition: A | Discharge status: Home / Self Care | Payer: Medicare Other | Source: Ambulatory Visit | Attending: Radiation Oncology | Admitting: Radiation Oncology

## 2014-10-08 DIAGNOSIS — E785 Hyperlipidemia, unspecified: Secondary | ICD-10-CM | POA: Diagnosis not present

## 2014-10-08 DIAGNOSIS — N401 Enlarged prostate with lower urinary tract symptoms: Secondary | ICD-10-CM | POA: Diagnosis not present

## 2014-10-08 DIAGNOSIS — C61 Malignant neoplasm of prostate: Secondary | ICD-10-CM | POA: Diagnosis not present

## 2014-10-08 DIAGNOSIS — Z7982 Long term (current) use of aspirin: Secondary | ICD-10-CM | POA: Diagnosis not present

## 2014-10-08 DIAGNOSIS — I10 Essential (primary) hypertension: Secondary | ICD-10-CM | POA: Diagnosis not present

## 2014-10-08 DIAGNOSIS — N529 Male erectile dysfunction, unspecified: Secondary | ICD-10-CM | POA: Diagnosis not present

## 2014-10-08 DIAGNOSIS — Z87891 Personal history of nicotine dependence: Secondary | ICD-10-CM | POA: Diagnosis not present

## 2014-10-09 ENCOUNTER — Ambulatory Visit (INDEPENDENT_AMBULATORY_CARE_PROVIDER_SITE_OTHER): Payer: Medicare Other

## 2014-10-09 ENCOUNTER — Ambulatory Visit
Admission: RE | Admit: 2014-10-09 | Discharge: 2014-10-09 | Disposition: A | Discharge status: Home / Self Care | Payer: Medicare Other | Source: Ambulatory Visit | Attending: Radiation Oncology | Admitting: Radiation Oncology

## 2014-10-09 DIAGNOSIS — Z23 Encounter for immunization: Secondary | ICD-10-CM | POA: Diagnosis not present

## 2014-10-09 DIAGNOSIS — C61 Malignant neoplasm of prostate: Secondary | ICD-10-CM | POA: Diagnosis not present

## 2014-10-09 DIAGNOSIS — Z7982 Long term (current) use of aspirin: Secondary | ICD-10-CM | POA: Diagnosis not present

## 2014-10-09 DIAGNOSIS — N401 Enlarged prostate with lower urinary tract symptoms: Secondary | ICD-10-CM | POA: Diagnosis not present

## 2014-10-09 DIAGNOSIS — I10 Essential (primary) hypertension: Secondary | ICD-10-CM | POA: Diagnosis not present

## 2014-10-09 DIAGNOSIS — E785 Hyperlipidemia, unspecified: Secondary | ICD-10-CM | POA: Diagnosis not present

## 2014-10-09 DIAGNOSIS — Z87891 Personal history of nicotine dependence: Secondary | ICD-10-CM | POA: Diagnosis not present

## 2014-10-09 DIAGNOSIS — N529 Male erectile dysfunction, unspecified: Secondary | ICD-10-CM | POA: Diagnosis not present

## 2014-10-10 ENCOUNTER — Ambulatory Visit
Admission: RE | Admit: 2014-10-10 | Discharge: 2014-10-10 | Disposition: A | Discharge status: Home / Self Care | Payer: Medicare Other | Source: Ambulatory Visit | Attending: Radiation Oncology | Admitting: Radiation Oncology

## 2014-10-10 DIAGNOSIS — Z87891 Personal history of nicotine dependence: Secondary | ICD-10-CM | POA: Diagnosis not present

## 2014-10-10 DIAGNOSIS — E785 Hyperlipidemia, unspecified: Secondary | ICD-10-CM | POA: Diagnosis not present

## 2014-10-10 DIAGNOSIS — N401 Enlarged prostate with lower urinary tract symptoms: Secondary | ICD-10-CM | POA: Diagnosis not present

## 2014-10-10 DIAGNOSIS — C61 Malignant neoplasm of prostate: Secondary | ICD-10-CM | POA: Diagnosis not present

## 2014-10-10 DIAGNOSIS — I10 Essential (primary) hypertension: Secondary | ICD-10-CM | POA: Diagnosis not present

## 2014-10-10 DIAGNOSIS — N529 Male erectile dysfunction, unspecified: Secondary | ICD-10-CM | POA: Diagnosis not present

## 2014-10-10 DIAGNOSIS — Z7982 Long term (current) use of aspirin: Secondary | ICD-10-CM | POA: Diagnosis not present

## 2014-10-12 ENCOUNTER — Encounter (HOSPITAL_COMMUNITY): Payer: Self-pay

## 2014-10-12 ENCOUNTER — Emergency Department (HOSPITAL_COMMUNITY)
Admission: EM | Admit: 2014-10-12 | Discharge: 2014-10-12 | Disposition: A | Discharge status: Home / Self Care | Payer: Medicare Other | Attending: Emergency Medicine | Admitting: Emergency Medicine

## 2014-10-12 DIAGNOSIS — N400 Enlarged prostate without lower urinary tract symptoms: Secondary | ICD-10-CM | POA: Diagnosis not present

## 2014-10-12 DIAGNOSIS — Z87891 Personal history of nicotine dependence: Secondary | ICD-10-CM | POA: Insufficient documentation

## 2014-10-12 DIAGNOSIS — I10 Essential (primary) hypertension: Secondary | ICD-10-CM | POA: Diagnosis not present

## 2014-10-12 DIAGNOSIS — Z7982 Long term (current) use of aspirin: Secondary | ICD-10-CM | POA: Insufficient documentation

## 2014-10-12 DIAGNOSIS — R319 Hematuria, unspecified: Secondary | ICD-10-CM | POA: Diagnosis not present

## 2014-10-12 DIAGNOSIS — Z8546 Personal history of malignant neoplasm of prostate: Secondary | ICD-10-CM | POA: Diagnosis not present

## 2014-10-12 DIAGNOSIS — G4733 Obstructive sleep apnea (adult) (pediatric): Secondary | ICD-10-CM | POA: Insufficient documentation

## 2014-10-12 DIAGNOSIS — Z79899 Other long term (current) drug therapy: Secondary | ICD-10-CM | POA: Diagnosis not present

## 2014-10-12 DIAGNOSIS — E785 Hyperlipidemia, unspecified: Secondary | ICD-10-CM | POA: Diagnosis not present

## 2014-10-12 DIAGNOSIS — Z9981 Dependence on supplemental oxygen: Secondary | ICD-10-CM | POA: Diagnosis not present

## 2014-10-12 DIAGNOSIS — M1990 Unspecified osteoarthritis, unspecified site: Secondary | ICD-10-CM | POA: Diagnosis not present

## 2014-10-12 LAB — PROTIME-INR
INR: 1.01 (ref 0.00–1.49)
Prothrombin Time: 13.5 seconds (ref 11.6–15.2)

## 2014-10-12 LAB — COMPREHENSIVE METABOLIC PANEL
ALT: 26 U/L (ref 17–63)
AST: 22 U/L (ref 15–41)
Albumin: 3.9 g/dL (ref 3.5–5.0)
Alkaline Phosphatase: 75 U/L (ref 38–126)
Anion gap: 7 (ref 5–15)
BUN: 18 mg/dL (ref 6–20)
CO2: 26 mmol/L (ref 22–32)
Calcium: 9.1 mg/dL (ref 8.9–10.3)
Chloride: 109 mmol/L (ref 101–111)
Creatinine, Ser: 1 mg/dL (ref 0.61–1.24)
GFR calc Af Amer: >60 mL/min (ref >60)
GFR calc non Af Amer: >60 mL/min (ref >60)
Glucose, Bld: 124 mg/dL — ABNORMAL HIGH (ref 65–99)
Potassium: 3.5 mmol/L (ref 3.5–5.1)
Sodium: 142 mmol/L (ref 135–145)
Total Bilirubin: 0.5 mg/dL (ref 0.3–1.2)
Total Protein: 6.8 g/dL (ref 6.5–8.1)

## 2014-10-12 LAB — CBC WITH DIFFERENTIAL/PLATELET
Basophils Absolute: 0 10*3/uL (ref 0.0–0.1)
Basophils Relative: 0 %
Eosinophils Absolute: 0.3 10*3/uL (ref 0.0–0.7)
Eosinophils Relative: 9 %
HEMATOCRIT: 34.3 % — AB (ref 39.0–52.0)
Hemoglobin: 11.6 g/dL — ABNORMAL LOW (ref 13.0–17.0)
LYMPHS ABS: 0.9 10*3/uL (ref 0.7–4.0)
Lymphocytes Relative: 29 %
MCH: 28.1 pg (ref 26.0–34.0)
MCHC: 33.8 g/dL (ref 30.0–36.0)
MCV: 83.1 fL (ref 78.0–100.0)
MONOS PCT: 12 %
Monocytes Absolute: 0.4 10*3/uL (ref 0.1–1.0)
NEUTROS ABS: 1.5 10*3/uL — AB (ref 1.7–7.7)
Neutrophils Relative %: 50 %
Platelets: 165 10*3/uL (ref 150–400)
RBC: 4.13 MIL/uL — AB (ref 4.22–5.81)
RDW: 13.9 % (ref 11.5–15.5)
WBC: 3 10*3/uL — AB (ref 4.0–10.5)

## 2014-10-12 LAB — URINE MICROSCOPIC-ADD ON

## 2014-10-12 LAB — URINALYSIS, ROUTINE W REFLEX MICROSCOPIC
Bilirubin Urine: NEGATIVE
Glucose, UA: NEGATIVE mg/dL
Ketones, ur: NEGATIVE mg/dL
NITRITE: NEGATIVE
PH: 6.5 (ref 5.0–8.0)
Protein, ur: NEGATIVE mg/dL
SPECIFIC GRAVITY, URINE: 1.015 (ref 1.005–1.030)
Urobilinogen, UA: 0.2 mg/dL (ref 0.0–1.0)

## 2014-10-12 MED ORDER — LIDOCAINE HCL 2 % EX GEL
1.0000 "application " | Freq: Once | CUTANEOUS | Status: AC
  Administered 2014-10-12: 1 via URETHRAL
  Filled 2014-10-12: qty 10

## 2014-10-12 NOTE — ED Notes (Signed)
Patient states he had blood in his urine this AM when he woke. Patient states he has prostate cancer and his currently taking radiation treatment and the last treatment was 2 days ago.

## 2014-10-12 NOTE — Discharge Instructions (Signed)

## 2014-10-12 NOTE — ED Notes (Signed)
He has just voided; his urine being blood-tinged.  Upon being finished with voiding he dribbles grossly bloody drainage which is in appearance as venous blood--est. Amt. 20 ml.

## 2014-10-12 NOTE — ED Provider Notes (Signed)
CSN: [id number]     Arrival date & time 10/12/14  3664 History   First MD Initiated Contact with Patient 10/12/14 0750     Chief Complaint  Patient presents with  . Hematuria      Patient is a 74 y.o. male presenting with hematuria.  Hematuria  Patient states he had blood in his urine this AM when he woke. Patient states he has prostate cancer and his currently taking radiation treatment and the last treatment was 2 days ago  Past Medical History  Diagnosis Date  . Hypertension   . Osteoarthritis   . Hyperlipidemia   . BPH (benign prostatic hyperplasia)   . Lower urinary tract symptoms (LUTS)   . History of gout     BIG TOE  . OSA on CPAP     MODERATE PER STUDY 03-08-2012  . Wears glasses   . First degree heart block   . Prostate cancer urologist-  dr nesi/  oncologist-  dr Ander Slade    Stage T1c,  Gleason 3+4,  PSA 10.57,  vol 117ml   Past Surgical History  Procedure Laterality Date  . Total knee arthroplasty Right 06-22-2007  . Colonoscopy  07-06-2006  . Tonsillectomy  as child  . Appendectomy  1966  . Prostate biopsy N/A 03/28/2014    Procedure: BIOPSY TRANSRECTAL ULTRASONIC PROSTATE (TUBP);  Surgeon: Arvil Persons, MD;  Location: [facility];  Service: Urology;  Laterality: N/A;  Girtha Rm seed implant N/A 08/12/2014    Procedure: GOLD SEED IMPLANT;  Surgeon: Lowella Bandy, MD;  Location: [facility];  Service: Urology;  Laterality: N/A;   Family History  Problem Relation Age of Onset  . Cancer Brother     pancreatic, stomach  . Diabetes Father    Social History  Substance Use Topics  . Smoking status: Former Smoker -- 0.25 packs/day for 8 years    Types: Cigarettes    Quit date: 01/24/1962  . Smokeless tobacco: Never Used  . Alcohol Use: No    Review of Systems  Genitourinary: Positive for hematuria.  All other systems reviewed and are negative.     Allergies  Review of patient's allergies indicates no known allergies.  Home  Medications   Prior to Admission medications   Medication Sig Start Date End Date Taking? Authorizing Provider  amLODipine (NORVASC) 5 MG tablet Take 1 tablet (5 mg total) by mouth daily. 09/19/14  Yes Aleksei Plotnikov V, MD  aspirin 81 MG tablet Take 81 mg by mouth daily.     Yes Historical Provider, MD  cloNIDine (CATAPRES) 0.2 MG tablet Take 1 tablet (0.2 mg total) by mouth 2 (two) times daily. 08/13/14  Yes Aleksei Plotnikov V, MD  finasteride (PROSCAR) 5 MG tablet TAKE ONE TABLET BY MOUTH ONCE DAILY 09/11/14  Yes Aleksei Plotnikov V, MD  losartan (COZAAR) 100 MG tablet Take 1 tablet (100 mg total) by mouth daily. Patient taking differently: Take 100 mg by mouth every morning.  12/23/13  Yes Aleksei Plotnikov V, MD  lovastatin (MEVACOR) 40 MG tablet Take 1 tablet (40 mg total) by mouth daily at 6 PM. 10/29/13  Yes Aleksei Plotnikov V, MD  oxymetazoline (AFRIN NASAL SPRAY) 0.05 % nasal spray Place 2 sprays into the nose 2 (two) times daily. Patient taking differently: Place 2 sprays into the nose 2 (two) times daily as needed.  11/29/11  Yes Clayton Bibles, PA-C  tamsulosin (FLOMAX) 0.4 MG CAPS capsule Take 1 capsule (0.4 mg total) by  mouth daily. 09/15/14  Yes Arloa Koh, MD  furosemide (LASIX) 40 MG tablet TAKE ONE TABLET BY MOUTH ONCE DAILY PRN swelling Patient not taking: Reported on 10/12/2014 09/15/14   Tyrone Apple Plotnikov V, MD  oxybutynin (DITROPAN) 5 MG tablet Take 1 tablet (5 mg total) by mouth every 8 (eight) hours as needed for bladder spasms. Patient not taking: Reported on 10/12/2014 09/15/14   Aleksei Plotnikov V, MD   BP 162/104 mmHg  Pulse 73  Temp(Src) 97.4 F (36.3 C) (Oral)  Resp 16  Ht 5\' 9"  (1.753 m)  Wt 234 lb (106.142 kg)  BMI 34.54 kg/m2  SpO2 100% Physical Exam  Constitutional: He is oriented to person, place, and time. He appears well-developed and well-nourished. No distress.  HENT:  Head: Normocephalic and atraumatic.  Eyes: Pupils are equal, round, and reactive to  light.  Neck: Normal range of motion.  Cardiovascular: Normal rate and intact distal pulses.   Pulmonary/Chest: No respiratory distress.  Abdominal: Normal appearance. He exhibits no distension.  Genitourinary: Testes normal. Right testis shows no swelling. Left testis shows no swelling.  Musculoskeletal: Normal range of motion.  Neurological: He is alert and oriented to person, place, and time. No cranial nerve deficit.  Skin: Skin is warm and dry. No rash noted.  Psychiatric: He has a normal mood and affect. His behavior is normal.  Nursing note and vitals reviewed.   ED Course  Procedures (including critical care time) Foley catheter was placed with irrigation until clear. Labs Review Labs Reviewed  URINALYSIS, ROUTINE W REFLEX MICROSCOPIC (NOT AT [facility]) - Abnormal; Notable for the following:    APPearance CLOUDY (*)    Hgb urine dipstick LARGE (*)    Leukocytes, UA TRACE (*)    All other components within normal limits  CBC WITH DIFFERENTIAL/PLATELET - Abnormal; Notable for the following:    WBC 3.0 (*)    RBC 4.13 (*)    Hemoglobin 11.6 (*)    HCT 34.3 (*)    Neutro Abs 1.5 (*)    All other components within normal limits  COMPREHENSIVE METABOLIC PANEL - Abnormal; Notable for the following:    Glucose, Bld 124 (*)    All other components within normal limits  PROTIME-INR  URINE MICROSCOPIC-ADD ON    Imaging Review No results found. I have personally reviewed and evaluated these images and lab results as part of my medical decision-making.  Patient was seen by urology in the emergency department they recommended the Foley be discharged and patient be discharged home.  Follow-up with urology next week.  MDM   Final diagnoses:  Hematuria        Leonard Schwartz, MD 10/16/14 [phone]

## 2014-10-12 NOTE — ED Notes (Signed)
He remains comfortable, and is visiting with his wife.

## 2014-10-13 ENCOUNTER — Ambulatory Visit
Admission: RE | Admit: 2014-10-13 | Discharge: 2014-10-13 | Disposition: A | Discharge status: Home / Self Care | Payer: Medicare Other | Source: Ambulatory Visit | Attending: Radiation Oncology | Admitting: Radiation Oncology

## 2014-10-13 ENCOUNTER — Ambulatory Visit
Admission: RE | Admit: 2014-10-13 | Discharge: 2014-10-13 | Disposition: A | Discharge status: Home / Self Care | Payer: Medicare Other | Source: Ambulatory Visit | Admitting: Radiation Oncology

## 2014-10-13 VITALS — BP 161/76 | HR 58 | Temp 97.7°F | Ht 69.0 in | Wt 234.2 lb

## 2014-10-13 DIAGNOSIS — N401 Enlarged prostate with lower urinary tract symptoms: Secondary | ICD-10-CM | POA: Diagnosis not present

## 2014-10-13 DIAGNOSIS — I10 Essential (primary) hypertension: Secondary | ICD-10-CM | POA: Diagnosis not present

## 2014-10-13 DIAGNOSIS — Z87891 Personal history of nicotine dependence: Secondary | ICD-10-CM | POA: Diagnosis not present

## 2014-10-13 DIAGNOSIS — N529 Male erectile dysfunction, unspecified: Secondary | ICD-10-CM | POA: Insufficient documentation

## 2014-10-13 DIAGNOSIS — E785 Hyperlipidemia, unspecified: Secondary | ICD-10-CM | POA: Insufficient documentation

## 2014-10-13 DIAGNOSIS — Z7982 Long term (current) use of aspirin: Secondary | ICD-10-CM | POA: Insufficient documentation

## 2014-10-13 DIAGNOSIS — C61 Malignant neoplasm of prostate: Secondary | ICD-10-CM | POA: Diagnosis not present

## 2014-10-13 NOTE — Progress Notes (Signed)
Weekly Management Note:  Site: prostate Current Dose:   6630  cGy Projected Dose:  7800  cGy  Narrative: The patient is seen today for routine under treatment assessment. CBCT/MVCT images/port films were reviewed. The chart was reviewed.    Blood filling satisfactory. No GU or GI difficulties except that he did have an episode of gross hematuria over the weekend and he visited the emergency room.  He is to see Dr. Janice Norrie later today.  Physical Examination:  Filed Vitals:   10/13/14 1143  BP: 161/76  Pulse: 58  Temp: 97.7 F (36.5 C)  .  Weight: 234 lb 3.2 oz (106.232 kg).  No change.  Impression: Tolerating radiation therapy well.  He may have had acute cystitis secondary to radiation therapy resulting in gross hematuria.  Plan: Continue radiation therapy as planned.

## 2014-10-13 NOTE — Progress Notes (Signed)
Jeffrey Carter had an ED encounter on yesterday due to hematuria.  Today he reports burning upon urination since having catheter removed after his ED visit. Nocturia x 2 and urinary urgency.  Denies any bowel changes, nor proctitis.

## 2014-10-14 ENCOUNTER — Ambulatory Visit
Admission: RE | Admit: 2014-10-14 | Discharge: 2014-10-14 | Disposition: A | Discharge status: Home / Self Care | Payer: Medicare Other | Source: Ambulatory Visit | Attending: Radiation Oncology | Admitting: Radiation Oncology

## 2014-10-14 DIAGNOSIS — N401 Enlarged prostate with lower urinary tract symptoms: Secondary | ICD-10-CM | POA: Diagnosis not present

## 2014-10-14 DIAGNOSIS — E785 Hyperlipidemia, unspecified: Secondary | ICD-10-CM | POA: Diagnosis not present

## 2014-10-14 DIAGNOSIS — Z87891 Personal history of nicotine dependence: Secondary | ICD-10-CM | POA: Diagnosis not present

## 2014-10-14 DIAGNOSIS — N529 Male erectile dysfunction, unspecified: Secondary | ICD-10-CM | POA: Diagnosis not present

## 2014-10-14 DIAGNOSIS — C61 Malignant neoplasm of prostate: Secondary | ICD-10-CM | POA: Diagnosis not present

## 2014-10-14 DIAGNOSIS — Z7982 Long term (current) use of aspirin: Secondary | ICD-10-CM | POA: Diagnosis not present

## 2014-10-14 DIAGNOSIS — I10 Essential (primary) hypertension: Secondary | ICD-10-CM | POA: Diagnosis not present

## 2014-10-15 ENCOUNTER — Ambulatory Visit
Admission: RE | Admit: 2014-10-15 | Discharge: 2014-10-15 | Disposition: A | Discharge status: Home / Self Care | Payer: Medicare Other | Source: Ambulatory Visit | Attending: Radiation Oncology | Admitting: Radiation Oncology

## 2014-10-15 DIAGNOSIS — N529 Male erectile dysfunction, unspecified: Secondary | ICD-10-CM | POA: Diagnosis not present

## 2014-10-15 DIAGNOSIS — Z7982 Long term (current) use of aspirin: Secondary | ICD-10-CM | POA: Diagnosis not present

## 2014-10-15 DIAGNOSIS — N401 Enlarged prostate with lower urinary tract symptoms: Secondary | ICD-10-CM | POA: Diagnosis not present

## 2014-10-15 DIAGNOSIS — I10 Essential (primary) hypertension: Secondary | ICD-10-CM | POA: Diagnosis not present

## 2014-10-15 DIAGNOSIS — C61 Malignant neoplasm of prostate: Secondary | ICD-10-CM | POA: Diagnosis not present

## 2014-10-15 DIAGNOSIS — E785 Hyperlipidemia, unspecified: Secondary | ICD-10-CM | POA: Diagnosis not present

## 2014-10-15 DIAGNOSIS — Z87891 Personal history of nicotine dependence: Secondary | ICD-10-CM | POA: Diagnosis not present

## 2014-10-16 ENCOUNTER — Ambulatory Visit
Admission: RE | Admit: 2014-10-16 | Discharge: 2014-10-16 | Disposition: A | Discharge status: Home / Self Care | Payer: Medicare Other | Source: Ambulatory Visit | Attending: Radiation Oncology | Admitting: Radiation Oncology

## 2014-10-16 DIAGNOSIS — N529 Male erectile dysfunction, unspecified: Secondary | ICD-10-CM | POA: Diagnosis not present

## 2014-10-16 DIAGNOSIS — Z7982 Long term (current) use of aspirin: Secondary | ICD-10-CM | POA: Diagnosis not present

## 2014-10-16 DIAGNOSIS — C61 Malignant neoplasm of prostate: Secondary | ICD-10-CM | POA: Diagnosis not present

## 2014-10-16 DIAGNOSIS — E785 Hyperlipidemia, unspecified: Secondary | ICD-10-CM | POA: Diagnosis not present

## 2014-10-16 DIAGNOSIS — Z87891 Personal history of nicotine dependence: Secondary | ICD-10-CM | POA: Diagnosis not present

## 2014-10-16 DIAGNOSIS — I10 Essential (primary) hypertension: Secondary | ICD-10-CM | POA: Diagnosis not present

## 2014-10-16 DIAGNOSIS — N401 Enlarged prostate with lower urinary tract symptoms: Secondary | ICD-10-CM | POA: Diagnosis not present

## 2014-10-17 ENCOUNTER — Ambulatory Visit
Admission: RE | Admit: 2014-10-17 | Discharge: 2014-10-17 | Disposition: A | Discharge status: Home / Self Care | Payer: Medicare Other | Source: Ambulatory Visit | Attending: Radiation Oncology | Admitting: Radiation Oncology

## 2014-10-17 DIAGNOSIS — I10 Essential (primary) hypertension: Secondary | ICD-10-CM | POA: Diagnosis not present

## 2014-10-17 DIAGNOSIS — E785 Hyperlipidemia, unspecified: Secondary | ICD-10-CM | POA: Diagnosis not present

## 2014-10-17 DIAGNOSIS — Z87891 Personal history of nicotine dependence: Secondary | ICD-10-CM | POA: Diagnosis not present

## 2014-10-17 DIAGNOSIS — C61 Malignant neoplasm of prostate: Secondary | ICD-10-CM | POA: Diagnosis not present

## 2014-10-17 DIAGNOSIS — Z7982 Long term (current) use of aspirin: Secondary | ICD-10-CM | POA: Diagnosis not present

## 2014-10-17 DIAGNOSIS — N401 Enlarged prostate with lower urinary tract symptoms: Secondary | ICD-10-CM | POA: Diagnosis not present

## 2014-10-17 DIAGNOSIS — N529 Male erectile dysfunction, unspecified: Secondary | ICD-10-CM | POA: Diagnosis not present

## 2014-10-20 ENCOUNTER — Ambulatory Visit
Admission: RE | Admit: 2014-10-20 | Discharge: 2014-10-20 | Disposition: A | Discharge status: Home / Self Care | Payer: Medicare Other | Source: Ambulatory Visit | Attending: Radiation Oncology | Admitting: Radiation Oncology

## 2014-10-20 ENCOUNTER — Encounter: Payer: Self-pay | Admitting: Radiation Oncology

## 2014-10-20 VITALS — BP 147/78 | HR 62 | Temp 97.4°F | Ht 69.0 in | Wt 237.0 lb

## 2014-10-20 DIAGNOSIS — Z7982 Long term (current) use of aspirin: Secondary | ICD-10-CM | POA: Diagnosis not present

## 2014-10-20 DIAGNOSIS — N529 Male erectile dysfunction, unspecified: Secondary | ICD-10-CM | POA: Diagnosis not present

## 2014-10-20 DIAGNOSIS — I10 Essential (primary) hypertension: Secondary | ICD-10-CM | POA: Diagnosis not present

## 2014-10-20 DIAGNOSIS — N401 Enlarged prostate with lower urinary tract symptoms: Secondary | ICD-10-CM | POA: Diagnosis not present

## 2014-10-20 DIAGNOSIS — C61 Malignant neoplasm of prostate: Secondary | ICD-10-CM | POA: Diagnosis not present

## 2014-10-20 DIAGNOSIS — E785 Hyperlipidemia, unspecified: Secondary | ICD-10-CM | POA: Diagnosis not present

## 2014-10-20 DIAGNOSIS — Z87891 Personal history of nicotine dependence: Secondary | ICD-10-CM | POA: Diagnosis not present

## 2014-10-20 NOTE — Progress Notes (Signed)
   Weekly Management Note:  outpatient    ICD-9-CM ICD-10-CM   1. Prostate cancer 185 C61     Current Dose:  74.1 Gy  Projected Dose: 78 Gy   Narrative:  The patient presents for routine under treatment assessment.  CBCT/MVCT images/Port film x-rays were reviewed.  The chart was checked.  Doing well. No complaints  Physical Findings:  height is 5\' 9"  (1.753 m) and weight is 237 lb (107.502 kg). His temperature is 97.4 F (36.3 C). His blood pressure is 147/78 and his pulse is 62.   Wt Readings from Last 3 Encounters:  10/20/14 237 lb (107.502 kg)  10/13/14 234 lb 3.2 oz (106.232 kg)  10/12/14 234 lb (106.142 kg)   NAD  Impression:  The patient is tolerating radiotherapy.  Plan:  Continue radiotherapy as planned. 1 mo f/u card given   ________________________________   Eppie Gibson, M.D.

## 2014-10-20 NOTE — Progress Notes (Signed)
Jeffrey Carter has received 38 fractions to his pelvis for prostate cancer. He has urinary frequency with complete emptying, nocturia 1-2. Denies any pain upon urination, diarrhea, rectal irritation, nor changes in bowel pattern.

## 2014-10-21 ENCOUNTER — Ambulatory Visit
Admission: RE | Admit: 2014-10-21 | Discharge: 2014-10-21 | Disposition: A | Discharge status: Home / Self Care | Payer: Medicare Other | Source: Ambulatory Visit | Attending: Radiation Oncology | Admitting: Radiation Oncology

## 2014-10-21 DIAGNOSIS — N401 Enlarged prostate with lower urinary tract symptoms: Secondary | ICD-10-CM | POA: Diagnosis not present

## 2014-10-21 DIAGNOSIS — Z7982 Long term (current) use of aspirin: Secondary | ICD-10-CM | POA: Diagnosis not present

## 2014-10-21 DIAGNOSIS — C61 Malignant neoplasm of prostate: Secondary | ICD-10-CM | POA: Diagnosis not present

## 2014-10-21 DIAGNOSIS — I10 Essential (primary) hypertension: Secondary | ICD-10-CM | POA: Diagnosis not present

## 2014-10-21 DIAGNOSIS — N529 Male erectile dysfunction, unspecified: Secondary | ICD-10-CM | POA: Diagnosis not present

## 2014-10-21 DIAGNOSIS — Z87891 Personal history of nicotine dependence: Secondary | ICD-10-CM | POA: Diagnosis not present

## 2014-10-21 DIAGNOSIS — E785 Hyperlipidemia, unspecified: Secondary | ICD-10-CM | POA: Diagnosis not present

## 2014-10-22 ENCOUNTER — Ambulatory Visit
Admission: RE | Admit: 2014-10-22 | Discharge: 2014-10-22 | Disposition: A | Discharge status: Home / Self Care | Payer: Medicare Other | Source: Ambulatory Visit | Attending: Radiation Oncology | Admitting: Radiation Oncology

## 2014-10-22 ENCOUNTER — Encounter: Payer: Self-pay | Admitting: Radiation Oncology

## 2014-10-22 DIAGNOSIS — C61 Malignant neoplasm of prostate: Secondary | ICD-10-CM | POA: Diagnosis not present

## 2014-10-22 DIAGNOSIS — Z7982 Long term (current) use of aspirin: Secondary | ICD-10-CM | POA: Diagnosis not present

## 2014-10-22 DIAGNOSIS — E785 Hyperlipidemia, unspecified: Secondary | ICD-10-CM | POA: Diagnosis not present

## 2014-10-22 DIAGNOSIS — N401 Enlarged prostate with lower urinary tract symptoms: Secondary | ICD-10-CM | POA: Diagnosis not present

## 2014-10-22 DIAGNOSIS — N529 Male erectile dysfunction, unspecified: Secondary | ICD-10-CM | POA: Diagnosis not present

## 2014-10-22 DIAGNOSIS — I10 Essential (primary) hypertension: Secondary | ICD-10-CM | POA: Diagnosis not present

## 2014-10-22 DIAGNOSIS — Z87891 Personal history of nicotine dependence: Secondary | ICD-10-CM | POA: Diagnosis not present

## 2014-10-22 NOTE — Progress Notes (Signed)
[facility] End of Treatment Note  Name:Jeffrey Carter  Date: 10/22/2014 UGQ:[id number] DOB:Jan 16, 1941   Status:outpatient    CC: [name], MD  Dr. Lowella Bandy  REFERRING PHYSICIAN: Dr. 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 tolerated treatment well although he did have an episode of gross hematuria for which she visited the emergency room.  His urine subsequently cleared.  No significant GI toxicity.                           PLAN: Routine followup in one month. Patient instructed to call if questions or worsening complaints in interim.

## 2014-11-10 ENCOUNTER — Other Ambulatory Visit: Payer: Self-pay | Admitting: Internal Medicine

## 2014-11-13 ENCOUNTER — Ambulatory Visit (INDEPENDENT_AMBULATORY_CARE_PROVIDER_SITE_OTHER): Payer: Medicare Other | Admitting: Internal Medicine

## 2014-11-13 ENCOUNTER — Encounter: Payer: Self-pay | Admitting: Internal Medicine

## 2014-11-13 VITALS — BP 128/82 | HR 79 | Wt 233.0 lb

## 2014-11-13 DIAGNOSIS — I10 Essential (primary) hypertension: Secondary | ICD-10-CM | POA: Diagnosis not present

## 2014-11-13 DIAGNOSIS — R350 Frequency of micturition: Secondary | ICD-10-CM

## 2014-11-13 DIAGNOSIS — H6123 Impacted cerumen, bilateral: Secondary | ICD-10-CM

## 2014-11-13 DIAGNOSIS — C61 Malignant neoplasm of prostate: Secondary | ICD-10-CM

## 2014-11-13 MED ORDER — LORATADINE 10 MG PO TABS: 10.0000 mg | ORAL_TABLET | Freq: Every day | ORAL | Status: DC

## 2014-11-13 NOTE — Assessment & Plan Note (Signed)
Better  

## 2014-11-13 NOTE — Progress Notes (Signed)
Pre visit review using our clinic review tool, if applicable. No additional management support is needed unless otherwise documented below in the visit note. 

## 2014-11-13 NOTE — Progress Notes (Signed)
Subjective:  Patient ID: Jeffrey Carter, male    DOB: January 10, 1941  Age: 74 y.o. MRN: [id number]  CC: No chief complaint on file.   HPI Jeffrey Carter presents for HTN, dyslipidemia, prostate Ca f/u - s/p XRT, Lupron. C/o nasal congestion and ear ringing x 2 weeks  Outpatient Prescriptions Prior to Visit  Medication Sig Dispense Refill  . amLODipine (NORVASC) 5 MG tablet Take 1 tablet (5 mg total) by mouth daily. 30 tablet 5  . aspirin 81 MG tablet Take 81 mg by mouth daily.      . cloNIDine (CATAPRES) 0.2 MG tablet Take 1 tablet (0.2 mg total) by mouth 2 (two) times daily. 60 tablet 3  . finasteride (PROSCAR) 5 MG tablet TAKE ONE TABLET BY MOUTH ONCE DAILY 30 tablet 11  . furosemide (LASIX) 40 MG tablet TAKE ONE TABLET BY MOUTH ONCE DAILY PRN swelling 30 tablet 5  . losartan (COZAAR) 100 MG tablet Take 1 tablet (100 mg total) by mouth daily. (Patient taking differently: Take 100 mg by mouth every morning. ) 90 tablet 3  . lovastatin (MEVACOR) 40 MG tablet TAKE ONE TABLET BY MOUTH ONCE DAILY AT  6  PM 30 tablet 5  . oxybutynin (DITROPAN) 5 MG tablet Take 1 tablet (5 mg total) by mouth every 8 (eight) hours as needed for bladder spasms. 90 tablet 3  . oxymetazoline (AFRIN NASAL SPRAY) 0.05 % nasal spray Place 2 sprays into the nose 2 (two) times daily. (Patient taking differently: Place 2 sprays into the nose 2 (two) times daily as needed. ) 30 mL 0  . tamsulosin (FLOMAX) 0.4 MG CAPS capsule Take 1 capsule (0.4 mg total) by mouth daily. 30 capsule 3   No facility-administered medications prior to visit.    ROS Review of Systems  Constitutional: Negative for appetite change, fatigue and unexpected weight change.  HENT: Positive for congestion, sinus pressure and tinnitus. Negative for nosebleeds, sneezing, sore throat and trouble swallowing.   Eyes: Negative for itching and visual disturbance.  Respiratory: Negative for cough.   Cardiovascular: Negative for chest pain, palpitations  and leg swelling.  Gastrointestinal: Negative for nausea, diarrhea, blood in stool and abdominal distention.  Genitourinary: Negative for frequency and hematuria.  Musculoskeletal: Negative for back pain, joint swelling, gait problem and neck pain.  Skin: Negative for rash.  Neurological: Negative for dizziness, tremors, speech difficulty and weakness.  Psychiatric/Behavioral: Negative for sleep disturbance, dysphoric mood and agitation. The patient is not nervous/anxious.     Objective:  BP 128/82 mmHg  Pulse 79  Wt 233 lb (105.688 kg)  SpO2 96%  BP Readings from Last 3 Encounters:  11/13/14 128/82  10/20/14 147/78  10/13/14 161/76    Wt Readings from Last 3 Encounters:  11/13/14 233 lb (105.688 kg)  10/20/14 237 lb (107.502 kg)  10/13/14 234 lb 3.2 oz (106.232 kg)    Physical Exam  Constitutional: He is oriented to person, place, and time. He appears well-developed. No distress.  NAD  HENT:  Mouth/Throat: Oropharynx is clear and moist.  Eyes: Conjunctivae are normal. Pupils are equal, round, and reactive to light.  Neck: Normal range of motion. No JVD present. No thyromegaly present.  Cardiovascular: Normal rate, regular rhythm, normal heart sounds and intact distal pulses.  Exam reveals no gallop and no friction rub.   No murmur heard. Pulmonary/Chest: Effort normal and breath sounds normal. No respiratory distress. He has no wheezes. He has no rales. He exhibits no tenderness.  Abdominal:  Soft. Bowel sounds are normal. He exhibits no distension and no mass. There is no tenderness. There is no rebound and no guarding.  Musculoskeletal: Normal range of motion. He exhibits no edema or tenderness.  Lymphadenopathy:    He has no cervical adenopathy.  Neurological: He is alert and oriented to person, place, and time. He has normal reflexes. No cranial nerve deficit. He exhibits normal muscle tone. He displays a negative Romberg sign. Coordination and gait normal.  Skin: Skin  is warm and dry. No rash noted.  Psychiatric: He has a normal mood and affect. His behavior is normal. Judgment and thought content normal.  wax B  Lab Results  Component Value Date   WBC 3.0* 10/12/2014   HGB 11.6* 10/12/2014   HCT 34.3* 10/12/2014   PLT 165 10/12/2014   GLUCOSE 124* 10/12/2014   CHOL 154 02/20/2013   TRIG 121.0 02/20/2013   HDL 48.60 02/20/2013   LDLCALC 81 02/20/2013   ALT 26 10/12/2014   AST 22 10/12/2014   NA 142 10/12/2014   K 3.5 10/12/2014   CL 109 10/12/2014   CREATININE 1.00 10/12/2014   BUN 18 10/12/2014   CO2 26 10/12/2014   TSH 0.93 04/03/2013   PSA 5.25* 12/11/2013   INR 1.01 10/12/2014    Procedure Note :     Procedure :  Ear irrigation   Indication:  Cerumen impaction B   Risks, including pain, dizziness, eardrum perforation, bleeding, infection and others as well as benefits were explained to the patient in detail. Verbal consent was obtained and the patient agreed to proceed.    We used "The Elephant Ear Irrigation Device" filled with lukewarm water for irrigation. A large amount wax was recovered. Procedure has also required manual wax removal with an ear loop.   Tolerated well. Complications: None.   Postprocedure instructions :  Call if problems.   No results found.  Assessment & Plan:   There are no diagnoses linked to this encounter. I am having Jeffrey Carter maintain his aspirin, oxymetazoline, losartan, cloNIDine, finasteride, furosemide, oxybutynin, tamsulosin, amLODipine, and lovastatin.  No orders of the defined types were placed in this encounter.     Follow-up: No Follow-up on file.  [name], MD

## 2014-11-13 NOTE — Assessment & Plan Note (Signed)
Will irrigate 

## 2014-11-13 NOTE — Assessment & Plan Note (Signed)
Amlodipine, Catapress, Furosemide, Losartan °

## 2014-11-13 NOTE — Assessment & Plan Note (Signed)
F/u w/Dr Valere Dross - s/p XRT

## 2014-11-24 DIAGNOSIS — G4733 Obstructive sleep apnea (adult) (pediatric): Secondary | ICD-10-CM | POA: Diagnosis not present

## 2014-11-25 ENCOUNTER — Encounter: Payer: Self-pay | Admitting: Radiation Oncology

## 2014-11-25 ENCOUNTER — Ambulatory Visit
Admission: RE | Admit: 2014-11-25 | Discharge: 2014-11-25 | Disposition: A | Discharge status: Home / Self Care | Payer: Medicare Other | Source: Ambulatory Visit | Attending: Radiation Oncology | Admitting: Radiation Oncology

## 2014-11-25 VITALS — BP 153/83 | HR 66 | Temp 98.3°F | Resp 20 | Ht 69.0 in | Wt 233.9 lb

## 2014-11-25 DIAGNOSIS — C61 Malignant neoplasm of prostate: Secondary | ICD-10-CM

## 2014-11-25 NOTE — Progress Notes (Signed)
CC: Dr. [name], Dr. Lowella Bandy ([facility])   Follow-up note:   Mr. Schroepfer returns today approximately 1 month following completion of external beam/IMRT along with short-term androgen deprivation therapy in the management of his stage T1c intermediate to high risk adenocarcinoma prostate.  He is doing well from a GU and GI standpoint.  He is not had any further hematuria.  He tells me he does have a follow-up visit with [facility] but he is not sure of his appointment date. He feels well.    Physical examination: Filed Vitals:   11/25/14 1140  BP: 153/83  Pulse: 66  Temp: 98.3 F (36.8 C)  Resp: 20    Rectal examination not performed today.   Impression: Satisfactory progress. He'll maintain his follow-up through [facility]. I've not scheduled Mr. Stock for a formal follow-up visit  I ask that we be kept posted on his progress/PSA determinations.   Plan: As above.

## 2014-11-25 NOTE — Progress Notes (Signed)
PAIN: He is currently in no pain. N/A over No pain when voiding. URINARY: Pt reports urinary frequency, urgency and hot flashes. Pt states they urinate 1 - 2 times per night.  BOWEL: Pt reports a soft bowel movement everyday/everyother day. OTHER: Pt complains of No voiced concerns BP 153/83 mmHg  Pulse 66  Temp(Src) 98.3 F (36.8 C)  Resp 20  Ht 5\' 9"  (1.753 m)  Wt 233 lb 14.4 oz (106.096 kg)  BMI 34.53 kg/m2 Wt Readings from Last 3 Encounters:  11/25/14 233 lb 14.4 oz (106.096 kg)  11/13/14 233 lb (105.688 kg)  10/20/14 237 lb (107.502 kg)

## 2014-12-05 ENCOUNTER — Ambulatory Visit: Payer: Medicare Other | Admitting: Pulmonary Disease

## 2014-12-10 ENCOUNTER — Other Ambulatory Visit: Payer: Self-pay | Admitting: Internal Medicine

## 2014-12-21 ENCOUNTER — Other Ambulatory Visit: Payer: Self-pay | Admitting: Internal Medicine

## 2014-12-24 DIAGNOSIS — G4733 Obstructive sleep apnea (adult) (pediatric): Secondary | ICD-10-CM | POA: Diagnosis not present

## 2014-12-25 DIAGNOSIS — Z87898 Personal history of other specified conditions: Secondary | ICD-10-CM

## 2014-12-25 HISTORY — DX: Personal history of other specified conditions: Z87.898

## 2014-12-31 ENCOUNTER — Ambulatory Visit (INDEPENDENT_AMBULATORY_CARE_PROVIDER_SITE_OTHER): Payer: Medicare Other | Admitting: Internal Medicine

## 2014-12-31 ENCOUNTER — Encounter: Payer: Self-pay | Admitting: Internal Medicine

## 2014-12-31 VITALS — BP 130/90 | HR 78 | Temp 98.2°F | Wt 237.0 lb

## 2014-12-31 DIAGNOSIS — J0100 Acute maxillary sinusitis, unspecified: Secondary | ICD-10-CM

## 2014-12-31 DIAGNOSIS — J019 Acute sinusitis, unspecified: Secondary | ICD-10-CM | POA: Insufficient documentation

## 2014-12-31 MED ORDER — AMOXICILLIN-POT CLAVULANATE 875-125 MG PO TABS: 1.0000 | ORAL_TABLET | Freq: Two times a day (BID) | ORAL | Status: DC

## 2014-12-31 NOTE — Progress Notes (Signed)
Pre visit review using our clinic review tool, if applicable. No additional management support is needed unless otherwise documented below in the visit note. 

## 2014-12-31 NOTE — Progress Notes (Signed)
Subjective:  Patient ID: Jeffrey Carter, male    DOB: 09/25/1940  Age: 74 y.o. MRN: [id number]  CC: No chief complaint on file.   HPI Jeffrey Carter presents for sinusitis sx's x 2 weeks, yellow mucus  Outpatient Prescriptions Prior to Visit  Medication Sig Dispense Refill  . amLODipine (NORVASC) 5 MG tablet Take 1 tablet (5 mg total) by mouth daily. 30 tablet 5  . aspirin 81 MG tablet Take 81 mg by mouth daily.      . cloNIDine (CATAPRES) 0.2 MG tablet TAKE ONE TABLET BY MOUTH TWICE DAILY 60 tablet 11  . furosemide (LASIX) 40 MG tablet TAKE ONE TABLET BY MOUTH ONCE DAILY PRN swelling 30 tablet 5  . loratadine (CLARITIN) 10 MG tablet Take 1 tablet (10 mg total) by mouth daily. 100 tablet 3  . losartan (COZAAR) 100 MG tablet TAKE ONE TABLET BY MOUTH DAILY 90 tablet 0  . lovastatin (MEVACOR) 40 MG tablet TAKE ONE TABLET BY MOUTH ONCE DAILY AT  6  PM 30 tablet 5  . oxybutynin (DITROPAN) 5 MG tablet Take 1 tablet (5 mg total) by mouth every 8 (eight) hours as needed for bladder spasms. 90 tablet 3  . oxymetazoline (AFRIN NASAL SPRAY) 0.05 % nasal spray Place 2 sprays into the nose 2 (two) times daily. (Patient taking differently: Place 2 sprays into the nose 2 (two) times daily as needed. ) 30 mL 0  . tamsulosin (FLOMAX) 0.4 MG CAPS capsule Take 1 capsule (0.4 mg total) by mouth daily. 30 capsule 3   No facility-administered medications prior to visit.    ROS Review of Systems  Constitutional: Negative for appetite change, fatigue and unexpected weight change.  HENT: Positive for congestion, nosebleeds, postnasal drip, rhinorrhea and sinus pressure. Negative for dental problem, sneezing, sore throat and trouble swallowing.   Eyes: Negative for itching and visual disturbance.  Respiratory: Negative for cough.   Cardiovascular: Negative for chest pain, palpitations and leg swelling.  Gastrointestinal: Negative for nausea, diarrhea, blood in stool and abdominal distention.    Genitourinary: Negative for frequency and hematuria.  Musculoskeletal: Negative for back pain, joint swelling, gait problem and neck pain.  Skin: Negative for rash.  Neurological: Negative for dizziness, tremors, speech difficulty and weakness.  Psychiatric/Behavioral: Negative for sleep disturbance, dysphoric mood and agitation. The patient is not nervous/anxious.     Objective:  BP 130/90 mmHg  Pulse 78  Temp(Src) 98.2 F (36.8 C) (Oral)  Wt 237 lb (107.502 kg)  SpO2 97%  BP Readings from Last 3 Encounters:  12/31/14 130/90  11/25/14 153/83  11/13/14 128/82    Wt Readings from Last 3 Encounters:  12/31/14 237 lb (107.502 kg)  11/25/14 233 lb 14.4 oz (106.096 kg)  11/13/14 233 lb (105.688 kg)    Physical Exam  Constitutional: He is oriented to person, place, and time. He appears well-developed. No distress.  NAD  HENT:  Mouth/Throat: Oropharynx is clear and moist.  Eyes: Conjunctivae are normal. Pupils are equal, round, and reactive to light.  Neck: Normal range of motion. No JVD present. No thyromegaly present.  Cardiovascular: Normal rate, regular rhythm, normal heart sounds and intact distal pulses.  Exam reveals no gallop and no friction rub.   No murmur heard. Pulmonary/Chest: Effort normal and breath sounds normal. No respiratory distress. He has no wheezes. He has no rales. He exhibits no tenderness.  Abdominal: Soft. Bowel sounds are normal. He exhibits no distension and no mass. There is no tenderness.  There is no rebound and no guarding.  Musculoskeletal: Normal range of motion. He exhibits no edema or tenderness.  Lymphadenopathy:    He has no cervical adenopathy.  Neurological: He is alert and oriented to person, place, and time. He has normal reflexes. No cranial nerve deficit. He exhibits normal muscle tone. He displays a negative Romberg sign. Coordination and gait normal.  Skin: Skin is warm and dry. No rash noted.  Psychiatric: He has a normal mood and  affect. His behavior is normal. Judgment and thought content normal.  eryth nasal lining, green d/c  Lab Results  Component Value Date   WBC 3.0* 10/12/2014   HGB 11.6* 10/12/2014   HCT 34.3* 10/12/2014   PLT 165 10/12/2014   GLUCOSE 124* 10/12/2014   CHOL 154 02/20/2013   TRIG 121.0 02/20/2013   HDL 48.60 02/20/2013   LDLCALC 81 02/20/2013   ALT 26 10/12/2014   AST 22 10/12/2014   NA 142 10/12/2014   K 3.5 10/12/2014   CL 109 10/12/2014   CREATININE 1.00 10/12/2014   BUN 18 10/12/2014   CO2 26 10/12/2014   TSH 0.93 04/03/2013   PSA 5.25* 12/11/2013   INR 1.01 10/12/2014    No results found.  Assessment & Plan:   There are no diagnoses linked to this encounter. I am having Mr. Reppucci maintain his aspirin, oxymetazoline, furosemide, oxybutynin, tamsulosin, amLODipine, lovastatin, loratadine, cloNIDine, losartan, and finasteride.  Meds ordered this encounter  Medications  . finasteride (PROSCAR) 5 MG tablet    Sig: Take 1 tablet by mouth daily.     Follow-up: No Follow-up on file.  [name], MD

## 2014-12-31 NOTE — Assessment & Plan Note (Addendum)
Augmentin x 10 d Activia

## 2015-01-13 ENCOUNTER — Encounter: Payer: Self-pay | Admitting: Internal Medicine

## 2015-01-13 ENCOUNTER — Ambulatory Visit (INDEPENDENT_AMBULATORY_CARE_PROVIDER_SITE_OTHER): Payer: Medicare Other | Admitting: Internal Medicine

## 2015-01-13 VITALS — BP 158/90 | HR 71 | Temp 97.7°F | Wt 235.0 lb

## 2015-01-13 DIAGNOSIS — J0100 Acute maxillary sinusitis, unspecified: Secondary | ICD-10-CM

## 2015-01-13 MED ORDER — LEVOFLOXACIN 500 MG PO TABS: 500.0000 mg | ORAL_TABLET | Freq: Every day | ORAL | Status: DC

## 2015-01-13 NOTE — Assessment & Plan Note (Addendum)
Refractory. Augmentin did not help Levaquin x 10 d Mucinex DM

## 2015-01-13 NOTE — Progress Notes (Signed)
Subjective:  Patient ID: Jeffrey Carter, male    DOB: 03-25-1940  Age: 74 y.o. MRN: [id number]  CC: No chief complaint on file.   HPI Jeffrey Carter presents for sinus congestion and yellow d/c -  Augmentin did not help  Outpatient Prescriptions Prior to Visit  Medication Sig Dispense Refill  . amLODipine (NORVASC) 5 MG tablet Take 1 tablet (5 mg total) by mouth daily. 30 tablet 5  . aspirin 81 MG tablet Take 81 mg by mouth daily.      . cloNIDine (CATAPRES) 0.2 MG tablet TAKE ONE TABLET BY MOUTH TWICE DAILY 60 tablet 11  . finasteride (PROSCAR) 5 MG tablet Take 1 tablet by mouth daily.    . furosemide (LASIX) 40 MG tablet TAKE ONE TABLET BY MOUTH ONCE DAILY PRN swelling 30 tablet 5  . loratadine (CLARITIN) 10 MG tablet Take 1 tablet (10 mg total) by mouth daily. 100 tablet 3  . losartan (COZAAR) 100 MG tablet TAKE ONE TABLET BY MOUTH DAILY 90 tablet 0  . lovastatin (MEVACOR) 40 MG tablet TAKE ONE TABLET BY MOUTH ONCE DAILY AT  6  PM 30 tablet 5  . oxybutynin (DITROPAN) 5 MG tablet Take 1 tablet (5 mg total) by mouth every 8 (eight) hours as needed for bladder spasms. 90 tablet 3  . oxymetazoline (AFRIN NASAL SPRAY) 0.05 % nasal spray Place 2 sprays into the nose 2 (two) times daily. (Patient taking differently: Place 2 sprays into the nose 2 (two) times daily as needed. ) 30 mL 0  . tamsulosin (FLOMAX) 0.4 MG CAPS capsule Take 1 capsule (0.4 mg total) by mouth daily. 30 capsule 3  . amoxicillin-clavulanate (AUGMENTIN) 875-125 MG tablet Take 1 tablet by mouth 2 (two) times daily. (Patient not taking: Reported on 01/13/2015) 20 tablet 0   No facility-administered medications prior to visit.    ROS Review of Systems  Constitutional: Negative for appetite change, fatigue and unexpected weight change.  HENT: Positive for congestion, postnasal drip, rhinorrhea and sinus pressure. Negative for nosebleeds, sneezing, sore throat and trouble swallowing.   Eyes: Negative for itching and  visual disturbance.  Respiratory: Negative for cough and shortness of breath.   Cardiovascular: Negative for chest pain, palpitations and leg swelling.  Gastrointestinal: Negative for nausea, diarrhea, blood in stool and abdominal distention.  Genitourinary: Negative for frequency and hematuria.  Musculoskeletal: Negative for back pain, joint swelling, gait problem and neck pain.  Skin: Negative for rash.  Neurological: Negative for dizziness, tremors, speech difficulty and weakness.  Psychiatric/Behavioral: Negative for sleep disturbance, dysphoric mood and agitation. The patient is not nervous/anxious.     Objective:  BP 158/90 mmHg  Pulse 71  Temp(Src) 97.7 F (36.5 C) (Oral)  Wt 235 lb (106.595 kg)  SpO2 98%  BP Readings from Last 3 Encounters:  01/13/15 158/90  12/31/14 130/90  11/25/14 153/83    Wt Readings from Last 3 Encounters:  01/13/15 235 lb (106.595 kg)  12/31/14 237 lb (107.502 kg)  11/25/14 233 lb 14.4 oz (106.096 kg)    Physical Exam  Constitutional: He is oriented to person, place, and time. He appears well-developed. No distress.  NAD  HENT:  Mouth/Throat: Oropharynx is clear and moist.  Eyes: Conjunctivae are normal. Pupils are equal, round, and reactive to light.  Neck: Normal range of motion. No JVD present. No thyromegaly present.  Cardiovascular: Normal rate, regular rhythm, normal heart sounds and intact distal pulses.  Exam reveals no gallop and no friction rub.  No murmur heard. Pulmonary/Chest: Effort normal and breath sounds normal. No respiratory distress. He has no wheezes. He has no rales. He exhibits no tenderness.  Abdominal: Soft. Bowel sounds are normal. He exhibits no distension and no mass. There is no tenderness. There is no rebound and no guarding.  Musculoskeletal: Normal range of motion. He exhibits no edema or tenderness.  Lymphadenopathy:    He has no cervical adenopathy.  Neurological: He is alert and oriented to person, place,  and time. He has normal reflexes. No cranial nerve deficit. He exhibits normal muscle tone. He displays a negative Romberg sign. Coordination and gait normal.  Skin: Skin is warm and dry. No rash noted.  Psychiatric: He has a normal mood and affect. His behavior is normal. Judgment and thought content normal.  swollen nasal mucosa  Lab Results  Component Value Date   WBC 3.0* 10/12/2014   HGB 11.6* 10/12/2014   HCT 34.3* 10/12/2014   PLT 165 10/12/2014   GLUCOSE 124* 10/12/2014   CHOL 154 02/20/2013   TRIG 121.0 02/20/2013   HDL 48.60 02/20/2013   LDLCALC 81 02/20/2013   ALT 26 10/12/2014   AST 22 10/12/2014   NA 142 10/12/2014   K 3.5 10/12/2014   CL 109 10/12/2014   CREATININE 1.00 10/12/2014   BUN 18 10/12/2014   CO2 26 10/12/2014   TSH 0.93 04/03/2013   PSA 5.25* 12/11/2013   INR 1.01 10/12/2014    No results found.  Assessment & Plan:   Diagnoses and all orders for this visit:  Acute maxillary sinusitis, recurrence not specified  Other orders -     levofloxacin (LEVAQUIN) 500 MG tablet; Take 1 tablet (500 mg total) by mouth daily.   I have discontinued Mr. Stenglein amoxicillin-clavulanate. I am also having him start on levofloxacin. Additionally, I am having him maintain his aspirin, oxymetazoline, furosemide, oxybutynin, tamsulosin, amLODipine, lovastatin, loratadine, cloNIDine, losartan, and finasteride.  Meds ordered this encounter  Medications  . levofloxacin (LEVAQUIN) 500 MG tablet    Sig: Take 1 tablet (500 mg total) by mouth daily.    Dispense:  14 tablet    Refill:  0     Follow-up: No Follow-up on file.  [name], MD

## 2015-01-13 NOTE — Progress Notes (Signed)
Pre visit review using our clinic review tool, if applicable. No additional management support is needed unless otherwise documented below in the visit note. 

## 2015-01-17 ENCOUNTER — Emergency Department (HOSPITAL_COMMUNITY)
Admission: EM | Admit: 2015-01-17 | Discharge: 2015-01-17 | Disposition: A | Discharge status: Home / Self Care | Payer: Medicare Other | Attending: Emergency Medicine | Admitting: Emergency Medicine

## 2015-01-17 ENCOUNTER — Encounter (HOSPITAL_COMMUNITY): Payer: Self-pay | Admitting: Emergency Medicine

## 2015-01-17 DIAGNOSIS — R350 Frequency of micturition: Secondary | ICD-10-CM | POA: Diagnosis present

## 2015-01-17 DIAGNOSIS — Z9889 Other specified postprocedural states: Secondary | ICD-10-CM | POA: Diagnosis not present

## 2015-01-17 DIAGNOSIS — Z87891 Personal history of nicotine dependence: Secondary | ICD-10-CM | POA: Insufficient documentation

## 2015-01-17 DIAGNOSIS — M1990 Unspecified osteoarthritis, unspecified site: Secondary | ICD-10-CM | POA: Insufficient documentation

## 2015-01-17 DIAGNOSIS — Z8639 Personal history of other endocrine, nutritional and metabolic disease: Secondary | ICD-10-CM | POA: Insufficient documentation

## 2015-01-17 DIAGNOSIS — R300 Dysuria: Secondary | ICD-10-CM | POA: Insufficient documentation

## 2015-01-17 DIAGNOSIS — Z79899 Other long term (current) drug therapy: Secondary | ICD-10-CM | POA: Diagnosis not present

## 2015-01-17 DIAGNOSIS — R339 Retention of urine, unspecified: Secondary | ICD-10-CM | POA: Diagnosis not present

## 2015-01-17 DIAGNOSIS — N400 Enlarged prostate without lower urinary tract symptoms: Secondary | ICD-10-CM | POA: Diagnosis not present

## 2015-01-17 DIAGNOSIS — R102 Pelvic and perineal pain: Secondary | ICD-10-CM | POA: Diagnosis not present

## 2015-01-17 DIAGNOSIS — Z9049 Acquired absence of other specified parts of digestive tract: Secondary | ICD-10-CM | POA: Insufficient documentation

## 2015-01-17 DIAGNOSIS — I10 Essential (primary) hypertension: Secondary | ICD-10-CM | POA: Diagnosis not present

## 2015-01-17 DIAGNOSIS — Z9981 Dependence on supplemental oxygen: Secondary | ICD-10-CM | POA: Diagnosis not present

## 2015-01-17 DIAGNOSIS — G4733 Obstructive sleep apnea (adult) (pediatric): Secondary | ICD-10-CM | POA: Insufficient documentation

## 2015-01-17 DIAGNOSIS — E785 Hyperlipidemia, unspecified: Secondary | ICD-10-CM | POA: Diagnosis not present

## 2015-01-17 DIAGNOSIS — Z7982 Long term (current) use of aspirin: Secondary | ICD-10-CM | POA: Diagnosis not present

## 2015-01-17 DIAGNOSIS — Z8546 Personal history of malignant neoplasm of prostate: Secondary | ICD-10-CM | POA: Diagnosis not present

## 2015-01-17 DIAGNOSIS — Z792 Long term (current) use of antibiotics: Secondary | ICD-10-CM | POA: Insufficient documentation

## 2015-01-17 DIAGNOSIS — Z973 Presence of spectacles and contact lenses: Secondary | ICD-10-CM | POA: Insufficient documentation

## 2015-01-17 DIAGNOSIS — R1030 Lower abdominal pain, unspecified: Secondary | ICD-10-CM | POA: Insufficient documentation

## 2015-01-17 LAB — BASIC METABOLIC PANEL
ANION GAP: 10 (ref 5–15)
BUN: 11 mg/dL (ref 6–20)
CALCIUM: 9.6 mg/dL (ref 8.9–10.3)
CO2: 24 mmol/L (ref 22–32)
Chloride: 106 mmol/L (ref 101–111)
Creatinine, Ser: 1.01 mg/dL (ref 0.61–1.24)
Glucose, Bld: 143 mg/dL — ABNORMAL HIGH (ref 65–99)
POTASSIUM: 3.1 mmol/L — AB (ref 3.5–5.1)
Sodium: 140 mmol/L (ref 135–145)

## 2015-01-17 LAB — CBC WITH DIFFERENTIAL/PLATELET
BASOS ABS: 0 10*3/uL (ref 0.0–0.1)
BASOS PCT: 0 %
EOS PCT: 6 %
Eosinophils Absolute: 0.3 10*3/uL (ref 0.0–0.7)
HCT: 35.5 % — ABNORMAL LOW (ref 39.0–52.0)
Hemoglobin: 11.8 g/dL — ABNORMAL LOW (ref 13.0–17.0)
LYMPHS PCT: 22 %
Lymphs Abs: 1.2 10*3/uL (ref 0.7–4.0)
MCH: 27.3 pg (ref 26.0–34.0)
MCHC: 33.2 g/dL (ref 30.0–36.0)
MCV: 82 fL (ref 78.0–100.0)
MONO ABS: 0.5 10*3/uL (ref 0.1–1.0)
Monocytes Relative: 9 %
NEUTROS ABS: 3.4 10*3/uL (ref 1.7–7.7)
Neutrophils Relative %: 63 %
PLATELETS: 184 10*3/uL (ref 150–400)
RBC: 4.33 MIL/uL (ref 4.22–5.81)
RDW: 13.6 % (ref 11.5–15.5)
WBC: 5.4 10*3/uL (ref 4.0–10.5)

## 2015-01-17 LAB — URINALYSIS, ROUTINE W REFLEX MICROSCOPIC
Bilirubin Urine: NEGATIVE
GLUCOSE, UA: NEGATIVE mg/dL
HGB URINE DIPSTICK: NEGATIVE
KETONES UR: NEGATIVE mg/dL
LEUKOCYTES UA: NEGATIVE
Nitrite: NEGATIVE
PROTEIN: NEGATIVE mg/dL
Specific Gravity, Urine: 1.009 (ref 1.005–1.030)
pH: 6.5 (ref 5.0–8.0)

## 2015-01-17 MED ORDER — POTASSIUM CHLORIDE CRYS ER 20 MEQ PO TBCR
40.0000 meq | EXTENDED_RELEASE_TABLET | Freq: Once | ORAL | Status: AC
  Administered 2015-01-17: 40 meq via ORAL
  Filled 2015-01-17: qty 2

## 2015-01-17 NOTE — Discharge Instructions (Signed)
Keep your schedule appointment with [facility]. Return to ED if your catheter stops draining urine or with any other concerns.    Acute Urinary Retention, Male Acute urinary retention is the temporary inability to urinate. This is a common problem in older men. As men age their prostates become larger and block the flow of urine from the bladder. This is usually a problem that has come on gradually.  HOME CARE INSTRUCTIONS If you are sent home with a Foley catheter and a drainage system, you will need to discuss the best course of action with your health care provider. While the catheter is in, maintain a good intake of fluids. Keep the drainage bag emptied and lower than your catheter. This is so that contaminated urine will not flow back into your bladder, which could lead to a urinary tract infection. There are two main types of drainage bags. One is a large bag that usually is used at night. It has a good capacity that will allow you to sleep through the night without having to empty it. The second type is called a leg bag. It has a smaller capacity, so it needs to be emptied more frequently. However, the main advantage is that it can be attached by a leg strap and can go underneath your clothing, allowing you the freedom to move about or leave your home. Only take over-the-counter or prescription medicines for pain, discomfort, or fever as directed by your health care provider.  SEEK MEDICAL CARE IF:  You develop a low-grade fever.  You experience spasms or leakage of urine with the spasms. SEEK IMMEDIATE MEDICAL CARE IF:   You develop chills or fever.  Your catheter stops draining urine.  Your catheter falls out.  You start to develop increased bleeding that does not respond to rest and increased fluid intake. MAKE SURE YOU:  Understand these instructions.  Will watch your condition.  Will get help right away if you are not doing well or get worse.   This information is  not intended to replace advice given to you by your health care provider. Make sure you discuss any questions you have with your health care provider.   Document Released: 04/18/2000 Document Revised: 05/27/2014 Document Reviewed: 06/21/2012 Elsevier Interactive Patient Education 2016 [place], Adult  A Foley catheter is a soft, flexible tube that is placed into the bladder to drain urine. A Foley catheter may be inserted if:  You leak urine or are not able to control when you urinate (urinary incontinence).  You are not able to urinate when you need to (urinary retention).  You had prostate surgery or surgery on the genitals.  You have certain medical conditions, such as multiple sclerosis, dementia, or a spinal cord injury. If you are going home with a Foley catheter in place, follow the instructions below.  TAKING CARE OF THE CATHETER  1. Wash your hands with soap and water. 2. Using mild soap and warm water on a clean washcloth: Clean the area on your body closest to the catheter insertion site using a circular motion, moving away from the catheter. Never wipe toward the catheter because this could sweep bacteria up into the urethra and cause infection.  Remove all traces of soap. Pat the area dry with a clean towel. For males, reposition the foreskin. 3. Attach the catheter to your leg so there is no tension on the catheter. Use adhesive tape or a leg strap. If you are using  adhesive tape, remove any sticky residue left behind by the previous tape you used. 4. Keep the drainage bag below the level of the bladder, but keep it off the floor. 5. Check throughout the day to be sure the catheter is working and urine is draining freely. Make sure the tubing does not become kinked. 6. Do not pull on the catheter or try to remove it. Pulling could damage internal tissues. TAKING CARE OF THE DRAINAGE BAGS  You will be given two drainage bags to take home. One is a large  overnight drainage bag, and the other is a smaller leg bag that fits underneath clothing. You may wear the overnight bag at any time, but you should never wear the smaller leg bag at night. Follow the instructions below for how to empty, change, and clean your drainage bags.  Emptying the Drainage Bag  You must empty your drainage bag when it is - full or at least 2-3 times a day.  1. Wash your hands with soap and water. 2. Keep the drainage bag below your hips, below the level of your bladder. This stops urine from going back into the tubing and into your bladder. 3. Hold the dirty bag over the toilet or a clean container. 4. Open the pour spout at the bottom of the bag and empty the urine into the toilet or container. Do not let the pour spout touch the toilet, container, or any other surface. Doing so can place bacteria on the bag, which can cause an infection. 5. Clean the pour spout with a gauze pad or cotton ball that has rubbing alcohol on it. 6. Close the pour spout. 7. Attach the bag to your leg with adhesive tape or a leg strap. 8. Wash your hands well. Changing the Drainage Bag  Change your drainage bag once a month or sooner if it starts to smell bad or look dirty. Below are steps to follow when changing the drainage bag.  1. Wash your hands with soap and water. 2. Pinch off the rubber catheter so that urine does not spill out. 3. Disconnect the catheter tube from the drainage tube at the connection valve. Do not let the tubes touch any surface. 4. Clean the end of the catheter tube with an alcohol wipe. Use a different alcohol wipe to clean the end of the drainage tube. 5. Connect the catheter tube to the drainage tube of the clean drainage bag. 6. Attach the new bag to the leg with adhesive tape or a leg strap. Avoid attaching the new bag too tightly. 7. Wash your hands well. Cleaning the Drainage Bag  1. Wash your hands with soap and water. 2. Wash the bag in warm, soapy  water. 3. Rinse the bag thoroughly with warm water. 4. Fill the bag with a solution of white vinegar and water (1 cup vinegar to 1 qt warm water [.2 L vinegar to 1 L warm water]). Close the bag and soak it for 30 minutes in the solution. 5. Rinse the bag with warm water. 6. Hang the bag to dry with the pour spout open and hanging downward. 7. Store the clean bag (once it is dry) in a clean plastic bag. 8. Wash your hands well. PREVENTING INFECTION  Wash your hands before and after handling your catheter.  Take showers daily and wash the area where the catheter enters your body. Do not take baths. Replace wet leg straps with dry ones, if this applies.  Do not  use powders, sprays, or lotions on the genital area. Only use creams, lotions, or ointments as directed by your caregiver.  For females, wipe from front to back after each bowel movement.  Drink enough fluids to keep your urine clear or pale yellow unless you have a fluid restriction.  Do not let the drainage bag or tubing touch or lie on the floor.  Wear cotton underwear to absorb moisture and to keep your skin drier. SEEK MEDICAL CARE IF:  Your urine is cloudy or smells unusually bad.  Your catheter becomes clogged.  You are not draining urine into the bag or your bladder feels full.  Your catheter starts to leak. SEEK IMMEDIATE MEDICAL CARE IF:  You have pain, swelling, redness, or pus where the catheter enters the body.  You have pain in the abdomen, legs, lower back, or bladder.  You have a fever.  You see blood fill the catheter, or your urine is pink or red.  You have nausea, vomiting, or chills.  Your catheter gets pulled out. MAKE SURE YOU:  Understand these instructions.  Will watch your condition.  Will get help right away if you are not doing well or get worse. This information is not intended to replace advice given to you by your health care provider. Make sure you discuss any questions you have with your health care  provider.  Document Released: 01/10/2005 Document Revised: 05/27/2013 Document Reviewed: 01/02/2012  Elsevier Interactive Patient Education [employer].

## 2015-01-17 NOTE — ED Notes (Signed)
Pt states that he had a sudden onset of groin pain this morning at 0200. Pt states that he was told that his prostate cancer was resolved. Pt states that he is only able to urinate small amounts at a time, and states that he even if he does urinate, he still needs to go.

## 2015-01-17 NOTE — ED Provider Notes (Signed)
CSN: [id number]     Arrival date & time 01/17/15  [id number] History   First MD Initiated Contact with Patient 01/17/15 0600     Chief Complaint  Patient presents with  . Pelvic Pain  . Urinary Frequency   HPI  Mr. Jonassen is a 74 year old male with PMHx of prostate cancer presenting with lower abdominal pain and increased urinary frequency. Patient reports increased urinary frequency onset last evening. He reports using the restroom approximately every 10 minutes with urinary dribbling. He woke 4 hours PTA with severe suprapubic pain described as a burning pain. Denies exacerbating or alleviating factors. He attempted to take some pain medications he had at home without relief. He states that prior to last evening, he was able to produce a normal urinary stream without dribbling. Denies fevers, chills, headache, dizziness, syncope, chest pain, SOB, nausea, vomiting, diarrhea, constipation, dysuria or hematuria. He was last seen by his oncologist (Dr. Valere Dross) on 11/1 after completing radiation. He was released from his radiologist and instructed to follow up with his urologist. He has a urology appointment on 12/29 with [facility].   Past Medical History  Diagnosis Date  . Hypertension   . Osteoarthritis   . Hyperlipidemia   . BPH (benign prostatic hyperplasia)   . Lower urinary tract symptoms (LUTS)   . History of gout     BIG TOE  . OSA on CPAP     MODERATE PER STUDY 03-08-2012  . Wears glasses   . First degree heart block   . Prostate cancer [facility]) urologist-  dr nesi/  oncologist-  dr Ander Slade    Stage T1c,  Gleason 3+4,  PSA 10.57,  vol 132ml   Past Surgical History  Procedure Laterality Date  . Total knee arthroplasty Right 06-22-2007  . Colonoscopy  07-06-2006  . Tonsillectomy  as child  . Appendectomy  1966  . Prostate biopsy N/A 03/28/2014    Procedure: BIOPSY TRANSRECTAL ULTRASONIC PROSTATE (TUBP);  Surgeon: Arvil Persons, MD;  Location: [facility];  Service: Urology;   Laterality: N/A;  Girtha Rm seed implant N/A 08/12/2014    Procedure: GOLD SEED IMPLANT;  Surgeon: Lowella Bandy, MD;  Location: [facility];  Service: Urology;  Laterality: N/A;   Family History  Problem Relation Age of Onset  . Cancer Brother     pancreatic, stomach  . Diabetes Father    Social History  Substance Use Topics  . Smoking status: Former Smoker -- 0.25 packs/day for 8 years    Types: Cigarettes    Quit date: 01/24/1962  . Smokeless tobacco: Never Used  . Alcohol Use: No    Review of Systems  Gastrointestinal: Positive for abdominal pain and abdominal distention. Negative for nausea and vomiting.  Genitourinary: Positive for frequency and difficulty urinating. Negative for dysuria and hematuria.  All other systems reviewed and are negative.     Allergies  Review of patient's allergies indicates no known allergies.  Home Medications   Prior to Admission medications   Medication Sig Start Date End Date Taking? Authorizing Provider  amLODipine (NORVASC) 5 MG tablet Take 1 tablet (5 mg total) by mouth daily. 09/19/14  Yes Aleksei Plotnikov V, MD  aspirin 81 MG tablet Take 81 mg by mouth daily.     Yes Historical Provider, MD  cloNIDine (CATAPRES) 0.2 MG tablet TAKE ONE TABLET BY MOUTH TWICE DAILY 12/11/14  Yes Aleksei Plotnikov V, MD  dextromethorphan-guaiFENesin (MUCINEX DM) 30-600 MG 12hr tablet Take 1  tablet by mouth 2 (two) times daily as needed for cough.   Yes Historical Provider, MD  finasteride (PROSCAR) 5 MG tablet Take 1 tablet by mouth daily. 12/10/14  Yes Historical Provider, MD  furosemide (LASIX) 40 MG tablet TAKE ONE TABLET BY MOUTH ONCE DAILY PRN swelling 09/15/14  Yes Aleksei Plotnikov V, MD  levofloxacin (LEVAQUIN) 500 MG tablet Take 1 tablet (500 mg total) by mouth daily. 01/13/15  Yes Aleksei Plotnikov V, MD  loratadine (CLARITIN) 10 MG tablet Take 1 tablet (10 mg total) by mouth daily. 11/13/14  Yes Aleksei Plotnikov V, MD  losartan  (COZAAR) 100 MG tablet TAKE ONE TABLET BY MOUTH DAILY 12/22/14  Yes Aleksei Plotnikov V, MD  lovastatin (MEVACOR) 40 MG tablet TAKE ONE TABLET BY MOUTH ONCE DAILY AT  6  PM 11/11/14  Yes Aleksei Plotnikov V, MD  oxybutynin (DITROPAN) 5 MG tablet Take 1 tablet (5 mg total) by mouth every 8 (eight) hours as needed for bladder spasms. 09/15/14  Yes Aleksei Plotnikov V, MD  tamsulosin (FLOMAX) 0.4 MG CAPS capsule Take 1 capsule (0.4 mg total) by mouth daily. 09/15/14  Yes Arloa Koh, MD  Xylometazoline HCl (4-WAY NASAL SPRAY NA) Place 1 spray into the nose 2 (two) times daily as needed (congestion).   Yes Historical Provider, MD   BP 182/88 mmHg  Pulse 68  Temp(Src) 97.9 F (36.6 C) (Oral)  Resp 18  Ht 5\' 9"  (1.753 m)  Wt 106.595 kg  BMI 34.69 kg/m2  SpO2 99% Physical Exam  Constitutional: He appears well-developed and well-nourished. He appears distressed.  Pt tearful and appears in pain  HENT:  Head: Normocephalic and atraumatic.  Eyes: Conjunctivae are normal. Right eye exhibits no discharge. Left eye exhibits no discharge. No scleral icterus.  Neck: Normal range of motion.  Cardiovascular: Normal rate, regular rhythm and normal heart sounds.   Pulmonary/Chest: Effort normal and breath sounds normal. No respiratory distress.  Abdominal: Soft. He exhibits distension. There is tenderness in the suprapubic area. There is no rebound and no guarding.    Musculoskeletal: Normal range of motion. He exhibits no edema or tenderness.  Neurological: He is alert. Coordination normal.  Skin: Skin is warm and dry.  Psychiatric: He has a normal mood and affect. His behavior is normal.  Nursing note and vitals reviewed.   ED Course  Procedures (including critical care time) Labs Review Labs Reviewed  CBC WITH DIFFERENTIAL/PLATELET - Abnormal; Notable for the following:    Hemoglobin 11.8 (*)    HCT 35.5 (*)    All other components within normal limits  BASIC METABOLIC PANEL - Abnormal;  Notable for the following:    Potassium 3.1 (*)    Glucose, Bld 143 (*)    All other components within normal limits  URINALYSIS, ROUTINE W REFLEX MICROSCOPIC (NOT AT [facility])    Imaging Review No results found. I have personally reviewed and evaluated these images and lab results as part of my medical decision-making.   EKG Interpretation None     6:30 - Initial assessment and exam. Med tech performing bladder scan which showed 750 cc urine in bladder. In and out cath ordered.  7:15 - Pt reports full resolution of symptoms after catheterization  MDM   Final diagnoses:  Urinary retention   74 year old male with history of prostate cancer s/p radiation therapy presenting with urinary retention x 1 day. Bladder scan revealed 750 cc urine in the bladder. Tenderness and distension noted over suprapubic region. In  and out catheterization performed with full resolution of symptoms. Repeat abdominal exam without tenderness. Pt was noted to be hypertensive prior to catheterization; BP returned to normal afterwards. Pt has follow up appointment scheduled with [facility] in 5 days. Will insert foley catheter and have pt follow up with his urologist. Urinalysis without indication of UTI or hematuria. Pt noted to have K of 3.1 which was repleted in ED. Remaining blood work unremarkable. Discussed case with Dr. Laneta Simmers who agrees with assessment and plan. Return precautions given in discharge paperwork and discussed with pt at bedside. Pt stable for discharge     Josephina Gip, PA-C 01/17/15 [id number]  Leo Grosser, MD 01/17/15 [id number]

## 2015-01-17 NOTE — ED Notes (Signed)
Pt reports taking something for pain about an hour ago.

## 2015-01-22 DIAGNOSIS — N401 Enlarged prostate with lower urinary tract symptoms: Secondary | ICD-10-CM | POA: Diagnosis not present

## 2015-01-22 DIAGNOSIS — C61 Malignant neoplasm of prostate: Secondary | ICD-10-CM | POA: Diagnosis not present

## 2015-01-22 DIAGNOSIS — R338 Other retention of urine: Secondary | ICD-10-CM | POA: Diagnosis not present

## 2015-01-23 DIAGNOSIS — G4733 Obstructive sleep apnea (adult) (pediatric): Secondary | ICD-10-CM | POA: Diagnosis not present

## 2015-02-09 DIAGNOSIS — R338 Other retention of urine: Secondary | ICD-10-CM | POA: Diagnosis not present

## 2015-02-09 DIAGNOSIS — C61 Malignant neoplasm of prostate: Secondary | ICD-10-CM | POA: Diagnosis not present

## 2015-02-09 DIAGNOSIS — N401 Enlarged prostate with lower urinary tract symptoms: Secondary | ICD-10-CM | POA: Diagnosis not present

## 2015-02-13 ENCOUNTER — Encounter (HOSPITAL_COMMUNITY): Payer: Self-pay | Admitting: Emergency Medicine

## 2015-02-13 ENCOUNTER — Emergency Department (HOSPITAL_COMMUNITY)
Admission: EM | Admit: 2015-02-13 | Discharge: 2015-02-13 | Disposition: A | Discharge status: Home / Self Care | Payer: Medicare Other | Attending: Emergency Medicine | Admitting: Emergency Medicine

## 2015-02-13 DIAGNOSIS — Z79899 Other long term (current) drug therapy: Secondary | ICD-10-CM | POA: Insufficient documentation

## 2015-02-13 DIAGNOSIS — Z1389 Encounter for screening for other disorder: Secondary | ICD-10-CM | POA: Diagnosis not present

## 2015-02-13 DIAGNOSIS — E785 Hyperlipidemia, unspecified: Secondary | ICD-10-CM | POA: Diagnosis not present

## 2015-02-13 DIAGNOSIS — Z9981 Dependence on supplemental oxygen: Secondary | ICD-10-CM | POA: Insufficient documentation

## 2015-02-13 DIAGNOSIS — N400 Enlarged prostate without lower urinary tract symptoms: Secondary | ICD-10-CM | POA: Diagnosis not present

## 2015-02-13 DIAGNOSIS — Z8546 Personal history of malignant neoplasm of prostate: Secondary | ICD-10-CM | POA: Insufficient documentation

## 2015-02-13 DIAGNOSIS — Z139 Encounter for screening, unspecified: Secondary | ICD-10-CM

## 2015-02-13 DIAGNOSIS — Z87891 Personal history of nicotine dependence: Secondary | ICD-10-CM | POA: Insufficient documentation

## 2015-02-13 DIAGNOSIS — Z973 Presence of spectacles and contact lenses: Secondary | ICD-10-CM | POA: Insufficient documentation

## 2015-02-13 DIAGNOSIS — Z7982 Long term (current) use of aspirin: Secondary | ICD-10-CM | POA: Insufficient documentation

## 2015-02-13 DIAGNOSIS — I10 Essential (primary) hypertension: Secondary | ICD-10-CM | POA: Diagnosis not present

## 2015-02-13 DIAGNOSIS — Z9889 Other specified postprocedural states: Secondary | ICD-10-CM | POA: Diagnosis not present

## 2015-02-13 DIAGNOSIS — M1990 Unspecified osteoarthritis, unspecified site: Secondary | ICD-10-CM | POA: Insufficient documentation

## 2015-02-13 DIAGNOSIS — Z0000 Encounter for general adult medical examination without abnormal findings: Secondary | ICD-10-CM | POA: Diagnosis not present

## 2015-02-13 DIAGNOSIS — N3943 Post-void dribbling: Secondary | ICD-10-CM | POA: Diagnosis not present

## 2015-02-13 HISTORY — DX: Personal history of other specified conditions: Z87.898

## 2015-02-13 LAB — URINALYSIS, ROUTINE W REFLEX MICROSCOPIC
BILIRUBIN URINE: NEGATIVE
Glucose, UA: NEGATIVE mg/dL
Hgb urine dipstick: NEGATIVE
KETONES UR: NEGATIVE mg/dL
Leukocytes, UA: NEGATIVE
NITRITE: NEGATIVE
PH: 7 (ref 5.0–8.0)
Protein, ur: NEGATIVE mg/dL
Specific Gravity, Urine: 1.01 (ref 1.005–1.030)

## 2015-02-13 NOTE — Discharge Instructions (Signed)
°Emergency Department Resource Guide °1) Find a Doctor and Pay Out of Pocket °Although you won't have to find out who is covered by your insurance plan, it is a good idea to ask around and get recommendations. You will then need to call the office and see if the doctor you have chosen will accept you as a new patient and what types of options they offer for patients who are self-pay. Some doctors offer discounts or will set up payment plans for their patients who do not have insurance, but you will need to ask so you aren't surprised when you get to your appointment. ° °2) Contact Your Local Health Department °Not all health departments have doctors that can see patients for sick visits, but many do, so it is worth a call to see if yours does. If you don't know where your local health department is, you can check in your phone book. The CDC also has a tool to help you locate your state's health department, and many state websites also have listings of all of their local health departments. ° °3) Find a Walk-in Clinic °If your illness is not likely to be very severe or complicated, you may want to try a walk in clinic. These are popping up all over the country in pharmacies, drugstores, and shopping centers. They're usually staffed by nurse practitioners or physician assistants that have been trained to treat common illnesses and complaints. They're usually fairly quick and inexpensive. However, if you have serious medical issues or chronic medical problems, these are probably not your best option. ° °No Primary Care Doctor: °- Call [facility] at  832-8000 - they can help you locate a primary care doctor that  accepts your insurance, provides certain services, etc. °- Physician Referral Service- 1-800-533-3463 ° °Chronic Pain Problems: °Organization         Address  Phone   Notes  °[facility]  (336) 297-2271 Patients need to be referred by their primary care doctor.  ° °Medication  Assistance: °Organization         Address  Phone   Notes  °[place] [facility] [address] °[place], [place] [address] (336) 641-8030 --Must be a resident of [place] °-- Must have NO insurance coverage whatsoever (no Medicaid/ Medicare, etc.) °-- The pt. MUST have a primary care doctor that directs their care regularly and follows them in the community °  °[facility]  (866) 331-1348   °[employer]  (888) 892-1162   ° °Agencies that provide inexpensive medical care: °Organization         Address  Phone   Notes  °[facility]  (336) 832-8035   °[facility]    (336) 832-7272   °[facility] [address] °[place], [place] [address] (336) 832-4777   °[facility] [address], °[place] (336) 271-4999   °[facility]    (336) 373-0678   °[facility]    (336) 272-1050   °[facility] ° [address], [place] Phone:  (336) 832-4444, Fax:  (336) 832-4440 Hours of Operation:  9 am - 6 pm, M-F.  Also accepts Medicaid/Medicare and self-pay.  °[facility] ° [address], [place] Phone: (336) 832-3150, Fax: (336) 832-3151. Hours of Operation:  8:30 am - 5:30 pm, M-F.  Also accepts Medicaid and self-pay.  °[facility] [place] [address], [place] Phone: (336) 878-6027   °[facility] [address], [place], [place] (336)723-1848, Ext. 123 Mondays & Thursdays: 7-9 AM.  First 15 patients are seen on a first come, first serve basis. °  ° °Medicaid-accepting [place] Providers: ° °Organization         Address  Phone   Notes  °[facility] [address], [place] (336) 641-2100 Also accepts self-pay patients.  °[facility] [address], [place] ° (336) 856-9996   °[facility] [address], [place]  (336) 288-8857   °[facility] [address] [place] [address], [place] (336) 299-7000   °Veita Bland [address], [place]  ° (336) 373-1557 Only accepts [place] Access Medicaid patients after they have their name applied to their card.  ° °Self-Pay (no insurance) in [place]: ° °Organization         Address  Phone   Notes  °Sickle Cell Patients, [facility] [address], [place] (336) 832-1970   °[facility] [address], [place] (336) 832-4400   °[facility] [place] ° [address], [place] (336) 992-4800   °[facility]/Dr. Osei-Bonsu ° [address] [place] [address], [place] or [address], [place] (336) [phone] Phone number for both [place] and [place] locations is the same.  °[facility] [address], [place] (336) 299-0000   °[facility] [address] [place] [address], [place] or [address] (336) 852-7530 °(336) 878-2260   °[facility] [address],  (336) 350-1642, phone; (336) 294-5005, fax Sees patients 1st and 3rd Saturday of every month.  Must not qualify for public or private insurance (i.e. Medicaid, Medicare, [place] Health Choice, Veterans' Benefits) • Household income should be no more than 200% of the poverty level •The clinic cannot treat you if you are pregnant or think you are pregnant • Sexually transmitted diseases are not treated at the clinic.  ° ° °Dental Care: °Organization         Address  Phone  Notes  °[place] [facility] [address],  (336) 641-6152 Accepts children up to age 21 who are enrolled in Medicaid or [place] Health Choice; pregnant women with a Medicaid card; and children who have applied for Medicaid or [place] Health Choice, but were declined, whose parents can pay a reduced fee at time of service.  °[place]  [facility] [place]  [address], [place] (336) 641-7733 Accepts children up to age 21 who are enrolled in Medicaid or [place] Health Choice; pregnant women with a Medicaid card; and children who have applied for Medicaid or  Health Choice, but were declined, whose parents can pay a reduced fee at time of service.  °[facility] ° [address],  (336) 641-4533 Patients are seen by appointment only. Walk-ins are not accepted. [facility] will see patients 18 years of age and older. °Monday - Tuesday (8am-5pm) °Most Wednesdays (8:30-5pm) °$30 per visit, cash only  °[facility] ° [address], [place] (336) 641-4533 Patients are seen by appointment only. Walk-ins are not accepted. [facility] will see patients 18 years of age and older. °One   Wednesday Evening (Monthly: Volunteer Based).  $30 per visit, cash only  °[facility]  (919) 537-3737 for adults; Children under age 4, call Graduate Pediatric Dentistry at ([phone]) [phone]. Children aged 4-14, please call ([phone]) [phone] to request a pediatric application. ° Dental services are provided in all areas of dental care including fillings, crowns and bridges, complete and partial dentures, implants, gum treatment, root canals, and extractions. Preventive care is also provided. Treatment is provided to both adults and children. °Patients are selected via a lottery and there is often a waiting list. °  °[facility] [address], °[place] ° (336) 763-8833 [email or web address] °  °[facility] [address], [place], [place] (336)723-1848, Ext. 123 Second and Fourth Thursday of each month, opens at 6:30 AM; Clinic ends at 9 AM.  Patients are seen on a first-come first-served basis, and a limited number are seen during each clinic.  ° °[facility] ° [address], [place], [place] (336) 723-7904    Eligibility Requirements °You must have lived in [place], or [place] counties for at least the last three months. °  You cannot be eligible for state or federal sponsored healthcare insurance, including Veterans Administration, Medicaid, or Medicare. °  You generally cannot be eligible for healthcare insurance through your employer.  °  How to apply: °Eligibility screenings are held every Tuesday and Wednesday afternoon from 1:00 pm until 4:00 pm. You do not need an appointment for the interview!  °[facility] [address], [place], [place] 336-631-2330   °[facility]  336-342-8273   °[facility]  336-703-3100   °[facility]  336-570-6415   ° °Behavioral Health Resources in the Community: °Intensive Outpatient Programs °Organization         Address  Phone  Notes  °[facility] [address], [place], [place] 336-878-6098   °[facility] [address], [place], [place] 336-832-9800   °[facility] [address], [place], [place] ° 336-882-2125   °[facility] [address],  °[place], [place] 1-800-853-5163 or 336-641-4981   °Substance Abuse Resources °Organization         Address  Phone  Notes  °[facility]  336-882-2125   °[facility]  336-784-9470   °[facility]  336-285-9073   °[facility]  336-845-3988   °Residential & Outpatient Substance Abuse Program  1-800-659-3381   °Psychological Services °Organization         Address  Phone  Notes  °[facility]  336- 832-9600   °[facility]  336- 378-7881   °[facility] [address], [place] 1-800-853-5163 or 336-641-4981   ° °Mobile Crisis Teams °Organization         Address  Phone  Notes  °[facility], Mobile Crisis Care Unit  1-877-626-1772   °Assertive °[facility] ° [address]  [place], [place] 336-834-9664   °Sharon DeEsch [address] °[place] 336-554-5454   ° °Self-Help/Support Groups °Organization         Address  Phone             Notes  °[facility]  - variety of support groups  336- 373-1402 Call for more information  °Narcotics Anonymous (NA), [facility] [address], °[place]  2 meetings at this location  ° °  Residential Treatment Programs Organization         Address  Phone  Notes  [facility] [address]    [place]  1-(928) 738-9164   [facility]  [address] [place] [id number], [place], [place]   [place], [place] [phone] Admissions: 8am-3pm M-F  [facility]stance [place] [address],    [place], [place] [id number]   [facility] [address] [place], [place], [place]   [facility] [address],  [place], [place]   [facility] Intensive Outpatient [address], [place], [place]   [facility] ([place].,  [place], [place] 1-403 078 7369 or 9796853532   [facility] [address], [place], [place] Accepts Medicaid  [facility] [address],  [place] 1-501-511-4011 Substance Abuse/Addiction Treatment   [facility]         Address  Phone  Notes  [facility]  (205)467-7762   Domenic Schwab, PhD [address] Arlis [address]a [place], [place]   682-151-9007 or (727)233-8438   [place], [place] [phone]   [facility] [address] [place], [place] 514-524-8302 Insurance/Medicaid/sponsorship through [facility] [address]                                    [place], [place] [phone] [place] [address] [place] [address], [place] ([phone]    Dr. Adele Schilder  [phone]   [facility] 1) [address] [place] 2) [place] 3)  [address], [place] [phone]  [phone]   [place] [phone] or [phone] (After Hours)      Take your usual prescriptions as previously directed.  Call your regular Urologist this morning to schedule a follow up appointment within the next 1 to 2 days.  Return to the Emergency Department immediately sooner if worsening.

## 2015-02-13 NOTE — ED Provider Notes (Signed)
CSN: [id number]     Arrival date & time 02/13/15  0127 History   First MD Initiated Contact with Patient 02/13/15 0137     No chief complaint on file.    HPI Pt was seen at [place]. Per pt and his wife, c/o gradual onset and persistence of intermittent "urinary dribbling" that began last evening PTA. Pt states he has hx of same and "needed a catheter put in." Pt states his Uro MD removed his foley catheter last Monday. Pt states he was concerned his "bladder was full again." Denies N/V/D, no abd pain, no flank pain, no fevers, no rash, no dysuria/hematuria, no testicular pain/swelling.    Uro: [facility] Past Medical History  Diagnosis Date  . Hypertension   . Osteoarthritis   . Hyperlipidemia   . BPH (benign prostatic hyperplasia)   . Lower urinary tract symptoms (LUTS)   . History of gout     BIG TOE  . OSA on CPAP     MODERATE PER STUDY 03-08-2012  . Wears glasses   . First degree heart block   . Prostate cancer [facility]) urologist-  dr nesi/  oncologist-  dr Ander Slade    Stage T1c,  Gleason 3+4,  PSA 10.57,  vol 171ml  . History of urinary retention 12/2014   Past Surgical History  Procedure Laterality Date  . Total knee arthroplasty Right 06-22-2007  . Colonoscopy  07-06-2006  . Tonsillectomy  as child  . Appendectomy  1966  . Prostate biopsy N/A 03/28/2014    Procedure: BIOPSY TRANSRECTAL ULTRASONIC PROSTATE (TUBP);  Surgeon: Arvil Persons, MD;  [facility];  Service: Urology;  Laterality: N/A;  [place] Rm seed implant N/A 08/12/2014    Procedure: GOLD SEED IMPLANT;  Surgeon: Lowella Bandy, MD;  Location: [facility];  Service: Urology;  Laterality: N/A;   Family History  Problem Relation Age of Onset  . Cancer Brother     pancreatic, stomach  . Diabetes Father    Social History  Substance Use Topics  . Smoking status: Former Smoker -- 0.25 packs/day for 8 years    Types: Cigarettes    Quit date: 01/24/1962  . Smokeless tobacco: Never Used  .  Alcohol Use: No    Review of Systems ROS: Statement: All systems negative except as marked or noted in the HPI; Constitutional: Negative for fever and chills. ; ; Eyes: Negative for eye pain, redness and discharge. ; ; ENMT: Negative for ear pain, hoarseness, nasal congestion, sinus pressure and sore throat. ; ; Cardiovascular: Negative for chest pain, palpitations, diaphoresis, dyspnea and peripheral edema. ; ; Respiratory: Negative for cough, wheezing and stridor. ; ; Gastrointestinal: Negative for nausea, vomiting, diarrhea, abdominal pain, blood in stool, hematemesis, jaundice and rectal bleeding. . ; ; Genitourinary: +"dribbling urine." Negative for dysuria, flank pain and hematuria. ; ; Genital:  No penile drainage or rash, no testicular pain or swelling, no scrotal rash or swelling. ;; Musculoskeletal: Negative for back pain and neck pain. Negative for swelling and trauma.; ; Skin: Negative for pruritus, rash, abrasions, blisters, bruising and skin lesion.; ; Neuro: Negative for headache, lightheadedness and neck stiffness. Negative for weakness, altered level of consciousness , altered mental status, extremity weakness, paresthesias, involuntary movement, seizure and syncope.      Allergies  Review of patient's allergies indicates no known allergies.  Home Medications   Prior to Admission medications   Medication Sig Start Date End Date Taking? Authorizing Provider  amLODipine (NORVASC) 5  MG tablet Take 1 tablet (5 mg total) by mouth daily. 09/19/14   Aleksei Plotnikov V, MD  aspirin 81 MG tablet Take 81 mg by mouth daily.      Historical Provider, MD  cloNIDine (CATAPRES) 0.2 MG tablet TAKE ONE TABLET BY MOUTH TWICE DAILY 12/11/14   Lew Dawes V, MD  dextromethorphan-guaiFENesin (MUCINEX DM) 30-600 MG 12hr tablet Take 1 tablet by mouth 2 (two) times daily as needed for cough.    Historical Provider, MD  finasteride (PROSCAR) 5 MG tablet Take 1 tablet by mouth daily. 12/10/14    Historical Provider, MD  furosemide (LASIX) 40 MG tablet TAKE ONE TABLET BY MOUTH ONCE DAILY PRN swelling 09/15/14   Aleksei Plotnikov V, MD  levofloxacin (LEVAQUIN) 500 MG tablet Take 1 tablet (500 mg total) by mouth daily. 01/13/15   Aleksei Plotnikov V, MD  loratadine (CLARITIN) 10 MG tablet Take 1 tablet (10 mg total) by mouth daily. 11/13/14   Aleksei Plotnikov V, MD  losartan (COZAAR) 100 MG tablet TAKE ONE TABLET BY MOUTH DAILY 12/22/14   Aleksei Plotnikov V, MD  lovastatin (MEVACOR) 40 MG tablet TAKE ONE TABLET BY MOUTH ONCE DAILY AT  6  PM 11/11/14   Aleksei Plotnikov V, MD  oxybutynin (DITROPAN) 5 MG tablet Take 1 tablet (5 mg total) by mouth every 8 (eight) hours as needed for bladder spasms. 09/15/14   Aleksei Plotnikov V, MD  tamsulosin (FLOMAX) 0.4 MG CAPS capsule Take 1 capsule (0.4 mg total) by mouth daily. 09/15/14   Arloa Koh, MD  Xylometazoline HCl (4-WAY NASAL SPRAY NA) Place 1 spray into the nose 2 (two) times daily as needed (congestion).    Historical Provider, MD   BP 179/99 mmHg  Pulse 76  Temp(Src) 97.5 F (36.4 C) (Oral)  Resp 18  SpO2 97% Physical Exam  0150: Physical examination:  Nursing notes reviewed; Vital signs and O2 SAT reviewed;  Constitutional: Well developed, Well nourished, Well hydrated, In no acute distress; Head:  Normocephalic, atraumatic; Eyes: EOMI, PERRL, No scleral icterus; ENMT: Mouth and pharynx normal, Mucous membranes moist; Neck: Supple, Full range of motion, No lymphadenopathy; Cardiovascular: Regular rate and rhythm, No gallop; Respiratory: Breath sounds clear & equal bilaterally, No wheezes.  Speaking full sentences with ease, Normal respiratory effort/excursion; Chest: Nontender, Movement normal; Abdomen: Soft, Nontender, Nondistended, Normal bowel sounds; Genitourinary: No CVA tenderness; Extremities: Pulses normal, No tenderness, No edema, No calf edema or asymmetry.; Neuro: AA&Ox3, Major CN grossly intact.  Speech clear. No gross focal  motor or sensory deficits in extremities. Climbs on and off stretcher easily by himself. Gait steady.; Skin: Color normal, Warm, Dry.   ED Course  Procedures (including critical care time) Labs Review  Imaging Review  I have personally reviewed and evaluated these images and lab results as part of my medical decision-making.   EKG Interpretation None      MDM  MDM Reviewed: previous chart, nursing note and vitals     Results for orders placed or performed during the hospital encounter of 02/13/15  Urinalysis, Routine w reflex microscopic  Result Value Ref Range   Color, Urine YELLOW YELLOW   APPearance CLOUDY (A) CLEAR   Specific Gravity, Urine 1.010 1.005 - 1.030   pH 7.0 5.0 - 8.0   Glucose, UA NEGATIVE NEGATIVE mg/dL   Hgb urine dipstick NEGATIVE NEGATIVE   Bilirubin Urine NEGATIVE NEGATIVE   Ketones, ur NEGATIVE NEGATIVE mg/dL   Protein, ur NEGATIVE NEGATIVE mg/dL   Nitrite NEGATIVE NEGATIVE  Leukocytes, UA NEGATIVE NEGATIVE    0300:  Initial bladder scan: 173ml. Pt urinated spontaneously. Post-void bladder scan: 44ml. Will not place foley at this time. Udip without obvious infection; UC pending. Dx and testing d/w pt and family.  Questions answered.  Verb understanding, agreeable to d/c home with outpt f/u with his Uro MD.      Francine Graven, DO 02/15/15 [id number]

## 2015-02-13 NOTE — ED Notes (Signed)
Bladder scan 140ml

## 2015-02-13 NOTE — ED Notes (Signed)
Spoke with provider/ discontinued folley order

## 2015-02-13 NOTE — ED Notes (Signed)
Patient presents for urinary retention, recent catheter removed on Monday. Reports not feeling like he can empty his bladder.

## 2015-02-14 LAB — URINE CULTURE: CULTURE: NO GROWTH

## 2015-02-22 DIAGNOSIS — G4733 Obstructive sleep apnea (adult) (pediatric): Secondary | ICD-10-CM | POA: Diagnosis not present

## 2015-02-24 DIAGNOSIS — R3913 Splitting of urinary stream: Secondary | ICD-10-CM | POA: Diagnosis not present

## 2015-02-24 DIAGNOSIS — C61 Malignant neoplasm of prostate: Secondary | ICD-10-CM | POA: Diagnosis not present

## 2015-02-24 DIAGNOSIS — Z0000 Encounter for general adult medical examination without abnormal findings: Secondary | ICD-10-CM | POA: Diagnosis not present

## 2015-03-09 DIAGNOSIS — R3916 Straining to void: Secondary | ICD-10-CM | POA: Diagnosis not present

## 2015-03-09 DIAGNOSIS — Z0000 Encounter for general adult medical examination without abnormal findings: Secondary | ICD-10-CM | POA: Diagnosis not present

## 2015-03-09 DIAGNOSIS — R3912 Poor urinary stream: Secondary | ICD-10-CM | POA: Diagnosis not present

## 2015-03-11 ENCOUNTER — Encounter: Payer: Self-pay | Admitting: Pulmonary Disease

## 2015-03-11 ENCOUNTER — Ambulatory Visit (INDEPENDENT_AMBULATORY_CARE_PROVIDER_SITE_OTHER): Payer: Medicare Other | Admitting: Pulmonary Disease

## 2015-03-11 VITALS — BP 124/84 | HR 82 | Ht 69.0 in | Wt 244.4 lb

## 2015-03-11 DIAGNOSIS — J300 Vasomotor rhinitis: Secondary | ICD-10-CM

## 2015-03-11 DIAGNOSIS — G4733 Obstructive sleep apnea (adult) (pediatric): Secondary | ICD-10-CM | POA: Diagnosis not present

## 2015-03-11 MED ORDER — FLUTICASONE PROPIONATE 50 MCG/ACT NA SUSP: 2.0000 | Freq: Every day | NASAL | Status: DC

## 2015-03-11 NOTE — Patient Instructions (Addendum)
Please wear your CPAP for the entire time you sleep at night and during naps as well Use Flonase 2 sprays both nostrils daily Follow up with Dr. Halford Chessman in 1 year, or sooner as needed

## 2015-03-11 NOTE — Progress Notes (Signed)
Subjective:    Patient ID: Jeffrey Carter, male    DOB: 08-15-1940, 75 y.o.   MRN: [id number]  Chief Complaint  Patient presents with  . Follow-up    Former [place] patient: Wears CPAP nightly. Denies problems with mask or pressure. DME: [employer].    HPI 75 year old man with OSA on CPAP here for follow up of CPAP.  Usage days 30/30. 28 days were greater than 4 hours. 2 days less than 4 hours. Has urinary retention and had to go to the ED on the days that he had less than 4 hours of use. Denies dyspnea. No daytime fatigue. He reports he is doing well with his current his mask and machine. Recently got some new masks and tubing. Does not use supplemental oxygen.   PAST MEDICAL HISTORY :  He  has a past medical history of Hypertension; Osteoarthritis; Hyperlipidemia; BPH (benign prostatic hyperplasia); Lower urinary tract symptoms (LUTS); History of gout; OSA on CPAP; Wears glasses; First degree heart block; Prostate cancer [facility]) (urologist-  dr nesi/  oncologist-  dr Ander Slade); and History of urinary retention (12/2014).  PAST SURGICAL HISTORY: He  has past surgical history that includes Total knee arthroplasty (Right, 06-22-2007); Colonoscopy (07-06-2006); Tonsillectomy (as child); Appendectomy (1966); Prostate biopsy (N/A, 03/28/2014); and Gold seed implant (N/A, 08/12/2014).  No Known Allergies  Current Outpatient Prescriptions on File Prior to Visit  Medication Sig  . amLODipine (NORVASC) 5 MG tablet Take 1 tablet (5 mg total) by mouth daily.  Marland Kitchen aspirin 81 MG tablet Take 81 mg by mouth daily.    . cloNIDine (CATAPRES) 0.2 MG tablet TAKE ONE TABLET BY MOUTH TWICE DAILY  . dextromethorphan-guaiFENesin (MUCINEX DM) 30-600 MG 12hr tablet Take 1 tablet by mouth 2 (two) times daily as needed for cough.  . finasteride (PROSCAR) 5 MG tablet Take 1 tablet by mouth daily.  . furosemide (LASIX) 40 MG tablet TAKE ONE TABLET BY MOUTH ONCE DAILY PRN swelling  . loratadine (CLARITIN) 10 MG tablet Take 1  tablet (10 mg total) by mouth daily.  Marland Kitchen losartan (COZAAR) 100 MG tablet TAKE ONE TABLET BY MOUTH DAILY  . lovastatin (MEVACOR) 40 MG tablet TAKE ONE TABLET BY MOUTH ONCE DAILY AT  6  PM  . oxybutynin (DITROPAN) 5 MG tablet Take 1 tablet (5 mg total) by mouth every 8 (eight) hours as needed for bladder spasms.  . tamsulosin (FLOMAX) 0.4 MG CAPS capsule Take 1 capsule (0.4 mg total) by mouth daily.  . Xylometazoline HCl (4-WAY NASAL SPRAY NA) Place 1 spray into the nose 2 (two) times daily as needed (congestion).   No current facility-administered medications on file prior to visit.    FAMILY HISTORY:  His indicated that his mother is deceased. He indicated that his father is deceased. He indicated that his sister is deceased. He indicated that his brother is deceased.   SOCIAL HISTORY: He  reports that he quit smoking about 53 years ago. His smoking use included Cigarettes. He has a 2 pack-year smoking history. He has never used smokeless tobacco. He reports that he does not drink alcohol or use illicit drugs.  Review of Systems No fevers or chills. +Nasal congestion, +rhinorrhea, no post nasal drip. No cough or wheezing. No chest pain.    Objective:   Physical Exam Today's Vitals   03/11/15 1347  BP: 124/84  Pulse: 82  Height: 5\' 9"  (1.753 m)  Weight: 244 lb 6.4 oz (110.859 kg)  SpO2: 98%   General -  No distress ENT - No sinus tenderness, no oral exudate, no LAN, no thyromegaly, TM clear, pupils equal/reactive Cardiac - s1s2 regular, no murmur, pulses symmetric Chest - No wheeze/rales/dullness, good air entry, normal respiratory excursion Back - No focal tenderness Abd - Soft, non-tender, no organomegaly, + bowel sounds Ext - No edema Neuro - Normal strength, cranial nerves intact Skin - No rashes Psych - Normal mood, and behavior.  Assessment & Plan:  75 year old man with OSA on CPAP here for follow up of CPAP. He is compliant of his CPAP machine.  OSA: Instructed patient  to wear his CPAP for entire sleep duration and during naps. -Follow up in 1 year with Dr. Halford Chessman  Nasal congestion: He reports -Flonase 2 sprays daily  Health Maintenance: Received influenza vaccine 10/09/2014  Jacques Earthly, MD  Internal Medicine PGY-2   Reviewed above, examined.    PSG 02/29/12 >> AHI 32, SpO2 low 79%  He was confused about how much he needed to use his CPAP.  He was told to use for 4 hours, so he was doing this but then taking machine off.  His download shows good control with CPAP 12 >> AHI less than 5.  He has been using 5 hrs 16 min per night.  He has full face mask.  He has nasal congestion.  Boggy nasal mucosa, no sinus tenderness, HR regular, lungs clear.  Assessment/plan: OSA. - advised to use CPAP for entire time he is asleep - continue CPAP 12 cm H2O  Nasal congestion. - will try him on flonase  Chesley Mires, MD [place] 03/11/2015, 2:23 PM Pager:  [phone] After 3pm call: [phone]

## 2015-03-17 DIAGNOSIS — C61 Malignant neoplasm of prostate: Secondary | ICD-10-CM | POA: Diagnosis not present

## 2015-03-18 ENCOUNTER — Ambulatory Visit (INDEPENDENT_AMBULATORY_CARE_PROVIDER_SITE_OTHER): Payer: Medicare Other | Admitting: Internal Medicine

## 2015-03-18 ENCOUNTER — Encounter: Payer: Self-pay | Admitting: Internal Medicine

## 2015-03-18 VITALS — BP 158/100 | HR 76 | Wt 244.0 lb

## 2015-03-18 DIAGNOSIS — C61 Malignant neoplasm of prostate: Secondary | ICD-10-CM

## 2015-03-18 DIAGNOSIS — R350 Frequency of micturition: Secondary | ICD-10-CM | POA: Diagnosis not present

## 2015-03-18 DIAGNOSIS — J0100 Acute maxillary sinusitis, unspecified: Secondary | ICD-10-CM

## 2015-03-18 DIAGNOSIS — G4733 Obstructive sleep apnea (adult) (pediatric): Secondary | ICD-10-CM | POA: Diagnosis not present

## 2015-03-18 DIAGNOSIS — I10 Essential (primary) hypertension: Secondary | ICD-10-CM

## 2015-03-18 DIAGNOSIS — H6123 Impacted cerumen, bilateral: Secondary | ICD-10-CM | POA: Diagnosis not present

## 2015-03-18 DIAGNOSIS — E785 Hyperlipidemia, unspecified: Secondary | ICD-10-CM

## 2015-03-18 MED ORDER — LOSARTAN POTASSIUM 100 MG PO TABS: 100.0000 mg | ORAL_TABLET | Freq: Every day | ORAL | Status: DC

## 2015-03-18 MED ORDER — CLONIDINE HCL 0.3 MG PO TABS: 0.3000 mg | ORAL_TABLET | Freq: Two times a day (BID) | ORAL | Status: DC

## 2015-03-18 MED ORDER — FUROSEMIDE 40 MG PO TABS: ORAL_TABLET | ORAL | Status: DC

## 2015-03-18 NOTE — Assessment & Plan Note (Signed)
On Lovastatin

## 2015-03-18 NOTE — Assessment & Plan Note (Signed)
Will irrigate 

## 2015-03-18 NOTE — Assessment & Plan Note (Signed)
Will increase Clonidine to 0.3 mg bid

## 2015-03-18 NOTE — Progress Notes (Signed)
Subjective:  Patient ID: Jeffrey Carter, male    DOB: 02/07/1940  Age: 75 y.o. MRN: [id number]  CC: No chief complaint on file.   HPI Jeffrey Carter presents for chronic sinusitis, prostate cancer, h/o urinary retention, prostate ca, HTN f/u  Outpatient Prescriptions Prior to Visit  Medication Sig Dispense Refill  . amLODipine (NORVASC) 5 MG tablet Take 1 tablet (5 mg total) by mouth daily. 30 tablet 5  . aspirin 81 MG tablet Take 81 mg by mouth daily.      . finasteride (PROSCAR) 5 MG tablet Take 1 tablet by mouth daily.    . fluticasone (FLONASE) 50 MCG/ACT nasal spray Place 2 sprays into both nostrils daily. 16 g 2  . loratadine (CLARITIN) 10 MG tablet Take 1 tablet (10 mg total) by mouth daily. 100 tablet 3  . lovastatin (MEVACOR) 40 MG tablet TAKE ONE TABLET BY MOUTH ONCE DAILY AT  6  PM 30 tablet 5  . tamsulosin (FLOMAX) 0.4 MG CAPS capsule Take 1 capsule (0.4 mg total) by mouth daily. 30 capsule 3  . Xylometazoline HCl (4-WAY NASAL SPRAY NA) Place 1 spray into the nose 2 (two) times daily as needed (congestion).    . cloNIDine (CATAPRES) 0.2 MG tablet TAKE ONE TABLET BY MOUTH TWICE DAILY 60 tablet 11  . dextromethorphan-guaiFENesin (MUCINEX DM) 30-600 MG 12hr tablet Take 1 tablet by mouth 2 (two) times daily as needed for cough.    . furosemide (LASIX) 40 MG tablet TAKE ONE TABLET BY MOUTH ONCE DAILY PRN swelling 30 tablet 5  . losartan (COZAAR) 100 MG tablet TAKE ONE TABLET BY MOUTH DAILY 90 tablet 0  . oxybutynin (DITROPAN) 5 MG tablet Take 1 tablet (5 mg total) by mouth every 8 (eight) hours as needed for bladder spasms. 90 tablet 3   No facility-administered medications prior to visit.    ROS Review of Systems  Constitutional: Negative for appetite change, fatigue and unexpected weight change.  HENT: Negative for congestion, nosebleeds, sneezing, sore throat and trouble swallowing.   Eyes: Negative for itching and visual disturbance.  Respiratory: Negative for cough.    Cardiovascular: Negative for chest pain, palpitations and leg swelling.  Gastrointestinal: Negative for nausea, diarrhea, blood in stool and abdominal distention.  Genitourinary: Negative for frequency and hematuria.  Musculoskeletal: Negative for back pain, joint swelling, gait problem and neck pain.  Skin: Negative for rash.  Neurological: Negative for dizziness, tremors, facial asymmetry, speech difficulty, weakness, light-headedness and numbness.  Psychiatric/Behavioral: Negative for suicidal ideas, sleep disturbance, dysphoric mood, decreased concentration and agitation. The patient is not nervous/anxious.     Objective:  BP 158/100 mmHg  Pulse 76  Wt 244 lb (110.678 kg)  SpO2 98%  BP Readings from Last 3 Encounters:  03/18/15 158/100  03/11/15 124/84  02/13/15 167/107    Wt Readings from Last 3 Encounters:  03/18/15 244 lb (110.678 kg)  03/11/15 244 lb 6.4 oz (110.859 kg)  01/17/15 235 lb (106.595 kg)    Physical Exam  Constitutional: He is oriented to person, place, and time. He appears well-developed. No distress.  NAD  HENT:  Mouth/Throat: Oropharynx is clear and moist.  Eyes: Conjunctivae are normal. Pupils are equal, round, and reactive to light.  Neck: Normal range of motion. No JVD present. No thyromegaly present.  Cardiovascular: Normal rate, regular rhythm, normal heart sounds and intact distal pulses.  Exam reveals no gallop and no friction rub.   No murmur heard. Pulmonary/Chest: Effort normal and breath  sounds normal. No respiratory distress. He has no wheezes. He has no rales. He exhibits no tenderness.  Abdominal: Soft. Bowel sounds are normal. He exhibits no distension and no mass. There is no tenderness. There is no rebound and no guarding.  Musculoskeletal: Normal range of motion. He exhibits no edema or tenderness.  Lymphadenopathy:    He has no cervical adenopathy.  Neurological: He is alert and oriented to person, place, and time. He has normal  reflexes. No cranial nerve deficit. He exhibits normal muscle tone. He displays a negative Romberg sign. Coordination and gait normal.  Skin: Skin is warm and dry. No rash noted.  Psychiatric: He has a normal mood and affect. His behavior is normal. Judgment and thought content normal.   Wax B L>>R     Procedure Note :     Procedure :  Ear irrigation B   Indication:  Cerumen impaction   Risks, including pain, dizziness, eardrum perforation, bleeding, infection and others as well as benefits were explained to the patient in detail. Verbal consent was obtained and the patient agreed to proceed.    We used "The Elephant Ear Irrigation Device" filled with lukewarm water for irrigation. A large amount wax was recovered. Procedure has also required manual wax removal with an ear loop.   Tolerated well. Complications: None.   Postprocedure instructions :  Call if problems.   Lab Results  Component Value Date   WBC 5.4 01/17/2015   HGB 11.8* 01/17/2015   HCT 35.5* 01/17/2015   PLT 184 01/17/2015   GLUCOSE 143* 01/17/2015   CHOL 154 02/20/2013   TRIG 121.0 02/20/2013   HDL 48.60 02/20/2013   LDLCALC 81 02/20/2013   ALT 26 10/12/2014   AST 22 10/12/2014   NA 140 01/17/2015   K 3.1* 01/17/2015   CL 106 01/17/2015   CREATININE 1.01 01/17/2015   BUN 11 01/17/2015   CO2 24 01/17/2015   TSH 0.93 04/03/2013   PSA 5.25* 12/11/2013   INR 1.01 10/12/2014    No results found.  Assessment & Plan:   Diagnoses and all orders for this visit:  Essential hypertension  Acute maxillary sinusitis, recurrence not specified  OSA (obstructive sleep apnea)  Urination frequency  Dyslipidemia  Prostate cancer (HCC)  Cerumen impaction, bilateral  Other orders -     furosemide (LASIX) 40 MG tablet; TAKE ONE TABLET BY MOUTH ONCE DAILY PRN swelling -     losartan (COZAAR) 100 MG tablet; Take 1 tablet (100 mg total) by mouth daily. -     cloNIDine (CATAPRES) 0.3 MG tablet; Take 1 tablet  (0.3 mg total) by mouth 2 (two) times daily.  I have discontinued Jeffrey Carter oxybutynin, cloNIDine, and dextromethorphan-guaiFENesin. I have also changed his losartan. Additionally, I am having him start on cloNIDine. Lastly, I am having him maintain his aspirin, tamsulosin, amLODipine, lovastatin, loratadine, finasteride, Xylometazoline HCl (4-WAY NASAL SPRAY NA), fluticasone, and furosemide.  Meds ordered this encounter  Medications  . furosemide (LASIX) 40 MG tablet    Sig: TAKE ONE TABLET BY MOUTH ONCE DAILY PRN swelling    Dispense:  90 tablet    Refill:  1  . losartan (COZAAR) 100 MG tablet    Sig: Take 1 tablet (100 mg total) by mouth daily.    Dispense:  90 tablet    Refill:  3  . cloNIDine (CATAPRES) 0.3 MG tablet    Sig: Take 1 tablet (0.3 mg total) by mouth 2 (two) times daily.  Dispense:  180 tablet    Refill:  3     Follow-up: Return in about 3 months (around 06/15/2015) for a follow-up visit.  [name], MD

## 2015-03-18 NOTE — Assessment & Plan Note (Signed)
F/u w/Urology 

## 2015-03-18 NOTE — Assessment & Plan Note (Signed)
On CPAP. ?

## 2015-03-18 NOTE — Progress Notes (Signed)
Pre visit review using our clinic review tool, if applicable. No additional management support is needed unless otherwise documented below in the visit note. 

## 2015-03-18 NOTE — Assessment & Plan Note (Signed)
Nasal irrigation w/NS helped a lot

## 2015-03-18 NOTE — Assessment & Plan Note (Signed)
Better  

## 2015-03-24 ENCOUNTER — Emergency Department (HOSPITAL_COMMUNITY)
Admission: EM | Admit: 2015-03-24 | Discharge: 2015-03-24 | Disposition: A | Discharge status: Home / Self Care | Payer: Medicare Other | Attending: Emergency Medicine | Admitting: Emergency Medicine

## 2015-03-24 ENCOUNTER — Encounter (HOSPITAL_COMMUNITY): Payer: Self-pay

## 2015-03-24 DIAGNOSIS — G4733 Obstructive sleep apnea (adult) (pediatric): Secondary | ICD-10-CM | POA: Insufficient documentation

## 2015-03-24 DIAGNOSIS — I10 Essential (primary) hypertension: Secondary | ICD-10-CM | POA: Insufficient documentation

## 2015-03-24 DIAGNOSIS — Z9981 Dependence on supplemental oxygen: Secondary | ICD-10-CM | POA: Diagnosis not present

## 2015-03-24 DIAGNOSIS — M1990 Unspecified osteoarthritis, unspecified site: Secondary | ICD-10-CM | POA: Insufficient documentation

## 2015-03-24 DIAGNOSIS — Z79899 Other long term (current) drug therapy: Secondary | ICD-10-CM | POA: Insufficient documentation

## 2015-03-24 DIAGNOSIS — Z8546 Personal history of malignant neoplasm of prostate: Secondary | ICD-10-CM | POA: Diagnosis not present

## 2015-03-24 DIAGNOSIS — Z7982 Long term (current) use of aspirin: Secondary | ICD-10-CM | POA: Diagnosis not present

## 2015-03-24 DIAGNOSIS — I252 Old myocardial infarction: Secondary | ICD-10-CM | POA: Insufficient documentation

## 2015-03-24 DIAGNOSIS — N400 Enlarged prostate without lower urinary tract symptoms: Secondary | ICD-10-CM | POA: Diagnosis not present

## 2015-03-24 DIAGNOSIS — Z8739 Personal history of other diseases of the musculoskeletal system and connective tissue: Secondary | ICD-10-CM | POA: Insufficient documentation

## 2015-03-24 DIAGNOSIS — Z87891 Personal history of nicotine dependence: Secondary | ICD-10-CM | POA: Insufficient documentation

## 2015-03-24 DIAGNOSIS — Z7951 Long term (current) use of inhaled steroids: Secondary | ICD-10-CM | POA: Insufficient documentation

## 2015-03-24 DIAGNOSIS — E785 Hyperlipidemia, unspecified: Secondary | ICD-10-CM | POA: Insufficient documentation

## 2015-03-24 DIAGNOSIS — R339 Retention of urine, unspecified: Secondary | ICD-10-CM

## 2015-03-24 LAB — URINALYSIS, ROUTINE W REFLEX MICROSCOPIC
Bilirubin Urine: NEGATIVE
Glucose, UA: NEGATIVE mg/dL
Ketones, ur: NEGATIVE mg/dL
LEUKOCYTES UA: NEGATIVE
Nitrite: NEGATIVE
PROTEIN: 30 mg/dL — AB
SPECIFIC GRAVITY, URINE: 1.008 (ref 1.005–1.030)
pH: 6.5 (ref 5.0–8.0)

## 2015-03-24 LAB — URINE MICROSCOPIC-ADD ON

## 2015-03-24 NOTE — Discharge Instructions (Signed)
Acute Urinary Retention, Male °Acute urinary retention is the temporary inability to urinate. °This is a common problem in older men. As men age their prostates become larger and block the flow of urine from the bladder. This is usually a problem that has come on gradually.  °HOME CARE INSTRUCTIONS °If you are sent home with a Foley catheter and a drainage system, you will need to discuss the best course of action with your health care provider. While the catheter is in, maintain a good intake of fluids. Keep the drainage bag emptied and lower than your catheter. This is so that contaminated urine will not flow back into your bladder, which could lead to a urinary tract infection. °There are two main types of drainage bags. One is a large bag that usually is used at night. It has a good capacity that will allow you to sleep through the night without having to empty it. The second type is called a leg bag. It has a smaller capacity, so it needs to be emptied more frequently. However, the main advantage is that it can be attached by a leg strap and can go underneath your clothing, allowing you the freedom to move about or leave your home. °Only take over-the-counter or prescription medicines for pain, discomfort, or fever as directed by your health care provider.  °SEEK MEDICAL CARE IF: °· You develop a low-grade fever. °· You experience spasms or leakage of urine with the spasms. °SEEK IMMEDIATE MEDICAL CARE IF:  °· You develop chills or fever. °· Your catheter stops draining urine. °· Your catheter falls out. °· You start to develop increased bleeding that does not respond to rest and increased fluid intake. °MAKE SURE YOU: °· Understand these instructions. °· Will watch your condition. °· Will get help right away if you are not doing well or get worse. °  °This information is not intended to replace advice given to you by your health care provider. Make sure you discuss any questions you have with your health care  provider. °  °Document Released: 04/18/2000 Document Revised: 05/27/2014 Document Reviewed: 06/21/2012 °Elsevier Interactive Patient Education ©2016 [employer] ° °

## 2015-03-24 NOTE — ED Notes (Addendum)
Pt. Unable to void since earlier today, with burning upon previous urination. Pt reports taking diphenhydramine for past 3 days. Hx of prostate cancer radiation treatments. Foley Placed.

## 2015-03-24 NOTE — ED Provider Notes (Signed)
CSN: [id number]     Arrival date & time 03/24/15  1943 History   First MD Initiated Contact with Patient 03/24/15 2025     Chief Complaint  Patient presents with  . Urinary Retention     (Consider location/radiation/quality/duration/timing/severity/associated sxs/prior Treatment) HPI Patient reports he stopped being able to put out urine about 2 hours prior to arrival. He was got a lot of pressure over his lower abdomen and penis area. He states this has happened about a month ago. He does have a history of prostate cancer and radiation treatment. The last time he had a Foley placed however, he reports the urologist thought it was due to the decongestant use. He does report having used a nasal decongestant for about 3 days. No fever chills or back pain Past Medical History  Diagnosis Date  . Hypertension   . Osteoarthritis   . Hyperlipidemia   . BPH (benign prostatic hyperplasia)   . Lower urinary tract symptoms (LUTS)   . History of gout     BIG TOE  . OSA on CPAP     MODERATE PER STUDY 03-08-2012  . Wears glasses   . First degree heart block   . Prostate cancer [facility] urologist-  dr nesi/  oncologist-  dr Ander Slade    Stage T1c,  Gleason 3+4,  PSA 10.57,  vol 129ml  . History of urinary retention 12/2014   Past Surgical History  Procedure Laterality Date  . Total knee arthroplasty Right 06-22-2007  . Colonoscopy  07-06-2006  . Tonsillectomy  as child  . Appendectomy  1966  . Prostate biopsy N/A 03/28/2014    Procedure: BIOPSY TRANSRECTAL ULTRASONIC PROSTATE (TUBP);  Surgeon: Arvil Persons, MD;  Location: [facility];  Service: Urology;  Laterality: N/A;  Girtha Rm seed implant N/A 08/12/2014    Procedure: GOLD SEED IMPLANT;  Surgeon: Lowella Bandy, MD;  Location: [facility];  Service: Urology;  Laterality: N/A;   Family History  Problem Relation Age of Onset  . Cancer Brother     pancreatic, stomach  . Diabetes Father    Social History  Substance Use  Topics  . Smoking status: Former Smoker -- 0.25 packs/day for 8 years    Types: Cigarettes    Quit date: 01/24/1962  . Smokeless tobacco: Never Used  . Alcohol Use: No    Review of Systems 10 Systems reviewed and are negative for acute change except as noted in the HPI.    Allergies  Review of patient's allergies indicates no known allergies.  Home Medications   Prior to Admission medications   Medication Sig Start Date End Date Taking? Authorizing Provider  amLODipine (NORVASC) 5 MG tablet Take 1 tablet (5 mg total) by mouth daily. 09/19/14  Yes Aleksei Plotnikov V, MD  aspirin 81 MG tablet Take 81 mg by mouth daily.     Yes Historical Provider, MD  cloNIDine (CATAPRES) 0.3 MG tablet Take 1 tablet (0.3 mg total) by mouth 2 (two) times daily. 03/18/15  Yes Aleksei Plotnikov V, MD  finasteride (PROSCAR) 5 MG tablet Take 1 tablet by mouth daily. 12/10/14  Yes Historical Provider, MD  fluticasone (FLONASE) 50 MCG/ACT nasal spray Place 2 sprays into both nostrils daily. 03/11/15  Yes Chesley Mires, MD  furosemide (LASIX) 40 MG tablet TAKE ONE TABLET BY MOUTH ONCE DAILY PRN swelling 03/18/15  Yes Aleksei Plotnikov V, MD  loratadine (CLARITIN) 10 MG tablet Take 1 tablet (10 mg total) by mouth daily. 11/13/14  Yes Aleksei Plotnikov V, MD  losartan (COZAAR) 100 MG tablet Take 1 tablet (100 mg total) by mouth daily. 03/18/15  Yes Aleksei Plotnikov V, MD  lovastatin (MEVACOR) 40 MG tablet TAKE ONE TABLET BY MOUTH ONCE DAILY AT  6  PM 11/11/14  Yes Aleksei Plotnikov V, MD  tamsulosin (FLOMAX) 0.4 MG CAPS capsule Take 1 capsule (0.4 mg total) by mouth daily. 09/15/14  Yes Arloa Koh, MD  Xylometazoline HCl (4-WAY NASAL SPRAY NA) Place 1 spray into the nose 2 (two) times daily as needed (congestion).    Historical Provider, MD   BP 167/71 mmHg  Pulse 56  Temp(Src) 98.6 F (37 C) (Oral)  Resp 18  Ht 5\' 9"  (1.753 m)  Wt 255 lb (115.667 kg)  BMI 37.64 kg/m2  SpO2 94% Physical Exam   Constitutional:  Patient is alert and nontoxic. No respiratory distress  HENT:  Head: Normocephalic and atraumatic.  Eyes: EOM are normal.  Pulmonary/Chest: Effort normal.  Abdominal: Soft. Bowel sounds are normal. He exhibits no distension. There is no tenderness.  Patient is examined after Foley catheter has been placed.  Genitourinary: Penis normal.  Musculoskeletal: He exhibits no edema or tenderness.  Neurological: He is alert. Coordination normal.  Skin: Skin is warm and dry.  Psychiatric: He has a normal mood and affect.    ED Course  Procedures (including critical care time) Labs Review Labs Reviewed  URINALYSIS, ROUTINE W REFLEX MICROSCOPIC (NOT AT [facility]) - Abnormal; Notable for the following:    Hgb urine dipstick TRACE (*)    Protein, ur 30 (*)    All other components within normal limits  URINE MICROSCOPIC-ADD ON - Abnormal; Notable for the following:    Squamous Epithelial / LPF 0-5 (*)    Bacteria, UA RARE (*)    All other components within normal limits    Imaging Review No results found. I have personally reviewed and evaluated these images and lab results as part of my medical decision-making.   EKG Interpretation None      MDM   Final diagnoses:  Urinary retention   Patient reports immediate relief after placement of Foley catheter. He has not been having any other acute problems. Urinalysis is negative. Patient sees a urologist and will follow-up this week or the beginning of next week.    Charlesetta Shanks, MD 03/24/15 [phone]

## 2015-03-30 DIAGNOSIS — N401 Enlarged prostate with lower urinary tract symptoms: Secondary | ICD-10-CM | POA: Diagnosis not present

## 2015-03-30 DIAGNOSIS — C61 Malignant neoplasm of prostate: Secondary | ICD-10-CM | POA: Diagnosis not present

## 2015-03-30 DIAGNOSIS — Z0000 Encounter for general adult medical examination without abnormal findings: Secondary | ICD-10-CM | POA: Diagnosis not present

## 2015-03-31 DIAGNOSIS — R3912 Poor urinary stream: Secondary | ICD-10-CM | POA: Diagnosis not present

## 2015-03-31 DIAGNOSIS — R3911 Hesitancy of micturition: Secondary | ICD-10-CM | POA: Diagnosis not present

## 2015-04-01 DIAGNOSIS — R3913 Splitting of urinary stream: Secondary | ICD-10-CM | POA: Diagnosis not present

## 2015-04-01 DIAGNOSIS — Z0000 Encounter for general adult medical examination without abnormal findings: Secondary | ICD-10-CM | POA: Diagnosis not present

## 2015-04-01 DIAGNOSIS — R3912 Poor urinary stream: Secondary | ICD-10-CM | POA: Diagnosis not present

## 2015-04-01 DIAGNOSIS — R3911 Hesitancy of micturition: Secondary | ICD-10-CM | POA: Diagnosis not present

## 2015-04-20 DIAGNOSIS — N401 Enlarged prostate with lower urinary tract symptoms: Secondary | ICD-10-CM | POA: Diagnosis not present

## 2015-04-20 DIAGNOSIS — R338 Other retention of urine: Secondary | ICD-10-CM | POA: Diagnosis not present

## 2015-04-20 DIAGNOSIS — C61 Malignant neoplasm of prostate: Secondary | ICD-10-CM | POA: Diagnosis not present

## 2015-04-23 DIAGNOSIS — G4733 Obstructive sleep apnea (adult) (pediatric): Secondary | ICD-10-CM | POA: Diagnosis not present

## 2015-05-12 DIAGNOSIS — R338 Other retention of urine: Secondary | ICD-10-CM | POA: Diagnosis not present

## 2015-05-12 DIAGNOSIS — N401 Enlarged prostate with lower urinary tract symptoms: Secondary | ICD-10-CM | POA: Diagnosis not present

## 2015-05-12 DIAGNOSIS — R3912 Poor urinary stream: Secondary | ICD-10-CM | POA: Diagnosis not present

## 2015-05-13 ENCOUNTER — Other Ambulatory Visit: Payer: Self-pay | Admitting: Internal Medicine

## 2015-05-23 DIAGNOSIS — G4733 Obstructive sleep apnea (adult) (pediatric): Secondary | ICD-10-CM | POA: Diagnosis not present

## 2015-05-26 DIAGNOSIS — R3912 Poor urinary stream: Secondary | ICD-10-CM | POA: Diagnosis not present

## 2015-05-26 DIAGNOSIS — C61 Malignant neoplasm of prostate: Secondary | ICD-10-CM | POA: Diagnosis not present

## 2015-05-26 DIAGNOSIS — Z0000 Encounter for general adult medical examination without abnormal findings: Secondary | ICD-10-CM | POA: Diagnosis not present

## 2015-05-26 DIAGNOSIS — G4733 Obstructive sleep apnea (adult) (pediatric): Secondary | ICD-10-CM | POA: Diagnosis not present

## 2015-05-26 DIAGNOSIS — R338 Other retention of urine: Secondary | ICD-10-CM | POA: Diagnosis not present

## 2015-06-05 DIAGNOSIS — R310 Gross hematuria: Secondary | ICD-10-CM | POA: Diagnosis not present

## 2015-06-05 DIAGNOSIS — Z0000 Encounter for general adult medical examination without abnormal findings: Secondary | ICD-10-CM | POA: Diagnosis not present

## 2015-06-05 DIAGNOSIS — R3912 Poor urinary stream: Secondary | ICD-10-CM | POA: Diagnosis not present

## 2015-06-05 DIAGNOSIS — N401 Enlarged prostate with lower urinary tract symptoms: Secondary | ICD-10-CM | POA: Diagnosis not present

## 2015-06-05 DIAGNOSIS — R338 Other retention of urine: Secondary | ICD-10-CM | POA: Diagnosis not present

## 2015-06-05 DIAGNOSIS — C61 Malignant neoplasm of prostate: Secondary | ICD-10-CM | POA: Diagnosis not present

## 2015-06-08 ENCOUNTER — Emergency Department (HOSPITAL_COMMUNITY)
Admission: EM | Admit: 2015-06-08 | Discharge: 2015-06-09 | Disposition: A | Discharge status: Home / Self Care | Payer: Medicare Other | Attending: Emergency Medicine | Admitting: Emergency Medicine

## 2015-06-08 ENCOUNTER — Encounter (HOSPITAL_COMMUNITY): Payer: Self-pay

## 2015-06-08 DIAGNOSIS — T83011A Breakdown (mechanical) of indwelling urethral catheter, initial encounter: Secondary | ICD-10-CM | POA: Diagnosis not present

## 2015-06-08 DIAGNOSIS — Z7982 Long term (current) use of aspirin: Secondary | ICD-10-CM | POA: Diagnosis not present

## 2015-06-08 DIAGNOSIS — M1990 Unspecified osteoarthritis, unspecified site: Secondary | ICD-10-CM | POA: Diagnosis not present

## 2015-06-08 DIAGNOSIS — I10 Essential (primary) hypertension: Secondary | ICD-10-CM | POA: Insufficient documentation

## 2015-06-08 DIAGNOSIS — Z87891 Personal history of nicotine dependence: Secondary | ICD-10-CM | POA: Diagnosis not present

## 2015-06-08 DIAGNOSIS — N401 Enlarged prostate with lower urinary tract symptoms: Secondary | ICD-10-CM | POA: Insufficient documentation

## 2015-06-08 DIAGNOSIS — R319 Hematuria, unspecified: Secondary | ICD-10-CM

## 2015-06-08 DIAGNOSIS — Z7951 Long term (current) use of inhaled steroids: Secondary | ICD-10-CM | POA: Insufficient documentation

## 2015-06-08 DIAGNOSIS — Z8546 Personal history of malignant neoplasm of prostate: Secondary | ICD-10-CM | POA: Diagnosis not present

## 2015-06-08 DIAGNOSIS — Y732 Prosthetic and other implants, materials and accessory gastroenterology and urology devices associated with adverse incidents: Secondary | ICD-10-CM | POA: Diagnosis not present

## 2015-06-08 DIAGNOSIS — R338 Other retention of urine: Secondary | ICD-10-CM | POA: Insufficient documentation

## 2015-06-08 DIAGNOSIS — T83091A Other mechanical complication of indwelling urethral catheter, initial encounter: Secondary | ICD-10-CM | POA: Insufficient documentation

## 2015-06-08 DIAGNOSIS — Z79899 Other long term (current) drug therapy: Secondary | ICD-10-CM | POA: Diagnosis not present

## 2015-06-08 DIAGNOSIS — G4733 Obstructive sleep apnea (adult) (pediatric): Secondary | ICD-10-CM | POA: Insufficient documentation

## 2015-06-08 DIAGNOSIS — Z96651 Presence of right artificial knee joint: Secondary | ICD-10-CM | POA: Insufficient documentation

## 2015-06-08 DIAGNOSIS — R339 Retention of urine, unspecified: Secondary | ICD-10-CM | POA: Diagnosis present

## 2015-06-08 NOTE — ED Notes (Signed)
Pt has an indwelling catheter for two weeks and tonight he started leaking urine and blood around the catheter, the catheter is clogged and he's not emptying his bladder completely.

## 2015-06-08 NOTE — ED Provider Notes (Signed)
CSN: [id number]     Arrival date & time 06/08/15  2323 History  By signing my name below, I, Irene Pap, attest that this documentation has been prepared under the direction and in the presence of Delora Fuel, MD. Electronically Signed: Irene Pap, ED Scribe. 06/08/2015. 11:49 PM.    Chief Complaint  Patient presents with  . Urinary Retention   The history is provided by the patient. No language interpreter was used.  HPI Comments: Jeffrey Carter is a 75 y.o. male with a hx of HTN, BPH, LUTS, urinary retention, and prostate cancer who presents to the Emergency Department complaining of urinary retention onset earlier this evening. Pt states that he has an indwelling catheter that was placed two weeks ago. He noticed blood and urine leaking out around the catheter with a blood clot blocking it. He states that he has not been emptying his bladder completely. Pt recently finished radiation therapy and is not currently on chemo. He denies fever or chills.   Urologist: Dr. Pilar Jarvis  Past Medical History  Diagnosis Date  . Hypertension   . Osteoarthritis   . Hyperlipidemia   . BPH (benign prostatic hyperplasia)   . Lower urinary tract symptoms (LUTS)   . History of gout     BIG TOE  . OSA on CPAP     MODERATE PER STUDY 03-08-2012  . Wears glasses   . First degree heart block   . Prostate cancer [facility]) urologist-  dr nesi/  oncologist-  dr Ander Slade    Stage T1c,  Gleason 3+4,  PSA 10.57,  vol 143ml  . History of urinary retention 12/2014   Past Surgical History  Procedure Laterality Date  . Total knee arthroplasty Right 06-22-2007  . Colonoscopy  07-06-2006  . Tonsillectomy  as child  . Appendectomy  1966  . Prostate biopsy N/A 03/28/2014    Procedure: BIOPSY TRANSRECTAL ULTRASONIC PROSTATE (TUBP);  Surgeon: Arvil Persons, MD;  Location: [facility];  Service: Urology;  Laterality: N/A;  Girtha Rm seed implant N/A 08/12/2014    Procedure: GOLD SEED IMPLANT;  Surgeon: Lowella Bandy, MD;  Location: [facility];  Service: Urology;  Laterality: N/A;   Family History  Problem Relation Age of Onset  . Cancer Brother     pancreatic, stomach  . Diabetes Father    Social History  Substance Use Topics  . Smoking status: Former Smoker -- 0.25 packs/day for 8 years    Types: Cigarettes    Quit date: 01/24/1962  . Smokeless tobacco: Never Used  . Alcohol Use: No    Review of Systems  Constitutional: Negative for fever and chills.  Genitourinary: Positive for hematuria and decreased urine volume.  All other systems reviewed and are negative.  Allergies  Review of patient's allergies indicates no known allergies.  Home Medications   Prior to Admission medications   Medication Sig Start Date End Date Taking? Authorizing Provider  amLODipine (NORVASC) 5 MG tablet TAKE ONE TABLET BY MOUTH ONCE DAILY 05/14/15   Lew Dawes V, MD  aspirin 81 MG tablet Take 81 mg by mouth daily.      Historical Provider, MD  cloNIDine (CATAPRES) 0.3 MG tablet Take 1 tablet (0.3 mg total) by mouth 2 (two) times daily. 03/18/15   Aleksei Plotnikov V, MD  finasteride (PROSCAR) 5 MG tablet Take 1 tablet by mouth daily. 12/10/14   Historical Provider, MD  fluticasone (FLONASE) 50 MCG/ACT nasal spray Place 2 sprays into both  nostrils daily. 03/11/15   Chesley Mires, MD  furosemide (LASIX) 40 MG tablet TAKE ONE TABLET BY MOUTH ONCE DAILY PRN swelling 03/18/15   Aleksei Plotnikov V, MD  loratadine (CLARITIN) 10 MG tablet Take 1 tablet (10 mg total) by mouth daily. 11/13/14   Aleksei Plotnikov V, MD  losartan (COZAAR) 100 MG tablet Take 1 tablet (100 mg total) by mouth daily. 03/18/15   Aleksei Plotnikov V, MD  lovastatin (MEVACOR) 40 MG tablet TAKE ONE TABLET BY MOUTH ONCE DAILY AT [facility] 05/14/15   Aleksei Plotnikov V, MD  tamsulosin (FLOMAX) 0.4 MG CAPS capsule Take 1 capsule (0.4 mg total) by mouth daily. 09/15/14   Arloa Koh, MD  Xylometazoline HCl (4-WAY NASAL SPRAY NA) Place  1 spray into the nose 2 (two) times daily as needed (congestion).    Historical Provider, MD   There were no vitals taken for this visit. Physical Exam  Constitutional: He is oriented to person, place, and time. He appears well-developed and well-nourished.  HENT:  Head: Normocephalic and atraumatic.  Eyes: EOM are normal. Pupils are equal, round, and reactive to light.  Neck: Normal range of motion. Neck supple. No JVD present.  Cardiovascular: Normal rate, regular rhythm and normal heart sounds.  Exam reveals no gallop and no friction rub.   No murmur heard. Pulmonary/Chest: Effort normal and breath sounds normal. He has no wheezes. He has no rales. He exhibits no tenderness.  Abdominal: Soft. Bowel sounds are normal. He exhibits no distension and no mass. There is no tenderness.  Bladder distended  Genitourinary: Uncircumcised.  Catheter in place  Musculoskeletal: Normal range of motion. He exhibits no edema.  Lymphadenopathy:    He has no cervical adenopathy.  Neurological: He is alert and oriented to person, place, and time.  Skin: Skin is warm and dry. No rash noted.  Psychiatric: He has a normal mood and affect. His behavior is normal. Judgment and thought content normal.  Nursing note and vitals reviewed.   ED Course  Procedures (including critical care time) DIAGNOSTIC STUDIES: Oxygen Saturation is 98% on room air, normal by my interpretation.    COORDINATION OF CARE: 11:46 PM-Discussed treatment plan which includes in and out cath with pt at bedside and pt agreed to plan.    Labs Review Results for orders placed or performed during the hospital encounter of 06/08/15  Urinalysis, Routine w reflex microscopic- may I&O cath if menses  Result Value Ref Range   Color, Urine RED (A) YELLOW   APPearance TURBID (A) CLEAR   Specific Gravity, Urine 1.015 1.005 - 1.030   pH 6.5 5.0 - 8.0   Glucose, UA NEGATIVE NEGATIVE mg/dL   Hgb urine dipstick LARGE (A) NEGATIVE    Bilirubin Urine MODERATE (A) NEGATIVE   Ketones, ur 15 (A) NEGATIVE mg/dL   Protein, ur >300 (A) NEGATIVE mg/dL   Nitrite POSITIVE (A) NEGATIVE   Leukocytes, UA SMALL (A) NEGATIVE  Urine microscopic-add on  Result Value Ref Range   Squamous Epithelial / LPF 0-5 (A) NONE SEEN   WBC, UA 0-5 0 - 5 WBC/hpf   RBC / HPF TOO NUMEROUS TO COUNT 0 - 5 RBC/hpf   Bacteria, UA FEW (A) NONE SEEN   Urine-Other URINALYSIS PERFORMED ON SUPERNATANT     I have personally reviewed and evaluated these lab results as part of my medical decision-making.    MDM   Final diagnoses:  Obstructed Foley catheter, initial encounter (Andrew)  Hematuria    Foley catheter failure  secondary to occlusion from clot. Catheter was removed demonstrating clot and its opening. New catheter was placed and a bladder was irrigated. Following this, he had good flow of urine although continued to be blood tinged. Old records reviewed and he does have 2 prior ED visits for urinary retention and he is followed for known prostate cancer. He is referred back to his urologist for follow-up.  I personally performed the services described in this documentation, which was scribed in my presence. The recorded information has been reviewed and is accurate.      Delora Fuel, MD [id number]

## 2015-06-09 LAB — URINALYSIS, ROUTINE W REFLEX MICROSCOPIC
GLUCOSE, UA: NEGATIVE mg/dL
KETONES UR: 15 mg/dL — AB
Nitrite: POSITIVE — AB
PH: 6.5 (ref 5.0–8.0)
Protein, ur: >300 mg/dL — AB
Specific Gravity, Urine: 1.015 (ref 1.005–1.030)

## 2015-06-09 LAB — URINE MICROSCOPIC-ADD ON

## 2015-06-09 NOTE — ED Notes (Signed)
Patient was alert, oriented and stable upon discharge. RN went over AVS and patient had no further questions.  

## 2015-06-09 NOTE — ED Notes (Signed)
Foley irrigated with 120cc sterile water. Good return. Small clots noted. Urine still bright red. Will continue to monitor.

## 2015-06-09 NOTE — Discharge Instructions (Signed)
Foley Catheter Care, Adult °A Foley catheter is a soft, flexible tube that is placed into the bladder to drain urine. A Foley catheter may be inserted if: °· You leak urine or are not able to control when you urinate (urinary incontinence). °· You are not able to urinate when you need to (urinary retention). °· You had prostate surgery or surgery on the genitals. °· You have certain medical conditions, such as multiple sclerosis, dementia, or a spinal cord injury. °If you are going home with a Foley catheter in place, follow the instructions below. °TAKING CARE OF THE CATHETER °1. Wash your hands with soap and water. °2. Using mild soap and warm water on a clean washcloth: °¨ Clean the area on your body closest to the catheter insertion site using a circular motion, moving away from the catheter. Never wipe toward the catheter because this could sweep bacteria up into the urethra and cause infection. °¨ Remove all traces of soap. Pat the area dry with a clean towel. For males, reposition the foreskin. °3. Attach the catheter to your leg so there is no tension on the catheter. Use adhesive tape or a leg strap. If you are using adhesive tape, remove any sticky residue left behind by the previous tape you used. °4. Keep the drainage bag below the level of the bladder, but keep it off the floor. °5. Check throughout the day to be sure the catheter is working and urine is draining freely. Make sure the tubing does not become kinked. °6. Do not pull on the catheter or try to remove it. Pulling could damage internal tissues. °TAKING CARE OF THE DRAINAGE BAGS °You will be given two drainage bags to take home. One is a large overnight drainage bag, and the other is a smaller leg bag that fits underneath clothing. You may wear the overnight bag at any time, but you should never wear the smaller leg bag at night. Follow the instructions below for how to empty, change, and clean your drainage bags. °Emptying the Drainage  Bag °You must empty your drainage bag when it is  -½ full or at least 2-3 times a day. °1. Wash your hands with soap and water. °2. Keep the drainage bag below your hips, below the level of your bladder. This stops urine from going back into the tubing and into your bladder. °3. Hold the dirty bag over the toilet or a clean container. °4. Open the pour spout at the bottom of the bag and empty the urine into the toilet or container. Do not let the pour spout touch the toilet, container, or any other surface. Doing so can place bacteria on the bag, which can cause an infection. °5. Clean the pour spout with a gauze pad or cotton ball that has rubbing alcohol on it. °6. Close the pour spout. °7. Attach the bag to your leg with adhesive tape or a leg strap. °8. Wash your hands well. °Changing the Drainage Bag °Change your drainage bag once a month or sooner if it starts to smell bad or look dirty. Below are steps to follow when changing the drainage bag. °1. Wash your hands with soap and water. °2. Pinch off the rubber catheter so that urine does not spill out. °3. Disconnect the catheter tube from the drainage tube at the connection valve. Do not let the tubes touch any surface. °4. Clean the end of the catheter tube with an alcohol wipe. Use a different alcohol wipe to clean   the end of the drainage tube. °5. Connect the catheter tube to the drainage tube of the clean drainage bag. °6. Attach the new bag to the leg with adhesive tape or a leg strap. Avoid attaching the new bag too tightly. °7. Wash your hands well. °Cleaning the Drainage Bag °1. Wash your hands with soap and water. °2. Wash the bag in warm, soapy water. °3. Rinse the bag thoroughly with warm water. °4. Fill the bag with a solution of white vinegar and water (1 cup vinegar to 1 qt warm water [.2 L vinegar to 1 L warm water]). Close the bag and soak it for 30 minutes in the solution. °5. Rinse the bag with warm water. °6. Hang the bag to dry with the  pour spout open and hanging downward. °7. Store the clean bag (once it is dry) in a clean plastic bag. °8. Wash your hands well. °PREVENTING INFECTION °· Wash your hands before and after handling your catheter. °· Take showers daily and wash the area where the catheter enters your body. Do not take baths. Replace wet leg straps with dry ones, if this applies. °· Do not use powders, sprays, or lotions on the genital area. Only use creams, lotions, or ointments as directed by your caregiver. °· For females, wipe from front to back after each bowel movement. °· Drink enough fluids to keep your urine clear or pale yellow unless you have a fluid restriction. °· Do not let the drainage bag or tubing touch or lie on the floor. °· Wear cotton underwear to absorb moisture and to keep your skin drier. °SEEK MEDICAL CARE IF:  °· Your urine is cloudy or smells unusually bad. °· Your catheter becomes clogged. °· You are not draining urine into the bag or your bladder feels full. °· Your catheter starts to leak. °SEEK IMMEDIATE MEDICAL CARE IF:  °· You have pain, swelling, redness, or pus where the catheter enters the body. °· You have pain in the abdomen, legs, lower back, or bladder. °· You have a fever. °· You see blood fill the catheter, or your urine is pink or red. °· You have nausea, vomiting, or chills. °· Your catheter gets pulled out. °MAKE SURE YOU:  °· Understand these instructions. °· Will watch your condition. °· Will get help right away if you are not doing well or get worse. °  °This information is not intended to replace advice given to you by your health care provider. Make sure you discuss any questions you have with your health care provider. °  °Document Released: 01/10/2005 Document Revised: 05/27/2013 Document Reviewed: 01/02/2012 °Elsevier Interactive Patient Education ©2016 [employer] ° °

## 2015-06-09 NOTE — ED Notes (Signed)
Pt's foley bag changed to a leg bag. Told to f/u with urology tomorrow.

## 2015-06-10 DIAGNOSIS — R310 Gross hematuria: Secondary | ICD-10-CM | POA: Diagnosis not present

## 2015-06-10 DIAGNOSIS — C61 Malignant neoplasm of prostate: Secondary | ICD-10-CM | POA: Diagnosis not present

## 2015-06-11 ENCOUNTER — Encounter (HOSPITAL_COMMUNITY): Payer: Self-pay | Admitting: *Deleted

## 2015-06-11 ENCOUNTER — Emergency Department (HOSPITAL_COMMUNITY)
Admission: EM | Admit: 2015-06-11 | Discharge: 2015-06-12 | Disposition: A | Discharge status: Home / Self Care | Payer: Medicare Other | Attending: Emergency Medicine | Admitting: Emergency Medicine

## 2015-06-11 DIAGNOSIS — Z79899 Other long term (current) drug therapy: Secondary | ICD-10-CM | POA: Insufficient documentation

## 2015-06-11 DIAGNOSIS — Z7982 Long term (current) use of aspirin: Secondary | ICD-10-CM | POA: Diagnosis not present

## 2015-06-11 DIAGNOSIS — Z96651 Presence of right artificial knee joint: Secondary | ICD-10-CM | POA: Diagnosis not present

## 2015-06-11 DIAGNOSIS — Z8546 Personal history of malignant neoplasm of prostate: Secondary | ICD-10-CM | POA: Insufficient documentation

## 2015-06-11 DIAGNOSIS — M1990 Unspecified osteoarthritis, unspecified site: Secondary | ICD-10-CM | POA: Insufficient documentation

## 2015-06-11 DIAGNOSIS — Z87891 Personal history of nicotine dependence: Secondary | ICD-10-CM | POA: Insufficient documentation

## 2015-06-11 DIAGNOSIS — G4733 Obstructive sleep apnea (adult) (pediatric): Secondary | ICD-10-CM | POA: Diagnosis not present

## 2015-06-11 DIAGNOSIS — I10 Essential (primary) hypertension: Secondary | ICD-10-CM | POA: Diagnosis not present

## 2015-06-11 DIAGNOSIS — R339 Retention of urine, unspecified: Secondary | ICD-10-CM | POA: Insufficient documentation

## 2015-06-11 DIAGNOSIS — Y733 Surgical instruments, materials and gastroenterology and urology devices (including sutures) associated with adverse incidents: Secondary | ICD-10-CM | POA: Insufficient documentation

## 2015-06-11 DIAGNOSIS — E785 Hyperlipidemia, unspecified: Secondary | ICD-10-CM | POA: Insufficient documentation

## 2015-06-11 DIAGNOSIS — T83098A Other mechanical complication of other indwelling urethral catheter, initial encounter: Secondary | ICD-10-CM | POA: Diagnosis present

## 2015-06-11 MED ORDER — CIPROFLOXACIN HCL 500 MG PO TABS: 500.0000 mg | ORAL_TABLET | Freq: Two times a day (BID) | ORAL | Status: DC

## 2015-06-11 NOTE — ED Notes (Signed)
Pt states that he came Mon and had a F/C placed for Urinary retention; pt states that he was getting dressed this morning and pulled on the catheter by accident; pt states that he has felt burning and discomfort since then; pt states that he feels pressure like he needs to urinate; pt states that he has burning when the urine passes through the catheter; pt has leg bag in place with urine in collection bag; pt states "It's not draining like it should"

## 2015-06-11 NOTE — ED Notes (Signed)
Patient was alert, oriented and stable upon discharge. RN went over AVS and patient had no further questions.  

## 2015-06-11 NOTE — ED Provider Notes (Signed)
CSN: [id number]     Arrival date & time 06/11/15  2033 History  By signing my name below, I, [facility], attest that this documentation has been prepared under the direction and in the presence of Orpah Greek, MD. Electronically Signed: Virgel Bouquet, ED Scribe. 06/11/2015. 11:23 PM.   Chief Complaint  Patient presents with  . Urinary Catheter Problems    The history is provided by the patient. No language interpreter was used.   HPI Comments: Jeffrey Carter is a 75 y.o. male who presents to the Emergency Department with a urinary catheter problem. Pt states that he had a foley catheter placed 3 days ago and was getting dressed this morning when he accidentally pulled on the tubing of the catheter and followed immediately by pain. He reports urinary urgency that he describes as pressure, dysuria when the urine passes through the catheter, and reduced urine volume in the collection bag. Denies any pain or other symptoms currently.  Past Medical History  Diagnosis Date  . Hypertension   . Osteoarthritis   . Hyperlipidemia   . BPH (benign prostatic hyperplasia)   . Lower urinary tract symptoms (LUTS)   . History of gout     BIG TOE  . OSA on CPAP     MODERATE PER STUDY 03-08-2012  . Wears glasses   . First degree heart block   . Prostate cancer [facility]) urologist-  dr nesi/  oncologist-  dr Ander Slade    Stage T1c,  Gleason 3+4,  PSA 10.57,  vol 160ml  . History of urinary retention 12/2014   Past Surgical History  Procedure Laterality Date  . Total knee arthroplasty Right 06-22-2007  . Colonoscopy  07-06-2006  . Tonsillectomy  as child  . Appendectomy  1966  . Prostate biopsy N/A 03/28/2014    Procedure: BIOPSY TRANSRECTAL ULTRASONIC PROSTATE (TUBP);  Surgeon: Arvil Persons, MD;  Location: [facility];  Service: Urology;  Laterality: N/A;  Girtha Rm seed implant N/A 08/12/2014    Procedure: GOLD SEED IMPLANT;  Surgeon: Lowella Bandy, MD;  Location: [facility];  Service: Urology;  Laterality: N/A;   Family History  Problem Relation Age of Onset  . Cancer Brother     pancreatic, stomach  . Diabetes Father    Social History  Substance Use Topics  . Smoking status: Former Smoker -- 0.25 packs/day for 8 years    Types: Cigarettes    Quit date: 01/24/1962  . Smokeless tobacco: Never Used  . Alcohol Use: No    Review of Systems  Genitourinary: Positive for dysuria and urgency.  All other systems reviewed and are negative.  Allergies  Review of patient's allergies indicates no known allergies.  Home Medications   Prior to Admission medications   Medication Sig Start Date End Date Taking? Authorizing Provider  amLODipine (NORVASC) 5 MG tablet TAKE ONE TABLET BY MOUTH ONCE DAILY Patient taking differently: TAKE 5 MG BY MOUTH ONCE DAILY 05/14/15  Yes Aleksei Plotnikov V, MD  cloNIDine (CATAPRES) 0.3 MG tablet Take 1 tablet (0.3 mg total) by mouth 2 (two) times daily. 03/18/15  Yes Aleksei Plotnikov V, MD  finasteride (PROSCAR) 5 MG tablet Take 5 mg by mouth daily.  12/10/14  Yes Historical Provider, MD  fluticasone (FLONASE) 50 MCG/ACT nasal spray Place 2 sprays into both nostrils daily. 03/11/15  Yes Chesley Mires, MD  furosemide (LASIX) 40 MG tablet TAKE ONE TABLET BY MOUTH ONCE DAILY PRN swelling Patient taking differently:  Take 40 mg by mouth daily.  03/18/15  Yes Aleksei Plotnikov V, MD  loratadine (CLARITIN) 10 MG tablet Take 1 tablet (10 mg total) by mouth daily. 11/13/14  Yes Aleksei Plotnikov V, MD  losartan (COZAAR) 100 MG tablet Take 1 tablet (100 mg total) by mouth daily. 03/18/15  Yes Aleksei Plotnikov V, MD  lovastatin (MEVACOR) 40 MG tablet TAKE ONE TABLET BY MOUTH ONCE DAILY AT 6PM Patient taking differently: TAKE 40 MG BY MOUTH ONCE DAILY AT [facility] 05/14/15  Yes Aleksei Plotnikov V, MD  tamsulosin (FLOMAX) 0.4 MG CAPS capsule Take 1 capsule (0.4 mg total) by mouth daily. 09/15/14  Yes Arloa Koh, MD  aspirin 81 MG  tablet Take 81 mg by mouth daily.      Historical Provider, MD  Xylometazoline HCl (4-WAY NASAL SPRAY NA) Place 1 spray into the nose 2 (two) times daily as needed (congestion).    Historical Provider, MD   BP 138/103 mmHg  Pulse 94  Temp(Src) 98 F (36.7 C) (Oral)  Resp 20  SpO2 98% Physical Exam  Constitutional: He is oriented to person, place, and time. He appears well-developed and well-nourished. No distress.  HENT:  Head: Normocephalic and atraumatic.  Right Ear: Hearing normal.  Left Ear: Hearing normal.  Nose: Nose normal.  Mouth/Throat: Oropharynx is clear and moist and mucous membranes are normal.  Eyes: Conjunctivae and EOM are normal. Pupils are equal, round, and reactive to light.  Neck: Normal range of motion. Neck supple.  Cardiovascular: Regular rhythm, S1 normal and S2 normal.  Exam reveals no gallop and no friction rub.   No murmur heard. Pulmonary/Chest: Effort normal and breath sounds normal. No respiratory distress. He exhibits no tenderness.  Abdominal: Soft. Normal appearance and bowel sounds are normal. There is no hepatosplenomegaly. There is no tenderness. There is no rebound, no guarding, no tenderness at McBurney's point and negative Murphy's sign. No hernia.  Musculoskeletal: Normal range of motion.  Neurological: He is alert and oriented to person, place, and time. He has normal strength. No cranial nerve deficit or sensory deficit. Coordination normal. GCS eye subscore is 4. GCS verbal subscore is 5. GCS motor subscore is 6.  Skin: Skin is warm, dry and intact. No rash noted. No cyanosis.  Psychiatric: He has a normal mood and affect. His speech is normal and behavior is normal. Thought content normal.  Nursing note and vitals reviewed.   ED Course  Procedures (including critical care time)  DIAGNOSTIC STUDIES: Oxygen Saturation is 98% on RA, normal by my interpretation.    COORDINATION OF CARE: 11:08 PM Will replace foley catheter and discharge  home. Discussed treatment plan with pt at bedside and pt agreed to plan.   Labs Review Labs Reviewed - No data to display  Imaging Review No results found. I have personally reviewed and evaluated these images and lab results as part of my medical decision-making.   EKG Interpretation None      MDM   Final diagnoses:  None  Urinary retention  Patient originally seen several days ago for urinary retention and had Foley catheter placed. Patient had been doing well until tonight when he accidentally pulled on the Foley and a partially dislodged. He reports that it has not been draining well since then. Foley catheter was replaced and is draining well. Will initiate antibiotics empirically, send urine culture and have follow-up with urology.  I personally performed the services described in this documentation, which was scribed in my presence. The recorded  information has been reviewed and is accurate.    Orpah Greek, MD 06/11/15 [phone]

## 2015-06-11 NOTE — Discharge Instructions (Signed)
Acute Urinary Retention, Male °Acute urinary retention is the temporary inability to urinate. °This is a common problem in older men. As men age their prostates become larger and block the flow of urine from the bladder. This is usually a problem that has come on gradually.  °HOME CARE INSTRUCTIONS °If you are sent home with a Foley catheter and a drainage system, you will need to discuss the best course of action with your health care provider. While the catheter is in, maintain a good intake of fluids. Keep the drainage bag emptied and lower than your catheter. This is so that contaminated urine will not flow back into your bladder, which could lead to a urinary tract infection. °There are two main types of drainage bags. One is a large bag that usually is used at night. It has a good capacity that will allow you to sleep through the night without having to empty it. The second type is called a leg bag. It has a smaller capacity, so it needs to be emptied more frequently. However, the main advantage is that it can be attached by a leg strap and can go underneath your clothing, allowing you the freedom to move about or leave your home. °Only take over-the-counter or prescription medicines for pain, discomfort, or fever as directed by your health care provider.  °SEEK MEDICAL CARE IF: °· You develop a low-grade fever. °· You experience spasms or leakage of urine with the spasms. °SEEK IMMEDIATE MEDICAL CARE IF:  °· You develop chills or fever. °· Your catheter stops draining urine. °· Your catheter falls out. °· You start to develop increased bleeding that does not respond to rest and increased fluid intake. °MAKE SURE YOU: °· Understand these instructions. °· Will watch your condition. °· Will get help right away if you are not doing well or get worse. °  °This information is not intended to replace advice given to you by your health care provider. Make sure you discuss any questions you have with your health care  provider. °  °Document Released: 04/18/2000 Document Revised: 05/27/2014 Document Reviewed: 06/21/2012 °Elsevier Interactive Patient Education ©2016 [employer] ° °

## 2015-06-11 NOTE — ED Notes (Signed)
Pt sent home with catheter in place and leg bag.

## 2015-06-14 LAB — URINE CULTURE

## 2015-06-15 ENCOUNTER — Telehealth (HOSPITAL_BASED_OUTPATIENT_CLINIC_OR_DEPARTMENT_OTHER): Payer: Self-pay | Admitting: Emergency Medicine

## 2015-06-15 NOTE — Telephone Encounter (Signed)
Post ED Visit - Positive Culture Follow-up  Culture report reviewed by antimicrobial stewardship pharmacist:  []  Elenor Quinones, Pharm.D. []  Heide Guile, Pharm.D., BCPS []  Parks Neptune, Pharm.D. []  Alycia Rossetti, Pharm.D., BCPS []  [place], Pharm.D., BCPS, AAHIVP []  Legrand Como, Pharm.D., BCPS, AAHIVP [x]  Milus Glazier, Pharm.D. []  Stephens November, [place].D.  Positive urine culture Treated with cipro, organism sensitive to the same and no further patient follow-up is required at this time.  Hazle Nordmann 06/15/2015, 11:05 AM

## 2015-06-18 ENCOUNTER — Encounter: Payer: Self-pay | Admitting: Internal Medicine

## 2015-06-18 ENCOUNTER — Other Ambulatory Visit (INDEPENDENT_AMBULATORY_CARE_PROVIDER_SITE_OTHER): Payer: Medicare Other

## 2015-06-18 ENCOUNTER — Ambulatory Visit (INDEPENDENT_AMBULATORY_CARE_PROVIDER_SITE_OTHER): Payer: Medicare Other | Admitting: Internal Medicine

## 2015-06-18 VITALS — BP 134/70 | HR 75 | Wt 245.0 lb

## 2015-06-18 DIAGNOSIS — I10 Essential (primary) hypertension: Secondary | ICD-10-CM

## 2015-06-18 DIAGNOSIS — C61 Malignant neoplasm of prostate: Secondary | ICD-10-CM | POA: Diagnosis not present

## 2015-06-18 DIAGNOSIS — I499 Cardiac arrhythmia, unspecified: Secondary | ICD-10-CM | POA: Diagnosis not present

## 2015-06-18 DIAGNOSIS — N320 Bladder-neck obstruction: Secondary | ICD-10-CM

## 2015-06-18 DIAGNOSIS — I491 Atrial premature depolarization: Secondary | ICD-10-CM

## 2015-06-18 LAB — BASIC METABOLIC PANEL
BUN: 11 mg/dL (ref 6–23)
CO2: 30 mEq/L (ref 19–32)
Calcium: 9.2 mg/dL (ref 8.4–10.5)
Chloride: 106 mEq/L (ref 96–112)
Creatinine, Ser: 1.04 mg/dL (ref 0.40–1.50)
GFR: 90.01 mL/min (ref >60.00)
Glucose, Bld: 99 mg/dL (ref 70–99)
Potassium: 3.8 mEq/L (ref 3.5–5.1)
Sodium: 140 mEq/L (ref 135–145)

## 2015-06-18 LAB — CBC WITH DIFFERENTIAL/PLATELET
Basophils Absolute: 0 10*3/uL (ref 0.0–0.1)
Basophils Relative: 0.5 % (ref 0.0–3.0)
Eosinophils Absolute: 0.3 10*3/uL (ref 0.0–0.7)
Eosinophils Relative: 4.2 % (ref 0.0–5.0)
HCT: 31.3 % — ABNORMAL LOW (ref 39.0–52.0)
Hemoglobin: 10.5 g/dL — ABNORMAL LOW (ref 13.0–17.0)
Lymphocytes Relative: 28.6 % (ref 12.0–46.0)
Lymphs Abs: 1.7 10*3/uL (ref 0.7–4.0)
MCHC: 33.7 g/dL (ref 30.0–36.0)
MCV: 79.7 fl (ref 78.0–100.0)
Monocytes Absolute: 0.9 10*3/uL (ref 0.1–1.0)
Monocytes Relative: 14.7 % — ABNORMAL HIGH (ref 3.0–12.0)
Neutro Abs: 3.1 10*3/uL (ref 1.4–7.7)
Neutrophils Relative %: 52 % (ref 43.0–77.0)
Platelets: 256 10*3/uL (ref 150.0–400.0)
RBC: 3.92 Mil/uL — ABNORMAL LOW (ref 4.22–5.81)
RDW: 14.4 % (ref 11.5–15.5)
WBC: 6 10*3/uL (ref 4.0–10.5)

## 2015-06-18 LAB — HEPATIC FUNCTION PANEL
ALBUMIN: 3.9 g/dL (ref 3.5–5.2)
ALT: 20 U/L (ref 0–53)
AST: 18 U/L (ref 0–37)
Alkaline Phosphatase: 81 U/L (ref 39–117)
BILIRUBIN DIRECT: 0.1 mg/dL (ref 0.0–0.3)
TOTAL PROTEIN: 6.6 g/dL (ref 6.0–8.3)
Total Bilirubin: 0.3 mg/dL (ref 0.2–1.2)

## 2015-06-18 NOTE — Assessment & Plan Note (Signed)
Foley is in

## 2015-06-18 NOTE — Assessment & Plan Note (Signed)
Amlodipine, Catapress, Furosemide, Losartan Labs

## 2015-06-18 NOTE — Progress Notes (Signed)
Pre visit review using our clinic review tool, if applicable. No additional management support is needed unless otherwise documented below in the visit note. 

## 2015-06-18 NOTE — Assessment & Plan Note (Signed)
EKG NSR, PACs

## 2015-06-18 NOTE — Progress Notes (Signed)
Subjective:  Patient ID: Jeffrey Carter, male    DOB: [birth year]  Age: 75 y.o. MRN: [id number]  CC: No chief complaint on file.   HPI Jeffrey Carter presents for urinary retention - has a Foley in now; prostate ca, HTN f/up.  Outpatient Prescriptions Prior to Visit  Medication Sig Dispense Refill  . amLODipine (NORVASC) 5 MG tablet TAKE ONE TABLET BY MOUTH ONCE DAILY (Patient taking differently: TAKE 5 MG BY MOUTH ONCE DAILY) 30 tablet 11  . aspirin 81 MG tablet Take 81 mg by mouth daily.      . ciprofloxacin (CIPRO) 500 MG tablet Take 1 tablet (500 mg total) by mouth 2 (two) times daily. One po bid x 7 days 14 tablet 0  . cloNIDine (CATAPRES) 0.3 MG tablet Take 1 tablet (0.3 mg total) by mouth 2 (two) times daily. 180 tablet 3  . finasteride (PROSCAR) 5 MG tablet Take 5 mg by mouth daily.     . fluticasone (FLONASE) 50 MCG/ACT nasal spray Place 2 sprays into both nostrils daily. 16 g 2  . furosemide (LASIX) 40 MG tablet TAKE ONE TABLET BY MOUTH ONCE DAILY PRN swelling (Patient taking differently: Take 40 mg by mouth daily. ) 90 tablet 1  . loratadine (CLARITIN) 10 MG tablet Take 1 tablet (10 mg total) by mouth daily. 100 tablet 3  . losartan (COZAAR) 100 MG tablet Take 1 tablet (100 mg total) by mouth daily. 90 tablet 3  . lovastatin (MEVACOR) 40 MG tablet TAKE ONE TABLET BY MOUTH ONCE DAILY AT 6PM (Patient taking differently: TAKE 40 MG BY MOUTH ONCE DAILY AT 6PM) 30 tablet 11  . tamsulosin (FLOMAX) 0.4 MG CAPS capsule Take 1 capsule (0.4 mg total) by mouth daily. 30 capsule 3  . Xylometazoline HCl (4-WAY NASAL SPRAY NA) Place 1 spray into the nose 2 (two) times daily as needed (congestion).     No facility-administered medications prior to visit.    ROS Review of Systems  Constitutional: Negative for appetite change, fatigue and unexpected weight change.  HENT: Negative for congestion, nosebleeds, sneezing, sore throat and trouble swallowing.   Eyes: Negative for itching and  visual disturbance.  Respiratory: Negative for cough.   Cardiovascular: Negative for chest pain, palpitations and leg swelling.  Gastrointestinal: Negative for nausea, diarrhea, blood in stool and abdominal distention.  Genitourinary: Positive for hematuria and decreased urine volume. Negative for frequency.  Musculoskeletal: Negative for back pain, joint swelling, gait problem and neck pain.  Skin: Negative for rash.  Neurological: Negative for dizziness, tremors, speech difficulty and weakness.  Psychiatric/Behavioral: Negative for sleep disturbance, dysphoric mood and agitation. The patient is not nervous/anxious.     Objective:  BP 134/70 mmHg  Pulse 75  Wt 245 lb (111.131 kg)  SpO2 96%  BP Readings from Last 3 Encounters:  06/18/15 134/70  06/11/15 138/103  06/08/15 147/75    Wt Readings from Last 3 Encounters:  06/18/15 245 lb (111.131 kg)  03/24/15 255 lb (115.667 kg)  03/18/15 244 lb (110.678 kg)    Physical Exam  Constitutional: He is oriented to person, place, and time. He appears well-developed. No distress.  NAD  HENT:  Mouth/Throat: Oropharynx is clear and moist.  Eyes: Conjunctivae are normal. Pupils are equal, round, and reactive to light.  Neck: Normal range of motion. No JVD present. No thyromegaly present.  Cardiovascular: Normal rate, normal heart sounds and intact distal pulses.  Exam reveals no gallop and no friction rub.   No  murmur heard. Pulmonary/Chest: Effort normal and breath sounds normal. No respiratory distress. He has no wheezes. He has no rales. He exhibits no tenderness.  Abdominal: Soft. Bowel sounds are normal. He exhibits no distension and no mass. There is no tenderness. There is no rebound and no guarding.  Musculoskeletal: Normal range of motion. He exhibits no edema or tenderness.  Lymphadenopathy:    He has no cervical adenopathy.  Neurological: He is alert and oriented to person, place, and time. He has normal reflexes. No cranial  nerve deficit. He exhibits normal muscle tone. He displays a negative Romberg sign. Coordination and gait normal.  Skin: Skin is warm and dry. No rash noted.  Psychiatric: He has a normal mood and affect. His behavior is normal. Judgment and thought content normal.  Foley is in - urine is clear yellow in the bag occ irreg beats   Procedure: EKG Indication: irreg beats Impression: NSR. PACs. No acute changes.   Lab Results  Component Value Date   WBC 5.4 01/17/2015   HGB 11.8* 01/17/2015   HCT 35.5* 01/17/2015   PLT 184 01/17/2015   GLUCOSE 143* 01/17/2015   CHOL 154 02/20/2013   TRIG 121.0 02/20/2013   HDL 48.60 02/20/2013   LDLCALC 81 02/20/2013   ALT 26 10/12/2014   AST 22 10/12/2014   NA 140 01/17/2015   K 3.1* 01/17/2015   CL 106 01/17/2015   CREATININE 1.01 01/17/2015   BUN 11 01/17/2015   CO2 24 01/17/2015   TSH 0.93 04/03/2013   PSA 5.25* 12/11/2013   INR 1.01 10/12/2014    No results found.  Assessment & Plan:   There are no diagnoses linked to this encounter. I am having Mr. Jankiewicz maintain his aspirin, tamsulosin, loratadine, finasteride, Xylometazoline HCl (4-WAY NASAL SPRAY NA), fluticasone, furosemide, losartan, cloNIDine, amLODipine, lovastatin, and ciprofloxacin.  No orders of the defined types were placed in this encounter.     Follow-up: No Follow-up on file.  [name], MD

## 2015-06-18 NOTE — Assessment & Plan Note (Signed)
In treatment

## 2015-06-23 DIAGNOSIS — G4733 Obstructive sleep apnea (adult) (pediatric): Secondary | ICD-10-CM | POA: Diagnosis not present

## 2015-06-30 DIAGNOSIS — C61 Malignant neoplasm of prostate: Secondary | ICD-10-CM | POA: Diagnosis not present

## 2015-07-01 ENCOUNTER — Other Ambulatory Visit: Payer: Self-pay | Admitting: Urology

## 2015-07-02 ENCOUNTER — Ambulatory Visit (INDEPENDENT_AMBULATORY_CARE_PROVIDER_SITE_OTHER): Payer: Medicare Other | Admitting: Family

## 2015-07-02 ENCOUNTER — Encounter: Payer: Self-pay | Admitting: Family

## 2015-07-02 VITALS — BP 148/90 | HR 66 | Temp 97.8°F | Ht 69.0 in | Wt 237.0 lb

## 2015-07-02 DIAGNOSIS — H1033 Unspecified acute conjunctivitis, bilateral: Secondary | ICD-10-CM

## 2015-07-02 DIAGNOSIS — H10023 Other mucopurulent conjunctivitis, bilateral: Secondary | ICD-10-CM | POA: Diagnosis not present

## 2015-07-02 MED ORDER — CIPROFLOXACIN HCL 0.3 % OP SOLN: OPHTHALMIC | Status: DC

## 2015-07-02 NOTE — Progress Notes (Signed)
Subjective:    Patient ID: Jeffrey Carter, male    DOB: 02/01/1940, 75 y.o.   MRN: [id number]   Jeffrey Carter is a 75 y.o. male who presents today for an acute visit.    HPI Comments: Here for evaluation of eye itching, thick-clear drainage, and redness x 2 days. No eye pain, photophobia, foreign body sensation, or vision changes. No sinus or ear pain. OTC red eye drops with no relief.   Has allergies 'all the time.' Uses flonase.   Past Medical History  Diagnosis Date  . Hypertension   . Osteoarthritis   . Hyperlipidemia   . BPH (benign prostatic hyperplasia)   . Lower urinary tract symptoms (LUTS)   . History of gout     BIG TOE  . OSA on CPAP     MODERATE PER STUDY 03-08-2012  . Wears glasses   . First degree heart block   . Prostate cancer [facility]) urologist-  dr nesi/  oncologist-  dr Ander Slade    Stage T1c,  Gleason 3+4,  PSA 10.57,  vol 115ml  . History of urinary retention 12/2014   Allergies: Review of patient's allergies indicates no known allergies. Current Outpatient Prescriptions on File Prior to Visit  Medication Sig Dispense Refill  . amLODipine (NORVASC) 5 MG tablet TAKE ONE TABLET BY MOUTH ONCE DAILY (Patient taking differently: TAKE 5 MG BY MOUTH ONCE DAILY) 30 tablet 11  . aspirin 81 MG tablet Take 81 mg by mouth daily.      . cloNIDine (CATAPRES) 0.3 MG tablet Take 1 tablet (0.3 mg total) by mouth 2 (two) times daily. 180 tablet 3  . finasteride (PROSCAR) 5 MG tablet Take 5 mg by mouth daily.     . fluticasone (FLONASE) 50 MCG/ACT nasal spray Place 2 sprays into both nostrils daily. 16 g 2  . furosemide (LASIX) 40 MG tablet TAKE ONE TABLET BY MOUTH ONCE DAILY PRN swelling (Patient taking differently: Take 40 mg by mouth daily. ) 90 tablet 1  . loratadine (CLARITIN) 10 MG tablet Take 1 tablet (10 mg total) by mouth daily. 100 tablet 3  . losartan (COZAAR) 100 MG tablet Take 1 tablet (100 mg total) by mouth daily. 90 tablet 3  . lovastatin (MEVACOR) 40 MG  tablet TAKE ONE TABLET BY MOUTH ONCE DAILY AT 6PM (Patient taking differently: TAKE 40 MG BY MOUTH ONCE DAILY AT 6PM) 30 tablet 11  . tamsulosin (FLOMAX) 0.4 MG CAPS capsule Take 1 capsule (0.4 mg total) by mouth daily. 30 capsule 3  . Xylometazoline HCl (4-WAY NASAL SPRAY NA) Place 1 spray into the nose 2 (two) times daily as needed (congestion).     No current facility-administered medications on file prior to visit.    Social History  Substance Use Topics  . Smoking status: Former Smoker -- 0.25 packs/day for 8 years    Types: Cigarettes    Quit date: 01/24/1962  . Smokeless tobacco: Never Used  . Alcohol Use: No    Review of Systems  Constitutional: Negative for fever and chills.  HENT: Negative for congestion, ear pain and sinus pressure.   Eyes: Positive for discharge, redness and itching (clear runny; some white discharge corner of eye). Negative for photophobia, pain and visual disturbance.  Respiratory: Negative for cough.   Cardiovascular: Negative for chest pain and palpitations.  Gastrointestinal: Negative for nausea and vomiting.  Neurological: Negative for dizziness and headaches.      Objective:    BP 148/90 mmHg  Pulse 66  Temp(Src) 97.8 F (36.6 C) (Oral)  Ht 5\' 9"  (1.753 m)  Wt 237 lb (107.502 kg)  BMI 34.98 kg/m2  SpO2 98%   Physical Exam  Constitutional: He appears well-developed and well-nourished.  HENT:  Head: Normocephalic and atraumatic.  Right Ear: Hearing, tympanic membrane, external ear and ear canal normal. No drainage, swelling or tenderness. Tympanic membrane is not injected, not erythematous and not bulging. No middle ear effusion. No decreased hearing is noted.  Left Ear: Hearing, tympanic membrane, external ear and ear canal normal. No drainage, swelling or tenderness. Tympanic membrane is not injected, not erythematous and not bulging.  No middle ear effusion. No decreased hearing is noted.  Nose: Nose normal. Right sinus exhibits no  maxillary sinus tenderness and no frontal sinus tenderness. Left sinus exhibits no maxillary sinus tenderness and no frontal sinus tenderness.  Mouth/Throat: Uvula is midline, oropharynx is clear and moist and mucous membranes are normal. No oropharyngeal exudate, posterior oropharyngeal edema, posterior oropharyngeal erythema or tonsillar abscesses.  Eyes: EOM are normal. Pupils are equal, round, and reactive to light. Lids are everted and swept, no foreign bodies found. Right eye exhibits discharge. Left eye exhibits discharge. Right conjunctiva is injected. Right conjunctiva has no hemorrhage. Left conjunctiva is injected. Left conjunctiva has no hemorrhage.  No external eye lesions. Surrounding skin intact.   Left and right eye eye:   Diffuse injection of the conjunctiva. White to pale yellow discharge noted on bilateral eye lashes. No white spots, opacity, or foreign body appreciated. No collection of blood or pus in the anterior chamber. No ciliary flush surrounding iris.   No photophobia or eye pain appreciated during exam.     Cardiovascular: Regular rhythm and normal heart sounds.   Pulmonary/Chest: Effort normal and breath sounds normal. No respiratory distress. He has no wheezes. He has no rhonchi. He has no rales.  Lymphadenopathy:       Head (right side): No submental, no submandibular, no tonsillar, no preauricular, no posterior auricular and no occipital adenopathy present.       Head (left side): No submental, no submandibular, no tonsillar, no preauricular, no posterior auricular and no occipital adenopathy present.    He has no cervical adenopathy.       Right cervical: No superficial cervical, no deep cervical and no posterior cervical adenopathy present.      Left cervical: No superficial cervical, no deep cervical and no posterior cervical adenopathy present.  Neurological: He is alert.  Skin: Skin is warm and dry.  Psychiatric: He has a normal mood and affect. His speech  is normal and behavior is normal.  Vitals reviewed.      Assessment & Plan:   1. Acute bacterial conjunctivitis of both eyes  - ciprofloxacin (CILOXAN) 0.3 % ophthalmic solution; 1-2 gtt in eye(s) q2h while awake x 2 days, then q4h x 5 days.  Dispense: 5 mL; Refill: 0    I have discontinued Mr. Dudak ciprofloxacin. I am also having him maintain his aspirin, tamsulosin, loratadine, finasteride, Xylometazoline HCl (4-WAY NASAL SPRAY NA), fluticasone, furosemide, losartan, cloNIDine, amLODipine, and lovastatin.   No orders of the defined types were placed in this encounter.     Start medications as prescribed and explained to patient on After Visit Summary ( AVS). Risks, benefits, and alternatives of the medications and treatment plan prescribed today were discussed, and patient expressed understanding.   Education regarding symptom management and diagnosis given to patient.   Follow-up:Plan follow-up and return  precautions given if any worsening symptoms or change in condition- notably if eye pain, changes in vision, sensitivity to light presents, patient needs to be seen in ED.  Continue to follow with [name], MD for routine health maintenance.   Lucretia Field and I agreed with plan.   Mable Paris, FNP

## 2015-07-02 NOTE — Patient Instructions (Addendum)
If eye complaints do not complete improvement on antibiotics please let us know.   If there is no improvement in your symptoms, or if there is any worsening of symptoms, or if you have any additional concerns, please return for re-evaluation; or, if we are closed, consider going to the Emergency Room for evaluation if symptoms urgent.    Bacterial Conjunctivitis Bacterial conjunctivitis, commonly called pink eye, is an inflammation of the clear membrane that covers the white part of the eye (conjunctiva). The inflammation can also happen on the underside of the eyelids. The blood vessels in the conjunctiva become inflamed, causing the eye to become red or pink. Bacterial conjunctivitis may spread easily from one eye to another and from person to person (contagious).  CAUSES  Bacterial conjunctivitis is caused by bacteria. The bacteria may come from your own skin, your upper respiratory tract, or from someone else with bacterial conjunctivitis. SYMPTOMS  The normally white color of the eye or the underside of the eyelid is usually pink or red. The pink eye is usually associated with irritation, tearing, and some sensitivity to light. Bacterial conjunctivitis is often associated with a thick, yellowish discharge from the eye. The discharge may turn into a crust on the eyelids overnight, which causes your eyelids to stick together. If a discharge is present, there may also be some blurred vision in the affected eye. DIAGNOSIS  Bacterial conjunctivitis is diagnosed by your caregiver through an eye exam and the symptoms that you report. Your caregiver looks for changes in the surface tissues of your eyes, which may point to the specific type of conjunctivitis. A sample of any discharge may be collected on a cotton-tip swab if you have a severe case of conjunctivitis, if your cornea is affected, or if you keep getting repeat infections that do not respond to treatment. The sample will be sent to a lab to see  if the inflammation is caused by a bacterial infection and to see if the infection will respond to antibiotic medicines. TREATMENT   Bacterial conjunctivitis is treated with antibiotics. Antibiotic eyedrops are most often used. However, antibiotic ointments are also available. Antibiotics pills are sometimes used. Artificial tears or eye washes may ease discomfort. HOME CARE INSTRUCTIONS   To ease discomfort, apply a cool, clean washcloth to your eye for 10-20 minutes, 3-4 times a day.  Gently wipe away any drainage from your eye with a warm, wet washcloth or a cotton ball.  Wash your hands often with soap and water. Use paper towels to dry your hands.  Do not share towels or washcloths. This may spread the infection.  Change or wash your pillowcase every day.  You should not use eye makeup until the infection is gone.  Do not operate machinery or drive if your vision is blurred.  Stop using contact lenses. Ask your caregiver how to sterilize or replace your contacts before using them again. This depends on the type of contact lenses that you use.  When applying medicine to the infected eye, do not touch the edge of your eyelid with the eyedrop bottle or ointment tube. SEEK IMMEDIATE MEDICAL CARE IF:   Your infection has not improved within 3 days after beginning treatment.  You had yellow discharge from your eye and it returns.  You have increased eye pain.  Your eye redness is spreading.  Your vision becomes blurred.  You have a fever or persistent symptoms for more than 2-3 days.  You have a fever and your  symptoms suddenly get worse.  You have facial pain, redness, or swelling. MAKE SURE YOU:   Understand these instructions.  Will watch your condition.  Will get help right away if you are not doing well or get worse.   This information is not intended to replace advice given to you by your health care provider. Make sure you discuss any questions you have with your  health care provider.   Document Released: 01/10/2005 Document Revised: 01/31/2014 Document Reviewed: 06/13/2011 Elsevier Interactive Patient Education [employer].

## 2015-07-02 NOTE — Progress Notes (Signed)
Pre visit review using our clinic review tool, if applicable. No additional management support is needed unless otherwise documented below in the visit note. 

## 2015-07-13 ENCOUNTER — Encounter (HOSPITAL_BASED_OUTPATIENT_CLINIC_OR_DEPARTMENT_OTHER): Payer: Self-pay | Admitting: *Deleted

## 2015-07-15 ENCOUNTER — Encounter (HOSPITAL_BASED_OUTPATIENT_CLINIC_OR_DEPARTMENT_OTHER): Payer: Self-pay | Admitting: *Deleted

## 2015-07-15 NOTE — Progress Notes (Signed)
NPO AFTER MN.  ARRIVE AT 1115.  NEEDS ISTAT 8.  CURRENT EKG IN CHART AND EPIC.  WILL TAKE AM MEDS W/ SIPS OF WATER DOS.

## 2015-07-20 ENCOUNTER — Ambulatory Visit (HOSPITAL_BASED_OUTPATIENT_CLINIC_OR_DEPARTMENT_OTHER): Payer: Medicare Other | Admitting: Anesthesiology

## 2015-07-20 ENCOUNTER — Encounter (HOSPITAL_BASED_OUTPATIENT_CLINIC_OR_DEPARTMENT_OTHER): Payer: Self-pay | Admitting: *Deleted

## 2015-07-20 ENCOUNTER — Encounter (HOSPITAL_BASED_OUTPATIENT_CLINIC_OR_DEPARTMENT_OTHER)
Admission: RE | Disposition: A | Discharge status: Home / Self Care | Payer: Self-pay | Source: Ambulatory Visit | Attending: Urology

## 2015-07-20 ENCOUNTER — Ambulatory Visit (HOSPITAL_BASED_OUTPATIENT_CLINIC_OR_DEPARTMENT_OTHER)
Admission: RE | Admit: 2015-07-20 | Discharge: 2015-07-20 | Disposition: A | Discharge status: Home / Self Care | Payer: Medicare Other | Source: Ambulatory Visit | Attending: Urology | Admitting: Urology

## 2015-07-20 DIAGNOSIS — Z923 Personal history of irradiation: Secondary | ICD-10-CM | POA: Diagnosis not present

## 2015-07-20 DIAGNOSIS — G4730 Sleep apnea, unspecified: Secondary | ICD-10-CM | POA: Insufficient documentation

## 2015-07-20 DIAGNOSIS — N401 Enlarged prostate with lower urinary tract symptoms: Secondary | ICD-10-CM | POA: Insufficient documentation

## 2015-07-20 DIAGNOSIS — R338 Other retention of urine: Secondary | ICD-10-CM | POA: Insufficient documentation

## 2015-07-20 DIAGNOSIS — I10 Essential (primary) hypertension: Secondary | ICD-10-CM | POA: Diagnosis not present

## 2015-07-20 DIAGNOSIS — Z7982 Long term (current) use of aspirin: Secondary | ICD-10-CM | POA: Insufficient documentation

## 2015-07-20 DIAGNOSIS — E7800 Pure hypercholesterolemia, unspecified: Secondary | ICD-10-CM | POA: Diagnosis not present

## 2015-07-20 DIAGNOSIS — Z87891 Personal history of nicotine dependence: Secondary | ICD-10-CM | POA: Insufficient documentation

## 2015-07-20 DIAGNOSIS — Z79899 Other long term (current) drug therapy: Secondary | ICD-10-CM | POA: Diagnosis not present

## 2015-07-20 DIAGNOSIS — N400 Enlarged prostate without lower urinary tract symptoms: Secondary | ICD-10-CM | POA: Diagnosis not present

## 2015-07-20 DIAGNOSIS — C61 Malignant neoplasm of prostate: Secondary | ICD-10-CM | POA: Insufficient documentation

## 2015-07-20 DIAGNOSIS — N320 Bladder-neck obstruction: Secondary | ICD-10-CM

## 2015-07-20 HISTORY — DX: Unspecified acute conjunctivitis, bilateral: H10.33

## 2015-07-20 HISTORY — DX: Personal history of irradiation: Z92.3

## 2015-07-20 HISTORY — DX: Atrial premature depolarization: I49.1

## 2015-07-20 HISTORY — DX: Personal history of other diseases of the nervous system and sense organs: Z86.69

## 2015-07-20 HISTORY — PX: GREEN LIGHT LASER TURP (TRANSURETHRAL RESECTION OF PROSTATE: SHX6260

## 2015-07-20 HISTORY — DX: Bladder-neck obstruction: N32.0

## 2015-07-20 LAB — POCT I-STAT, CHEM 8
BUN: 10 mg/dL (ref 6–20)
Calcium, Ion: 1.25 mmol/L (ref 1.13–1.30)
Chloride: 102 mmol/L (ref 101–111)
Creatinine, Ser: 1 mg/dL (ref 0.61–1.24)
Glucose, Bld: 120 mg/dL — ABNORMAL HIGH (ref 65–99)
HEMATOCRIT: 38 % — AB (ref 39.0–52.0)
HEMOGLOBIN: 12.9 g/dL — AB (ref 13.0–17.0)
Potassium: 3.5 mmol/L (ref 3.5–5.1)
SODIUM: 141 mmol/L (ref 135–145)
TCO2: 28 mmol/L (ref 0–100)

## 2015-07-20 SURGERY — GREEN LIGHT LASER TURP (TRANSURETHRAL RESECTION OF PROSTATE
Anesthesia: General | Site: Prostate

## 2015-07-20 MED ORDER — PROPOFOL 10 MG/ML IV BOLUS
INTRAVENOUS | Status: DC | PRN
  Administered 2015-07-20 (×3): 50 mg via INTRAVENOUS
  Administered 2015-07-20: 200 mg via INTRAVENOUS
  Administered 2015-07-20: 20 mg via INTRAVENOUS

## 2015-07-20 MED ORDER — KETOROLAC TROMETHAMINE 30 MG/ML IJ SOLN
INTRAMUSCULAR | Status: AC
  Filled 2015-07-20: qty 1

## 2015-07-20 MED ORDER — FENTANYL CITRATE (PF) 100 MCG/2ML IJ SOLN
INTRAMUSCULAR | Status: AC
  Filled 2015-07-20: qty 2

## 2015-07-20 MED ORDER — PROMETHAZINE HCL 25 MG/ML IJ SOLN
6.2500 mg | INTRAMUSCULAR | Status: DC | PRN
Start: 2015-07-20 — End: 2015-07-20
  Filled 2015-07-20: qty 1

## 2015-07-20 MED ORDER — HYDROMORPHONE HCL 1 MG/ML IJ SOLN
INTRAMUSCULAR | Status: AC
  Filled 2015-07-20: qty 1

## 2015-07-20 MED ORDER — ACETAMINOPHEN 10 MG/ML IV SOLN
INTRAVENOUS | Status: AC
  Filled 2015-07-20: qty 100

## 2015-07-20 MED ORDER — HYDRALAZINE HCL 20 MG/ML IJ SOLN
5.0000 mg | INTRAMUSCULAR | Status: AC | PRN
  Administered 2015-07-20 (×2): 5 mg via INTRAVENOUS
  Filled 2015-07-20: qty 0.25

## 2015-07-20 MED ORDER — PHENYLEPHRINE 40 MCG/ML (10ML) SYRINGE FOR IV PUSH (FOR BLOOD PRESSURE SUPPORT)
PREFILLED_SYRINGE | INTRAVENOUS | Status: AC
  Filled 2015-07-20: qty 10

## 2015-07-20 MED ORDER — ONDANSETRON HCL 4 MG/2ML IJ SOLN
INTRAMUSCULAR | Status: AC
  Filled 2015-07-20: qty 2

## 2015-07-20 MED ORDER — BELLADONNA ALKALOIDS-OPIUM 16.2-60 MG RE SUPP
RECTAL | Status: AC
  Filled 2015-07-20: qty 1

## 2015-07-20 MED ORDER — DEXAMETHASONE SODIUM PHOSPHATE 10 MG/ML IJ SOLN
INTRAMUSCULAR | Status: AC
  Filled 2015-07-20: qty 1

## 2015-07-20 MED ORDER — PROPOFOL 10 MG/ML IV BOLUS
INTRAVENOUS | Status: AC
  Filled 2015-07-20: qty 40

## 2015-07-20 MED ORDER — CEPHALEXIN 500 MG PO CAPS: 500.0000 mg | ORAL_CAPSULE | Freq: Three times a day (TID) | ORAL | Status: DC

## 2015-07-20 MED ORDER — KETOROLAC TROMETHAMINE 30 MG/ML IJ SOLN
15.0000 mg | Freq: Once | INTRAMUSCULAR | Status: DC | PRN
  Filled 2015-07-20: qty 1

## 2015-07-20 MED ORDER — EPHEDRINE SULFATE 50 MG/ML IJ SOLN
INTRAMUSCULAR | Status: AC
  Filled 2015-07-20: qty 1

## 2015-07-20 MED ORDER — ONDANSETRON HCL 4 MG/2ML IJ SOLN
INTRAMUSCULAR | Status: DC | PRN
  Administered 2015-07-20: 4 mg via INTRAVENOUS

## 2015-07-20 MED ORDER — LIDOCAINE HCL (CARDIAC) 20 MG/ML IV SOLN
INTRAVENOUS | Status: AC
  Filled 2015-07-20: qty 5

## 2015-07-20 MED ORDER — EPHEDRINE SULFATE 50 MG/ML IJ SOLN
INTRAMUSCULAR | Status: DC | PRN
  Administered 2015-07-20: 10 mg via INTRAVENOUS

## 2015-07-20 MED ORDER — SODIUM CHLORIDE 0.9 % IV SOLN
2.0000 g | INTRAVENOUS | Status: AC
  Administered 2015-07-20: 2 g via INTRAVENOUS
  Filled 2015-07-20 (×2): qty 2000

## 2015-07-20 MED ORDER — HYDROCODONE-ACETAMINOPHEN 5-325 MG PO TABS
1.0000 | ORAL_TABLET | Freq: Four times a day (QID) | ORAL | Status: DC | PRN
Start: 2015-07-20 — End: 2016-01-11

## 2015-07-20 MED ORDER — DEXTROSE 5 % IV SOLN
5.0000 mg/kg | Freq: Once | INTRAVENOUS | Status: AC
  Administered 2015-07-20: 420 mg via INTRAVENOUS
  Filled 2015-07-20: qty 10.5

## 2015-07-20 MED ORDER — ACETAMINOPHEN 10 MG/ML IV SOLN
INTRAVENOUS | Status: DC | PRN
  Administered 2015-07-20: 1000 mg via INTRAVENOUS

## 2015-07-20 MED ORDER — PHENYLEPHRINE 40 MCG/ML (10ML) SYRINGE FOR IV PUSH (FOR BLOOD PRESSURE SUPPORT)
PREFILLED_SYRINGE | INTRAVENOUS | Status: DC | PRN
  Administered 2015-07-20 (×2): 80 ug via INTRAVENOUS

## 2015-07-20 MED ORDER — HYDRALAZINE HCL 20 MG/ML IJ SOLN
INTRAMUSCULAR | Status: AC
  Filled 2015-07-20: qty 1

## 2015-07-20 MED ORDER — LIDOCAINE HCL (CARDIAC) 20 MG/ML IV SOLN
INTRAVENOUS | Status: DC | PRN
  Administered 2015-07-20: 100 mg via INTRAVENOUS

## 2015-07-20 MED ORDER — LACTATED RINGERS IV SOLN
INTRAVENOUS | Status: DC
  Administered 2015-07-20 (×3): via INTRAVENOUS
  Filled 2015-07-20: qty 1000

## 2015-07-20 MED ORDER — GENTAMICIN SULFATE 40 MG/ML IJ SOLN
1.5000 mg/kg | INTRAVENOUS | Status: DC
  Filled 2015-07-20: qty 4.25

## 2015-07-20 MED ORDER — DEXAMETHASONE SODIUM PHOSPHATE 4 MG/ML IJ SOLN
INTRAMUSCULAR | Status: DC | PRN
  Administered 2015-07-20: 10 mg via INTRAVENOUS

## 2015-07-20 MED ORDER — FENTANYL CITRATE (PF) 100 MCG/2ML IJ SOLN
INTRAMUSCULAR | Status: DC | PRN
  Administered 2015-07-20 (×4): 25 ug via INTRAVENOUS
  Administered 2015-07-20: 50 ug via INTRAVENOUS
  Administered 2015-07-20 (×2): 25 ug via INTRAVENOUS
  Administered 2015-07-20 (×2): 50 ug via INTRAVENOUS

## 2015-07-20 MED ORDER — SODIUM CHLORIDE 0.9 % IR SOLN
Status: DC | PRN
  Administered 2015-07-20 (×2): 3000 mL
  Administered 2015-07-20: 1000 mL
  Administered 2015-07-20 (×2): 6000 mL
  Administered 2015-07-20: 3000 mL

## 2015-07-20 MED ORDER — HYDROMORPHONE HCL 1 MG/ML IJ SOLN
0.2500 mg | INTRAMUSCULAR | Status: DC | PRN
  Administered 2015-07-20 (×2): 0.5 mg via INTRAVENOUS
  Filled 2015-07-20: qty 1

## 2015-07-20 SURGICAL SUPPLY — 29 items
BAG DRAIN URO-CYSTO SKYTR STRL (DRAIN) ×3 IMPLANT
BAG DRN UROCATH (DRAIN) ×1
BAG URINE DRAINAGE (UROLOGICAL SUPPLIES) ×3 IMPLANT
CATH FOLEY 2WAY SLVR  5CC 18FR (CATHETERS)
CATH FOLEY 2WAY SLVR  5CC 20FR (CATHETERS) ×2
CATH FOLEY 2WAY SLVR 5CC 18FR (CATHETERS) IMPLANT
CATH FOLEY 2WAY SLVR 5CC 20FR (CATHETERS) IMPLANT
CLOTH BEACON ORANGE TIMEOUT ST (SAFETY) ×3 IMPLANT
ELECT BIVAP BIPO 22/24 DONUT (ELECTROSURGICAL)
ELECT LOOP MED HF 24F 12D (CUTTING LOOP) IMPLANT
ELECTRD BIVAP BIPO 22/24 DONUT (ELECTROSURGICAL) IMPLANT
GLOVE BIO SURGEON STRL SZ7.5 (GLOVE) ×3 IMPLANT
GOWN STRL REUS W/ TWL XL LVL3 (GOWN DISPOSABLE) ×1 IMPLANT
GOWN STRL REUS W/TWL XL LVL3 (GOWN DISPOSABLE) ×3
HOLDER FOLEY CATH W/STRAP (MISCELLANEOUS) IMPLANT
IV NS 1000ML (IV SOLUTION) ×3
IV NS 1000ML BAXH (IV SOLUTION) ×1 IMPLANT
IV NS IRRIG 3000ML ARTHROMATIC (IV SOLUTION) ×24 IMPLANT
IV SET EXTENSION GRAVITY 40 LF (IV SETS) ×3 IMPLANT
KIT ROOM TURNOVER WOR (KITS) ×3 IMPLANT
LASER FIBER /GREENLIGHT LASER (Laser) ×6 IMPLANT
LASER GREENLIGHT RENTAL P/PROC (Laser) ×3 IMPLANT
LOOP CUT BIPOLAR 24F LRG (ELECTROSURGICAL) IMPLANT
MANIFOLD NEPTUNE II (INSTRUMENTS) ×3 IMPLANT
PACK CYSTO (CUSTOM PROCEDURE TRAY) ×3 IMPLANT
SYR 30ML LL (SYRINGE) ×3 IMPLANT
SYRINGE IRR TOOMEY STRL 70CC (SYRINGE) IMPLANT
TUBE CONNECTING 12'X1/4 (SUCTIONS) ×1
TUBE CONNECTING 12X1/4 (SUCTIONS) ×1 IMPLANT

## 2015-07-20 NOTE — H&P (Signed)
CC: I have prostate cancer.  HPI: Jeffrey Carter is a 75 year-old male established patient who is here evaluation for treatment of prostate cancer.  The patient is a 75 y.o. male with Gleason 3+4 in 2 cores at the left mid gland. PSA is 10.57. He is on finasteride and corrected PSA is 21.14. He underwent IMRT with short term ADT. His last lupron shot was in August 2016. He completed IMRT in September 2016. In March 2017, his PSA was 0.03. In May 2017, his PSA was 0.02.   His most recent PSA is .02.   He has not undergone surgery for treatment. He has undergone External Beam Radiation Therapy for treatment.     CC: BPH  HPI: The patient was then multiple bouts of urinary retention since his radiation therapy. Findings ranged from 300-800 cc PVRs. He currently has a catheter. Cystoscopy showed visually obstructive prostate.   He underwent urodynamic studies in April 2017 which showed a obstructive flow pattern. His maximum flow rate was 8 mL's per second. He had a postvoid residual of 50. The detrusor pressure at maximum flow was 110 cm of water. Overall he had high pressures with obstructive flow pattern. Cystoscopy shows visually obstructive prostate.   Patient is currently treated with Flomax/finasteride for his symptoms.     ALLERGIES:     MEDICATIONS: Afrin Allergy SOLN Nasal  AmLODIPine Besylate 5 MG Oral Tablet Oral  Aspirin 81 MG TABS Oral  CloNIDine HCl - 0.2 MG Oral Tablet Oral  Finasteride 5 MG Oral Tablet 0 Oral Daily  Furosemide 40 MG Oral Tablet Oral  Indomethacin 50 MG Oral Capsule Oral  Losartan Potassium 100 MG Oral Tablet Oral  Lovastatin 40 MG Oral Tablet Oral  Neomycin-Polymyxin-HC 3.5-10000-1 Otic Solution Otic  Sildenafil Citrate 20 MG Oral Tablet Oral  Tamsulosin HCl - 0.4 MG Oral Capsule 1 Oral  Vitamin D TABS Oral     GU PSH: Prostate Needle Biopsy - 04/07/2014 TRANSPERI NEEDLE PLACE, PROS - 08/20/2014      PSH Notes: Surgery Prostate Transperineal  Placement Of Needles, Biopsy Of The Prostate Needle, Appendectomy, Knee Surgery   NON-GU PSH: Appendectomy - 02/03/2014    GU PMH: Gross hematuria, Gross hematuria - 06/10/2015 Prostate Cancer, Adenocarcinoma of prostate - 06/10/2015 BPH w/LUTS, BPH (benign prostatic hypertrophy) with urinary retention - 06/05/2015, Benign localized prostatic hyperplasia with lower urinary tract symptoms (LUTS), - 05/26/2015, BPH (benign prostatic hypertrophy) with urinary retention, - 02/09/2015 Urinary Tract Inf, Unspec site, Urinary tract infection - 05/22/2015 Poor urinary stream, Weak urinary stream - 03/09/2015 Straining to void, Straining on urination - 03/09/2015 Elevated prostate specific antigen [PSA], Elevated prostate specific antigen (PSA) - 03/19/2014 Nocturia, Nocturia - 02/03/2014 Splitting of Stream, Intermittent urinary stream - 02/03/2014    NON-GU PMH: Encounter for general adult medical examination without abnormal findings, Encounter for preventive health examination - 0000000 Other complications following infusion, transfusion and therapeutic injection, initial encounter, Hematoma of injection site - 05/29/2014 Personal history of other diseases of the circulatory system, History of hypertension - 02/03/2014 Personal history of other diseases of the musculoskeletal system and connective tissue, History of gout - 02/03/2014 Personal history of other endocrine, nutritional and metabolic disease, History of hypercholesterolemia - 02/03/2014    FAMILY HISTORY: None   SOCIAL HISTORY: Marital Status: Married     Notes: Smoker, current status unknown, Number of children, Father deceased, Alcohol use, Mother deceased, Caffeine use, Retired, History of smoking   REVIEW OF SYSTEMS:    GU Review  Male:   Patient denies frequent urination, hard to postpone urination, burning/ pain with urination, get up at night to urinate, leakage of urine, stream starts and stops, trouble starting your streams, have to  strain to urinate , erection problems, and penile pain.  Gastrointestinal (Upper):   Patient denies nausea, vomiting, and indigestion/ heartburn.  Gastrointestinal (Lower):   Patient denies diarrhea and constipation.  Constitutional:   Patient denies fever, night sweats, weight loss, and fatigue.  Skin:   Patient denies skin rash/ lesion and itching.  Eyes:   Patient denies blurred vision and double vision.  Ears/ Nose/ Throat:   Patient reports sinus problems. Patient denies sore throat.  Hematologic/Lymphatic:   Patient denies swollen glands and easy bruising.  Cardiovascular:   Patient denies leg swelling and chest pains.  Respiratory:   Patient denies cough and shortness of breath.  Endocrine:   Patient denies excessive thirst.  Musculoskeletal:   Patient denies back pain and joint pain.  Neurological:   Patient denies headaches and dizziness.  Psychologic:   Patient denies depression and anxiety.   VITAL SIGNS:    BP: 173/79 mmHg   Heart Rate: 84 /min   Temp: 97.7 F / 36 C      MULTI-SYSTEM PHYSICAL EXAMINATION:    Constitutional: Well-nourished. No physical deformities. Normally developed. Good grooming.   Neck: Neck symmetrical, not swollen. Normal tracheal position.   Respiratory: No labored breathing, no use of accessory muscles.   Cardiovascular: Normal temperature, normal extremity pulses, no swelling, no varicosities.   Gastrointestinal: No mass, no tenderness, no rigidity, non obese abdomen.   Eyes: Normal conjunctivae. Normal eyelids.   Ears, Nose, Mouth, and Throat: Left ear no scars, no lesions, no masses. Right ear no scars, no lesions, no masses. Nose no scars, no lesions, no masses. Normal hearing. Normal lips.   Musculoskeletal: Normal gait and station of head and neck.   PAST DATA REVIEWED:  Source Of History:  Patient, Family/Caregiver  Lab Test Review:   PSA  Records Review:   AUA Symptom Score   PROCEDURES:          Urinalysis - 81003 Dipstick Micro   Specimen: Voided WBC/hpf: 0-51-2  Appearance: Clear Epithelial Cells: Neg  Color: Yellow Bacteria: Neg  Glucose: Neg Fine Grain Casts/lpf: Neg  Bilirubin: Neg Coarse Grain Casts/lpf: Neg  Ketones: Neg    Specific Gravity: 1.005      ASSESSMENT:      ICD-10 Details  1 GU:   Prostate Cancer - C61   2   BPH w/LUTS - N40.1    PLAN:           Schedule Return Visit: Schedule Surgery  Procedure: Unspecified Date - Laser Surgery Prostate - [phone]          Document Letter(s):  Created for Aleksei V. Plotnikov, MD   Created for Patient: Clinical Summary    The risks, benefits, and some of the possible complications of the proposed procedure were discussed with the patient at length and in detail including the possibility of postoperative urinary urgency, frequency, incontinence, dysuria, hematuria, retrograde ejaculation, urinary retention, bladder neck contracture, and urethral stricture, as well as the need for a bladder biopsy, retrograde pyelograms, resection of a bladder lesion, dilation of the urethra, postoperative catheterization, placement of a ureteral stent, discovering asymptomatic prostate cancer and others. The possible need for postoperative treatments including further surgical procedures was discussed with the patient.   The general risks of the operative procedure and  the perioperative period were discussed with the patient at length and in detail including swelling, pain, nausea, vomiting, fever, chills, infection, wound infection, sepsis, renal failure, internal or external bleeding, intraoperative bowel, organ or vascular injuries, postoperative formation of scar tissue, the need for blood transfusions, deep venous thrombosis or blood clots, pulmonary embolus, pneumonia, respiratory failure, heart attack, stroke, death and others.   All of the patient's questions were answered and he voiced an understanding of these risks, benefits and possible complications. The patient  gave fully informed consent to proceed with the procedure.          Notes:   -plan for Greenlight laser ablation of prostate  -No evidence of prostate cancer. Due for PSA in 3 months

## 2015-07-20 NOTE — Op Note (Signed)
Date of procedure: 07/20/2015  Preoperative diagnosis:  1. BPH with urinary retention  2. History of prostate cancer  Postoperative diagnosis:  1. BPH with urinary retention 2. History of Prostate cancer   Procedure: 1. Photo vaporization of prostate with greenlight laser  Surgeon: Baruch Gouty, MD  Anesthesia: General  Complications: None  Intraoperative findings: The patient had a large median lobe extending into his bladder particularly on the right side. At the end of the procedure, the patient's urethra was patent. The median lobe was mostly vaporized. His ureteral orifices and verumontanum were intact.   EBL: 10 cc   Specimens: None   Drains: 20 French Foley catheter   Disposition: Stable to the postanesthesia care unit  Indication for procedure: The patient is a 75 y.o. male with history of prostate cancer with no current evidence of disease to has urinary retention secondary to BPH. He presents today for photo vaporization of prostate.  After reviewing the management options for treatment, the patient elected to proceed with the above surgical procedure(s). We have discussed the potential benefits and risks of the procedure, side effects of the proposed treatment, the likelihood of the patient achieving the goals of the procedure, and any potential problems that might occur during the procedure or recuperation. Informed consent has been obtained.  Description of procedure: The patient was met in the preoperative area. All risks, benefits, and indications of the procedure were described in great detail. The patient consented to the procedure. Preoperative antibiotics were given. The patient was taken to the operative theater. General anesthesia was induced per the anesthesia service. The patient was then placed in the dorsal lithotomy position and prepped and draped in the usual sterile fashion. A preoperative timeout was called.    A 21 French 30 cystoscope was inserted  into the patient's bladder per urethra atraumatically. He was noted to have significant trilobar hypertrophy. He had a large median lobe extending into the right side of his bladder. The left ureteral orifice was easily identified. The median lobe was carefully vaporized until the right ureter orifice was identified. The medial lobe was then vaporized down to approximately 10% of its previous volume. Fibrillation and took place in a circumferential fashion with a prostatic urethral physical to avoid trauma to the verumontanum. This took place until the urethra was patent visually. Due to his previous history of radiation, did not want to fully obliterate his remaining prostate tissue as it may be aiding in his continence. The procedure this point was ended. Start time was 1:20 PM. Stop time was 2:45 PM. Power range from 80-180 W. [id number] of energy were used. Laser time was 50 minutes and 47 seconds. Hemostasis at this point was excellent. The cystoscope was removed. A 20 French Foley catheter was then placed to drainage. The patient was woken from anesthesia and transferred in stable condition to the post anesthesia care unit.  Plan: The patient for follow-up in 2 days for trial of void. He will see me for postop check in 1 month. We'll continue to monitor his PSA due to his history of prostate cancer at that time.   Baruch Gouty, M.D.

## 2015-07-20 NOTE — Discharge Instructions (Signed)
Post Anesthesia Home Care Instructions  Activity: Get plenty of rest for the remainder of the day. A responsible adult should stay with you for 24 hours following the procedure.  For the next 24 hours, DO NOT: -Drive a car -Paediatric nurse -Drink alcoholic beverages -Take any medication unless instructed by your physician -Make any legal decisions or sign important papers.  Meals: Start with liquid foods such as gelatin or soup. Progress to regular foods as tolerated. Avoid greasy, spicy, heavy foods. If nausea and/or vomiting occur, drink only clear liquids until the nausea and/or vomiting subsides. Call your physician if vomiting continues.  Special Instructions/Symptoms: Your throat may feel dry or sore from the anesthesia or the breathing tube placed in your throat during surgery. If this causes discomfort, gargle with warm salt water. The discomfort should disappear within 24 hours.  If you had a scopolamine patch placed behind your ear for the management of post- operative nausea and/or vomiting:  1. The medication in the patch is effective for 72 hours, after which it should be removed.  Wrap patch in a tissue and discard in the trash. Wash hands thoroughly with soap and water. 2. You may remove the patch earlier than 72 hours if you experience unpleasant side effects which may include dry mouth, dizziness or visual disturbances. 3. Avoid touching the patch. Wash your hands with soap and water after contact with the patch.   Foley Catheter Care, Adult A Foley catheter is a soft, flexible tube that is placed into the bladder to drain urine. A Foley catheter may be inserted if:  You leak urine or are not able to control when you urinate (urinary incontinence).  You are not able to urinate when you need to (urinary retention).  You had prostate surgery or surgery on the genitals.  You have certain medical conditions, such as multiple sclerosis, dementia, or a spinal cord  injury. If you are going home with a Foley catheter in place, follow the instructions below. TAKING CARE OF THE CATHETER 1. Wash your hands with soap and water. 2. Using mild soap and warm water on a clean washcloth:  Clean the area on your body closest to the catheter insertion site using a circular motion, moving away from the catheter. Never wipe toward the catheter because this could sweep bacteria up into the urethra and cause infection.  Remove all traces of soap. Pat the area dry with a clean towel. For males, reposition the foreskin. 3. Attach the catheter to your leg so there is no tension on the catheter. Use adhesive tape or a leg strap. If you are using adhesive tape, remove any sticky residue left behind by the previous tape you used. 4. Keep the drainage bag below the level of the bladder, but keep it off the floor. 5. Check throughout the day to be sure the catheter is working and urine is draining freely. Make sure the tubing does not become kinked. 6. Do not pull on the catheter or try to remove it. Pulling could damage internal tissues. TAKING CARE OF THE DRAINAGE BAGS You will be given two drainage bags to take home. One is a large overnight drainage bag, and the other is a smaller leg bag that fits underneath clothing. You may wear the overnight bag at any time, but you should never wear the smaller leg bag at night. Follow the instructions below for how to empty, change, and clean your drainage bags. Emptying the Drainage Bag You must empty your  drainage bag when it is  - full or at least 2-3 times a day. 1. Wash your hands with soap and water. 2. Keep the drainage bag below your hips, below the level of your bladder. This stops urine from going back into the tubing and into your bladder. 3. Hold the dirty bag over the toilet or a clean container. 4. Open the pour spout at the bottom of the bag and empty the urine into the toilet or container. Do not let the pour spout touch  the toilet, container, or any other surface. Doing so can place bacteria on the bag, which can cause an infection. 5. Clean the pour spout with a gauze pad or cotton ball that has rubbing alcohol on it. 6. Close the pour spout. 7. Attach the bag to your leg with adhesive tape or a leg strap. 8. Wash your hands well. Changing the Drainage Bag Change your drainage bag once a month or sooner if it starts to smell bad or look dirty. Below are steps to follow when changing the drainage bag. 1. Wash your hands with soap and water. 2. Pinch off the rubber catheter so that urine does not spill out. 3. Disconnect the catheter tube from the drainage tube at the connection valve. Do not let the tubes touch any surface. 4. Clean the end of the catheter tube with an alcohol wipe. Use a different alcohol wipe to clean the end of the drainage tube. 5. Connect the catheter tube to the drainage tube of the clean drainage bag. 6. Attach the new bag to the leg with adhesive tape or a leg strap. Avoid attaching the new bag too tightly. 7. Wash your hands well. Cleaning the Drainage Bag 1. Wash your hands with soap and water. 2. Wash the bag in warm, soapy water. 3. Rinse the bag thoroughly with warm water. 4. Fill the bag with a solution of white vinegar and water (1 cup vinegar to 1 qt warm water [.2 L vinegar to 1 L warm water]). Close the bag and soak it for 30 minutes in the solution. 5. Rinse the bag with warm water. 6. Hang the bag to dry with the pour spout open and hanging downward. 7. Store the clean bag (once it is dry) in a clean plastic bag. 8. Wash your hands well. PREVENTING INFECTION  Wash your hands before and after handling your catheter.  Take showers daily and wash the area where the catheter enters your body. Do not take baths. Replace wet leg straps with dry ones, if this applies.  Do not use powders, sprays, or lotions on the genital area. Only use creams, lotions, or ointments as  directed by your caregiver.  For females, wipe from front to back after each bowel movement.  Drink enough fluids to keep your urine clear or pale yellow unless you have a fluid restriction.  Do not let the drainage bag or tubing touch or lie on the floor.  Wear cotton underwear to absorb moisture and to keep your skin drier. SEEK MEDICAL CARE IF:   Your urine is cloudy or smells unusually bad.  Your catheter becomes clogged.  You are not draining urine into the bag or your bladder feels full.  Your catheter starts to leak. SEEK IMMEDIATE MEDICAL CARE IF:   You have pain, swelling, redness, or pus where the catheter enters the body.  You have pain in the abdomen, legs, lower back, or bladder.  You have a fever.  You  see blood fill the catheter, or your urine is pink or red.  You have nausea, vomiting, or chills.  Your catheter gets pulled out. MAKE SURE YOU:   Understand these instructions.  Will watch your condition.  Will get help right away if you are not doing well or get worse.   This information is not intended to replace advice given to you by your health care provider. Make sure you discuss any questions you have with your health care provider.   Document Released: 01/10/2005 Document Revised: 05/27/2013 Document Reviewed: 01/02/2012 Elsevier Interactive Patient Education [employer].

## 2015-07-20 NOTE — Transfer of Care (Signed)
  Last Vitals:  Filed Vitals:   07/20/15 1132  BP: 169/91  Pulse: 87  Temp: 36.6 C  Resp: 18    Last Pain: There were no vitals filed for this visit.    Patients Stated Pain Goal: 6 (07/20/15 1202)  Immediate Anesthesia Transfer of Care Note  Patient: Jeffrey Carter  Procedure(s) Performed: Procedure(s) (LRB): GREEN LIGHT LASER ABLATION OF PROSTATE  (N/A)  Patient Location: PACU  Anesthesia Type: General  Level of Consciousness: awake, alert  and oriented  Airway & Oxygen Therapy: Patient Spontanous Breathing and Patient connected to face mask oxygen  Post-op Assessment: Report given to PACU RN and Post -op Vital signs reviewed and stable  Post vital signs: Reviewed and stable  Complications: No apparent anesthesia complications

## 2015-07-20 NOTE — Anesthesia Postprocedure Evaluation (Signed)
Anesthesia Post Note  Patient: Jeffrey Carter  Procedure(s) Performed: Procedure(s) (LRB): GREEN LIGHT LASER ABLATION OF PROSTATE  (N/A)  Patient location during evaluation: PACU Anesthesia Type: General Level of consciousness: sedated Pain management: pain level controlled Vital Signs Assessment: post-procedure vital signs reviewed and stable Respiratory status: spontaneous breathing and respiratory function stable Cardiovascular status: stable Anesthetic complications: no    Last Vitals:  Filed Vitals:   07/20/15 1625 07/20/15 1630  BP: 167/85 164/84  Pulse: 70 71  Temp:    Resp: 17 14    Last Pain:  Filed Vitals:   07/20/15 1637  PainSc: 0-No pain                 Hezekiah Veltre DANIEL

## 2015-07-20 NOTE — Progress Notes (Signed)
Dr. Gifford Shave aware of increased BP. Orders given.

## 2015-07-20 NOTE — Anesthesia Procedure Notes (Signed)
Procedure Name: LMA Insertion Date/Time: 07/20/2015 1:06 PM Performed by: Mechele Claude Pre-anesthesia Checklist: Patient identified, Emergency Drugs available, Suction available and Patient being monitored Patient Re-evaluated:Patient Re-evaluated prior to inductionOxygen Delivery Method: Circle System Utilized Preoxygenation: Pre-oxygenation with 100% oxygen Intubation Type: IV induction Ventilation: Mask ventilation without difficulty LMA: LMA inserted LMA Size: 5.0 Number of attempts: 1 Airway Equipment and Method: bite block Placement Confirmation: positive ETCO2 Tube secured with: Tape Dental Injury: Teeth and Oropharynx as per pre-operative assessment

## 2015-07-20 NOTE — Anesthesia Preprocedure Evaluation (Signed)
Anesthesia Evaluation  Patient identified by MRN, date of birth, ID band Patient awake    Reviewed: Allergy & Precautions, NPO status , Patient's Chart, lab work & pertinent test results  Airway Mallampati: II  TM Distance: <3 FB Neck ROM: Full    Dental no notable dental hx.    Pulmonary sleep apnea and Continuous Positive Airway Pressure Ventilation , former smoker,    Pulmonary exam normal breath sounds clear to auscultation       Cardiovascular hypertension, Pt. on medications Normal cardiovascular exam Rhythm:Regular Rate:Normal     Neuro/Psych negative neurological ROS  negative psych ROS   GI/Hepatic negative GI ROS, Neg liver ROS,   Endo/Other  negative endocrine ROS  Renal/GU negative Renal ROS  negative genitourinary   Musculoskeletal negative musculoskeletal ROS (+)   Abdominal   Peds negative pediatric ROS (+)  Hematology negative hematology ROS (+)   Anesthesia Other Findings   Reproductive/Obstetrics negative OB ROS                             Anesthesia Physical Anesthesia Plan  ASA: III  Anesthesia Plan: General   Post-op Pain Management:    Induction: Intravenous  Airway Management Planned: LMA  Additional Equipment:   Intra-op Plan:   Post-operative Plan: Extubation in OR  Informed Consent: I have reviewed the patients History and Physical, chart, labs and discussed the procedure including the risks, benefits and alternatives for the proposed anesthesia with the patient or authorized representative who has indicated his/her understanding and acceptance.   Dental advisory given  Plan Discussed with: CRNA and Surgeon  Anesthesia Plan Comments:         Anesthesia Quick Evaluation

## 2015-07-21 ENCOUNTER — Encounter (HOSPITAL_BASED_OUTPATIENT_CLINIC_OR_DEPARTMENT_OTHER): Payer: Self-pay | Admitting: Urology

## 2015-07-24 DIAGNOSIS — G4733 Obstructive sleep apnea (adult) (pediatric): Secondary | ICD-10-CM | POA: Diagnosis not present

## 2015-08-23 DIAGNOSIS — G4733 Obstructive sleep apnea (adult) (pediatric): Secondary | ICD-10-CM | POA: Diagnosis not present

## 2015-08-24 ENCOUNTER — Telehealth: Payer: Self-pay | Admitting: Emergency Medicine

## 2015-08-24 DIAGNOSIS — N400 Enlarged prostate without lower urinary tract symptoms: Secondary | ICD-10-CM | POA: Diagnosis not present

## 2015-08-24 DIAGNOSIS — C61 Malignant neoplasm of prostate: Secondary | ICD-10-CM | POA: Diagnosis not present

## 2015-08-24 NOTE — Telephone Encounter (Signed)
Pt wants to know if he still needs to take furosemide (LASIX) 40 MG tablet Mylan and furosemide (LASIX) 40 MG tablet sandoz. He just had surgery and is using the bathroom a lot. Please follow up thanks.

## 2015-08-25 NOTE — Telephone Encounter (Signed)
Ok to hold furosemide Thx

## 2015-08-25 NOTE — Telephone Encounter (Signed)
Pts wife informed.

## 2015-09-18 ENCOUNTER — Ambulatory Visit: Payer: Medicare Other | Admitting: Internal Medicine

## 2015-09-22 ENCOUNTER — Ambulatory Visit (INDEPENDENT_AMBULATORY_CARE_PROVIDER_SITE_OTHER): Payer: Medicare Other

## 2015-09-22 DIAGNOSIS — Z23 Encounter for immunization: Secondary | ICD-10-CM

## 2015-09-22 DIAGNOSIS — G4733 Obstructive sleep apnea (adult) (pediatric): Secondary | ICD-10-CM | POA: Diagnosis not present

## 2015-10-09 ENCOUNTER — Encounter: Payer: Self-pay | Admitting: Pharmacist

## 2015-10-09 NOTE — Progress Notes (Unsigned)
[facility] event, patient seen by Marliss Coots.  BP elevate, patient given clonidine 0.1 mg x 1 dose. Patient educated about HTN, advised to follow up with PCP. Patient verbalized understanding.

## 2015-10-12 ENCOUNTER — Ambulatory Visit (INDEPENDENT_AMBULATORY_CARE_PROVIDER_SITE_OTHER): Payer: Medicare Other | Admitting: Internal Medicine

## 2015-10-12 ENCOUNTER — Encounter: Payer: Self-pay | Admitting: Internal Medicine

## 2015-10-12 DIAGNOSIS — Z23 Encounter for immunization: Secondary | ICD-10-CM | POA: Diagnosis not present

## 2015-10-12 DIAGNOSIS — H6123 Impacted cerumen, bilateral: Secondary | ICD-10-CM | POA: Diagnosis not present

## 2015-10-12 DIAGNOSIS — C61 Malignant neoplasm of prostate: Secondary | ICD-10-CM | POA: Diagnosis not present

## 2015-10-12 DIAGNOSIS — I10 Essential (primary) hypertension: Secondary | ICD-10-CM

## 2015-10-12 NOTE — Progress Notes (Signed)
Subjective:  Patient ID: Jeffrey Carter, male    DOB: 1940/04/19  Age: 75 y.o. MRN: [id number]  CC: No chief complaint on file.   HPI YGNACIO RADEK [facility] - they had health screening. BP at home is "up and down". C/o hearing loss  Outpatient Medications Prior to Visit  Medication Sig Dispense Refill  . amLODipine (NORVASC) 5 MG tablet TAKE ONE TABLET BY MOUTH ONCE DAILY (Patient taking differently: TAKE 5 MG BY MOUTH ONCE DAILY--  takes in am) 30 tablet 11  . aspirin 81 MG tablet Take 81 mg by mouth daily.      . cloNIDine (CATAPRES) 0.3 MG tablet Take 1 tablet (0.3 mg total) by mouth 2 (two) times daily. 180 tablet 3  . fluticasone (FLONASE) 50 MCG/ACT nasal spray Place 2 sprays into both nostrils daily. 16 g 2  . loratadine (CLARITIN) 10 MG tablet Take 1 tablet (10 mg total) by mouth daily. (Patient taking differently: Take 10 mg by mouth every evening. ) 100 tablet 3  . losartan (COZAAR) 100 MG tablet Take 1 tablet (100 mg total) by mouth daily. (Patient taking differently: Take 100 mg by mouth every morning. ) 90 tablet 3  . lovastatin (MEVACOR) 40 MG tablet TAKE ONE TABLET BY MOUTH ONCE DAILY AT 6PM (Patient taking differently: TAKE 40 MG BY MOUTH ONCE DAILY AT 6PM) 30 tablet 11  . finasteride (PROSCAR) 5 MG tablet Take 5 mg by mouth every morning.     . furosemide (LASIX) 40 MG tablet TAKE ONE TABLET BY MOUTH ONCE DAILY PRN swelling (Patient not taking: Reported on 10/12/2015) 90 tablet 1  . HYDROcodone-acetaminophen (NORCO) 5-325 MG tablet Take 1 tablet by mouth every 6 (six) hours as needed for moderate pain. (Patient not taking: Reported on 10/12/2015) 30 tablet 0  . tamsulosin (FLOMAX) 0.4 MG CAPS capsule Take 1 capsule (0.4 mg total) by mouth daily. (Patient not taking: Reported on 10/12/2015) 30 capsule 3  . cephALEXin (KEFLEX) 500 MG capsule Take 1 capsule (500 mg total) by mouth 3 (three) times daily. (Patient not taking: Reported on 10/12/2015) 6  capsule 0   No facility-administered medications prior to visit.     ROS Review of Systems  Constitutional: Negative for appetite change, fatigue and unexpected weight change.  HENT: Negative for congestion, nosebleeds, sneezing, sore throat and trouble swallowing.   Eyes: Negative for itching and visual disturbance.  Respiratory: Negative for cough.   Cardiovascular: Negative for chest pain, palpitations and leg swelling.  Gastrointestinal: Negative for abdominal distention, blood in stool, diarrhea and nausea.  Genitourinary: Negative for frequency and hematuria.  Musculoskeletal: Negative for back pain, gait problem, joint swelling and neck pain.  Skin: Negative for rash.  Neurological: Negative for dizziness, tremors, speech difficulty and weakness.  Psychiatric/Behavioral: Negative for agitation, dysphoric mood and sleep disturbance. The patient is not nervous/anxious.     Objective:  BP 112/78   Pulse 71   Temp 97.8 F (36.6 C) (Oral)   Wt 235 lb (106.6 kg)   SpO2 96%   BMI 34.70 kg/m   BP Readings from Last 3 Encounters:  10/12/15 112/78  10/09/15 (!) 190/126  07/20/15 (!) 172/94    Wt Readings from Last 3 Encounters:  10/12/15 235 lb (106.6 kg)  07/20/15 233 lb (105.7 kg)  07/02/15 237 lb (107.5 kg)    Physical Exam  Constitutional: He is oriented to person, place, and time. He appears well-developed. No distress.  NAD  HENT:  Mouth/Throat: Oropharynx is clear and moist.  Eyes: Conjunctivae are normal. Pupils are equal, round, and reactive to light.  Neck: Normal range of motion. No JVD present. No thyromegaly present.  Cardiovascular: Normal rate, regular rhythm, normal heart sounds and intact distal pulses.  Exam reveals no gallop and no friction rub.   No murmur heard. Pulmonary/Chest: Effort normal and breath sounds normal. No respiratory distress. He has no wheezes. He has no rales. He exhibits no tenderness.  Abdominal: Soft. Bowel sounds are normal.  He exhibits no distension and no mass. There is no tenderness. There is no rebound and no guarding.  Musculoskeletal: Normal range of motion. He exhibits no edema or tenderness.  Lymphadenopathy:    He has no cervical adenopathy.  Neurological: He is alert and oriented to person, place, and time. He has normal reflexes. No cranial nerve deficit. He exhibits normal muscle tone. He displays a negative Romberg sign. Coordination and gait normal.  Skin: Skin is warm and dry. No rash noted.  Psychiatric: He has a normal mood and affect. His behavior is normal. Judgment and thought content normal.  wax B   Procedure Note :     Procedure :  Ear irrigation   Indication:  Cerumen impaction   Risks, including pain, dizziness, eardrum perforation, bleeding, infection and others as well as benefits were explained to the patient in detail. Verbal consent was obtained and the patient agreed to proceed.    We used "The Elephant Ear Irrigation Device" filled with lukewarm water for irrigation. A large amount wax was recovered. Procedure has also required manual wax removal with an ear loop.   Tolerated well. Complications: None.   Postprocedure instructions :  Call if problems.    Lab Results  Component Value Date   WBC 6.0 06/18/2015   HGB 12.9 (L) 07/20/2015   HCT 38.0 (L) 07/20/2015   PLT 256.0 06/18/2015   GLUCOSE 120 (H) 07/20/2015   CHOL 154 02/20/2013   TRIG 121.0 02/20/2013   HDL 48.60 02/20/2013   LDLCALC 81 02/20/2013   ALT 20 06/18/2015   AST 18 06/18/2015   NA 141 07/20/2015   K 3.5 07/20/2015   CL 102 07/20/2015   CREATININE 1.00 07/20/2015   BUN 10 07/20/2015   CO2 30 06/18/2015   TSH 0.93 04/03/2013   PSA 5.25 (H) 12/11/2013   INR 1.01 10/12/2014    No results found.  Assessment & Plan:   There are no diagnoses linked to this encounter. I have discontinued Mr. Schorr cephALEXin. I am also having him maintain his aspirin, tamsulosin, loratadine, finasteride,  fluticasone, furosemide, losartan, cloNIDine, amLODipine, lovastatin, and HYDROcodone-acetaminophen.  No orders of the defined types were placed in this encounter.    Follow-up: No Follow-up on file.  [name], MD

## 2015-10-12 NOTE — Assessment & Plan Note (Signed)
Lupron

## 2015-10-12 NOTE — Assessment & Plan Note (Signed)
Amlodipine, Catapress, Furosemide, Losartan Improve compliance w/Clonidine. Re-start Furosemide Cont BP checks Consider Doxazosin

## 2015-10-12 NOTE — Patient Instructions (Signed)
Improve compliance w/Clonidine Continue BP checks at home

## 2015-10-12 NOTE — Assessment & Plan Note (Signed)
Will irrigate 

## 2015-10-12 NOTE — Addendum Note (Signed)
Addended by: Cresenciano Lick on: 10/12/2015 12:16 PM   Modules accepted: Orders

## 2015-10-12 NOTE — Progress Notes (Signed)
Pre visit review using our clinic review tool, if applicable. No additional management support is needed unless otherwise documented below in the visit note. 

## 2015-10-22 DIAGNOSIS — G4733 Obstructive sleep apnea (adult) (pediatric): Secondary | ICD-10-CM | POA: Diagnosis not present

## 2015-11-16 DIAGNOSIS — C61 Malignant neoplasm of prostate: Secondary | ICD-10-CM | POA: Diagnosis not present

## 2015-11-21 DIAGNOSIS — G4733 Obstructive sleep apnea (adult) (pediatric): Secondary | ICD-10-CM | POA: Diagnosis not present

## 2015-11-24 DIAGNOSIS — C61 Malignant neoplasm of prostate: Secondary | ICD-10-CM | POA: Diagnosis not present

## 2015-11-24 DIAGNOSIS — N400 Enlarged prostate without lower urinary tract symptoms: Secondary | ICD-10-CM | POA: Diagnosis not present

## 2015-11-29 ENCOUNTER — Other Ambulatory Visit: Payer: Self-pay | Admitting: Internal Medicine

## 2015-12-09 ENCOUNTER — Other Ambulatory Visit: Payer: Self-pay | Admitting: Internal Medicine

## 2015-12-21 DIAGNOSIS — G4733 Obstructive sleep apnea (adult) (pediatric): Secondary | ICD-10-CM | POA: Diagnosis not present

## 2016-01-11 ENCOUNTER — Encounter: Payer: Self-pay | Admitting: Internal Medicine

## 2016-01-11 ENCOUNTER — Ambulatory Visit (INDEPENDENT_AMBULATORY_CARE_PROVIDER_SITE_OTHER): Payer: Medicare Other | Admitting: Internal Medicine

## 2016-01-11 DIAGNOSIS — M545 Low back pain, unspecified: Secondary | ICD-10-CM

## 2016-01-11 DIAGNOSIS — I10 Essential (primary) hypertension: Secondary | ICD-10-CM

## 2016-01-11 DIAGNOSIS — R300 Dysuria: Secondary | ICD-10-CM | POA: Insufficient documentation

## 2016-01-11 DIAGNOSIS — N320 Bladder-neck obstruction: Secondary | ICD-10-CM

## 2016-01-11 DIAGNOSIS — E785 Hyperlipidemia, unspecified: Secondary | ICD-10-CM | POA: Diagnosis not present

## 2016-01-11 MED ORDER — AMLODIPINE BESYLATE 5 MG PO TABS: 5.0000 mg | ORAL_TABLET | Freq: Every day | ORAL | 3 refills | Status: DC

## 2016-01-11 MED ORDER — HYDROCODONE-ACETAMINOPHEN 5-325 MG PO TABS: 0.5000 | ORAL_TABLET | Freq: Four times a day (QID) | ORAL | 0 refills | Status: DC | PRN

## 2016-01-11 MED ORDER — SILDENAFIL CITRATE 100 MG PO TABS: 100.0000 mg | ORAL_TABLET | ORAL | 5 refills | Status: DC | PRN

## 2016-01-11 MED ORDER — PHENAZOPYRIDINE HCL 100 MG PO TABS: 100.0000 mg | ORAL_TABLET | Freq: Three times a day (TID) | ORAL | 1 refills | Status: DC | PRN

## 2016-01-11 NOTE — Progress Notes (Signed)
Pre visit review using our clinic review tool, if applicable. No additional management support is needed unless otherwise documented below in the visit note. 

## 2016-01-11 NOTE — Progress Notes (Signed)
Subjective:  Patient ID: Jeffrey Carter, male    DOB: [birth year]  Age: 75 y.o. MRN: [id number]  CC: No chief complaint on file.   HPI Jeffrey Carter presents for HTN, dyslipidemia, prostate ca. C/o pain on urination after XRT  Outpatient Medications Prior to Visit  Medication Sig Dispense Refill  . amLODipine (NORVASC) 5 MG tablet TAKE ONE TABLET BY MOUTH ONCE DAILY (Patient taking differently: TAKE 5 MG BY MOUTH ONCE DAILY--  takes in am) 30 tablet 11  . aspirin 81 MG tablet Take 81 mg by mouth daily.      . cloNIDine (CATAPRES) 0.3 MG tablet Take 1 tablet (0.3 mg total) by mouth 2 (two) times daily. 180 tablet 3  . finasteride (PROSCAR) 5 MG tablet Take 5 mg by mouth every morning.     . fluticasone (FLONASE) 50 MCG/ACT nasal spray Place 2 sprays into both nostrils daily. 16 g 2  . furosemide (LASIX) 40 MG tablet TAKE ONE TABLET BY MOUTH ONCE DAILY AS NEEDED FOR SWELLING 90 tablet 1  . HYDROcodone-acetaminophen (NORCO) 5-325 MG tablet Take 1 tablet by mouth every 6 (six) hours as needed for moderate pain. 30 tablet 0  . loratadine (CLARITIN) 10 MG tablet TAKE ONE TABLET BY MOUTH ONCE DAILY 100 tablet 3  . losartan (COZAAR) 100 MG tablet Take 1 tablet (100 mg total) by mouth daily. (Patient taking differently: Take 100 mg by mouth every morning. ) 90 tablet 3  . lovastatin (MEVACOR) 40 MG tablet TAKE ONE TABLET BY MOUTH ONCE DAILY AT 6PM (Patient taking differently: TAKE 40 MG BY MOUTH ONCE DAILY AT 6PM) 30 tablet 11  . tamsulosin (FLOMAX) 0.4 MG CAPS capsule Take 1 capsule (0.4 mg total) by mouth daily. 30 capsule 3  . VIAGRA 100 MG tablet TAKE ONE TABLET BY MOUTH AS NEEDED FOR ERETILE DYSFUNCTION 1 tablet 3   No facility-administered medications prior to visit.     ROS Review of Systems  Constitutional: Positive for fatigue. Negative for appetite change and unexpected weight change.  HENT: Negative for congestion, nosebleeds, sneezing, sore throat and trouble swallowing.     Eyes: Negative for itching and visual disturbance.  Respiratory: Negative for cough.   Cardiovascular: Negative for chest pain, palpitations and leg swelling.  Gastrointestinal: Negative for abdominal distention, blood in stool, diarrhea and nausea.  Genitourinary: Positive for dysuria. Negative for frequency and hematuria.  Musculoskeletal: Positive for arthralgias and back pain. Negative for gait problem, joint swelling and neck pain.  Skin: Negative for rash.  Neurological: Negative for dizziness, tremors, speech difficulty and weakness.  Psychiatric/Behavioral: Negative for agitation, dysphoric mood and sleep disturbance. The patient is not nervous/anxious.     Objective:  BP 118/80   Pulse 72   Wt 239 lb (108.4 kg)   SpO2 95%   BMI 35.29 kg/m   BP Readings from Last 3 Encounters:  01/11/16 118/80  10/12/15 112/78  10/09/15 (!) 154/126    Wt Readings from Last 3 Encounters:  01/11/16 239 lb (108.4 kg)  10/12/15 235 lb (106.6 kg)  07/20/15 233 lb (105.7 kg)    Physical Exam  Constitutional: He is oriented to person, place, and time. He appears well-developed. No distress.  NAD  HENT:  Mouth/Throat: Oropharynx is clear and moist.  Eyes: Conjunctivae are normal. Pupils are equal, round, and reactive to light.  Neck: Normal range of motion. No JVD present. No thyromegaly present.  Cardiovascular: Normal rate, regular rhythm, normal heart sounds and intact distal  pulses.  Exam reveals no gallop and no friction rub.   No murmur heard. Pulmonary/Chest: Effort normal and breath sounds normal. No respiratory distress. He has no wheezes. He has no rales. He exhibits no tenderness.  Abdominal: Soft. Bowel sounds are normal. He exhibits no distension and no mass. There is no tenderness. There is no rebound and no guarding.  Musculoskeletal: Normal range of motion. He exhibits no edema or tenderness.  Lymphadenopathy:    He has no cervical adenopathy.  Neurological: He is alert  and oriented to person, place, and time. He has normal reflexes. No cranial nerve deficit. He exhibits normal muscle tone. He displays a negative Romberg sign. Coordination and gait normal.  Skin: Skin is warm and dry. No rash noted.  Psychiatric: He has a normal mood and affect. His behavior is normal. Judgment and thought content normal.  Penis - WNL  Lab Results  Component Value Date   WBC 6.0 06/18/2015   HGB 12.9 (L) 07/20/2015   HCT 38.0 (L) 07/20/2015   PLT 256.0 06/18/2015   GLUCOSE 120 (H) 07/20/2015   CHOL 154 02/20/2013   TRIG 121.0 02/20/2013   HDL 48.60 02/20/2013   LDLCALC 81 02/20/2013   ALT 20 06/18/2015   AST 18 06/18/2015   NA 141 07/20/2015   K 3.5 07/20/2015   CL 102 07/20/2015   CREATININE 1.00 07/20/2015   BUN 10 07/20/2015   CO2 30 06/18/2015   TSH 0.93 04/03/2013   PSA 5.25 (H) 12/11/2013   INR 1.01 10/12/2014    No results found.  Assessment & Plan:   There are no diagnoses linked to this encounter. I am having Mr. Kammer maintain his aspirin, tamsulosin, finasteride, fluticasone, losartan, cloNIDine, amLODipine, lovastatin, HYDROcodone-acetaminophen, VIAGRA, furosemide, and loratadine.  No orders of the defined types were placed in this encounter.    Follow-up: No Follow-up on file.  [name], MD

## 2016-01-11 NOTE — Assessment & Plan Note (Signed)
Better  

## 2016-01-11 NOTE — Assessment & Plan Note (Signed)
Chronic dysuria post XRT: Pyridium prn Norco prn severe sx's  Potential benefits of a long term opioids use as well as potential risks (i.e. addiction risk, apnea etc) and complications (i.e. Somnolence, constipation and others) were explained to the patient and were aknowledged.

## 2016-01-11 NOTE — Assessment & Plan Note (Signed)
Dysuria post XRT: try Pyridium prn

## 2016-01-11 NOTE — Assessment & Plan Note (Signed)
Amlodipine, Lasix prn, Losartan, Catapress

## 2016-01-11 NOTE — Assessment & Plan Note (Signed)
Lovastatin 

## 2016-01-20 ENCOUNTER — Encounter: Payer: Self-pay | Admitting: Internal Medicine

## 2016-01-20 ENCOUNTER — Ambulatory Visit (INDEPENDENT_AMBULATORY_CARE_PROVIDER_SITE_OTHER): Payer: Medicare Other | Admitting: Internal Medicine

## 2016-01-20 DIAGNOSIS — F558 Abuse of other non-psychoactive substances: Secondary | ICD-10-CM

## 2016-01-20 DIAGNOSIS — H6123 Impacted cerumen, bilateral: Secondary | ICD-10-CM

## 2016-01-20 DIAGNOSIS — J0100 Acute maxillary sinusitis, unspecified: Secondary | ICD-10-CM | POA: Diagnosis not present

## 2016-01-20 MED ORDER — AZITHROMYCIN 250 MG PO TABS: ORAL_TABLET | ORAL | 1 refills | Status: DC

## 2016-01-20 MED ORDER — TRIAMCINOLONE ACETONIDE 55 MCG/ACT NA AERO: 2.0000 | INHALATION_SPRAY | Freq: Every day | NASAL | 12 refills | Status: DC

## 2016-01-20 NOTE — Progress Notes (Signed)
Subjective:    Patient ID: Jeffrey Carter, male    DOB: 25-Mar-1940, 75 y.o.   MRN: [id number]  HPI   Here with 2-3 days acute onset fever, facial pain, pressure, headache, general weakness and malaise, and greenish d/c, with mild ST and cough, but pt denies chest pain, wheezing, increased sob or doe, orthopnea, PND, increased LE swelling, palpitations, dizziness or syncope.  Also with bilat hearing loss x 1 wk - ? Wax impactions, as has no pain, d/c.  Does have several wks ongoing nasal allergy symptoms with clearish congestion, itch and sneezing, without fever, pain, ST, cough, swelling or wheezing.  Has been using phenylephrine and now cant stop. No other new history Past Medical History:  Diagnosis Date  . Bladder outlet obstruction   . BPH (benign prostatic hyperplasia)   . Conjunctivitis, acute, bilateral    07-02-2015  per pcp note and started antibiotic drops  . First degree heart block   . History of acute conjunctivitis    07-02-2015  resolved after round of antibiotic eye drops  . History of gout    BIG TOE  . History of urinary retention 12/2014  . Hyperlipidemia   . Hypertension   . Lower urinary tract symptoms (LUTS)   . OSA on CPAP    MODERATE PER STUDY 03-08-2012  . Osteoarthritis   . PAC (premature atrial contraction)   . Prostate cancer [facility]) urologist-  dr budzyn/  oncologist-  dr Ander Slade   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),  Gleason 3+4,  PSA 10.57,  vol 111ml //   External beam radiation therapy  08-27-2014 to 10-22-2014  . S/P radiation therapy 08-27-2014  to  10-22-2014   prostate 7800Gy in 40 sessions and seminal vesicals 5000Gy in 40 sessions  . Wears glasses    Past Surgical History:  Procedure Laterality Date  . APPENDECTOMY  1966  . COLONOSCOPY  07-06-2006  . GOLD SEED IMPLANT N/A 08/12/2014   Procedure: GOLD SEED IMPLANT;  Surgeon: Lowella Bandy, MD;  Location: [facility];  Service: Urology;  Laterality: N/A;  . GREEN LIGHT LASER TURP  (TRANSURETHRAL RESECTION OF PROSTATE N/A 07/20/2015   Procedure: GREEN LIGHT LASER ABLATION OF PROSTATE ;  Surgeon: Nickie Retort, MD;  Location: [facility];  Service: Urology;  Laterality: N/A;  . PROSTATE BIOPSY N/A 03/28/2014   Procedure: BIOPSY TRANSRECTAL ULTRASONIC PROSTATE (TUBP);  Surgeon: Arvil Persons, MD;  Location: [facility];  Service: Urology;  Laterality: N/A;  . TONSILLECTOMY  as child  . TOTAL KNEE ARTHROPLASTY Right 06-22-2007    reports that he quit smoking about 54 years ago. His smoking use included Cigarettes. He has a 2.00 pack-year smoking history. He has never used smokeless tobacco. He reports that he does not drink alcohol or use drugs. family history includes Cancer in his brother; Diabetes in his father. No Known Allergies Current Outpatient Prescriptions on File Prior to Visit  Medication Sig Dispense Refill  . amLODipine (NORVASC) 5 MG tablet Take 1 tablet (5 mg total) by mouth daily. 90 tablet 3  . aspirin 81 MG tablet Take 81 mg by mouth daily.      . cloNIDine (CATAPRES) 0.3 MG tablet Take 1 tablet (0.3 mg total) by mouth 2 (two) times daily. 180 tablet 3  . finasteride (PROSCAR) 5 MG tablet Take 5 mg by mouth every morning.     . fluticasone (FLONASE) 50 MCG/ACT nasal spray Place 2 sprays into both nostrils daily.  16 g 2  . furosemide (LASIX) 40 MG tablet TAKE ONE TABLET BY MOUTH ONCE DAILY AS NEEDED FOR SWELLING 90 tablet 1  . HYDROcodone-acetaminophen (NORCO) 5-325 MG tablet Take 0.5-1 tablets by mouth every 6 (six) hours as needed for moderate pain. 60 tablet 0  . loratadine (CLARITIN) 10 MG tablet TAKE ONE TABLET BY MOUTH ONCE DAILY 100 tablet 3  . losartan (COZAAR) 100 MG tablet Take 1 tablet (100 mg total) by mouth daily. (Patient taking differently: Take 100 mg by mouth every morning. ) 90 tablet 3  . lovastatin (MEVACOR) 40 MG tablet TAKE ONE TABLET BY MOUTH ONCE DAILY AT 6PM (Patient taking differently: TAKE 40 MG BY  MOUTH ONCE DAILY AT 6PM) 30 tablet 11  . phenazopyridine (PYRIDIUM) 100 MG tablet Take 1 tablet (100 mg total) by mouth 3 (three) times daily as needed for pain. 30 tablet 1  . sildenafil (VIAGRA) 100 MG tablet Take 1 tablet (100 mg total) by mouth as needed for erectile dysfunction. 12 tablet 5  . tamsulosin (FLOMAX) 0.4 MG CAPS capsule Take 1 capsule (0.4 mg total) by mouth daily. 30 capsule 3   No current facility-administered medications on file prior to visit.    Review of Systems  Constitutional: Negative for unusual diaphoresis or night sweats HENT: Negative for ear swelling or discharge Eyes: Negative for worsening visual haziness  Respiratory: Negative for choking and stridor.   Gastrointestinal: Negative for distension or worsening eructation Genitourinary: Negative for retention or change in urine volume.  Musculoskeletal: Negative for other MSK pain or swelling Skin: Negative for color change and worsening wound Neurological: Negative for tremors and numbness other than noted  Psychiatric/Behavioral: Negative for decreased concentration or agitation other than above   All other system neg per pt    Objective:   Physical Exam BP 140/78   Pulse 79   Temp 98.4 F (36.9 C) (Oral)   Resp 20   Wt 235 lb (106.6 kg)   SpO2 94%   BMI 34.70 kg/m ' VS noted, mild ill apperaing Constitutional: Pt appears in no apparent distress,  HENT: Head: NCAT.  Right Ear: External ear normal.  Left Ear: External ear normal. bilat canals cleared of wax impactions with hearing improved  Eyes: . Pupils are equal, round, and reactive to light. Conjunctivae and EOM are normal Bilat tm's with mild erythema.  Max sinus areas mild tender.  Pharynx with mild erythema, no exudate Neck: Normal range of motion. Neck supple. with bilat submandibular tender LA Cardiovascular: Normal rate and regular rhythm.   Pulmonary/Chest: Effort normal and breath sounds without rales or wheezing.  Neurological: Pt  is alert. Not confused , motor grossly intact Skin: Skin is warm. No rash, no LE edema Psychiatric: Pt behavior is normal. No agitation.  No other new exam findings     Assessment & Plan:

## 2016-01-20 NOTE — Patient Instructions (Signed)
Please take all new medication as prescribed - the antibiotic, and nasacort as directed  Your ears were irrigated of wax today  Please continue all other medications as before, and refills have been done if requested.  Please have the pharmacy call with any other refills you may need.  Please keep your appointments with your specialists as you may have planned

## 2016-01-20 NOTE — Assessment & Plan Note (Signed)
bilat recurrent, improved with irriagation, hearing improved

## 2016-01-20 NOTE — Assessment & Plan Note (Signed)
Mild to mod, for antibx course,  to f/u any worsening symptoms or concerns 

## 2016-01-20 NOTE — Progress Notes (Signed)
Pre visit review using our clinic review tool, if applicable. No additional management support is needed unless otherwise documented below in the visit note. 

## 2016-01-20 NOTE — Assessment & Plan Note (Signed)
Urged to stop., will try to transition to nasaocrt asd,  to f/u any worsening symptoms or concerns

## 2016-01-27 DIAGNOSIS — C61 Malignant neoplasm of prostate: Secondary | ICD-10-CM | POA: Diagnosis not present

## 2016-02-02 DIAGNOSIS — C61 Malignant neoplasm of prostate: Secondary | ICD-10-CM | POA: Diagnosis not present

## 2016-02-02 DIAGNOSIS — N400 Enlarged prostate without lower urinary tract symptoms: Secondary | ICD-10-CM | POA: Diagnosis not present

## 2016-02-12 ENCOUNTER — Telehealth: Payer: Self-pay | Admitting: Internal Medicine

## 2016-02-12 NOTE — Telephone Encounter (Signed)
Pt came in and needs refill on his Hydrocodone 325mg 

## 2016-02-12 NOTE — Telephone Encounter (Signed)
OK to fill this/these prescription(s) with additional refills x0 Needs to have an OV every 3 months Thank you!  

## 2016-02-15 MED ORDER — HYDROCODONE-ACETAMINOPHEN 5-325 MG PO TABS: 0.5000 | ORAL_TABLET | Freq: Four times a day (QID) | ORAL | 0 refills | Status: DC | PRN

## 2016-02-15 NOTE — Telephone Encounter (Signed)
Rx printed/signed/upfront for p/u. Left detailed mess informing pt.  

## 2016-02-19 DIAGNOSIS — G4733 Obstructive sleep apnea (adult) (pediatric): Secondary | ICD-10-CM | POA: Diagnosis not present

## 2016-03-05 ENCOUNTER — Encounter (HOSPITAL_COMMUNITY): Payer: Self-pay | Admitting: Emergency Medicine

## 2016-03-05 ENCOUNTER — Emergency Department (HOSPITAL_COMMUNITY)
Admission: EM | Admit: 2016-03-05 | Discharge: 2016-03-05 | Disposition: A | Discharge status: Home / Self Care | Payer: Medicare Other | Attending: Emergency Medicine | Admitting: Emergency Medicine

## 2016-03-05 DIAGNOSIS — R319 Hematuria, unspecified: Secondary | ICD-10-CM | POA: Diagnosis not present

## 2016-03-05 DIAGNOSIS — R339 Retention of urine, unspecified: Secondary | ICD-10-CM | POA: Insufficient documentation

## 2016-03-05 LAB — CBC WITH DIFFERENTIAL/PLATELET
Basophils Absolute: 0 10*3/uL (ref 0.0–0.1)
Basophils Relative: 0 %
EOS ABS: 0.1 10*3/uL (ref 0.0–0.7)
Eosinophils Relative: 3 %
HCT: 33.4 % — ABNORMAL LOW (ref 39.0–52.0)
HEMOGLOBIN: 11.4 g/dL — AB (ref 13.0–17.0)
Lymphocytes Relative: 20 %
Lymphs Abs: 1.1 10*3/uL (ref 0.7–4.0)
MCH: 26.6 pg (ref 26.0–34.0)
MCHC: 34.1 g/dL (ref 30.0–36.0)
MCV: 77.9 fL — ABNORMAL LOW (ref 78.0–100.0)
MONO ABS: 0.4 10*3/uL (ref 0.1–1.0)
MONOS PCT: 8 %
NEUTROS PCT: 69 %
Neutro Abs: 3.9 10*3/uL (ref 1.7–7.7)
Platelets: 214 10*3/uL (ref 150–400)
RBC: 4.29 MIL/uL (ref 4.22–5.81)
RDW: 14.6 % (ref 11.5–15.5)
WBC: 5.6 10*3/uL (ref 4.0–10.5)

## 2016-03-05 LAB — URINALYSIS, ROUTINE W REFLEX MICROSCOPIC
BILIRUBIN URINE: NEGATIVE
Bacteria, UA: NONE SEEN
GLUCOSE, UA: NEGATIVE mg/dL
Ketones, ur: NEGATIVE mg/dL
NITRITE: NEGATIVE
PH: 7 (ref 5.0–8.0)
Protein, ur: 100 mg/dL — AB
SPECIFIC GRAVITY, URINE: 1.009 (ref 1.005–1.030)
Squamous Epithelial / LPF: NONE SEEN

## 2016-03-05 LAB — I-STAT CHEM 8, ED
BUN: 14 mg/dL (ref 6–20)
CALCIUM ION: 1.16 mmol/L (ref 1.15–1.40)
Chloride: 105 mmol/L (ref 101–111)
Creatinine, Ser: 1.1 mg/dL (ref 0.61–1.24)
Glucose, Bld: 123 mg/dL — ABNORMAL HIGH (ref 65–99)
HCT: 33 % — ABNORMAL LOW (ref 39.0–52.0)
Hemoglobin: 11.2 g/dL — ABNORMAL LOW (ref 13.0–17.0)
Potassium: 3.1 mmol/L — ABNORMAL LOW (ref 3.5–5.1)
SODIUM: 143 mmol/L (ref 135–145)
TCO2: 26 mmol/L (ref 0–100)

## 2016-03-05 NOTE — ED Notes (Signed)
Pt standing, pacing and in pain and trying to void into a urinal but unable.

## 2016-03-05 NOTE — ED Notes (Signed)
Unable to triage PT currently in restroom

## 2016-03-05 NOTE — ED Notes (Signed)
Pt given leg bag to take home.  Pt states he has had a foley in the past and knows how to use leg bag, how to empty it and how to maintain a foley catheter as well.

## 2016-03-05 NOTE — ED Notes (Signed)
Pt given crackers & peanut butter w/milk as requested and ok by EDP.

## 2016-03-05 NOTE — ED Triage Notes (Signed)
Pt reports hematuria since yesterday. Pt also reports trouble urinating. Bladder scan 277ml. No flank pain.

## 2016-03-05 NOTE — ED Provider Notes (Signed)
[facility] DEPT Provider Note   CSN: [id number] Arrival date & time: 03/05/16  1054     History   Chief Complaint Chief Complaint  Patient presents with  . Hematuria    HPI Jeffrey Carter is a 76 y.o. male.  HPI Patient presents with hematuria. Began yesterday. Also has had some difficulty urinating and feels that they could be backed up. Previous history of prostate issues and had prostate surgery and Dr. Su Grand. Has had some bleeding in the past. No bleeding from other sites. No fevers.   Past Medical History:  Diagnosis Date  . Bladder outlet obstruction   . BPH (benign prostatic hyperplasia)   . Conjunctivitis, acute, bilateral    07-02-2015  per pcp note and started antibiotic drops  . First degree heart block   . History of acute conjunctivitis    07-02-2015  resolved after round of antibiotic eye drops  . History of gout    BIG TOE  . History of urinary retention 12/2014  . Hyperlipidemia   . Hypertension   . Lower urinary tract symptoms (LUTS)   . OSA on CPAP    MODERATE PER STUDY 03-08-2012  . Osteoarthritis   . PAC (premature atrial contraction)   . Prostate cancer [facility]) urologist-  dr budzyn/  oncologist-  dr Ander Slade   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),  Gleason 3+4,  PSA 10.57,  vol 166ml //   External beam radiation therapy  08-27-2014 to 10-22-2014  . S/P radiation therapy 08-27-2014  to  10-22-2014   prostate 7800Gy in 40 sessions and seminal vesicals 5000Gy in 40 sessions  . Wears glasses     Patient Active Problem List   Diagnosis Date Noted  . 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 Dysuria 01/11/2016  . PAC (premature atrial contraction) 06/18/2015  . Acute sinusitis 12/31/2014  . Urination frequency 09/15/2014  . Cerumen impaction 07/14/2014  . Prostate cancer ([place]) 04/14/2014  . Low back pain radiating to right lower extremity 03/13/2014  . Mass of ear canal 01/29/2014  . Otitis, externa, infective 01/22/2014  . Edema 07/05/2013  . Erectile  dysfunction 07/05/2013  . Gout of big toe 03/23/2013  . OSA (obstructive sleep apnea) 04/18/2012  . 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  . 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 Dyslipidemia 10/27/2006  . Essential hypertension 09/09/2006    Past Surgical History:  Procedure Laterality Date  . APPENDECTOMY  1966  . COLONOSCOPY  07-06-2006  . GOLD SEED IMPLANT N/A 08/12/2014   Procedure: GOLD SEED IMPLANT;  Surgeon: Lowella Bandy, MD;  Location: [facility];  Service: Urology;  Laterality: N/A;  . GREEN LIGHT LASER TURP (TRANSURETHRAL RESECTION OF PROSTATE N/A 07/20/2015   Procedure: GREEN LIGHT LASER ABLATION OF PROSTATE ;  Surgeon: Nickie Retort, MD;  Location[facility];  Service: Urology;  Laterality: N/A;  . PROSTATE BIOPSY N/A 03/28/2014   Procedure: BIOPSY TRANSRECTAL ULTRASONIC PROSTATE (TUBP);  Surgeon: Arvil Persons, MD;  Location: [facility];  Service: Urology;  Laterality: N/A;  . TONSILLECTOMY  as child  . TOTAL KNEE ARTHROPLASTY Right 06-22-2007       Home Medications    Prior to Admission medications   Medication Sig Start Date End Date Taking? Authorizing Provider  amLODipine (NORVASC) 5 MG tablet Take 1 tablet (5 mg total) by mouth daily. 01/11/16  Yes Aleksei Plotnikov V, MD  aspirin 81 MG tablet Take 81 mg by mouth daily.     Yes Historical  Provider, MD  cloNIDine (CATAPRES) 0.3 MG tablet Take 1 tablet (0.3 mg total) by mouth 2 (two) times daily. 03/18/15  Yes Aleksei Plotnikov V, MD  finasteride (PROSCAR) 5 MG tablet Take 5 mg by mouth every morning.  12/10/14  Yes Historical Provider, MD  fluticasone (FLONASE) 50 MCG/ACT nasal spray Place 2 sprays into both nostrils daily. 03/11/15  Yes Chesley Mires, MD  furosemide (LASIX) 40 MG tablet TAKE ONE TABLET BY MOUTH ONCE DAILY AS NEEDED FOR SWELLING 12/09/15  Yes Aleksei Plotnikov V, MD  HYDROcodone-acetaminophen (NORCO) 5-325 MG tablet Take 0.5-1 tablets by mouth  every 6 (six) hours as needed for moderate pain. 02/15/16  Yes Aleksei Plotnikov V, MD  loratadine (CLARITIN) 10 MG tablet TAKE ONE TABLET BY MOUTH ONCE DAILY 12/09/15  Yes Aleksei Plotnikov V, MD  losartan (COZAAR) 100 MG tablet Take 1 tablet (100 mg total) by mouth daily. 03/18/15  Yes Aleksei Plotnikov V, MD  lovastatin (MEVACOR) 40 MG tablet TAKE ONE TABLET BY MOUTH ONCE DAILY AT 6PM Patient taking differently: TAKE 40 MG BY MOUTH ONCE DAILY AT [facility] 05/14/15  Yes Aleksei Plotnikov V, MD  phenazopyridine (PYRIDIUM) 100 MG tablet Take 1 tablet (100 mg total) by mouth 3 (three) times daily as needed for pain. 01/11/16 01/10/17 Yes Aleksei Plotnikov V, MD  sildenafil (VIAGRA) 100 MG tablet Take 1 tablet (100 mg total) by mouth as needed for erectile dysfunction. 01/11/16  Yes Aleksei Plotnikov V, MD  tamsulosin (FLOMAX) 0.4 MG CAPS capsule Take 1 capsule (0.4 mg total) by mouth daily. 09/15/14  Yes Arloa Koh, MD  azithromycin [facility]) 250 MG tablet 2 tab by mouth day 1, then 1 per day Patient not taking: Reported on 03/05/2016 01/20/16   Biagio Borg, MD  triamcinolone (NASACORT AQ) 55 MCG/ACT AERO nasal inhaler Place 2 sprays into the nose daily. Patient not taking: Reported on 03/05/2016 01/20/16   Biagio Borg, MD    Family History Family History  Problem Relation Age of Onset  . Cancer Brother     pancreatic, stomach  . Diabetes Father     Social History Social History  Substance Use Topics  . Smoking status: Former Smoker    Packs/day: 0.25    Years: 8.00    Types: Cigarettes    Quit date: 01/24/1962  . Smokeless tobacco: Never Used  . Alcohol use No     Allergies   Patient has no known allergies.   Review of Systems Review of Systems  Constitutional: Negative for appetite change.  HENT: Negative for congestion.   Respiratory: Negative for shortness of breath.   Cardiovascular: Negative for leg swelling.  Gastrointestinal: Positive for abdominal pain.  Genitourinary:  Positive for hematuria.  Musculoskeletal: Negative for back pain.  Neurological: Negative for weakness, numbness and headaches.  Hematological: Negative for adenopathy.  Psychiatric/Behavioral: Negative for confusion.     Physical Exam Updated Vital Signs BP 147/79 (BP Location: Left Arm)   Pulse 74   Temp 97.6 F (36.4 C)   Resp 18   SpO2 98%   Physical Exam  Constitutional: He appears well-developed.  HENT:  Head: Atraumatic.  Eyes: Pupils are equal, round, and reactive to light.  Neck: Neck supple.  Cardiovascular: Normal rate.   Pulmonary/Chest: Effort normal.  Abdominal: Soft. He exhibits mass.  Genitourinary: Penis normal.  Musculoskeletal: He exhibits no edema or tenderness.  Neurological: He is alert.  Skin: Skin is warm. Capillary refill takes less than 2 seconds.     ED  Treatments / Results  Labs (all labs ordered are listed, but only abnormal results are displayed) Labs Reviewed  URINALYSIS, ROUTINE W REFLEX MICROSCOPIC - Abnormal; Notable for the following:       Result Value   Color, Urine RED (*)    APPearance CLOUDY (*)    Hgb urine dipstick LARGE (*)    Protein, ur 100 (*)    Leukocytes, UA LARGE (*)    All other components within normal limits  CBC WITH DIFFERENTIAL/PLATELET - Abnormal; Notable for the following:    Hemoglobin 11.4 (*)    HCT 33.4 (*)    MCV 77.9 (*)    All other components within normal limits  I-STAT CHEM 8, ED - Abnormal; Notable for the following:    Potassium 3.1 (*)    Glucose, Bld 123 (*)    Hemoglobin 11.2 (*)    HCT 33.0 (*)    All other components within normal limits    EKG  EKG Interpretation None       Radiology No results found.  Procedures Procedures (including critical care time)  Medications Ordered in ED Medications - No data to display   Initial Impression / Assessment and Plan / ED Course  I have reviewed the triage vital signs and the nursing notes.  Pertinent labs & imaging results  that were available during my care of the patient were reviewed by me and considered in my medical decision making (see chart for details).     Patient presents with hematuria and urinary retention. Previous history of urinary retention but had improved after prostate surgery. Initially drained with in and out. Patient later was unable to urinate. Foley catheter placed discharge home. No clear infection. Discharge home to follow-up with his urologist.  Final Clinical Impressions(s) / ED Diagnoses   Final diagnoses:  Hematuria, unspecified type  Urinary retention    New Prescriptions New Prescriptions   No medications on file     Davonna Belling, MD 03/05/16 [phone]

## 2016-03-09 DIAGNOSIS — R338 Other retention of urine: Secondary | ICD-10-CM | POA: Diagnosis not present

## 2016-03-09 DIAGNOSIS — R310 Gross hematuria: Secondary | ICD-10-CM | POA: Diagnosis not present

## 2016-03-15 ENCOUNTER — Ambulatory Visit (INDEPENDENT_AMBULATORY_CARE_PROVIDER_SITE_OTHER): Payer: Medicare Other | Admitting: Pulmonary Disease

## 2016-03-15 ENCOUNTER — Encounter: Payer: Self-pay | Admitting: Pulmonary Disease

## 2016-03-15 ENCOUNTER — Other Ambulatory Visit: Payer: Self-pay | Admitting: Internal Medicine

## 2016-03-15 VITALS — BP 158/84 | HR 53 | Ht 69.0 in | Wt 221.6 lb

## 2016-03-15 DIAGNOSIS — Z9989 Dependence on other enabling machines and devices: Secondary | ICD-10-CM | POA: Diagnosis not present

## 2016-03-15 DIAGNOSIS — G4733 Obstructive sleep apnea (adult) (pediatric): Secondary | ICD-10-CM | POA: Diagnosis not present

## 2016-03-15 NOTE — Progress Notes (Signed)
Current Outpatient Prescriptions on File Prior to Visit  Medication Sig  . amLODipine (NORVASC) 5 MG tablet Take 1 tablet (5 mg total) by mouth daily.  Marland Kitchen aspirin 81 MG tablet Take 81 mg by mouth daily.    . cloNIDine (CATAPRES) 0.3 MG tablet Take 1 tablet (0.3 mg total) by mouth 2 (two) times daily.  . finasteride (PROSCAR) 5 MG tablet Take 5 mg by mouth every morning.   . fluticasone (FLONASE) 50 MCG/ACT nasal spray Place 2 sprays into both nostrils daily.  . furosemide (LASIX) 40 MG tablet TAKE ONE TABLET BY MOUTH ONCE DAILY AS NEEDED FOR SWELLING  . HYDROcodone-acetaminophen (NORCO) 5-325 MG tablet Take 0.5-1 tablets by mouth every 6 (six) hours as needed for moderate pain.  Marland Kitchen loratadine (CLARITIN) 10 MG tablet TAKE ONE TABLET BY MOUTH ONCE DAILY  . losartan (COZAAR) 100 MG tablet Take 1 tablet (100 mg total) by mouth daily.  Marland Kitchen lovastatin (MEVACOR) 40 MG tablet TAKE ONE TABLET BY MOUTH ONCE DAILY AT 6PM (Patient taking differently: TAKE 40 MG BY MOUTH ONCE DAILY AT 6PM)  . phenazopyridine (PYRIDIUM) 100 MG tablet Take 1 tablet (100 mg total) by mouth 3 (three) times daily as needed for pain.  . sildenafil (VIAGRA) 100 MG tablet Take 1 tablet (100 mg total) by mouth as needed for erectile dysfunction.  . tamsulosin (FLOMAX) 0.4 MG CAPS capsule Take 1 capsule (0.4 mg total) by mouth daily.  Marland Kitchen triamcinolone (NASACORT AQ) 55 MCG/ACT AERO nasal inhaler Place 2 sprays into the nose daily.   No current facility-administered medications on file prior to visit.      Chief Complaint  Patient presents with  . Follow-up    Wears CPAP machine nightly. Denies problems with mask/pressure.  DME: [employer].     Sleep tests PSG 02/29/12 >> AHI 32, SpO2 low 79%  Past medical history Prostate cancer, BPH, Gout, HLD, HTN, OA  Past surgical history, Family history, Social history, Allergies all reviewed.  Vital Signs BP (!) 158/84 (BP Location: Right Arm, Cuff Size: Normal)   Pulse (!) 53   Ht 5\' 9"   (1.753 m)   Wt 221 lb 9.6 oz (100.5 kg)   SpO2 96%   BMI 32.72 kg/m   History of Present Illness Jeffrey Carter is a 75 y.o. male with obstructive sleep apnea.  He is doing well with CPAP.  He uses full face mask.  Goes to bed at 11 pm.  Falls asleep quickly.  Wakes up 1 or 2 times to use bathroom.  Gets out of bed at 7 am.  Feels rested.  Naps in afternoon, and uses CPAP then.  Uses CPAP whenever he is asleep.   Physical Exam  General - No distress Eyes - wears glasses ENT - No sinus tenderness, no oral exudate, no LAN, poor dentition Cardiac - s1s2 regular, no murmur Chest - No wheeze/rales/dullness Back - No focal tenderness Abd - Soft, non-tender Ext - No edema Neuro - Normal strength Skin - No rashes Psych - normal mood, and behavior   Assessment/Plan  Obstructive sleep apnea. - he is compliant with CPAP and reports benefit from therapy - will call him with report of download - he might be eligible for new machine in 2019   Patient Instructions  Will call with results of CPAP report  Follow up in 1 year    Chesley Mires, MD [place] Pager:  [phone] 03/15/2016, 9:39 AM

## 2016-03-15 NOTE — Patient Instructions (Signed)
Will call with results of CPAP report  Follow up in 1 year 

## 2016-03-17 DIAGNOSIS — R338 Other retention of urine: Secondary | ICD-10-CM | POA: Diagnosis not present

## 2016-03-18 NOTE — Progress Notes (Signed)
Subjective:   Jeffrey Carter is a 76 y.o. male who presents for Medicare Annual/Subsequent preventive examination.  Review of Systems:  No ROS.  Medicare Wellness Visit.  Cardiac Risk Factors include: dyslipidemia;male gender;advanced age (>62men, >54 women);hypertension;family history of premature cardiovascular disease Sleep patterns: no sleep issues, feels rested on waking, gets up 1-2 times nightly to void and sleeps 8 hours nightly.  Wears C-pap Home Safety/Smoke Alarms: Feels safe in home. Smoke alarms in place.    Living environment; residence and Firearm Safety: 1-story house/ trailer, no firearms. Lives with wife Seat Belt Safety/Bike Helmet: Wears seat belt.   Counseling:   Eye Exam-  Goes every 2 years will make an appointment,  Dental-  States he needs to go, resources given   Male:   CCS-  Last 07/06/06, diverticulosis, recall 10 years   PSA-  Lab Results  Component Value Date   PSA 5.25 (H) 12/11/2013   PSA 7.33 (H) 04/03/2013   PSA 5.07 (H) 05/30/2006        Objective:    Vitals: BP (!) 146/80   Pulse 68   Resp 18   Ht 5\' 9"  (0000000 m)   Wt 229 lb (103.9 kg)   SpO2 99%   BMI 33.82 kg/m   Body mass index is 33.82 kg/m.  Tobacco History  Smoking Status  . Former Smoker  . Packs/day: 0.25  . Years: 8.00  . Types: Cigarettes  . Quit date: 01/24/1962  Smokeless Tobacco  . Never Used     Counseling given: Not Answered   Past Medical History:  Diagnosis Date  . Bladder outlet obstruction   . BPH (benign prostatic hyperplasia)   . Conjunctivitis, acute, bilateral    07-02-2015  per pcp note and started antibiotic drops  . First degree heart block   . History of acute conjunctivitis    07-02-2015  resolved after round of antibiotic eye drops  . History of gout    BIG TOE  . History of urinary retention 12/2014  . Hyperlipidemia   . Hypertension   . Lower urinary tract symptoms (LUTS)   . OSA on CPAP    MODERATE PER STUDY 03-08-2012  .  Osteoarthritis   . PAC (premature atrial contraction)   . 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 S/P radiation therapy 08-27-2014  to  10-22-2014   prostate 7800Gy in 40 sessions and seminal vesicals 5000Gy in 40 sessions  . Wears glasses    Past Surgical History:  Procedure Laterality Date  . APPENDECTOMY  1966  . COLONOSCOPY  07-06-2006  . GOLD SEED IMPLANT N/A 08/12/2014   Procedure: GOLD SEED IMPLANT;  Surgeon: Lowella Bandy, MD;  Location: [facility];  Service: Urology;  Laterality: N/A;  . GREEN LIGHT LASER TURP (TRANSURETHRAL RESECTION OF PROSTATE N/A 07/20/2015   Procedure: GREEN LIGHT LASER ABLATION OF PROSTATE ;  Surgeon: Nickie Retort, MD;  Location: [facility];  Service: Urology;  Laterality: N/A;  . PROSTATE BIOPSY N/A 03/28/2014   Procedure: BIOPSY TRANSRECTAL ULTRASONIC PROSTATE (TUBP);  Surgeon: Arvil Persons, MD;  [facility];  Service: Urology;  Laterality: N/A;  . TONSILLECTOMY  as child  . TOTAL KNEE ARTHROPLASTY Right 06-22-2007   Family History  Problem Relation Age of  Onset  . Diabetes Father   . Cancer Brother     pancreatic, stomach   History  Sexual Activity  . Sexual activity: Not on file    Outpatient Encounter Prescriptions as of 03/21/2016  Medication Sig  . amLODipine (NORVASC) 5 MG tablet Take 1 tablet (5 mg total) by mouth daily.  Marland Kitchen aspirin 81 MG tablet Take 81 mg by mouth daily.    . cloNIDine (CATAPRES) 0.3 MG tablet TAKE ONE TABLET BY MOUTH TWICE DAILY  . finasteride (PROSCAR) 5 MG tablet Take 5 mg by mouth every morning.   . fluticasone (FLONASE) 50 MCG/ACT nasal spray Place 2 sprays into both nostrils daily.  . furosemide (LASIX) 40 MG tablet TAKE ONE TABLET BY MOUTH ONCE DAILY AS NEEDED FOR SWELLING  .  HYDROcodone-acetaminophen (NORCO) 5-325 MG tablet Take 0.5-1 tablets by mouth every 6 (six) hours as needed for moderate pain.  Marland Kitchen loratadine (CLARITIN) 10 MG tablet TAKE ONE TABLET BY MOUTH ONCE DAILY  . losartan (COZAAR) 100 MG tablet TAKE ONE TABLET BY MOUTH ONCE DAILY  . lovastatin (MEVACOR) 40 MG tablet TAKE ONE TABLET BY MOUTH ONCE DAILY AT 6PM (Patient taking differently: TAKE 40 MG BY MOUTH ONCE DAILY AT 6PM)  . phenazopyridine (PYRIDIUM) 100 MG tablet Take 1 tablet (100 mg total) by mouth 3 (three) times daily as needed for pain.  . sildenafil (VIAGRA) 100 MG tablet Take 1 tablet (100 mg total) by mouth as needed for erectile dysfunction.  . tamsulosin (FLOMAX) 0.4 MG CAPS capsule Take 1 capsule (0.4 mg total) by mouth daily.  Marland Kitchen triamcinolone (NASACORT AQ) 55 MCG/ACT AERO nasal inhaler Place 2 sprays into the nose daily.   No facility-administered encounter medications on file as of 03/21/2016.     Activities of Daily Living In your present state of health, do you have any difficulty performing the following activities: 03/21/2016 07/20/2015  Hearing? N Y  Vision? N N  Difficulty concentrating or making decisions? N N  Walking or climbing stairs? N N  Dressing or bathing? N N  Doing errands, shopping? N -  Preparing Food and eating ? N -  Using the Toilet? N -  In the past six months, have you accidently leaked urine? Y -  Do you have problems with loss of bowel control? N -  Managing your Medications? N -  Managing your Finances? N -  Housekeeping or managing your Housekeeping? N -  Some recent data might be hidden    Patient Care Team: Cassandria Anger, MD as PCP - General (Internal Medicine) Earlie Server, MD (Orthopedic Surgery)   Assessment:    Physical assessment deferred to PCP.  Exercise Activities and Dietary recommendations Current Exercise Habits: The patient does not participate in regular exercise at present, Exercise limited by: orthopedic  condition(s)  Diet (meal preparation, eat out, water intake, caffeinated beverages, dairy products, fruits and vegetables): low salt  Well balance, generally healthy, 4, 16  ounce bottles of water per day. Follows low salt, low cholesterol diet.    Goals    . Weight (lb) < 219 lb (99.3 kg)          Start to walk at the [facility] once a week. Increase water. Eat low salt, low cholesterol diet.       Fall Risk Fall Risk  03/21/2016 01/11/2016 11/25/2014 07/22/2014 04/16/2014  Falls in the past year? No No No No No   Depression Screen [facility] 2/9 Scores 03/21/2016 01/11/2016 11/25/2014 07/22/2014  PHQ - 2 Score 1 0 0 0    Cognitive Function MMSE - Mini Mental State Exam 03/21/2016  Orientation to time 5  Orientation to Place 5  Registration 3  Attention/ Calculation 5  Recall 1  Language- name 2 objects 2  Language- repeat 1  Language- follow 3 step command 3  Language- read & follow direction 1  Write a sentence 1  Copy design 1  Total score 28        Immunization History  Administered Date(s) Administered  . Influenza Split 11/25/2010, 11/03/2011  . Influenza Whole 10/24/2007, 10/30/2008, 09/24/2009  . Influenza, High Dose Seasonal PF 09/22/2015  . Influenza,inj,Quad PF,36+ Mos 10/17/2012, 10/11/2013, 10/09/2014  . PPD Test 10/29/2013, 10/29/2013  . Pneumococcal Conjugate-13 02/20/2013  . Pneumococcal Polysaccharide-23 10/30/2008, 10/12/2015  . Td 10/30/2008  . Tdap 11/25/2010  . Zoster 03/13/2013   Screening Tests Health Maintenance  Topic Date Due  . COLONOSCOPY  07/05/2016  . TETANUS/TDAP  11/24/2020  . INFLUENZA VACCINE  Completed  . PNA vac Low Risk Adult  Completed      Plan:     Continue to eat heart healthy diet (full of fruits, vegetables, whole grains, lean protein, water--limit salt, fat, and sugar intake) and increase physical activity as tolerated.  Continue doing brain stimulating activities (puzzles, reading, adult coloring books, staying active)  to keep memory sharp.    Call [employer] to ask about Silver Sneakers  During the course of the visit the patient was educated and counseled about the following appropriate screening and preventive services:   Vaccines to include Pneumoccal, Influenza, Hepatitis B, Td, Zostavax, HCV  Cardiovascular Disease  Colorectal cancer screening  Diabetes screening  Prostate Cancer Screening  Glaucoma screening  Nutrition counseling   Patient Instructions (the written plan) was given to the patient.    Michiel Cowboy, RN  03/21/2016

## 2016-03-18 NOTE — Progress Notes (Signed)
Pre visit review using our clinic review tool, if applicable. No additional management support is needed unless otherwise documented below in the visit note. 

## 2016-03-21 ENCOUNTER — Ambulatory Visit: Payer: Medicare Other | Admitting: *Deleted

## 2016-03-21 VITALS — BP 146/80 | HR 68 | Resp 18 | Ht 69.0 in | Wt 229.0 lb

## 2016-03-21 DIAGNOSIS — Z0000 Encounter for general adult medical examination without abnormal findings: Secondary | ICD-10-CM

## 2016-03-21 DIAGNOSIS — G4733 Obstructive sleep apnea (adult) (pediatric): Secondary | ICD-10-CM | POA: Diagnosis not present

## 2016-03-21 NOTE — Patient Instructions (Addendum)
Continue to eat heart healthy diet (full of fruits, vegetables, whole grains, lean protein, water--limit salt, fat, and sugar intake) and increase physical activity as tolerated.  Continue doing brain stimulating activities (puzzles, reading, adult coloring books, staying active) to keep memory sharp.   Call [facility] to ask about Silver Sneakers DASH Eating Plan   Jeffrey Carter , Thank you for taking time to come for your Medicare Wellness Visit. I appreciate your ongoing commitment to your health goals. Please review the following plan we discussed and let me know if I can assist you in the future.   These are the goals we discussed: Goals    . Weight (lb) < 219 lb (99.3 kg)          Start to walk at the [facility] once a week. Increase water. Eat low salt, low cholesterol diet.        This is a list of the screening recommended for you and due dates:  Health Maintenance  Topic Date Due  . Colon Cancer Screening  07/05/2016  . Tetanus Vaccine  11/24/2020  . Flu Shot  Completed  . Pneumonia vaccines  Completed    DASH stands for "Dietary Approaches to Stop Hypertension." The DASH eating plan is a healthy eating plan that has been shown to reduce high blood pressure (hypertension). Additional health benefits may include reducing the risk of type 2 diabetes mellitus, heart disease, and stroke. The DASH eating plan may also help with weight loss. What do I need to know about the DASH eating plan? For the DASH eating plan, you will follow these general guidelines:  Choose foods with less than 150 milligrams of sodium per serving (as listed on the food label).  Use salt-free seasonings or herbs instead of table salt or sea salt.  Check with your health care provider or pharmacist before using salt substitutes.  Eat lower-sodium products. These are often labeled as "low-sodium" or "no salt added."  Eat fresh foods. Avoid eating a lot of canned foods.  Eat more vegetables, fruits, and  low-fat dairy products.  Choose whole grains. Look for the word "whole" as the first word in the ingredient list.  Choose fish and skinless chicken or [place] more often than red meat. Limit fish, poultry, and meat to 6 oz (170 g) each day.  Limit sweets, desserts, sugars, and sugary drinks.  Choose heart-healthy fats.  Eat more home-cooked food and less restaurant, buffet, and fast food.  Limit fried foods.  Do not fry foods. Cook foods using methods such as baking, boiling, grilling, and broiling instead.  When eating at a restaurant, ask that your food be prepared with less salt, or no salt if possible. What foods can I eat? Seek help from a dietitian for individual calorie needs. Grains  Whole grain or whole wheat bread. Brown rice. Whole grain or whole wheat pasta. Quinoa, bulgur, and whole grain cereals. Low-sodium cereals. Corn or whole wheat flour tortillas. Whole grain cornbread. Whole grain crackers. Low-sodium crackers. Vegetables  Fresh or frozen vegetables (raw, steamed, roasted, or grilled). Low-sodium or reduced-sodium tomato and vegetable juices. Low-sodium or reduced-sodium tomato sauce and paste. Low-sodium or reduced-sodium canned vegetables. Fruits  All fresh, canned (in natural juice), or frozen fruits. Meat and Other Protein Products  Ground beef (85% or leaner), grass-fed beef, or beef trimmed of fat. Skinless chicken or [place]. Ground chicken or [place]. Pork trimmed of fat. All fish and seafood. Eggs. Dried beans, peas, or lentils. Unsalted nuts and  seeds. Unsalted canned beans. Dairy  Low-fat dairy products, such as skim or 1% milk, 2% or reduced-fat cheeses, low-fat ricotta or cottage cheese, or plain low-fat yogurt. Low-sodium or reduced-sodium cheeses. Fats and Oils  Tub margarines without trans fats. Light or reduced-fat mayonnaise and salad dressings (reduced sodium). Avocado. Safflower, olive, or canola oils. Natural peanut or almond butter. Other   Unsalted popcorn and pretzels. The items listed above may not be a complete list of recommended foods or beverages. Contact your dietitian for more options.  What foods are not recommended? Grains  White bread. White pasta. White rice. Refined cornbread. Bagels and croissants. Crackers that contain trans fat. Vegetables  Creamed or fried vegetables. Vegetables in a cheese sauce. Regular canned vegetables. Regular canned tomato sauce and paste. Regular tomato and vegetable juices. Fruits  Canned fruit in light or heavy syrup. Fruit juice. Meat and Other Protein Products  Fatty cuts of meat. Ribs, chicken wings, bacon, sausage, bologna, salami, chitterlings, fatback, hot dogs, bratwurst, and packaged luncheon meats. Salted nuts and seeds. Canned beans with salt. Dairy  Whole or 2% milk, cream, half-and-half, and cream cheese. Whole-fat or sweetened yogurt. Full-fat cheeses or blue cheese. Nondairy creamers and whipped toppings. Processed cheese, cheese spreads, or cheese curds. Condiments  Onion and garlic salt, seasoned salt, table salt, and sea salt. Canned and packaged gravies. Worcestershire sauce. Tartar sauce. Barbecue sauce. Teriyaki sauce. Soy sauce, including reduced sodium. Steak sauce. Fish sauce. Oyster sauce. Cocktail sauce. Horseradish. Ketchup and mustard. Meat flavorings and tenderizers. Bouillon cubes. Hot sauce. Tabasco sauce. Marinades. Taco seasonings. Relishes. Fats and Oils  Butter, stick margarine, lard, shortening, ghee, and bacon fat. Coconut, palm kernel, or palm oils. Regular salad dressings. Other  Pickles and olives. Salted popcorn and pretzels. The items listed above may not be a complete list of foods and beverages to avoid. Contact your dietitian for more information.  Where can I find more information? National Heart, Lung, and Blood Institute: travelstabloid.com This information is not intended to replace advice given to you by  your health care provider. Make sure you discuss any questions you have with your health care provider. Document Released: 12/30/2010 Document Revised: 06/18/2015 Document Reviewed: 11/14/2012 Elsevier Interactive Patient Education  2017 [employer]  Fat and Cholesterol Restricted Diet High levels of fat and cholesterol in your blood may lead to various health problems, such as diseases of the heart, blood vessels, gallbladder, liver, and pancreas. Fats are concentrated sources of energy that come in various forms. Certain types of fat, including saturated fat, may be harmful in excess. Cholesterol is a substance needed by your body in small amounts. Your body makes all the cholesterol it needs. Excess cholesterol comes from the food you eat. When you have high levels of cholesterol and saturated fat in your blood, health problems can develop because the excess fat and cholesterol will gather along the walls of your blood vessels, causing them to narrow. Choosing the right foods will help you control your intake of fat and cholesterol. This will help keep the levels of these substances in your blood within normal limits and reduce your risk of disease. What is my plan? Your health care provider recommends that you:  Limit your fat intake to ______% or less of your total calories per day.  Limit the amount of cholesterol in your diet to less than _________mg per day.  Eat 20-30 grams of fiber each day. What types of fat should I choose?  Choose healthy fats more  often. Choose monounsaturated and polyunsaturated fats, such as olive and canola oil, flaxseeds, walnuts, almonds, and seeds.  Eat more omega-3 fats. Good choices include salmon, mackerel, sardines, tuna, flaxseed oil, and ground flaxseeds. Aim to eat fish at least two times a week.  Limit saturated fats. Saturated fats are primarily found in animal products, such as meats, butter, and cream. Plant sources of saturated fats include  palm oil, palm kernel oil, and coconut oil.  Avoid foods with partially hydrogenated oils in them. These contain trans fats. Examples of foods that contain trans fats are stick margarine, some tub margarines, cookies, crackers, and other baked goods. What general guidelines do I need to follow? These guidelines for healthy eating will help you control your intake of fat and cholesterol:  Check food labels carefully to identify foods with trans fats or high amounts of saturated fat.  Fill one half of your plate with vegetables and green salads.  Fill one fourth of your plate with whole grains. Look for the word "whole" as the first word in the ingredient list.  Fill one fourth of your plate with lean protein foods.  Limit fruit to two servings a day. Choose fruit instead of juice.  Eat more foods that contain fiber, such as apples, broccoli, carrots, beans, peas, and barley.  Eat more home-cooked food and less restaurant, buffet, and fast food.  Limit or avoid alcohol.  Limit foods high in starch and sugar.  Limit fried foods.  Cook foods using methods other than frying. Baking, boiling, grilling, and broiling are all great options.  Lose weight if you are overweight. Losing just 5-10% of your initial body weight can help your overall health and prevent diseases such as diabetes and heart disease. What foods can I eat? Grains  Whole grains, such as whole wheat or whole grain breads, crackers, cereals, and pasta. Unsweetened oatmeal, bulgur, barley, quinoa, or brown rice. Corn or whole wheat flour tortillas. Vegetables  Fresh or frozen vegetables (raw, steamed, roasted, or grilled). Green salads. Fruits  All fresh, canned (in natural juice), or frozen fruits. Meats and other protein foods  Ground beef (85% or leaner), grass-fed beef, or beef trimmed of fat. Skinless chicken or [place]. Ground chicken or [place]. Pork trimmed of fat. All fish and seafood. Eggs. Dried beans, peas,  or lentils. Unsalted nuts or seeds. Unsalted canned or dry beans. Dairy  Low-fat dairy products, such as skim or 1% milk, 2% or reduced-fat cheeses, low-fat ricotta or cottage cheese, or plain low-fat yo Fats and oils  Tub margarines without trans fats. Light or reduced-fat mayonnaise and salad dressings. Avocado. Olive, canola, sesame, or safflower oils. Natural peanut or almond butter (choose ones without added sugar and oil). The items listed above may not be a complete list of recommended foods or beverages. Contact your dietitian for more options.  Foods to avoid Grains  White bread. White pasta. White rice. Cornbread. Bagels, pastries, and croissants. Crackers that contain trans fat. Vegetables  White potatoes. Corn. Creamed or fried vegetables. Vegetables in a cheese sauce. Fruits  Dried fruits. Canned fruit in light or heavy syrup. Fruit juice. Meats and other protein foods  Fatty cuts of meat. Ribs, chicken wings, bacon, sausage, bologna, salami, chitterlings, fatback, hot dogs, bratwurst, and packaged luncheon meats. Liver and organ meats. Dairy  Whole or 2% milk, cream, half-and-half, and cream cheese. Whole milk cheeses. Whole-fat or sweetened yogurt. Full-fat cheeses. Nondairy creamers and whipped toppings. Processed cheese, cheese spreads, or cheese curds. Beverages  Alcohol. Sweetened drinks (such as sodas, lemonade, and fruit drinks or punches). Fats and oils  Butter, stick margarine, lard, shortening, ghee, or bacon fat. Coconut, palm kernel, or palm oils. Sweets and desserts  Corn syrup, sugars, honey, and molasses. Candy. Jam and jelly. Syrup. Sweetened cereals. Cookies, pies, cakes, donuts, muffins, and ice cream. The items listed above may not be a complete list of foods and beverages to avoid. Contact your dietitian for more information.  This information is not intended to replace advice given to you by your health care provider. Make sure you discuss any  questions you have with your health care provider. Document Released: 01/10/2005 Document Revised: 01/31/2014 Document Reviewed: 04/10/2013 Elsevier Interactive Patient Education  2017 [place] Maintenance, Male A healthy lifestyle and preventative care can promote health and wellness.  Maintain regular health, dental, and eye exams.  Eat a healthy diet. Foods like vegetables, fruits, whole grains, low-fat dairy products, and lean protein foods contain the nutrients you need and are low in calories. Decrease your intake of foods high in solid fats, added sugars, and salt. Get information about a proper diet from your health care provider, if necessary.  Regular physical exercise is one of the most important things you can do for your health. Most adults should get at least 150 minutes of moderate-intensity exercise (any activity that increases your heart rate and causes you to sweat) each week. In addition, most adults need muscle-strengthening exercises on 2 or more days a week.   Maintain a healthy weight. The body mass index (BMI) is a screening tool to identify possible weight problems. It provides an estimate of body fat based on height and weight. Your health care provider can find your BMI and can help you achieve or maintain a healthy weight. For males 20 years and older:  A BMI below 18.5 is considered underweight.  A BMI of 18.5 to 24.9 is normal.  A BMI of 25 to 29.9 is considered overweight.  A BMI of 30 and above is considered obese.  Maintain normal blood lipids and cholesterol by exercising and minimizing your intake of saturated fat. Eat a balanced diet with plenty of fruits and vegetables. Blood tests for lipids and cholesterol should begin at age 38 and be repeated every 5 years. If your lipid or cholesterol levels are high, you are over age 105, or you are at high risk for heart disease, you may need your cholesterol levels checked more frequently.Ongoing high  lipid and cholesterol levels should be treated with medicines if diet and exercise are not working.  If you smoke, find out from your health care provider how to quit. If you do not use tobacco, do not start.  Lung cancer screening is recommended for adults aged 73-80 years who are at high risk for developing lung cancer because of a history of smoking. A yearly low-dose CT scan of the lungs is recommended for people who have at least a 30-pack-year history of smoking and are current smokers or have quit within the past 15 years. A pack year of smoking is smoking an average of 1 pack of cigarettes a day for 1 year (for example, a 30-pack-year history of smoking could mean smoking 1 pack a day for 30 years or 2 packs a day for 15 years). Yearly screening should continue until the smoker has stopped smoking for at least 15 years. Yearly screening should be stopped for people who develop a health problem that would prevent  them from having lung cancer treatment.  If you choose to drink alcohol, do not have more than 2 drinks per day. One drink is considered to be 12 oz (360 mL) of beer, 5 oz (150 mL) of [place], or 1.5 oz (45 mL) of liquor.  Avoid the use of street drugs. Do not share needles with anyone. Ask for help if you need support or instructions about stopping the use of drugs.  High blood pressure causes heart disease and increases the risk of stroke. High blood pressure is more likely to develop in:  People who have blood pressure in the end of the normal range (100-139/85-89 mm Hg).  People who are overweight or obese.  People who are African American.  If you are 94-107 years of age, have your blood pressure checked every 3-5 years. If you are 32 years of age or older, have your blood pressure checked every year. You should have your blood pressure measured twice-once when you are at a hospital or clinic, and once when you are not at a hospital or clinic. Record the average of the two  measurements. To check your blood pressure when you are not at a hospital or clinic, you can use:  An automated blood pressure machine at a pharmacy.  A home blood pressure monitor.  If you are 32-55 years old, ask your health care provider if you should take aspirin to prevent heart disease.  Diabetes screening involves taking a blood sample to check your fasting blood sugar level. This should be done once every 3 years after age 41 if you are at a normal weight and without risk factors for diabetes. Testing should be considered at a younger age or be carried out more frequently if you are overweight and have at least 1 risk factor for diabetes.  Colorectal cancer can be detected and often prevented. Most routine colorectal cancer screening begins at the age of 42 and continues through age 82. However, your health care provider may recommend screening at an earlier age if you have risk factors for colon cancer. On a yearly basis, your health care provider may provide home test kits to check for hidden blood in the stool. A small camera at the end of a tube may be used to directly examine the colon (sigmoidoscopy or colonoscopy) to detect the earliest forms of colorectal cancer. Talk to your health care provider about this at age 81 when routine screening begins. A direct exam of the colon should be repeated every 5-10 years through age 26, unless early forms of precancerous polyps or small growths are found.  People who are at an increased risk for hepatitis B should be screened for this virus. You are considered at high risk for hepatitis B if:  You were born in a country where hepatitis B occurs often. Talk with your health care provider about which countries are considered high risk.  Your parents were born in a high-risk country and you have not received a shot to protect against hepatitis B (hepatitis B vaccine).  You have HIV or AIDS.  You use needles to inject street drugs.  You live  with, or have sex with, someone who has hepatitis B.  You are a man who has sex with other men (MSM).  You get hemodialysis treatment.  You take certain medicines for conditions like cancer, organ transplantation, and autoimmune conditions.  Hepatitis C blood testing is recommended for all people born from [birth year] through 1963 and any individual  with known risk factors for hepatitis C.  Healthy men should no longer receive prostate-specific antigen (PSA) blood tests as part of routine cancer screening. Talk to your health care provider about prostate cancer screening.  Testicular cancer screening is not recommended for adolescents or adult males who have no symptoms. Screening includes self-exam, a health care provider exam, and other screening tests. Consult with your health care provider about any symptoms you have or any concerns you have about testicular cancer.  Practice safe sex. Use condoms and avoid high-risk sexual practices to reduce the spread of sexually transmitted infections (STIs).  You should be screened for STIs, including gonorrhea and chlamydia if:  You are sexually active and are younger than 24 years.  You are older than 24 years, and your health care provider tells you that you are at risk for this type of infection.  Your sexual activity has changed since you were last screened, and you are at an increased risk for chlamydia or gonorrhea. Ask your health care provider if you are at risk.  If you are at risk of being infected with HIV, it is recommended that you take a prescription medicine daily to prevent HIV infection. This is called pre-exposure prophylaxis (PrEP). You are considered at risk if:  You are a man who has sex with other men (MSM).  You are a heterosexual man who is sexually active with multiple partners.  You take drugs by injection.  You are sexually active with a partner who has HIV.  Talk with your health care provider about whether you are at  high risk of being infected with HIV. If you choose to begin PrEP, you should first be tested for HIV. You should then be tested every 3 months for as long as you are taking PrEP.  Use sunscreen. Apply sunscreen liberally and repeatedly throughout the day. You should seek shade when your shadow is shorter than you. Protect yourself by wearing long sleeves, pants, a wide-brimmed hat, and sunglasses year round whenever you are outdoors.  Tell your health care provider of new moles or changes in moles, especially if there is a change in shape or color. Also, tell your health care provider if a mole is larger than the size of a pencil eraser.  A one-time screening for abdominal aortic aneurysm (AAA) and surgical repair of large AAAs by ultrasound is recommended for men aged 89-75 years who are current or former smokers.  Stay current with your vaccines (immunizations). This information is not intended to replace advice given to you by your health care provider. Make sure you discuss any questions you have with your health care provider. Document Released: 07/09/2007 Document Revised: 01/31/2014 Document Reviewed: 10/14/2014 Elsevier Interactive Patient Education  2017 [employer].

## 2016-04-13 ENCOUNTER — Telehealth: Payer: Self-pay | Admitting: Internal Medicine

## 2016-04-13 NOTE — Telephone Encounter (Signed)
Pt came by the office, needs refill of generic for Norco 5/325.  Please call when ready  [phone]

## 2016-04-13 NOTE — Telephone Encounter (Signed)
OK to fill this/these prescription(s) with additional refills x0 Needs to have an OV every 3 months Thank you!  

## 2016-04-14 ENCOUNTER — Encounter: Payer: Self-pay | Admitting: Internal Medicine

## 2016-04-14 ENCOUNTER — Other Ambulatory Visit: Payer: Self-pay

## 2016-04-14 MED ORDER — HYDROCODONE-ACETAMINOPHEN 5-325 MG PO TABS: 0.5000 | ORAL_TABLET | Freq: Four times a day (QID) | ORAL | 0 refills | Status: DC | PRN

## 2016-04-14 NOTE — Telephone Encounter (Signed)
Jeffrey Carter can you please take care of.

## 2016-04-14 NOTE — Telephone Encounter (Signed)
rx printed, dr Alain Marion has already left for today---I will have dr plotnikov sign tomorrow 3/23 and I will call patient to come pick up--or he can pick up at his appt on 3/29 with dr plotnikov---also patient needs to be advised that he must continue with every 3 mo follow up appts in order to keep getting refills

## 2016-04-15 NOTE — Telephone Encounter (Signed)
Advised patient that rx for norco is ready for pick up at front office, advised to keep routine OV with dr plot every 3 months, and also wrote that message on envelope containing script

## 2016-04-21 ENCOUNTER — Ambulatory Visit (INDEPENDENT_AMBULATORY_CARE_PROVIDER_SITE_OTHER): Payer: Medicare Other | Admitting: Internal Medicine

## 2016-04-21 ENCOUNTER — Other Ambulatory Visit (INDEPENDENT_AMBULATORY_CARE_PROVIDER_SITE_OTHER): Payer: Medicare Other

## 2016-04-21 ENCOUNTER — Encounter: Payer: Self-pay | Admitting: Internal Medicine

## 2016-04-21 DIAGNOSIS — I10 Essential (primary) hypertension: Secondary | ICD-10-CM | POA: Diagnosis not present

## 2016-04-21 DIAGNOSIS — E785 Hyperlipidemia, unspecified: Secondary | ICD-10-CM

## 2016-04-21 DIAGNOSIS — C61 Malignant neoplasm of prostate: Secondary | ICD-10-CM | POA: Diagnosis not present

## 2016-04-21 DIAGNOSIS — H6123 Impacted cerumen, bilateral: Secondary | ICD-10-CM | POA: Diagnosis not present

## 2016-04-21 DIAGNOSIS — E876 Hypokalemia: Secondary | ICD-10-CM | POA: Diagnosis not present

## 2016-04-21 LAB — BASIC METABOLIC PANEL
BUN: 12 mg/dL (ref 6–23)
CALCIUM: 9.3 mg/dL (ref 8.4–10.5)
CO2: 31 mEq/L (ref 19–32)
Chloride: 101 mEq/L (ref 96–112)
Creatinine, Ser: 1.22 mg/dL (ref 0.40–1.50)
GFR: 74.69 mL/min (ref >60.00)
GLUCOSE: 117 mg/dL — AB (ref 70–99)
POTASSIUM: 3.4 meq/L — AB (ref 3.5–5.1)
SODIUM: 139 meq/L (ref 135–145)

## 2016-04-21 NOTE — Assessment & Plan Note (Signed)
Repeat BMET 

## 2016-04-21 NOTE — Assessment & Plan Note (Signed)
Lovastatin 

## 2016-04-21 NOTE — Assessment & Plan Note (Signed)
Amlod, Catapress, Lasix, Losartan Risks associated with treatment noncompliance were discussed. Compliance was encouraged.

## 2016-04-21 NOTE — Assessment & Plan Note (Signed)
f/u w/Dr Pilar Jarvis

## 2016-04-21 NOTE — Assessment & Plan Note (Signed)
See procedure 

## 2016-04-21 NOTE — Progress Notes (Signed)
Pre-visit discussion using our clinic review tool. No additional management support is needed unless otherwise documented below in the visit note.  

## 2016-04-21 NOTE — Progress Notes (Signed)
Subjective:  Patient ID: Jeffrey Carter, male    DOB: Dec 30, 1940  Age: 76 y.o. MRN: [id number]  CC: Hypertension; Sleep Apnea; Edema; and Back Pain (Lower )   HPI [name] presents for HTN, prostate ca, dyslipidemia f/u  Outpatient Medications Prior to Visit  Medication Sig Dispense Refill  . amLODipine (NORVASC) 5 MG tablet Take 1 tablet (5 mg total) by mouth daily. 90 tablet 3  . aspirin 81 MG tablet Take 81 mg by mouth daily.      . cloNIDine (CATAPRES) 0.3 MG tablet TAKE ONE TABLET BY MOUTH TWICE DAILY 180 tablet 2  . finasteride (PROSCAR) 5 MG tablet Take 5 mg by mouth every morning.     . fluticasone (FLONASE) 50 MCG/ACT nasal spray Place 2 sprays into both nostrils daily. 16 g 2  . furosemide (LASIX) 40 MG tablet TAKE ONE TABLET BY MOUTH ONCE DAILY AS NEEDED FOR SWELLING 90 tablet 1  . HYDROcodone-acetaminophen (NORCO) 5-325 MG tablet Take 0.5-1 tablets by mouth every 6 (six) hours as needed for moderate pain. 60 tablet 0  . loratadine (CLARITIN) 10 MG tablet TAKE ONE TABLET BY MOUTH ONCE DAILY 100 tablet 3  . losartan (COZAAR) 100 MG tablet TAKE ONE TABLET BY MOUTH ONCE DAILY 90 tablet 2  . lovastatin (MEVACOR) 40 MG tablet TAKE ONE TABLET BY MOUTH ONCE DAILY AT 6PM (Patient taking differently: TAKE 40 MG BY MOUTH ONCE DAILY AT 6PM) 30 tablet 11  . phenazopyridine (PYRIDIUM) 100 MG tablet Take 1 tablet (100 mg total) by mouth 3 (three) times daily as needed for pain. 30 tablet 1  . sildenafil (VIAGRA) 100 MG tablet Take 1 tablet (100 mg total) by mouth as needed for erectile dysfunction. 12 tablet 5  . tamsulosin (FLOMAX) 0.4 MG CAPS capsule Take 1 capsule (0.4 mg total) by mouth daily. 30 capsule 3  . triamcinolone (NASACORT AQ) 55 MCG/ACT AERO nasal inhaler Place 2 sprays into the nose daily. 1 Inhaler 12   No facility-administered medications prior to visit.     ROS Review of Systems  Constitutional: Negative for appetite change, fatigue and unexpected weight  change.  HENT: Negative for congestion, nosebleeds, sneezing, sore throat and trouble swallowing.   Eyes: Negative for itching and visual disturbance.  Respiratory: Negative for cough.   Cardiovascular: Negative for chest pain, palpitations and leg swelling.  Gastrointestinal: Negative for abdominal distention, blood in stool, diarrhea and nausea.  Genitourinary: Negative for frequency and hematuria.  Musculoskeletal: Negative for back pain, gait problem, joint swelling and neck pain.  Skin: Negative for rash.  Neurological: Negative for dizziness, tremors, speech difficulty and weakness.  Psychiatric/Behavioral: Negative for agitation, dysphoric mood, sleep disturbance and suicidal ideas. The patient is not nervous/anxious.     Objective:  BP (!) 142/96   Pulse 68   Temp 98 F (36.7 C) (Oral)   Resp 16   Ht 5\' 9"  (1.753 m)   Wt 235 lb (106.6 kg)   SpO2 98%   BMI 34.70 kg/m   BP Readings from Last 3 Encounters:  04/21/16 (!) 142/96  03/21/16 (!) 146/80  03/15/16 (!) 158/84    Wt Readings from Last 3 Encounters:  04/21/16 235 lb (106.6 kg)  03/21/16 229 lb (103.9 kg)  03/15/16 221 lb 9.6 oz (100.5 kg)    Physical Exam  Constitutional: He is oriented to person, place, and time. He appears well-developed and well-nourished. No distress.  HENT:  Head: Normocephalic and atraumatic.  Right Ear: External  ear normal.  Left Ear: External ear normal.  Nose: Nose normal.  Mouth/Throat: Oropharynx is clear and moist. No oropharyngeal exudate.  Eyes: Conjunctivae and EOM are normal. Pupils are equal, round, and reactive to light. Right eye exhibits no discharge. Left eye exhibits no discharge. No scleral icterus.  Neck: Normal range of motion. Neck supple. No JVD present. No tracheal deviation present. No thyromegaly present.  Cardiovascular: Normal rate, regular rhythm, normal heart sounds and intact distal pulses.  Exam reveals no gallop and no friction rub.   No murmur  heard. Pulmonary/Chest: Effort normal and breath sounds normal. No stridor. No respiratory distress. He has no wheezes. He has no rales. He exhibits no tenderness.  Abdominal: Soft. Bowel sounds are normal. He exhibits no distension and no mass. There is no tenderness. There is no rebound and no guarding.  Musculoskeletal: Normal range of motion. He exhibits no edema or tenderness.  Lymphadenopathy:    He has no cervical adenopathy.  Neurological: He is alert and oriented to person, place, and time. He has normal reflexes. No cranial nerve deficit. He exhibits normal muscle tone. Coordination normal.  Skin: Skin is warm and dry. No rash noted. He is not diaphoretic. No erythema. No pallor.  Psychiatric: He has a normal mood and affect. His behavior is normal. Judgment and thought content normal.  B wax    Procedure Note :     Procedure :  Ear irrigation   Indication:  Cerumen impaction   Risks, including pain, dizziness, eardrum perforation, bleeding, infection and others as well as benefits were explained to the patient in detail. Verbal consent was obtained and the patient agreed to proceed.    We used "The Elephant Ear Irrigation Device" filled with lukewarm water for irrigation. A large amount wax was recovered. Procedure has also required manual wax removal with an ear loop.   Tolerated well. Complications: None.   Postprocedure instructions :  Call if problems.    Lab Results  Component Value Date   WBC 5.6 03/05/2016   HGB 11.2 (L) 03/05/2016   HCT 33.0 (L) 03/05/2016   PLT 214 03/05/2016   GLUCOSE 123 (H) 03/05/2016   CHOL 154 02/20/2013   TRIG 121.0 02/20/2013   HDL 48.60 02/20/2013   LDLCALC 81 02/20/2013   ALT 20 06/18/2015   AST 18 06/18/2015   NA 143 03/05/2016   K 3.1 (L) 03/05/2016   CL 105 03/05/2016   CREATININE 1.10 03/05/2016   BUN 14 03/05/2016   CO2 30 06/18/2015   TSH 0.93 04/03/2013   PSA 5.25 (H) 12/11/2013   INR 1.01 10/12/2014    No  results found.  Assessment & Plan:   There are no diagnoses linked to this encounter. I am having Mr. Bautch maintain his aspirin, tamsulosin, finasteride, fluticasone, lovastatin, furosemide, loratadine, phenazopyridine, sildenafil, amLODipine, triamcinolone, cloNIDine, losartan, and HYDROcodone-acetaminophen.  No orders of the defined types were placed in this encounter.    Follow-up: No Follow-up on file.  [name], MD

## 2016-04-23 ENCOUNTER — Other Ambulatory Visit: Payer: Self-pay | Admitting: Internal Medicine

## 2016-04-23 MED ORDER — POTASSIUM CHLORIDE ER 8 MEQ PO TBCR: 8.0000 meq | EXTENDED_RELEASE_TABLET | Freq: Every day | ORAL | 11 refills | Status: DC

## 2016-04-29 ENCOUNTER — Other Ambulatory Visit: Payer: Self-pay | Admitting: Internal Medicine

## 2016-05-03 DIAGNOSIS — C61 Malignant neoplasm of prostate: Secondary | ICD-10-CM | POA: Diagnosis not present

## 2016-05-10 DIAGNOSIS — N400 Enlarged prostate without lower urinary tract symptoms: Secondary | ICD-10-CM | POA: Diagnosis not present

## 2016-05-10 DIAGNOSIS — C61 Malignant neoplasm of prostate: Secondary | ICD-10-CM | POA: Diagnosis not present

## 2016-05-19 DIAGNOSIS — G4733 Obstructive sleep apnea (adult) (pediatric): Secondary | ICD-10-CM | POA: Diagnosis not present

## 2016-05-23 DIAGNOSIS — N401 Enlarged prostate with lower urinary tract symptoms: Secondary | ICD-10-CM | POA: Diagnosis not present

## 2016-05-23 DIAGNOSIS — R338 Other retention of urine: Secondary | ICD-10-CM | POA: Diagnosis not present

## 2016-05-23 DIAGNOSIS — R339 Retention of urine, unspecified: Secondary | ICD-10-CM | POA: Diagnosis not present

## 2016-05-30 DIAGNOSIS — R338 Other retention of urine: Secondary | ICD-10-CM | POA: Diagnosis not present

## 2016-05-31 DIAGNOSIS — Z96651 Presence of right artificial knee joint: Secondary | ICD-10-CM | POA: Diagnosis not present

## 2016-05-31 DIAGNOSIS — N390 Urinary tract infection, site not specified: Secondary | ICD-10-CM | POA: Diagnosis not present

## 2016-05-31 DIAGNOSIS — Z8546 Personal history of malignant neoplasm of prostate: Secondary | ICD-10-CM | POA: Insufficient documentation

## 2016-05-31 DIAGNOSIS — I10 Essential (primary) hypertension: Secondary | ICD-10-CM | POA: Insufficient documentation

## 2016-05-31 DIAGNOSIS — Z79899 Other long term (current) drug therapy: Secondary | ICD-10-CM | POA: Insufficient documentation

## 2016-05-31 DIAGNOSIS — Z87891 Personal history of nicotine dependence: Secondary | ICD-10-CM | POA: Diagnosis not present

## 2016-05-31 DIAGNOSIS — R310 Gross hematuria: Secondary | ICD-10-CM | POA: Insufficient documentation

## 2016-05-31 DIAGNOSIS — Z7982 Long term (current) use of aspirin: Secondary | ICD-10-CM | POA: Diagnosis not present

## 2016-05-31 DIAGNOSIS — R319 Hematuria, unspecified: Secondary | ICD-10-CM | POA: Diagnosis present

## 2016-05-31 NOTE — ED Triage Notes (Addendum)
Pt c/o blood in urine tonight. First time it was very red, the second time less and the third no blood seen. Hx of prostate cancer.

## 2016-06-01 ENCOUNTER — Emergency Department (HOSPITAL_COMMUNITY)
Admission: EM | Admit: 2016-06-01 | Discharge: 2016-06-01 | Disposition: A | Discharge status: Home / Self Care | Payer: Medicare Other | Attending: Emergency Medicine | Admitting: Emergency Medicine

## 2016-06-01 DIAGNOSIS — R310 Gross hematuria: Secondary | ICD-10-CM

## 2016-06-01 DIAGNOSIS — N390 Urinary tract infection, site not specified: Secondary | ICD-10-CM

## 2016-06-01 LAB — URINALYSIS, ROUTINE W REFLEX MICROSCOPIC
Bilirubin Urine: NEGATIVE
Glucose, UA: NEGATIVE mg/dL
Ketones, ur: NEGATIVE mg/dL
NITRITE: NEGATIVE
PROTEIN: 100 mg/dL — AB
SPECIFIC GRAVITY, URINE: 1.016 (ref 1.005–1.030)
Squamous Epithelial / LPF: NONE SEEN
pH: 5 (ref 5.0–8.0)

## 2016-06-01 MED ORDER — CEPHALEXIN 500 MG PO CAPS
500.0000 mg | ORAL_CAPSULE | Freq: Once | ORAL | Status: AC
  Administered 2016-06-01: 500 mg via ORAL
  Filled 2016-06-01: qty 1

## 2016-06-01 MED ORDER — CEPHALEXIN 500 MG PO CAPS: 500.0000 mg | ORAL_CAPSULE | Freq: Two times a day (BID) | ORAL | 0 refills | Status: AC

## 2016-06-01 NOTE — ED Provider Notes (Signed)
[facility] DEPT Provider Note   By signing my name below, I, Bea Graff, attest that this documentation has been prepared under the direction and in the presence of Sherwood Gambler, MD. Electronically Signed: Bea Graff, ED Scribe. 06/01/16. 12:37 AM.    History   Chief Complaint Chief Complaint  Patient presents with  . Hematuria   HPI  Jeffrey Carter is a 76 y.o. male with PMHx of prostate cancer who presents to the Emergency Department complaining of hematuria that began approximately three hours ago. He reports associated stinging pain at the end of urination. He reports urinating three times since onset and reports the blood was less visible each time. He has not taken anything for pain. There are no modifying factors noted. He denies fever, chills, abdominal pain, nausea, vomiting, flank pain, urinary frequency, back pain, urinary retention, testicular or penile pain. He feels like he's completely emptying his bladder each time. He states he had surgery two years ago for his prostate cancer. He has an appointment with his urologist, Dr. Pilar Jarvis, in five days.   Past Medical History:  Diagnosis Date  . Bladder outlet obstruction   . BPH (benign prostatic hyperplasia)   . Conjunctivitis, acute, bilateral    07-02-2015  per pcp note and started antibiotic drops  . First degree heart block   . History of acute conjunctivitis    07-02-2015  resolved after round of antibiotic eye drops  . History of gout    BIG TOE  . History of urinary retention 12/2014  . Hyperlipidemia   . Hypertension   . Lower urinary tract symptoms (LUTS)   . OSA on CPAP    MODERATE PER STUDY 03-08-2012  . Osteoarthritis   . PAC (premature atrial contraction)   . 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 S/P radiation therapy  08-27-2014  to  10-22-2014   prostate 7800Gy in 40 sessions and seminal vesicals 5000Gy in 40 sessions  . Wears glasses     Patient Active Problem List   Diagnosis Date Noted  . Hypokalemia 04/21/2016  . 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 Dysuria 01/11/2016  . PAC (premature atrial contraction) 06/18/2015  . Acute sinusitis 12/31/2014  . Urination frequency 09/15/2014  . Cerumen impaction 07/14/2014  . Prostate cancer ([place]) 04/14/2014  . Low back pain radiating to right lower extremity 03/13/2014  . Mass of ear canal 01/29/2014  . Otitis, externa, infective 01/22/2014  . Edema 07/05/2013  . Erectile dysfunction 07/05/2013  . Gout of big toe 03/23/2013  . OSA (obstructive sleep apnea) 04/18/2012  . 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  . 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 Dyslipidemia 10/27/2006  . Essential hypertension 09/09/2006    Past Surgical History:  Procedure Laterality Date  . APPENDECTOMY  1966  . COLONOSCOPY  07-06-2006  . GOLD SEED IMPLANT N/A 08/12/2014   Procedure: GOLD SEED IMPLANT;  Surgeon: Lowella Bandy, MD;  Location: [facility];  Service: Urology;  Laterality: N/A;  . GREEN LIGHT LASER TURP (TRANSURETHRAL RESECTION OF PROSTATE N/A 07/20/2015   Procedure: GREEN LIGHT LASER ABLATION OF PROSTATE ;  Surgeon: Nickie Retort, MD;  Location: [facility];  Service: Urology;  Laterality: N/A;  . PROSTATE BIOPSY N/A 03/28/2014   Procedure: BIOPSY TRANSRECTAL ULTRASONIC PROSTATE (TUBP);  Surgeon:  Arvil Persons, MD;  Location: [facility];  Service: Urology;  Laterality: N/A;  . TONSILLECTOMY  as child  . TOTAL KNEE ARTHROPLASTY Right 06-22-2007       Home Medications    Prior to Admission medications   Medication Sig Start Date End Date Taking? Authorizing Provider  amLODipine (NORVASC) 5 MG tablet Take 1 tablet (5 mg total) by mouth daily. 01/11/16   Plotnikov, Evie Lacks, MD  aspirin 81 MG tablet Take  81 mg by mouth daily.      [provider]  cloNIDine (CATAPRES) 0.3 MG tablet TAKE ONE TABLET BY MOUTH TWICE DAILY 03/16/16   Plotnikov, Evie Lacks, MD  finasteride (PROSCAR) 5 MG tablet Take 5 mg by mouth every morning.  12/10/14   [provider]  fluticasone (FLONASE) 50 MCG/ACT nasal spray Place 2 sprays into both nostrils daily. 03/11/15   Chesley Mires, MD  furosemide (LASIX) 40 MG tablet TAKE ONE TABLET BY MOUTH ONCE DAILY AS NEEDED FOR SWELLING 12/09/15   Plotnikov, Evie Lacks, MD  HYDROcodone-acetaminophen (NORCO) 5-325 MG tablet Take 0.5-1 tablets by mouth every 6 (six) hours as needed for moderate pain. 04/14/16   Plotnikov, Evie Lacks, MD  loratadine (CLARITIN) 10 MG tablet TAKE ONE TABLET BY MOUTH ONCE DAILY 12/09/15   Plotnikov, Evie Lacks, MD  losartan (COZAAR) 100 MG tablet TAKE ONE TABLET BY MOUTH ONCE DAILY 03/16/16   Plotnikov, Evie Lacks, MD  lovastatin (MEVACOR) 40 MG tablet TAKE ONE TABLET BY MOUTH ONCE DAILY AT 6 PM 05/02/16   Plotnikov, Evie Lacks, MD  phenazopyridine (PYRIDIUM) 100 MG tablet Take 1 tablet (100 mg total) by mouth 3 (three) times daily as needed for pain. 01/11/16 01/10/17  Plotnikov, Evie Lacks, MD  potassium chloride (KLOR-CON) 8 MEQ tablet Take 1 tablet (8 mEq total) by mouth daily. 04/23/16 04/23/17  Plotnikov, Evie Lacks, MD  sildenafil (VIAGRA) 100 MG tablet Take 1 tablet (100 mg total) by mouth as needed for erectile dysfunction. 01/11/16   Plotnikov, Evie Lacks, MD  tamsulosin (FLOMAX) 0.4 MG CAPS capsule Take 1 capsule (0.4 mg total) by mouth daily. 09/15/14   Arloa Koh, MD  triamcinolone (NASACORT AQ) 55 MCG/ACT AERO nasal inhaler Place 2 sprays into the nose daily. 01/20/16   Biagio Borg, MD    Family History Family History  Problem Relation Age of Onset  . Diabetes Father   . Cancer Brother     pancreatic, stomach    Social History Social History  Substance Use Topics  . Smoking status: Former Smoker    Packs/day: 0.25    Years:  8.00    Types: Cigarettes    Quit date: 01/24/1962  . Smokeless tobacco: Never Used  . Alcohol use No     Allergies   Patient has no known allergies.   Review of Systems Review of Systems  Constitutional: Negative for chills and fever.  Gastrointestinal: Negative for abdominal pain, nausea and vomiting.  Genitourinary: Positive for dysuria and hematuria. Negative for difficulty urinating, flank pain, frequency, penile pain and testicular pain.  Musculoskeletal: Negative for back pain.     Physical Exam Updated Vital Signs BP (!) 175/93 (BP Location: Left Arm)   Pulse 99   Temp 98.3 F (36.8 C) (Oral)   Resp 18   Ht 5\' 9"  (1.753 m)   Wt 235 lb (106.6 kg)   SpO2 100%   BMI 34.70 kg/m   Physical Exam  Constitutional: He is oriented to person, place,  and time. He appears well-developed and well-nourished.  HENT:  Head: Normocephalic and atraumatic.  Right Ear: External ear normal.  Left Ear: External ear normal.  Nose: Nose normal.  Eyes: Right eye exhibits no discharge. Left eye exhibits no discharge.  Neck: Neck supple.  Cardiovascular: Normal rate, regular rhythm and normal heart sounds.   Pulmonary/Chest: Effort normal and breath sounds normal.  Abdominal: Soft. There is no tenderness. There is no CVA tenderness.  Genitourinary: Testes normal and penis normal. Uncircumcised. No penile erythema or penile tenderness. No discharge found.  Musculoskeletal: He exhibits no edema.  Neurological: He is alert and oriented to person, place, and time.  Skin: Skin is warm and dry.  Nursing note and vitals reviewed.    ED Treatments / Results  DIAGNOSTIC STUDIES: Oxygen Saturation is 100% on RA, normal by my interpretation.   COORDINATION OF CARE: 12:35 AM- Will check urinalysis. Pt verbalizes understanding and agrees to plan.  Medications - No data to display  Labs (all labs ordered are listed, but only abnormal results are displayed) Labs Reviewed  URINALYSIS,  ROUTINE W REFLEX MICROSCOPIC - Abnormal; Notable for the following:       Result Value   APPearance CLOUDY (*)    Hgb urine dipstick LARGE (*)    Protein, ur 100 (*)    Leukocytes, UA LARGE (*)    Bacteria, UA RARE (*)    All other components within normal limits  URINE CULTURE    EKG  EKG Interpretation None       Radiology No results found.  Procedures Procedures (including critical care time)  Medications Ordered in ED Medications - No data to display   Initial Impression / Assessment and Plan / ED Course  I have reviewed the triage vital signs and the nursing notes.  Pertinent labs & imaging results that were available during my care of the patient were reviewed by me and considered in my medical decision making (see chart for details).     Urine c/w UTI. No other concerning findings of history/physical. No vomiting/fever/back pain. No signs/symptoms of retention. Counseled on strict return precautions, continue f/u with urology. Start on keflex. Send urine for culture.   Final Clinical Impressions(s) / ED Diagnoses   Final diagnoses:  Gross hematuria  Acute UTI    New Prescriptions New Prescriptions   No medications on file   I personally performed the services described in this documentation, which was scribed in my presence. The recorded information has been reviewed and is accurate.      Sherwood Gambler, MD 06/01/16 1032

## 2016-06-02 LAB — URINE CULTURE

## 2016-06-06 ENCOUNTER — Other Ambulatory Visit: Payer: Self-pay | Admitting: Internal Medicine

## 2016-06-06 DIAGNOSIS — R339 Retention of urine, unspecified: Secondary | ICD-10-CM | POA: Diagnosis not present

## 2016-06-06 DIAGNOSIS — R300 Dysuria: Secondary | ICD-10-CM | POA: Diagnosis not present

## 2016-06-06 NOTE — Telephone Encounter (Signed)
Please advise about medication.  RX loaded if okay.

## 2016-06-06 NOTE — Telephone Encounter (Signed)
The pt was told that he possibly has gout by a NP at the cancer center. He is coming to see Dr Alain Marion on Wednesday. His wife said that he is needing a refill on his pain medication (HYDROcodone-acetaminophen (NORCO) 5-325 MG tablet) and wanted to know if we could go ahead and get started on that because he is needing the refill now. Please advise.

## 2016-06-07 MED ORDER — HYDROCODONE-ACETAMINOPHEN 5-325 MG PO TABS: 0.5000 | ORAL_TABLET | Freq: Four times a day (QID) | ORAL | 0 refills | Status: DC | PRN

## 2016-06-08 ENCOUNTER — Other Ambulatory Visit (INDEPENDENT_AMBULATORY_CARE_PROVIDER_SITE_OTHER): Payer: Medicare Other

## 2016-06-08 ENCOUNTER — Encounter: Payer: Self-pay | Admitting: Internal Medicine

## 2016-06-08 ENCOUNTER — Ambulatory Visit (INDEPENDENT_AMBULATORY_CARE_PROVIDER_SITE_OTHER): Payer: Medicare Other | Admitting: Internal Medicine

## 2016-06-08 ENCOUNTER — Other Ambulatory Visit: Payer: Self-pay | Admitting: Internal Medicine

## 2016-06-08 DIAGNOSIS — R3914 Feeling of incomplete bladder emptying: Secondary | ICD-10-CM | POA: Diagnosis not present

## 2016-06-08 DIAGNOSIS — M1000 Idiopathic gout, unspecified site: Secondary | ICD-10-CM

## 2016-06-08 DIAGNOSIS — I10 Essential (primary) hypertension: Secondary | ICD-10-CM

## 2016-06-08 DIAGNOSIS — M79672 Pain in left foot: Secondary | ICD-10-CM

## 2016-06-08 DIAGNOSIS — M109 Gout, unspecified: Secondary | ICD-10-CM | POA: Insufficient documentation

## 2016-06-08 DIAGNOSIS — R338 Other retention of urine: Secondary | ICD-10-CM | POA: Diagnosis not present

## 2016-06-08 LAB — BASIC METABOLIC PANEL
BUN: 13 mg/dL (ref 6–23)
CHLORIDE: 102 meq/L (ref 96–112)
CO2: 30 mEq/L (ref 19–32)
CREATININE: 1.13 mg/dL (ref 0.40–1.50)
Calcium: 9.3 mg/dL (ref 8.4–10.5)
GFR: 81.56 mL/min (ref >60.00)
Glucose, Bld: 108 mg/dL — ABNORMAL HIGH (ref 70–99)
POTASSIUM: 3.5 meq/L (ref 3.5–5.1)
Sodium: 140 mEq/L (ref 135–145)

## 2016-06-08 LAB — URIC ACID: URIC ACID, SERUM: 6.9 mg/dL (ref 4.0–7.8)

## 2016-06-08 MED ORDER — HYDROCODONE-ACETAMINOPHEN 7.5-325 MG PO TABS: 1.0000 | ORAL_TABLET | Freq: Four times a day (QID) | ORAL | 0 refills | Status: DC | PRN

## 2016-06-08 MED ORDER — INDOMETHACIN 50 MG PO CAPS: 50.0000 mg | ORAL_CAPSULE | Freq: Three times a day (TID) | ORAL | 1 refills | Status: DC | PRN

## 2016-06-08 MED ORDER — METHYLPREDNISOLONE ACETATE 80 MG/ML IJ SUSP
80.0000 mg | Freq: Once | INTRAMUSCULAR | Status: AC
  Administered 2016-06-08: 80 mg via INTRAMUSCULAR

## 2016-06-08 NOTE — Progress Notes (Signed)
Subjective:  Patient ID: Jeffrey Carter, male    DOB: [birth year]  Age: 76 y.o. MRN: [id number]  CC: No chief complaint on file.   HPI Jeffrey Carter presents for severe pain in the L foot since Fri  Outpatient Medications Prior to Visit  Medication Sig Dispense Refill  . amLODipine (NORVASC) 5 MG tablet Take 1 tablet (5 mg total) by mouth daily. 90 tablet 3  . aspirin 81 MG tablet Take 81 mg by mouth daily.      . cloNIDine (CATAPRES) 0.3 MG tablet TAKE ONE TABLET BY MOUTH TWICE DAILY 180 tablet 2  . finasteride (PROSCAR) 5 MG tablet Take 5 mg by mouth every morning.     . fluticasone (FLONASE) 50 MCG/ACT nasal spray Place 2 sprays into both nostrils daily. 16 g 2  . furosemide (LASIX) 40 MG tablet TAKE ONE TABLET BY MOUTH ONCE DAILY AS NEEDED FOR SWELLING 90 tablet 1  . HYDROcodone-acetaminophen (NORCO) 5-325 MG tablet Take 0.5-1 tablets by mouth every 6 (six) hours as needed for moderate pain. 60 tablet 0  . loratadine (CLARITIN) 10 MG tablet TAKE ONE TABLET BY MOUTH ONCE DAILY 100 tablet 3  . losartan (COZAAR) 100 MG tablet TAKE ONE TABLET BY MOUTH ONCE DAILY 90 tablet 2  . lovastatin (MEVACOR) 40 MG tablet TAKE ONE TABLET BY MOUTH ONCE DAILY AT 6 PM 90 tablet 1  . phenazopyridine (PYRIDIUM) 100 MG tablet Take 1 tablet (100 mg total) by mouth 3 (three) times daily as needed for pain. 30 tablet 1  . potassium chloride (KLOR-CON) 8 MEQ tablet Take 1 tablet (8 mEq total) by mouth daily. 30 tablet 11  . sildenafil (VIAGRA) 100 MG tablet Take 1 tablet (100 mg total) by mouth as needed for erectile dysfunction. 12 tablet 5  . tamsulosin (FLOMAX) 0.4 MG CAPS capsule Take 1 capsule (0.4 mg total) by mouth daily. 30 capsule 3  . triamcinolone (NASACORT AQ) 55 MCG/ACT AERO nasal inhaler Place 2 sprays into the nose daily. 1 Inhaler 12   No facility-administered medications prior to visit.     ROS Review of Systems  Constitutional: Negative for appetite change, fatigue and unexpected  weight change.  HENT: Negative for congestion, nosebleeds, sneezing, sore throat and trouble swallowing.   Eyes: Negative for itching and visual disturbance.  Respiratory: Negative for cough.   Cardiovascular: Negative for chest pain, palpitations and leg swelling.  Gastrointestinal: Negative for abdominal distention, blood in stool, diarrhea and nausea.  Genitourinary: Negative for frequency and hematuria.  Musculoskeletal: Positive for arthralgias. Negative for back pain, gait problem, joint swelling and neck pain.  Skin: Negative for rash.  Neurological: Negative for dizziness, tremors, speech difficulty and weakness.  Psychiatric/Behavioral: Negative for agitation, dysphoric mood and sleep disturbance. The patient is not nervous/anxious.     Objective:  BP 140/90 (BP Location: Right Arm, Patient Position: Sitting, Cuff Size: Normal)   Pulse 83   Temp 97.8 F (36.6 C) (Oral)   Ht 5\' 9"  (1.753 m)   Wt 230 lb (104.3 kg)   SpO2 95%   BMI 33.97 kg/m   BP Readings from Last 3 Encounters:  06/08/16 140/90  06/01/16 (!) 187/105  04/21/16 (!) 142/96    Wt Readings from Last 3 Encounters:  06/08/16 230 lb (104.3 kg)  05/31/16 235 lb (106.6 kg)  04/21/16 235 lb (106.6 kg)    Physical Exam  Constitutional: He is oriented to person, place, and time. He appears well-developed. No distress.  NAD  HENT:  Mouth/Throat: Oropharynx is clear and moist.  Eyes: Conjunctivae are normal. Pupils are equal, round, and reactive to light.  Neck: Normal range of motion. No JVD present. No thyromegaly present.  Cardiovascular: Normal rate, regular rhythm, normal heart sounds and intact distal pulses.  Exam reveals no gallop and no friction rub.   No murmur heard. Pulmonary/Chest: Effort normal and breath sounds normal. No respiratory distress. He has no wheezes. He has no rales. He exhibits no tenderness.  Abdominal: Soft. Bowel sounds are normal. He exhibits no distension and no mass. There is  no tenderness. There is no rebound and no guarding.  Musculoskeletal: Normal range of motion. He exhibits tenderness. He exhibits no edema.  Lymphadenopathy:    He has no cervical adenopathy.  Neurological: He is alert and oriented to person, place, and time. He has normal reflexes. No cranial nerve deficit. He exhibits normal muscle tone. He displays a negative Romberg sign. Coordination and gait normal.  Skin: Skin is warm and dry. No rash noted. There is erythema.  Psychiatric: He has a normal mood and affect. His behavior is normal. Judgment and thought content normal.  L 1st MTP tender and swollen  Lab Results  Component Value Date   WBC 5.6 03/05/2016   HGB 11.2 (L) 03/05/2016   HCT 33.0 (L) 03/05/2016   PLT 214 03/05/2016   GLUCOSE 117 (H) 04/21/2016   CHOL 154 02/20/2013   TRIG 121.0 02/20/2013   HDL 48.60 02/20/2013   LDLCALC 81 02/20/2013   ALT 20 06/18/2015   AST 18 06/18/2015   NA 139 04/21/2016   K 3.4 (L) 04/21/2016   CL 101 04/21/2016   CREATININE 1.22 04/21/2016   BUN 12 04/21/2016   CO2 31 04/21/2016   TSH 0.93 04/03/2013   PSA 5.25 (H) 12/11/2013   INR 1.01 10/12/2014    No results found.  Assessment & Plan:   There are no diagnoses linked to this encounter. I am having Mr. Meuth maintain his aspirin, tamsulosin, finasteride, fluticasone, furosemide, loratadine, phenazopyridine, sildenafil, amLODipine, triamcinolone, cloNIDine, losartan, potassium chloride, lovastatin, and HYDROcodone-acetaminophen.  No orders of the defined types were placed in this encounter.    Follow-up: No Follow-up on file.  Jeffrey Kehr, MD

## 2016-06-08 NOTE — Assessment & Plan Note (Signed)
Medications: Amlodipine, Catapress, Furosemide, Losartan

## 2016-06-08 NOTE — Telephone Encounter (Signed)
Notified pt rx ready for pick-up.../lmb 

## 2016-06-08 NOTE — Patient Instructions (Signed)

## 2016-06-08 NOTE — Addendum Note (Signed)
Addended by: Aviva Signs M on: 06/08/2016 05:04 PM   Modules accepted: Orders

## 2016-06-08 NOTE — Assessment & Plan Note (Signed)
Depo-medrol 80 mg IM Norco Labs

## 2016-06-08 NOTE — Assessment & Plan Note (Signed)
Depo-medrol Norco Labs

## 2016-06-14 ENCOUNTER — Ambulatory Visit: Payer: Medicare Other

## 2016-06-14 ENCOUNTER — Encounter: Payer: Self-pay | Admitting: Internal Medicine

## 2016-06-14 VITALS — Ht 69.0 in | Wt 226.6 lb

## 2016-06-14 DIAGNOSIS — Z1211 Encounter for screening for malignant neoplasm of colon: Secondary | ICD-10-CM

## 2016-06-14 NOTE — Progress Notes (Signed)
No allergies to eggs or soy No home oxygen No diet meds No past problems with anesthesia  Declined emmi no internet

## 2016-06-17 DIAGNOSIS — R338 Other retention of urine: Secondary | ICD-10-CM | POA: Diagnosis not present

## 2016-06-24 DIAGNOSIS — R338 Other retention of urine: Secondary | ICD-10-CM | POA: Diagnosis not present

## 2016-06-24 DIAGNOSIS — G4733 Obstructive sleep apnea (adult) (pediatric): Secondary | ICD-10-CM | POA: Diagnosis not present

## 2016-06-24 DIAGNOSIS — R339 Retention of urine, unspecified: Secondary | ICD-10-CM | POA: Diagnosis not present

## 2016-06-28 ENCOUNTER — Ambulatory Visit (AMBULATORY_SURGERY_CENTER): Payer: Medicare Other | Admitting: Internal Medicine

## 2016-06-28 ENCOUNTER — Encounter: Payer: Self-pay | Admitting: Internal Medicine

## 2016-06-28 VITALS — BP 137/70 | HR 56 | Temp 98.0°F | Resp 18 | Ht 69.0 in | Wt 226.0 lb

## 2016-06-28 DIAGNOSIS — D123 Benign neoplasm of transverse colon: Secondary | ICD-10-CM | POA: Diagnosis not present

## 2016-06-28 DIAGNOSIS — D125 Benign neoplasm of sigmoid colon: Secondary | ICD-10-CM

## 2016-06-28 DIAGNOSIS — Z1212 Encounter for screening for malignant neoplasm of rectum: Secondary | ICD-10-CM | POA: Diagnosis not present

## 2016-06-28 DIAGNOSIS — Z1211 Encounter for screening for malignant neoplasm of colon: Secondary | ICD-10-CM

## 2016-06-28 DIAGNOSIS — K635 Polyp of colon: Secondary | ICD-10-CM

## 2016-06-28 MED ORDER — SODIUM CHLORIDE 0.9 % IV SOLN: 500.0000 mL | INTRAVENOUS | Status: DC

## 2016-06-28 NOTE — Progress Notes (Signed)
Called to room to assist during endoscopic procedure.  Patient ID and intended procedure confirmed with present staff. Received instructions for my participation in the procedure from the performing physician.  

## 2016-06-28 NOTE — Progress Notes (Signed)
A and O x3 vss. Report to Judson Roch. Pt pleased with mac.

## 2016-06-28 NOTE — Op Note (Signed)
[name] Patient Name: Jeffrey Carter Procedure Date: 06/28/2016 11:06 AM MRN: [id number] Endoscopist: Gatha Mayer , MD Age: 76 Referring MD:  Date of Birth: Oct 30, 1940 Gender: Male Account #: [id number] Procedure:                Colonoscopy Indications:              Screening for colorectal malignant neoplasm Medicines:                Propofol per Anesthesia, Monitored Anesthesia Care Procedure:                Pre-Anesthesia Assessment:                           - Prior to the procedure, a History and Physical                            was performed, and patient medications and                            allergies were reviewed. The patient's tolerance of                            previous anesthesia was also reviewed. The risks                            and benefits of the procedure and the sedation                            options and risks were discussed with the patient.                            All questions were answered, and informed consent                            was obtained. Prior Anticoagulants: The patient has                            taken no previous anticoagulant or antiplatelet                            agents. ASA Grade Assessment: III - A patient with                            severe systemic disease. After reviewing the risks                            and benefits, the patient was deemed in                            satisfactory condition to undergo the procedure.                           After obtaining informed consent, the colonoscope  was passed under direct vision. Throughout the                            procedure, the patient's blood pressure, pulse, and                            oxygen saturations were monitored continuously. The                            Colonoscope was introduced through the anus and                            advanced to the the cecum, identified by   appendiceal orifice and ileocecal valve. The                            colonoscopy was performed without difficulty. The                            patient tolerated the procedure well. The quality                            of the bowel preparation was good. The ileocecal                            valve, appendiceal orifice, and rectum were                            photographed. Scope In: 11:30:06 AM Scope Out: 11:48:13 AM Scope Withdrawal Time: 0 hours 13 minutes 13 seconds  Total Procedure Duration: 0 hours 18 minutes 7 seconds  Findings:                 The perianal and digital rectal examinations were                            normal. Pertinent negatives include normal prostate                            (size, shape, and consistency).                           Two sessile polyps were found in the sigmoid colon                            and transverse colon. The polyps were diminutive in                            size. These polyps were removed with a cold snare.                            Resection and retrieval were complete. Verification                            of patient identification for the  specimen was                            done. Estimated blood loss was minimal.                           Many small and large-mouthed diverticula were found                            in the sigmoid colon.                           The exam was otherwise without abnormality on                            direct and retroflexion views. Complications:            No immediate complications. Estimated Blood Loss:     Estimated blood loss was minimal. Impression:               - Two diminutive polyps in the sigmoid colon and in                            the transverse colon, removed with a cold snare.                            Resected and retrieved.                           - Diverticulosis in the sigmoid colon.                           - The examination was otherwise normal on  direct                            and retroflexion views. Recommendation:           - Patient has a contact number available for                            emergencies. The signs and symptoms of potential                            delayed complications were discussed with the                            patient. Return to normal activities tomorrow.                            Written discharge instructions were provided to the                            patient.                           - Resume previous diet.                           -  Continue present medications.                           - Repeat colonoscopy is recommended May not need at                            his age. The colonoscopy date will be determined                            after pathology results from today's exam become                            available for review. Gatha Mayer, MD 06/28/2016 11:58:45 AM This report has been signed electronically.

## 2016-06-28 NOTE — Progress Notes (Signed)
Pt's states no medical or surgical changes since previsit or office visit. 

## 2016-06-28 NOTE — Patient Instructions (Addendum)
I found and removed 2 tiny polyps - not to worry look benign.  You also  have diverticulosis - thickened muscle rings and pouches in the colon wall. Please read the handout about this condition.  I will let you know pathology results and if or when to have another routine colonoscopy by mail and/or My Chart.  I appreciate the opportunity to care for you. Gatha Mayer, MD, FACG   YOU HAD AN ENDOSCOPIC PROCEDURE TODAY AT [facility]:   Refer to the procedure report that was given to you for any specific questions about what was found during the examination.  If the procedure report does not answer your questions, please call your gastroenterologist to clarify.  If you requested that your care partner not be given the details of your procedure findings, then the procedure report has been included in a sealed envelope for you to review at your convenience later.  YOU SHOULD EXPECT: Some feelings of bloating in the abdomen. Passage of more gas than usual.  Walking can help get rid of the air that was put into your GI tract during the procedure and reduce the bloating. If you had a lower endoscopy (such as a colonoscopy or flexible sigmoidoscopy) you may notice spotting of blood in your stool or on the toilet paper. If you underwent a bowel prep for your procedure, you may not have a normal bowel movement for a few days.  Please Note:  You might notice some irritation and congestion in your nose or some drainage.  This is from the oxygen used during your procedure.  There is no need for concern and it should clear up in a day or so.  SYMPTOMS TO REPORT IMMEDIATELY:   Following lower endoscopy (colonoscopy or flexible sigmoidoscopy):  Excessive amounts of blood in the stool  Significant tenderness or worsening of abdominal pains  Swelling of the abdomen that is new, acute  Fever of 100F or higher  For urgent or emergent issues, a gastroenterologist can be reached at  any hour by calling [phone].   DIET:  We do recommend a small meal at first, but then you may proceed to your regular diet.  Drink plenty of fluids but you should avoid alcoholic beverages for 24 hours.  MEDICATIONS:  Continue present medications.  Please see handouts given to you by your recovery nurse.  ACTIVITY:  You should plan to take it easy for the rest of today and you should NOT DRIVE or use heavy machinery until tomorrow (because of the sedation medicines used during the test).    FOLLOW UP: Our staff will call the number listed on your records the next business day following your procedure to check on you and address any questions or concerns that you may have regarding the information given to you following your procedure. If we do not reach you, we will leave a message.  However, if you are feeling well and you are not experiencing any problems, there is no need to return our call.  We will assume that you have returned to your regular daily activities without incident.  If any biopsies were taken you will be contacted by phone or by letter within the next 1-3 weeks.  Please call us at [phone] if you have not heard about the biopsies in 3 weeks.   Thank you for allowing [place] to provide for your healthcare needs today.  SIGNATURES/CONFIDENTIALITY: You and/or your care partner have signed paperwork which  will be entered into your electronic medical record.  These signatures attest to the fact that that the information above on your After Visit Summary has been reviewed and is understood.  Full responsibility of the confidentiality of this discharge information lies with you and/or your care-partner.

## 2016-06-29 ENCOUNTER — Telehealth: Payer: Self-pay | Admitting: *Deleted

## 2016-06-29 NOTE — Telephone Encounter (Signed)
  Follow up Call-   Not able to leave message; mailbox full

## 2016-06-29 NOTE — Telephone Encounter (Signed)
  Follow up Call-  Call back number 06/28/2016  Post procedure Call Back phone  # [phone]  Permission to leave phone message Yes  Some recent data might be hidden     Patient questions:  Do you have a fever, pain , or abdominal swelling? No. Pain Score  0 *  Have you tolerated food without any problems? Yes.    Have you been able to return to your normal activities? Yes.    Do you have any questions about your discharge instructions: Diet   No. Medications  No. Follow up visit  No.  Do you have questions or concerns about your Care? No.  Actions: * If pain score is 4 or above: No action needed, pain <4.

## 2016-07-07 ENCOUNTER — Encounter: Payer: Self-pay | Admitting: Internal Medicine

## 2016-07-07 DIAGNOSIS — Z860101 Personal history of adenomatous and serrated colon polyps: Secondary | ICD-10-CM

## 2016-07-07 DIAGNOSIS — Z86010 Personal history of colonic polyps: Secondary | ICD-10-CM | POA: Insufficient documentation

## 2016-07-07 HISTORY — DX: Personal history of adenomatous and serrated colon polyps: Z86.0101

## 2016-07-07 HISTORY — DX: Personal history of colonic polyps: Z86.010

## 2016-07-07 NOTE — Progress Notes (Signed)
2 diminutive adenomas Consider repeat colonoscopy in 5 years - may not need at that age

## 2016-07-08 DIAGNOSIS — C61 Malignant neoplasm of prostate: Secondary | ICD-10-CM | POA: Diagnosis not present

## 2016-07-08 DIAGNOSIS — R310 Gross hematuria: Secondary | ICD-10-CM | POA: Diagnosis not present

## 2016-07-26 ENCOUNTER — Ambulatory Visit: Payer: Medicare Other | Admitting: Internal Medicine

## 2016-07-31 ENCOUNTER — Emergency Department (HOSPITAL_COMMUNITY)
Admission: EM | Admit: 2016-07-31 | Discharge: 2016-07-31 | Disposition: A | Discharge status: Home / Self Care | Payer: Medicare Other | Attending: Emergency Medicine | Admitting: Emergency Medicine

## 2016-07-31 ENCOUNTER — Encounter (HOSPITAL_COMMUNITY): Payer: Self-pay

## 2016-07-31 DIAGNOSIS — Z87891 Personal history of nicotine dependence: Secondary | ICD-10-CM | POA: Diagnosis not present

## 2016-07-31 DIAGNOSIS — Z8546 Personal history of malignant neoplasm of prostate: Secondary | ICD-10-CM | POA: Diagnosis not present

## 2016-07-31 DIAGNOSIS — M109 Gout, unspecified: Secondary | ICD-10-CM | POA: Insufficient documentation

## 2016-07-31 DIAGNOSIS — R339 Retention of urine, unspecified: Secondary | ICD-10-CM | POA: Diagnosis not present

## 2016-07-31 DIAGNOSIS — I10 Essential (primary) hypertension: Secondary | ICD-10-CM | POA: Diagnosis not present

## 2016-07-31 DIAGNOSIS — Z79899 Other long term (current) drug therapy: Secondary | ICD-10-CM | POA: Insufficient documentation

## 2016-07-31 DIAGNOSIS — Z7982 Long term (current) use of aspirin: Secondary | ICD-10-CM | POA: Insufficient documentation

## 2016-07-31 DIAGNOSIS — Z96651 Presence of right artificial knee joint: Secondary | ICD-10-CM | POA: Diagnosis not present

## 2016-07-31 LAB — URINALYSIS, ROUTINE W REFLEX MICROSCOPIC
Bilirubin Urine: NEGATIVE
Glucose, UA: NEGATIVE mg/dL
Ketones, ur: NEGATIVE mg/dL
Nitrite: NEGATIVE
Protein, ur: NEGATIVE mg/dL
Specific Gravity, Urine: 1.005 (ref 1.005–1.030)
Squamous Epithelial / LPF: NONE SEEN
pH: 7 (ref 5.0–8.0)

## 2016-07-31 MED ORDER — CEPHALEXIN 500 MG PO CAPS: 500.0000 mg | ORAL_CAPSULE | Freq: Three times a day (TID) | ORAL | 0 refills | Status: DC

## 2016-07-31 MED ORDER — OXYCODONE-ACETAMINOPHEN 5-325 MG PO TABS: 1.0000 | ORAL_TABLET | ORAL | 0 refills | Status: DC | PRN

## 2016-07-31 MED ORDER — COLCHICINE 0.6 MG PO TABS
1.2000 mg | ORAL_TABLET | Freq: Once | ORAL | Status: AC
  Administered 2016-07-31: 1.2 mg via ORAL
  Filled 2016-07-31: qty 2

## 2016-07-31 MED ORDER — OXYCODONE-ACETAMINOPHEN 5-325 MG PO TABS
1.0000 | ORAL_TABLET | Freq: Once | ORAL | Status: AC
  Administered 2016-07-31: 1 via ORAL
  Filled 2016-07-31: qty 1

## 2016-07-31 MED ORDER — CEPHALEXIN 500 MG PO CAPS
500.0000 mg | ORAL_CAPSULE | Freq: Once | ORAL | Status: AC
  Administered 2016-07-31: 500 mg via ORAL
  Filled 2016-07-31: qty 1

## 2016-07-31 MED ORDER — IBUPROFEN 600 MG PO TABS: 600.0000 mg | ORAL_TABLET | Freq: Four times a day (QID) | ORAL | 0 refills | Status: DC | PRN

## 2016-07-31 MED ORDER — IBUPROFEN 200 MG PO TABS
400.0000 mg | ORAL_TABLET | Freq: Once | ORAL | Status: AC
  Administered 2016-07-31: 400 mg via ORAL
  Filled 2016-07-31: qty 2

## 2016-07-31 NOTE — ED Provider Notes (Signed)
[facility] DEPT Provider Note   CSN: [id number] Arrival date & time: 07/31/16  1219  By signing my name below, I, Sonum Patel, attest that this documentation has been prepared under the direction and in the presence of Virgel Manifold, MD. Electronically Signed: Sonum Patel, Education administrator. 07/31/16. 1:54 PM.  History   Chief Complaint Chief Complaint  Patient presents with  . Urinary Retention    The history is provided by the patient and the spouse. No language interpreter was used.     HPI Comments: Jeffrey Carter is a 76 y.o. male who presents to the Emergency Department complaining of urinary retention that began today. Patient states he was able to void this morning but began to have difficulty as today progressed to the point of retention. He reports having associated small amount of hematuria and small clots. He has a history of similar symptoms and has had to use a foley catheter in the past. He has a history of bladder output obstruction, BPH, and prostate CA. He had a catheter placed in the ED and now reports feeling better with resolution of his symptoms.   He also notes having left foot swelling and pain that worsened 3-4 days ago. He has a history of gout and notes this feels similar. He has used OTC pain medication which he has used with minimal relief. He takes preventative gout medication which he has run out of and has not been able to get refilled.   Past Medical History:  Diagnosis Date  . Bladder outlet obstruction   . BPH (benign prostatic hyperplasia)   . Conjunctivitis, acute, bilateral    07-02-2015  per pcp note and started antibiotic drops  . First degree heart block   . History of acute conjunctivitis    07-02-2015  resolved after round of antibiotic eye drops  . History of gout    BIG TOE  . History of urinary retention 12/2014  . Hx of adenomatous colonic polyps 07/07/2016  . Hyperlipidemia   . Hypertension   . Lower urinary tract symptoms (LUTS)   . OSA  on CPAP    MODERATE PER STUDY 03-08-2012  . Osteoarthritis   . PAC (premature atrial contraction)   . 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 S/P radiation therapy 08-27-2014  to  10-22-2014   prostate 7800Gy in 40 sessions and seminal vesicals 5000Gy in 40 sessions  . Wears glasses     Patient Active Problem List   Diagnosis Date Noted  . Hx of adenomatous colonic polyps 07/07/2016  . Gout attack 06/08/2016  . Foot pain, left 06/08/2016  . Hypokalemia 04/21/2016  . 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 Dysuria 01/11/2016  . PAC (premature atrial contraction) 06/18/2015  . Acute sinusitis 12/31/2014  . Urination frequency 09/15/2014  . Cerumen impaction 07/14/2014  . Prostate cancer ([place]) 04/14/2014  . Low back pain radiating to right lower extremity 03/13/2014  . Mass of ear canal 01/29/2014  . Otitis, externa, infective 01/22/2014  . Edema 07/05/2013  . Erectile dysfunction 07/05/2013  . Gout of big toe 03/23/2013  . OSA (obstructive sleep apnea) 04/18/2012  . 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  . 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 Dyslipidemia 10/27/2006  . Essential hypertension 09/09/2006    Past Surgical History:  Procedure Laterality Date  .  APPENDECTOMY  1966  . COLONOSCOPY  07-06-2006  . GOLD SEED IMPLANT N/A 08/12/2014   Procedure: GOLD SEED IMPLANT;  Surgeon: Lowella Bandy, MD;  Location: [facility];  Service: Urology;  Laterality: N/A;  . GREEN LIGHT LASER TURP (TRANSURETHRAL RESECTION OF PROSTATE N/A 07/20/2015   Procedure: GREEN LIGHT LASER ABLATION OF PROSTATE ;  Surgeon: Nickie Retort, MD;  Location: [facility];  Service: Urology;  Laterality: N/A;  . PROSTATE BIOPSY N/A 03/28/2014   Procedure: BIOPSY TRANSRECTAL ULTRASONIC PROSTATE (TUBP);  Surgeon:  Arvil Persons, MD;  Location: [facility];  Service: Urology;  Laterality: N/A;  . TONSILLECTOMY  as child  . TOTAL KNEE ARTHROPLASTY Right 06-22-2007       Home Medications    Prior to Admission medications   Medication Sig Start Date End Date Taking? Authorizing Provider  amLODipine (NORVASC) 5 MG tablet Take 1 tablet (5 mg total) by mouth daily. 01/11/16   Plotnikov, Evie Lacks, MD  aspirin 81 MG tablet Take 81 mg by mouth daily.      [provider]  cloNIDine (CATAPRES) 0.3 MG tablet TAKE ONE TABLET BY MOUTH TWICE DAILY 03/16/16   Plotnikov, Evie Lacks, MD  fluticasone (FLONASE) 50 MCG/ACT nasal spray Place 2 sprays into both nostrils daily. 03/11/15   Chesley Mires, MD  furosemide (LASIX) 40 MG tablet TAKE ONE TABLET BY MOUTH ONCE DAILY AS NEEDED FOR  SWELLING 06/08/16   Plotnikov, Evie Lacks, MD  HYDROcodone-acetaminophen (NORCO) 7.5-325 MG tablet Take 1 tablet by mouth 4 (four) times daily as needed for moderate pain. For gout attack 06/08/16   Plotnikov, Evie Lacks, MD  indomethacin (INDOCIN) 50 MG capsule Take 1 capsule (50 mg total) by mouth 3 (three) times daily as needed. For gout attack 06/08/16   Plotnikov, Evie Lacks, MD  loratadine (CLARITIN) 10 MG tablet TAKE ONE TABLET BY MOUTH ONCE DAILY 12/09/15   Plotnikov, Evie Lacks, MD  losartan (COZAAR) 100 MG tablet TAKE ONE TABLET BY MOUTH ONCE DAILY 03/16/16   Plotnikov, Evie Lacks, MD  lovastatin (MEVACOR) 40 MG tablet TAKE ONE TABLET BY MOUTH ONCE DAILY AT 6 PM 05/02/16   Plotnikov, Evie Lacks, MD  nitrofurantoin (MACRODANTIN) 100 MG capsule Take 100 mg by mouth 2 (two) times daily.    [provider]  potassium chloride (KLOR-CON) 8 MEQ tablet Take 1 tablet (8 mEq total) by mouth daily. 04/23/16 04/23/17  Plotnikov, Evie Lacks, MD  sildenafil (VIAGRA) 100 MG tablet Take 1 tablet (100 mg total) by mouth as needed for erectile dysfunction. 01/11/16   Plotnikov, Evie Lacks, MD  tamsulosin (FLOMAX) 0.4 MG CAPS capsule  Take 1 capsule (0.4 mg total) by mouth daily. 09/15/14   Arloa Koh, MD    Family History Family History  Problem Relation Age of Onset  . Diabetes Father   . Cancer Brother        pancreatic, stomach  . Colon cancer Neg Hx     Social History Social History  Substance Use Topics  . Smoking status: Former Smoker    Packs/day: 0.25    Years: 8.00    Types: Cigarettes    Quit date: 01/24/1962  . Smokeless tobacco: Never Used  . Alcohol use No     Allergies   Patient has no known allergies.   Review of Systems Review of Systems  All other systems reviewed and are negative for acute change except as noted in the HPI.   Physical Exam  Updated Vital Signs BP (!) 176/122 (BP Location: Left Arm)   Pulse 65   Temp 97.7 F (36.5 C) (Oral)   Resp 20   Ht 5\' 9"  (1.753 m)   Wt 230 lb (104.3 kg)   SpO2 99%   BMI 33.97 kg/m   Physical Exam  Constitutional: He is oriented to person, place, and time. He appears well-developed and well-nourished.  HENT:  Head: Normocephalic and atraumatic.  Eyes: EOM are normal.  Neck: Normal range of motion.  Cardiovascular: Normal rate, regular rhythm, normal heart sounds and intact distal pulses.   Pulmonary/Chest: Effort normal and breath sounds normal. No respiratory distress.  Abdominal: Soft. He exhibits no distension. There is no tenderness.  Patient evaluated after catheter placed. Abdomen is soft and bladder is non-palpable.   Genitourinary:  Genitourinary Comments: Clear appearing urine in leg bag.   Musculoskeletal: Normal range of motion. He exhibits edema.  Left foot with mild swelling. Patient points to 1st MT joint as location of pain. Increased pain with passive ROM. No increased warmth. No redness.   Neurological: He is alert and oriented to person, place, and time.  Skin: Skin is warm and dry. No erythema.  Psychiatric: He has a normal mood and affect. Judgment normal.  Nursing note and vitals reviewed.    ED  Treatments / Results  DIAGNOSTIC STUDIES: Oxygen Saturation is 99% on RA, normal by my interpretation.    COORDINATION OF CARE: 1:50 PM Discussed treatment plan with pt at bedside and pt agreed to plan.   Labs (all labs ordered are listed, but only abnormal results are displayed) Labs Reviewed  URINE CULTURE - Abnormal; Notable for the following:       Result Value   Culture MULTIPLE SPECIES PRESENT, SUGGEST RECOLLECTION (*)    All other components within normal limits  URINALYSIS, ROUTINE W REFLEX MICROSCOPIC - Abnormal; Notable for the following:    Color, Urine STRAW (*)    APPearance HAZY (*)    Hgb urine dipstick LARGE (*)    Leukocytes, UA LARGE (*)    Bacteria, UA RARE (*)    All other components within normal limits    EKG  EKG Interpretation None       Radiology No results found.  Procedures Procedures (including critical care time)  Medications Ordered in ED Medications - No data to display   Initial Impression / Assessment and Plan / ED Course  I have reviewed the triage vital signs and the nursing notes.  Pertinent labs & imaging results that were available during my care of the patient were reviewed by me and considered in my medical decision making (see chart for details).     76 year old male with urinary retention. Foley placed. Symptoms resolved. He is additionally complaining of pain in his foot which I suspect scalp. Symptomatic Treatment of this. Outpatient urology follow-up and PCP with regards to gout.  Final Clinical Impressions(s) / ED Diagnoses   Final diagnoses:  Urinary retention  Gout involving toe of left foot, unspecified cause, unspecified chronicity    New Prescriptions New Prescriptions   No medications on file    I personally preformed the services scribed in my presence. The recorded information has been reviewed is accurate. Virgel Manifold, MD.    Virgel Manifold, MD 08/03/16 2144

## 2016-07-31 NOTE — ED Triage Notes (Signed)
Pt with urinary retention starting today.  Pt c/o bladder pain.

## 2016-08-01 LAB — URINE CULTURE

## 2016-08-02 ENCOUNTER — Encounter: Payer: Self-pay | Admitting: Internal Medicine

## 2016-08-02 ENCOUNTER — Ambulatory Visit (INDEPENDENT_AMBULATORY_CARE_PROVIDER_SITE_OTHER): Payer: Medicare Other | Admitting: Internal Medicine

## 2016-08-02 DIAGNOSIS — C61 Malignant neoplasm of prostate: Secondary | ICD-10-CM | POA: Diagnosis not present

## 2016-08-02 DIAGNOSIS — M1000 Idiopathic gout, unspecified site: Secondary | ICD-10-CM

## 2016-08-02 DIAGNOSIS — I10 Essential (primary) hypertension: Secondary | ICD-10-CM | POA: Diagnosis not present

## 2016-08-02 DIAGNOSIS — N320 Bladder-neck obstruction: Secondary | ICD-10-CM | POA: Diagnosis not present

## 2016-08-02 MED ORDER — HYDROCODONE-ACETAMINOPHEN 7.5-325 MG PO TABS: 1.0000 | ORAL_TABLET | Freq: Four times a day (QID) | ORAL | 0 refills | Status: DC | PRN

## 2016-08-02 NOTE — Assessment & Plan Note (Signed)
Worse Foley F/u w/Urology

## 2016-08-02 NOTE — Assessment & Plan Note (Signed)
Amlodipine, Catapress, Furosemide, Losartan °

## 2016-08-02 NOTE — Patient Instructions (Signed)
[facility] w/[facility] 

## 2016-08-02 NOTE — Assessment & Plan Note (Signed)
S/p XRT 

## 2016-08-02 NOTE — Progress Notes (Signed)
Subjective:  Patient ID: Jeffrey Carter, male    DOB: 1940/12/09  Age: 76 y.o. MRN: [id number]  CC: No chief complaint on file.   HPI Jeffrey Carter presents for a urinary retention post-XRT, prostate ca, HTN, LBP f/u  Outpatient Medications Prior to Visit  Medication Sig Dispense Refill  . amLODipine (NORVASC) 5 MG tablet Take 1 tablet (5 mg total) by mouth daily. 90 tablet 3  . aspirin 81 MG tablet Take 81 mg by mouth daily.      . cephALEXin (KEFLEX) 500 MG capsule Take 1 capsule (500 mg total) by mouth 3 (three) times daily. 15 capsule 0  . cloNIDine (CATAPRES) 0.3 MG tablet TAKE ONE TABLET BY MOUTH TWICE DAILY 180 tablet 2  . fluticasone (FLONASE) 50 MCG/ACT nasal spray Place 2 sprays into both nostrils daily. 16 g 2  . furosemide (LASIX) 40 MG tablet TAKE ONE TABLET BY MOUTH ONCE DAILY AS NEEDED FOR  SWELLING 90 tablet 1  . HYDROcodone-acetaminophen (NORCO) 7.5-325 MG tablet Take 1 tablet by mouth 4 (four) times daily as needed for moderate pain. For gout attack 20 tablet 0  . ibuprofen (ADVIL,MOTRIN) 600 MG tablet Take 1 tablet (600 mg total) by mouth every 6 (six) hours as needed. 20 tablet 0  . indomethacin (INDOCIN) 50 MG capsule Take 1 capsule (50 mg total) by mouth 3 (three) times daily as needed. For gout attack 60 capsule 1  . loratadine (CLARITIN) 10 MG tablet TAKE ONE TABLET BY MOUTH ONCE DAILY 100 tablet 3  . losartan (COZAAR) 100 MG tablet TAKE ONE TABLET BY MOUTH ONCE DAILY 90 tablet 2  . lovastatin (MEVACOR) 40 MG tablet TAKE ONE TABLET BY MOUTH ONCE DAILY AT 6 PM 90 tablet 1  . nitrofurantoin (MACRODANTIN) 100 MG capsule Take 100 mg by mouth 2 (two) times daily.    Marland Kitchen oxyCODONE-acetaminophen (PERCOCET/ROXICET) 5-325 MG tablet Take 1 tablet by mouth every 4 (four) hours as needed for severe pain. 10 tablet 0  . potassium chloride (KLOR-CON) 8 MEQ tablet Take 1 tablet (8 mEq total) by mouth daily. 30 tablet 11  . sildenafil (VIAGRA) 100 MG tablet Take 1 tablet (100  mg total) by mouth as needed for erectile dysfunction. 12 tablet 5  . tamsulosin (FLOMAX) 0.4 MG CAPS capsule Take 1 capsule (0.4 mg total) by mouth daily. 30 capsule 3   Facility-Administered Medications Prior to Visit  Medication Dose Route Frequency Provider Last Rate Last Dose  . 0.9 %  sodium chloride infusion  500 mL Intravenous Continuous Gatha Mayer, MD        ROS Review of Systems  Constitutional: Negative for appetite change, fatigue and unexpected weight change.  HENT: Negative for congestion, nosebleeds, sneezing, sore throat and trouble swallowing.   Eyes: Negative for itching and visual disturbance.  Respiratory: Negative for cough.   Cardiovascular: Negative for chest pain, palpitations and leg swelling.  Gastrointestinal: Negative for abdominal distention, blood in stool, diarrhea and nausea.  Genitourinary: Positive for difficulty urinating. Negative for frequency and hematuria.  Musculoskeletal: Negative for back pain, gait problem, joint swelling and neck pain.  Skin: Negative for rash.  Neurological: Negative for dizziness, tremors, speech difficulty and weakness.  Psychiatric/Behavioral: Negative for agitation, dysphoric mood and sleep disturbance. The patient is not nervous/anxious.     Objective:  BP 140/88 (BP Location: Left Arm, Patient Position: Sitting, Cuff Size: Large)   Pulse 66   Temp 98 F (36.7 C) (Oral)   Ht 5'  9" (1.753 m)   Wt 232 lb (105.2 kg)   SpO2 99%   BMI 34.26 kg/m   BP Readings from Last 3 Encounters:  08/02/16 140/88  07/31/16 (!) 176/122  06/28/16 137/70    Wt Readings from Last 3 Encounters:  08/02/16 232 lb (105.2 kg)  07/31/16 230 lb (104.3 kg)  06/28/16 226 lb (102.5 kg)    Physical Exam  Constitutional: He is oriented to person, place, and time. He appears well-developed. No distress.  NAD  HENT:  Mouth/Throat: Oropharynx is clear and moist.  Eyes: Conjunctivae are normal. Pupils are equal, round, and reactive  to light.  Neck: Normal range of motion. No JVD present. No thyromegaly present.  Cardiovascular: Normal rate, regular rhythm, normal heart sounds and intact distal pulses.  Exam reveals no gallop and no friction rub.   No murmur heard. Pulmonary/Chest: Effort normal and breath sounds normal. No respiratory distress. He has no wheezes. He has no rales. He exhibits no tenderness.  Abdominal: Soft. Bowel sounds are normal. He exhibits no distension and no mass. There is no tenderness. There is no rebound and no guarding.  Musculoskeletal: Normal range of motion. He exhibits no edema or tenderness.  Lymphadenopathy:    He has no cervical adenopathy.  Neurological: He is alert and oriented to person, place, and time. He has normal reflexes. No cranial nerve deficit. He exhibits normal muscle tone. He displays a negative Romberg sign. Coordination and gait normal.  Skin: Skin is warm and dry. No rash noted.  Psychiatric: He has a normal mood and affect. His behavior is normal. Judgment and thought content normal.  Foley  Lab Results  Component Value Date   WBC 5.6 03/05/2016   HGB 11.2 (L) 03/05/2016   HCT 33.0 (L) 03/05/2016   PLT 214 03/05/2016   GLUCOSE 108 (H) 06/08/2016   CHOL 154 02/20/2013   TRIG 121.0 02/20/2013   HDL 48.60 02/20/2013   LDLCALC 81 02/20/2013   ALT 20 06/18/2015   AST 18 06/18/2015   NA 140 06/08/2016   K 3.5 06/08/2016   CL 102 06/08/2016   CREATININE 1.13 06/08/2016   BUN 13 06/08/2016   CO2 30 06/08/2016   TSH 0.93 04/03/2013   PSA 5.25 (H) 12/11/2013   INR 1.01 10/12/2014    No results found.  Assessment & Plan:   There are no diagnoses linked to this encounter. I am having [name] maintain his aspirin, tamsulosin, fluticasone, loratadine, sildenafil, amLODipine, cloNIDine, losartan, potassium chloride, lovastatin, indomethacin, HYDROcodone-acetaminophen, furosemide, nitrofurantoin, cephALEXin, oxyCODONE-acetaminophen, and ibuprofen. We will  continue to administer sodium chloride.  No orders of the defined types were placed in this encounter.    Follow-up: No Follow-up on file.  [name], MD

## 2016-08-02 NOTE — Assessment & Plan Note (Signed)
Norco prn Ibuprofen

## 2016-08-09 DIAGNOSIS — R310 Gross hematuria: Secondary | ICD-10-CM | POA: Diagnosis not present

## 2016-08-12 DIAGNOSIS — R310 Gross hematuria: Secondary | ICD-10-CM | POA: Diagnosis not present

## 2016-08-16 DIAGNOSIS — C61 Malignant neoplasm of prostate: Secondary | ICD-10-CM | POA: Diagnosis not present

## 2016-08-16 DIAGNOSIS — N400 Enlarged prostate without lower urinary tract symptoms: Secondary | ICD-10-CM | POA: Diagnosis not present

## 2016-08-16 DIAGNOSIS — R310 Gross hematuria: Secondary | ICD-10-CM | POA: Diagnosis not present

## 2016-08-17 DIAGNOSIS — G4733 Obstructive sleep apnea (adult) (pediatric): Secondary | ICD-10-CM | POA: Diagnosis not present

## 2016-08-17 DIAGNOSIS — R310 Gross hematuria: Secondary | ICD-10-CM | POA: Diagnosis not present

## 2016-08-31 ENCOUNTER — Emergency Department (HOSPITAL_COMMUNITY): Payer: Medicare Other

## 2016-08-31 ENCOUNTER — Telehealth: Payer: Self-pay | Admitting: Internal Medicine

## 2016-08-31 ENCOUNTER — Ambulatory Visit: Payer: Medicare Other | Admitting: Internal Medicine

## 2016-08-31 ENCOUNTER — Emergency Department (HOSPITAL_COMMUNITY)
Admission: EM | Admit: 2016-08-31 | Discharge: 2016-08-31 | Disposition: A | Discharge status: Home / Self Care | Payer: Medicare Other | Attending: Emergency Medicine | Admitting: Emergency Medicine

## 2016-08-31 ENCOUNTER — Encounter (HOSPITAL_COMMUNITY): Payer: Self-pay | Admitting: Emergency Medicine

## 2016-08-31 DIAGNOSIS — I10 Essential (primary) hypertension: Secondary | ICD-10-CM | POA: Insufficient documentation

## 2016-08-31 DIAGNOSIS — Z87891 Personal history of nicotine dependence: Secondary | ICD-10-CM | POA: Diagnosis not present

## 2016-08-31 DIAGNOSIS — R310 Gross hematuria: Secondary | ICD-10-CM | POA: Diagnosis not present

## 2016-08-31 DIAGNOSIS — Z79899 Other long term (current) drug therapy: Secondary | ICD-10-CM | POA: Diagnosis not present

## 2016-08-31 DIAGNOSIS — N390 Urinary tract infection, site not specified: Secondary | ICD-10-CM | POA: Diagnosis not present

## 2016-08-31 DIAGNOSIS — R109 Unspecified abdominal pain: Secondary | ICD-10-CM | POA: Diagnosis not present

## 2016-08-31 DIAGNOSIS — R319 Hematuria, unspecified: Secondary | ICD-10-CM

## 2016-08-31 DIAGNOSIS — R1032 Left lower quadrant pain: Secondary | ICD-10-CM | POA: Diagnosis present

## 2016-08-31 LAB — URINALYSIS, ROUTINE W REFLEX MICROSCOPIC
Bilirubin Urine: NEGATIVE
GLUCOSE, UA: NEGATIVE mg/dL
Ketones, ur: NEGATIVE mg/dL
NITRITE: NEGATIVE
PROTEIN: 100 mg/dL — AB
SPECIFIC GRAVITY, URINE: 1.011 (ref 1.005–1.030)
Squamous Epithelial / LPF: NONE SEEN
pH: 6 (ref 5.0–8.0)

## 2016-08-31 LAB — BASIC METABOLIC PANEL
Anion gap: 7 (ref 5–15)
BUN: 11 mg/dL (ref 6–20)
CALCIUM: 8.7 mg/dL — AB (ref 8.9–10.3)
CHLORIDE: 105 mmol/L (ref 101–111)
CO2: 29 mmol/L (ref 22–32)
Creatinine, Ser: 1.09 mg/dL (ref 0.61–1.24)
GFR calc non Af Amer: >60 mL/min (ref >60)
GLUCOSE: 124 mg/dL — AB (ref 65–99)
Potassium: 3.5 mmol/L (ref 3.5–5.1)
Sodium: 141 mmol/L (ref 135–145)

## 2016-08-31 LAB — CBC
HEMATOCRIT: 31.9 % — AB (ref 39.0–52.0)
HEMOGLOBIN: 10.9 g/dL — AB (ref 13.0–17.0)
MCH: 27.1 pg (ref 26.0–34.0)
MCHC: 34.2 g/dL (ref 30.0–36.0)
MCV: 79.4 fL (ref 78.0–100.0)
Platelets: 272 10*3/uL (ref 150–400)
RBC: 4.02 MIL/uL — ABNORMAL LOW (ref 4.22–5.81)
RDW: 14.6 % (ref 11.5–15.5)
WBC: 6.3 10*3/uL (ref 4.0–10.5)

## 2016-08-31 MED ORDER — SODIUM CHLORIDE 0.9 % IV BOLUS (SEPSIS)
1000.0000 mL | Freq: Once | INTRAVENOUS | Status: AC
  Administered 2016-08-31: 1000 mL via INTRAVENOUS

## 2016-08-31 MED ORDER — SULFAMETHOXAZOLE-TRIMETHOPRIM 800-160 MG PO TABS: 1.0000 | ORAL_TABLET | Freq: Two times a day (BID) | ORAL | 0 refills | Status: AC

## 2016-08-31 MED ORDER — ACETAMINOPHEN 325 MG PO TABS: 650.0000 mg | ORAL_TABLET | Freq: Three times a day (TID) | ORAL | 0 refills | Status: DC

## 2016-08-31 MED ORDER — SODIUM CHLORIDE 0.9 % IV SOLN
1000.0000 mL | INTRAVENOUS | Status: DC
  Administered 2016-08-31: 1000 mL via INTRAVENOUS

## 2016-08-31 MED ORDER — KETOROLAC TROMETHAMINE 30 MG/ML IJ SOLN
15.0000 mg | Freq: Once | INTRAMUSCULAR | Status: AC
  Administered 2016-08-31: 15 mg via INTRAVENOUS
  Filled 2016-08-31: qty 1

## 2016-08-31 MED ORDER — KETOROLAC TROMETHAMINE 30 MG/ML IJ SOLN: 30.0000 mg | Freq: Once | INTRAMUSCULAR | Status: DC

## 2016-08-31 MED ORDER — HYDROCODONE-ACETAMINOPHEN 5-325 MG PO TABS: 1.0000 | ORAL_TABLET | Freq: Three times a day (TID) | ORAL | 0 refills | Status: DC | PRN

## 2016-08-31 NOTE — ED Notes (Signed)
Lab called to add on culture  

## 2016-08-31 NOTE — ED Notes (Signed)
Sophia PA aware of high BP. Okay to d/c

## 2016-08-31 NOTE — ED Triage Notes (Signed)
Patient c/o left flank pain and hematuria since yesterday.  Blood is bright red. Patient denies dysuria.

## 2016-08-31 NOTE — Discharge Instructions (Signed)
Stay well-hydrated with water. Take antibiotics as prescribed. Use Tylenol as needed for pain. You may take Norco for severe breakthrough pain. It is very important that you call the urologist for further evaluation of your symptoms. Return to the emergency department if you develop fever, chills, vomiting, or any new or worsening symptoms.

## 2016-08-31 NOTE — ED Notes (Signed)
Patient transported to CT 

## 2016-08-31 NOTE — Telephone Encounter (Signed)
Patient Name: Jeffrey Carter  DOB: October 22, 1940    Initial Comment Caller says, husband has serious pain in back which started yesterday, got worse last night. He also has a lot of blood in urine today. Hard to sit up.    Nurse Assessment  Nurse: Raphael Gibney, RN, Vanita Ingles Date/Time (Eastern Time): 08/31/2016 9:33:09 AM  Confirm and document reason for call. If symptomatic, describe symptoms. ---Caller states spouse started having flank pain last evening. Has a lot of blood in his urine. Has appt at 3:45 today. Pain level 10. He can urinate. No fever. He can not even sit up due to the pain.  Does the patient have any new or worsening symptoms? ---Yes  Will a triage be completed? ---Yes  Related visit to physician within the last 2 weeks? ---No  Does the PT have any chronic conditions? (i.e. diabetes, asthma, etc.) ---Yes  List chronic conditions. ---prostate  Is this a behavioral health or substance abuse call? ---No     Guidelines    Guideline Title Affirmed Question Affirmed Notes  Flank Pain [1] SEVERE pain (e.g., excruciating, scale 8-10) AND [2] present > 1 hour    Final Disposition User   Go to ED Now Raphael Gibney, RN, Vanita Ingles    Comments  called back line and spoke to camryn who will cancel today's appt   Referrals  [facility] - ED   Disagree/Comply: Comply

## 2016-08-31 NOTE — ED Provider Notes (Signed)
Medical screening examination/treatment/procedure(s) were conducted as a shared visit with non-physician practitioner(s) and myself.  I personally evaluated the patient during the encounter.   EKG Interpretation None      76 year old male who presents with few days of hematuria and left flank pain. Denies fevers, nausea or vomiting, abdominal pain, dysuria or urinary frequency. Follows with Dr. Pilar Jarvis from urology.  Patient is nontoxic in no acute distress is soft and benign abdomen. With left-sided flank pain. Urinalysis is suggestive of infection with too numerous to count WBCs and RBCs, and many bacteria. CT renal stone protocol visualized and shows no evidence of kidney stone the presence of a bladder mass. Likely bleeding related to bladder mass with concurrent infection and possible pyelonephritis. He is afebrile, without leukocytosis, tolerating by mouth, without any vomiting or pain control issues. Discussed with Dr. Vernie Shanks who recommended outpatient follow-up for cystoscopy and oral antibiotics. Strict return and follow-up instructions reviewed. Patient and family expressed understanding of all discharge instructions and felt comfortable with the plan of care.    Forde Dandy, MD 08/31/16 [phone]

## 2016-08-31 NOTE — ED Notes (Signed)
Sophia, PA aware of patients red urine output. Awaiting to give pain medication until lab results are back.

## 2016-08-31 NOTE — ED Provider Notes (Signed)
[facility] DEPT Provider Note   CSN: [id number] Arrival date & time: 08/31/16  1127     History   Chief Complaint Chief Complaint  Patient presents with  . Flank Pain  . Hematuria    HPI Jeffrey Carter is a 76 y.o. male presented with left-sided flank pain 2 days.  Patient states that pain started yesterday morning when he was going about his normal routine. Pain is sharp and present when he moves, but is not present at rest. Additionally, patient reports bright red blood in his urine, and increased urinary frequency. Denies dysuria. He denies history of kidney stones or history of kidney problems. He endorses a history of gout. He has been taking his home medication including Norco and muscle relaxants minimal relief. He denies fever, chills, chest pain, shortness of breath, nausea, vomiting, abdominal pain, abnormal bowel movements. He denies fall, trauma, or injury. Patient with a history of urinary retention, BPH, bladder outlet obstruction, and hematuria. He has been seen in the emergency room for hematuria 3 times in the past 6 months.  HPI  Past Medical History:  Diagnosis Date  . Bladder outlet obstruction   . BPH (benign prostatic hyperplasia)   . Conjunctivitis, acute, bilateral    07-02-2015  per pcp note and started antibiotic drops  . First degree heart block   . History of acute conjunctivitis    07-02-2015  resolved after round of antibiotic eye drops  . History of gout    BIG TOE  . History of urinary retention 12/2014  . Hx of adenomatous colonic polyps 07/07/2016  . Hyperlipidemia   . Hypertension   . Lower urinary tract symptoms (LUTS)   . OSA on CPAP    MODERATE PER STUDY 03-08-2012  . Osteoarthritis   . PAC (premature atrial contraction)   . 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 S/P  radiation therapy 08-27-2014  to  10-22-2014   prostate 7800Gy in 40 sessions and seminal vesicals 5000Gy in 40 sessions  . Wears glasses     Patient Active Problem List   Diagnosis Date Noted  . Hx of adenomatous colonic polyps 07/07/2016  . Gout attack 06/08/2016  . Foot pain, left 06/08/2016  . Hypokalemia 04/21/2016  . 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 Dysuria 01/11/2016  . PAC (premature atrial contraction) 06/18/2015  . Acute sinusitis 12/31/2014  . Urination frequency 09/15/2014  . Cerumen impaction 07/14/2014  . Prostate cancer ([place]) 04/14/2014  . Low back pain radiating to right lower extremity 03/13/2014  . Mass of ear canal 01/29/2014  . Otitis, externa, infective 01/22/2014  . Edema 07/05/2013  . Erectile dysfunction 07/05/2013  . Gout of big toe 03/23/2013  . OSA (obstructive sleep apnea) 04/18/2012  . 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  . 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 Dyslipidemia 10/27/2006  . Essential hypertension 09/09/2006    Past Surgical History:  Procedure Laterality Date  . APPENDECTOMY  1966  . COLONOSCOPY  07-06-2006  . GOLD SEED IMPLANT N/A 08/12/2014   Procedure: GOLD SEED IMPLANT;  Surgeon: Lowella Bandy, MD;  Location: [facility];  Service: Urology;  Laterality: N/A;  . GREEN LIGHT LASER TURP (TRANSURETHRAL RESECTION OF PROSTATE N/A 07/20/2015   Procedure: GREEN LIGHT LASER ABLATION OF PROSTATE ;  Surgeon: Nickie Retort,  MD;  Location: [place];  Service: Urology;  Laterality: N/A;  . PROSTATE BIOPSY N/A 03/28/2014   Procedure: BIOPSY TRANSRECTAL ULTRASONIC PROSTATE (TUBP);  Surgeon: Arvil Persons, MD;  Location: [facility];  Service: Urology;  Laterality: N/A;  . TONSILLECTOMY  as child  . TOTAL KNEE ARTHROPLASTY Right 06-22-2007       Home Medications    Prior to Admission medications   Medication Sig Start Date End Date Taking? Authorizing Provider  amLODipine  (NORVASC) 5 MG tablet Take 1 tablet (5 mg total) by mouth daily. 01/11/16  Yes Plotnikov, Evie Lacks, MD  cloNIDine (CATAPRES) 0.3 MG tablet TAKE ONE TABLET BY MOUTH TWICE DAILY 03/16/16  Yes Plotnikov, Evie Lacks, MD  fluticasone (FLONASE) 50 MCG/ACT nasal spray Place 2 sprays into both nostrils daily. Patient taking differently: Place 2 sprays into both nostrils daily as needed for allergies.  03/11/15  Yes Chesley Mires, MD  furosemide (LASIX) 40 MG tablet TAKE ONE TABLET BY MOUTH ONCE DAILY AS NEEDED FOR  SWELLING 06/08/16  Yes Plotnikov, Evie Lacks, MD  ibuprofen (ADVIL,MOTRIN) 600 MG tablet Take 1 tablet (600 mg total) by mouth every 6 (six) hours as needed. Patient taking differently: Take 600 mg by mouth every 6 (six) hours as needed for moderate pain.  07/31/16  Yes Virgel Manifold, MD  loratadine (CLARITIN) 10 MG tablet TAKE ONE TABLET BY MOUTH ONCE DAILY 12/09/15  Yes Plotnikov, Evie Lacks, MD  losartan (COZAAR) 100 MG tablet TAKE ONE TABLET BY MOUTH ONCE DAILY 03/16/16  Yes Plotnikov, Evie Lacks, MD  lovastatin (MEVACOR) 40 MG tablet TAKE ONE TABLET BY MOUTH ONCE DAILY AT 6 PM 05/02/16  Yes Plotnikov, Evie Lacks, MD  potassium chloride (KLOR-CON) 8 MEQ tablet Take 1 tablet (8 mEq total) by mouth daily. 04/23/16 04/23/17 Yes Plotnikov, Evie Lacks, MD  tamsulosin (FLOMAX) 0.4 MG CAPS capsule Take 1 capsule (0.4 mg total) by mouth daily. Patient taking differently: Take 0.4 mg by mouth 2 (two) times daily.  09/15/14  Yes Arloa Koh, MD  acetaminophen (TYLENOL) 325 MG tablet Take 2 tablets (650 mg total) by mouth every 8 (eight) hours. 08/31/16   Zeriyah Wain, PA-C  cephALEXin (KEFLEX) 500 MG capsule Take 1 capsule (500 mg total) by mouth 3 (three) times daily. Patient not taking: Reported on 08/31/2016 07/31/16   Virgel Manifold, MD  HYDROcodone-acetaminophen (NORCO) 7.5-325 MG tablet Take 1 tablet by mouth 4 (four) times daily as needed for moderate pain. For gout attack Patient not taking: Reported on  08/31/2016 08/02/16   Plotnikov, Evie Lacks, MD  HYDROcodone-acetaminophen (NORCO/VICODIN) 5-325 MG tablet Take 1 tablet by mouth every 8 (eight) hours as needed. 08/31/16   Milaya Hora, PA-C  indomethacin (INDOCIN) 50 MG capsule Take 1 capsule (50 mg total) by mouth 3 (three) times daily as needed. For gout attack Patient not taking: Reported on 08/31/2016 06/08/16   Plotnikov, Evie Lacks, MD  sildenafil (VIAGRA) 100 MG tablet Take 1 tablet (100 mg total) by mouth as needed for erectile dysfunction. 01/11/16   Plotnikov, Evie Lacks, MD  sulfamethoxazole-trimethoprim (BACTRIM DS,SEPTRA DS) 800-160 MG tablet Take 1 tablet by mouth 2 (two) times daily. 08/31/16 09/07/16  Shenoa Hattabaugh, PA-C    Family History Family History  Problem Relation Age of Onset  . Diabetes Father   . Cancer Brother        pancreatic, stomach  . Colon cancer Neg Hx     Social History Social History  Substance Use Topics  .  Smoking status: Former Smoker    Packs/day: 0.25    Years: 8.00    Types: Cigarettes    Quit date: 01/24/1962  . Smokeless tobacco: Never Used  . Alcohol use No     Allergies   Percocet [oxycodone-acetaminophen]   Review of Systems Review of Systems  Constitutional: Negative for chills and fever.  HENT: Negative for congestion and sore throat.   Eyes: Negative for visual disturbance.  Respiratory: Negative for cough, chest tightness and shortness of breath.   Cardiovascular: Negative for chest pain.  Gastrointestinal: Negative for abdominal distention, abdominal pain, blood in stool, constipation, diarrhea, nausea and vomiting.  Genitourinary: Positive for flank pain, frequency and hematuria. Negative for dysuria.  Musculoskeletal: Negative for myalgias and neck pain.  Skin: Negative for rash.  Neurological: Negative for dizziness, weakness, light-headedness and headaches.  Hematological: Does not bruise/bleed easily.  Psychiatric/Behavioral: Negative for agitation and confusion.      Physical Exam Updated Vital Signs BP (!) 178/103   Pulse 79   Temp 97.9 F (36.6 C) (Oral)   Resp 18   SpO2 99%   Physical Exam  Constitutional: He is oriented to person, place, and time. He appears well-developed and well-nourished.  HENT:  Head: Normocephalic and atraumatic.  Eyes: Pupils are equal, round, and reactive to light. Conjunctivae and EOM are normal.  Neck: Normal range of motion.  Cardiovascular: Normal rate, regular rhythm and intact distal pulses.   Pulmonary/Chest: Effort normal and breath sounds normal. No respiratory distress. He has no wheezes.  Abdominal: Soft. Bowel sounds are normal. He exhibits no distension and no mass. There is no tenderness. There is no rigidity, no rebound and no guarding.  No abdominal pain or suprapubic tenderness. No CVA tenderness.  Musculoskeletal: Normal range of motion.  Tenderness of left lower back/side. Pain worse with straight leg raise, without radiation to the back of the leg or numbness or tingling. BLE strength intact bilaterally, color and warmth equal bilaterally, pedal pulses intact bilaterally, and sensation intact bilaterally  Lymphadenopathy:    He has no cervical adenopathy.  Neurological: He is alert and oriented to person, place, and time.  Skin: Skin is warm and dry.  Psychiatric: He has a normal mood and affect.  Nursing note and vitals reviewed.    ED Treatments / Results  Labs (all labs ordered are listed, but only abnormal results are displayed) Labs Reviewed  URINALYSIS, ROUTINE W REFLEX MICROSCOPIC - Abnormal; Notable for the following:       Result Value   Color, Urine RED (*)    APPearance CLOUDY (*)    Hgb urine dipstick LARGE (*)    Protein, ur 100 (*)    Leukocytes, UA LARGE (*)    Bacteria, UA MANY (*)    Non Squamous Epithelial 0-5 (*)    All other components within normal limits  CBC - Abnormal; Notable for the following:    RBC 4.02 (*)    Hemoglobin 10.9 (*)    HCT 31.9 (*)     All other components within normal limits  BASIC METABOLIC PANEL - Abnormal; Notable for the following:    Glucose, Bld 124 (*)    Calcium 8.7 (*)    All other components within normal limits  URINE CULTURE    EKG  EKG Interpretation None       Radiology Ct Renal Stone Study  Result Date: 08/31/2016 CLINICAL DATA:  Left flank pain and hematuria since last evening. Prior history of prostate cancer.  EXAM: CT ABDOMEN AND PELVIS WITHOUT CONTRAST TECHNIQUE: Multidetector CT imaging of the abdomen and pelvis was performed following the standard protocol without IV contrast. COMPARISON:  CT scan 03/22/2004 FINDINGS: Lower chest: The lung bases are clear acute process. Chronic scarring changes and mild lower lobe bronchiectasis. No worrisome pulmonary lesions. The heart is normal in size. No pericardial effusion. Hepatobiliary: Stable right hepatic lobe hemangioma based on prior examinations. No new lesions. No biliary dilatation. The gallbladder is grossly normal. Pancreas: No mass, inflammation or ductal dilatation. Spleen: Normal size.  No focal lesions. Adrenals/Urinary Tract: The adrenal glands are unremarkable. No renal, ureteral or bladder calculi. No obvious renal lesions. Simple appearing right renal cyst. Moderate diffuse bladder wall thickening and persistent enlarged prostate gland impressing on the base of bladder. Could not exclude bladder cancer on this examination. Contrast-enhanced study may be helpful for further evaluation. Scattered brachytherapy seeds are noted in the region of the prostate gland. Stomach/Bowel: The stomach, duodenum, small bowel and colon are grossly normal without oral contrast. No inflammatory changes, mass lesions or obstructive findings. The terminal ileum is normal. Colonic interposition noted. Descending and sigmoid diverticulosis without findings for acute diverticulitis. Vascular/Lymphatic: Minimal scattered atherosclerotic calcifications for age. No aneurysm.  No mesenteric or retroperitoneal mass or lymphadenopathy. Small scattered lymph nodes are stable. No pelvic mass or adenopathy. Reproductive: Enlarged prostate gland with mass effect on the base the bladder. Seminal vesicles are grossly normal. Other: No free pelvic fluid collections. No inguinal mass or adenopathy. Bilateral inguinal hernias containing fat. Musculoskeletal: No new lesions. IMPRESSION: 1. No renal, ureteral or bladder calculi. 2. Irregular thick walled bladder and possible intraluminal lesion. Recommend cystoscopy or contrast enhanced study. 3. Stable right hepatic lobe hemangioma. 4. A few small scattered sclerotic bone lesions are stable. Electronically Signed   By: Marijo Sanes M.D.   On: 08/31/2016 17:29    Procedures Procedures (including critical care time)  Medications Ordered in ED Medications  sodium chloride 0.9 % bolus 1,000 mL (0 mLs Intravenous Stopped 08/31/16 1609)  ketorolac (TORADOL) 30 MG/ML injection 15 mg (15 mg Intravenous Given 08/31/16 1622)     Initial Impression / Assessment and Plan / ED Course  I have reviewed the triage vital signs and the nursing notes.  Pertinent labs & imaging results that were available during my care of the patient were reviewed by me and considered in my medical decision making (see chart for details).     Patient presenting with left-sided low back pain and gross hematuria. Physical exam shows pain is not CVA tenderness. No abdominal pain. Will start IVF, order CBC, BMP, ketorolac for pain, and CT renal for stone. Urine shows too numerous to count WBCs and bacteria, and large leuks, in addition to gross hematuria. BMP reassuring with no change in kidney function, and CBC shows stable anemia, unchanged since last visit. CT renal negative for stone, which is bladder wall thickening with concern for neoplasm. Patient has had copious urine output, and just after urination, bladder scan showed 251 mL remaining in the bladder. Discussed  case with attending, and Dr. Oleta Mouse evaluated the patient. Case discussed with urology, and Dr. Diona Fanti recommended sulfa antibiotics and close outpatient follow-up. Patient to call the office to reschedule for sooner cystoscopy. Pain control with scheduled Tylenol and Norco for breakthrough pain. At this time, patient appears safe for discharge. Return precautions given. Patient states he understands and agrees to plan.  Final Clinical Impressions(s) / ED Diagnoses   Final diagnoses:  Gross  hematuria  Urinary tract infection with hematuria, site unspecified    New Prescriptions Discharge Medication List as of 08/31/2016  5:37 PM    START taking these medications   Details  acetaminophen (TYLENOL) 325 MG tablet Take 2 tablets (650 mg total) by mouth every 8 (eight) hours., Starting Wed 08/31/2016, Print    HYDROcodone-acetaminophen (NORCO/VICODIN) 5-325 MG tablet Take 1 tablet by mouth every 8 (eight) hours as needed., Starting Wed 08/31/2016, Print    sulfamethoxazole-trimethoprim (BACTRIM DS,SEPTRA DS) 800-160 MG tablet Take 1 tablet by mouth 2 (two) times daily., Starting Wed 08/31/2016, Until Wed 09/07/2016, Print         [place], Chyane Greer, PA-C 08/31/16 2306    Franchot Heidelberg, PA-C 08/31/16 2316    Forde Dandy, MD 09/01/16 [phone]

## 2016-09-01 DIAGNOSIS — M549 Dorsalgia, unspecified: Secondary | ICD-10-CM | POA: Diagnosis not present

## 2016-09-01 DIAGNOSIS — R310 Gross hematuria: Secondary | ICD-10-CM | POA: Diagnosis not present

## 2016-09-03 LAB — URINE CULTURE: Culture: >=100000 — AB

## 2016-09-04 ENCOUNTER — Telehealth: Payer: Self-pay

## 2016-09-04 NOTE — Progress Notes (Signed)
ED Antimicrobial Stewardship Positive Culture Follow Up   Jeffrey Carter is an 76 y.o. male who presented to [facility] on 08/31/2016 with a chief complaint of  Chief Complaint  Patient presents with  . Flank Pain  . Hematuria    Recent Results (from the past 720 hour(s))  Urine culture     Status: Abnormal   Collection Time: 08/31/16  1:28 PM  Result Value Ref Range Status   Specimen Description URINE, RANDOM  Final   Special Requests NONE  Final   Culture (A)  Final    >=100,000 COLONIES/mL PSEUDOMONAS AERUGINOSA 80,000 COLONIES/mL KLEBSIELLA PNEUMONIAE Confirmed Extended Spectrum Beta-Lactamase Producer (ESBL) Performed at [facility], [address], [place], [place] [address]    Report Status 09/03/2016 FINAL  Final   Organism ID, Bacteria PSEUDOMONAS AERUGINOSA (A)  Final   Organism ID, Bacteria KLEBSIELLA PNEUMONIAE (A)  Final      Susceptibility   Klebsiella pneumoniae - MIC*    AMPICILLIN >=32 RESISTANT Resistant     CEFAZOLIN >=64 RESISTANT Resistant     CEFTRIAXONE >=64 RESISTANT Resistant     CIPROFLOXACIN 2 INTERMEDIATE Intermediate     GENTAMICIN >=16 RESISTANT Resistant     IMIPENEM <=0.25 SENSITIVE Sensitive     NITROFURANTOIN 64 INTERMEDIATE Intermediate     TRIMETH/SULFA >=320 RESISTANT Resistant     AMPICILLIN/SULBACTAM >=32 RESISTANT Resistant     PIP/TAZO 16 SENSITIVE Sensitive     Extended ESBL POSITIVE Resistant     * 80,000 COLONIES/mL KLEBSIELLA PNEUMONIAE   Pseudomonas aeruginosa - MIC*    CEFTAZIDIME 2 SENSITIVE Sensitive     CIPROFLOXACIN <=0.25 SENSITIVE Sensitive     GENTAMICIN <=1 SENSITIVE Sensitive     IMIPENEM 2 SENSITIVE Sensitive     PIP/TAZO 8 SENSITIVE Sensitive     CEFEPIME 2 SENSITIVE Sensitive     * >=100,000 COLONIES/mL PSEUDOMONAS AERUGINOSA    [x]  Treated with Bactrim, organism resistant to prescribed antimicrobial  New antibiotic prescription: D/c Bactrim. Start Ciprofloxacin 500mg  PO BID x 5 days and have patient  follow-up with Urology. If having fevers, symptoms getting worse - may need to come in for IV antibiotics. No great PO options.  ED Provider: Arlean Hopping, PA   Elicia Lamp, PharmD, BCPS Clinical Pharmacist 09/04/2016 9:58 AM

## 2016-09-04 NOTE — Telephone Encounter (Signed)
Post ED Visit - Positive Culture Follow-up: Unsuccessful Patient Follow-up  Culture assessed and recommendations reviewed by:  []  Elenor Quinones, Pharm.D. []  Heide Guile, Pharm.D., BCPS AQ-ID []  Parks Neptune, Pharm.D., BCPS []  Alycia Rossetti, Pharm.D., BCPS []  Buckner, Pharm.D., BCPS, AAHIVP []  Legrand Como, Pharm.D., BCPS, AAHIVP []  Salome Arnt, PharmD, BCPS []  Dimitri Ped, PharmD, BCPS []  Vincenza Hews, PharmD, BCPS Hailey Randon Goldsmith Pharm D Positive urine culture  []  Patient discharged without antimicrobial prescription and treatment is now indicated [x]  Organism is resistant to prescribed ED discharge antimicrobial []  Patient with positive blood cultures   Unable to contact patient after 3 attempts, letter will be sent to address on file  Genia Del 09/04/2016, 11:21 AM

## 2016-09-07 DIAGNOSIS — H35363 Drusen (degenerative) of macula, bilateral: Secondary | ICD-10-CM | POA: Diagnosis not present

## 2016-09-07 DIAGNOSIS — H5203 Hypermetropia, bilateral: Secondary | ICD-10-CM | POA: Diagnosis not present

## 2016-09-07 DIAGNOSIS — H52222 Regular astigmatism, left eye: Secondary | ICD-10-CM | POA: Diagnosis not present

## 2016-09-07 DIAGNOSIS — H25813 Combined forms of age-related cataract, bilateral: Secondary | ICD-10-CM | POA: Diagnosis not present

## 2016-09-19 DIAGNOSIS — R310 Gross hematuria: Secondary | ICD-10-CM | POA: Diagnosis not present

## 2016-09-19 DIAGNOSIS — N400 Enlarged prostate without lower urinary tract symptoms: Secondary | ICD-10-CM | POA: Diagnosis not present

## 2016-09-19 DIAGNOSIS — C61 Malignant neoplasm of prostate: Secondary | ICD-10-CM | POA: Diagnosis not present

## 2016-09-20 DIAGNOSIS — R338 Other retention of urine: Secondary | ICD-10-CM | POA: Diagnosis not present

## 2016-09-20 DIAGNOSIS — C61 Malignant neoplasm of prostate: Secondary | ICD-10-CM | POA: Diagnosis not present

## 2016-09-21 ENCOUNTER — Other Ambulatory Visit: Payer: Self-pay | Admitting: Internal Medicine

## 2016-09-22 DIAGNOSIS — G4733 Obstructive sleep apnea (adult) (pediatric): Secondary | ICD-10-CM | POA: Diagnosis not present

## 2016-09-29 DIAGNOSIS — R338 Other retention of urine: Secondary | ICD-10-CM | POA: Diagnosis not present

## 2016-10-06 DIAGNOSIS — R3914 Feeling of incomplete bladder emptying: Secondary | ICD-10-CM | POA: Diagnosis not present

## 2016-10-06 DIAGNOSIS — R300 Dysuria: Secondary | ICD-10-CM | POA: Diagnosis not present

## 2016-10-12 ENCOUNTER — Emergency Department (HOSPITAL_COMMUNITY)
Admission: EM | Admit: 2016-10-12 | Discharge: 2016-10-12 | Disposition: A | Discharge status: Home / Self Care | Payer: Medicare Other | Attending: Emergency Medicine | Admitting: Emergency Medicine

## 2016-10-12 ENCOUNTER — Encounter (HOSPITAL_COMMUNITY): Payer: Self-pay | Admitting: Nurse Practitioner

## 2016-10-12 DIAGNOSIS — Z79899 Other long term (current) drug therapy: Secondary | ICD-10-CM | POA: Diagnosis not present

## 2016-10-12 DIAGNOSIS — Z8546 Personal history of malignant neoplasm of prostate: Secondary | ICD-10-CM | POA: Diagnosis not present

## 2016-10-12 DIAGNOSIS — Z96651 Presence of right artificial knee joint: Secondary | ICD-10-CM | POA: Insufficient documentation

## 2016-10-12 DIAGNOSIS — M62830 Muscle spasm of back: Secondary | ICD-10-CM

## 2016-10-12 DIAGNOSIS — I10 Essential (primary) hypertension: Secondary | ICD-10-CM | POA: Diagnosis not present

## 2016-10-12 DIAGNOSIS — Z87891 Personal history of nicotine dependence: Secondary | ICD-10-CM | POA: Diagnosis not present

## 2016-10-12 DIAGNOSIS — R109 Unspecified abdominal pain: Secondary | ICD-10-CM | POA: Diagnosis present

## 2016-10-12 DIAGNOSIS — R0602 Shortness of breath: Secondary | ICD-10-CM | POA: Diagnosis not present

## 2016-10-12 LAB — URINALYSIS, ROUTINE W REFLEX MICROSCOPIC
Bilirubin Urine: NEGATIVE
GLUCOSE, UA: NEGATIVE mg/dL
Ketones, ur: NEGATIVE mg/dL
Nitrite: NEGATIVE
PROTEIN: 100 mg/dL — AB
SQUAMOUS EPITHELIAL / LPF: NONE SEEN
Specific Gravity, Urine: 1.016 (ref 1.005–1.030)
pH: 5 (ref 5.0–8.0)

## 2016-10-12 LAB — COMPREHENSIVE METABOLIC PANEL
ALK PHOS: 105 U/L (ref 38–126)
ALT: 12 U/L — ABNORMAL LOW (ref 17–63)
ANION GAP: 10 (ref 5–15)
AST: 14 U/L — ABNORMAL LOW (ref 15–41)
Albumin: 4.2 g/dL (ref 3.5–5.0)
BILIRUBIN TOTAL: 0.7 mg/dL (ref 0.3–1.2)
BUN: 14 mg/dL (ref 6–20)
CALCIUM: 9.2 mg/dL (ref 8.9–10.3)
CO2: 27 mmol/L (ref 22–32)
Chloride: 103 mmol/L (ref 101–111)
Creatinine, Ser: 1.25 mg/dL — ABNORMAL HIGH (ref 0.61–1.24)
GFR, EST NON AFRICAN AMERICAN: 55 mL/min — AB (ref >60)
Glucose, Bld: 117 mg/dL — ABNORMAL HIGH (ref 65–99)
Potassium: 3.3 mmol/L — ABNORMAL LOW (ref 3.5–5.1)
Sodium: 140 mmol/L (ref 135–145)
TOTAL PROTEIN: 7.7 g/dL (ref 6.5–8.1)

## 2016-10-12 LAB — CBC
HEMATOCRIT: 36.1 % — AB (ref 39.0–52.0)
HEMOGLOBIN: 12.1 g/dL — AB (ref 13.0–17.0)
MCH: 26.8 pg (ref 26.0–34.0)
MCHC: 33.5 g/dL (ref 30.0–36.0)
MCV: 80 fL (ref 78.0–100.0)
Platelets: 190 10*3/uL (ref 150–400)
RBC: 4.51 MIL/uL (ref 4.22–5.81)
RDW: 15.7 % — ABNORMAL HIGH (ref 11.5–15.5)
WBC: 6.2 10*3/uL (ref 4.0–10.5)

## 2016-10-12 LAB — LIPASE, BLOOD: Lipase: 15 U/L (ref 11–51)

## 2016-10-12 MED ORDER — HYDROCODONE-ACETAMINOPHEN 5-325 MG PO TABS
2.0000 | ORAL_TABLET | Freq: Once | ORAL | Status: AC
  Administered 2016-10-12: 2 via ORAL
  Filled 2016-10-12: qty 2

## 2016-10-12 MED ORDER — METHOCARBAMOL 500 MG PO TABS: 500.0000 mg | ORAL_TABLET | Freq: Two times a day (BID) | ORAL | 0 refills | Status: DC

## 2016-10-12 MED ORDER — METHOCARBAMOL 500 MG PO TABS
500.0000 mg | ORAL_TABLET | Freq: Once | ORAL | Status: AC
  Administered 2016-10-12: 500 mg via ORAL
  Filled 2016-10-12: qty 1

## 2016-10-12 MED ORDER — HYDROCODONE-ACETAMINOPHEN 7.5-325 MG PO TABS: 1.0000 | ORAL_TABLET | Freq: Four times a day (QID) | ORAL | 0 refills | Status: DC | PRN

## 2016-10-12 NOTE — ED Triage Notes (Signed)
Patient states he began having right flank pain that radiates to back and across stomach around 9pm. States the pain causes shortness of breath. Reports about taking ibuprofen for the pain around 9-10pm with no relief. States there is pain when he urinates, denies urgency or frequency. Denies any recent fevers. Patient is A&O x4. Able to talk in complete sentences and ambulatory. LBM 10/11/16.

## 2016-10-12 NOTE — ED Notes (Signed)
ED Provider at bedside. 

## 2016-10-12 NOTE — ED Notes (Signed)
Bed: WTR5 Expected date:  Expected time:  Means of arrival:  Comments: 

## 2016-10-12 NOTE — ED Provider Notes (Signed)
[facility] DEPT Provider Note   CSN: [id number] Arrival date & time: 10/12/16  0008     History   Chief Complaint Chief Complaint  Patient presents with  . Shortness of Breath  . Flank Pain    HPI Jeffrey Carter is a 76 y.o. male.  Patient with HTN, HLD, OSA, PAC, chronic back pain presents with back pain. It is located in the thoracic back on the right of the thoracic spine. No new injury. His pain is dull and constant with sudden spikes in pain that are sharp and grabbing. This is typical of his usual back pain. No SOB, CP, nausea, abdominal pain. The pain is worse with certain movements. His wife contributes to history of corroborates that pain episode is similar to back pain history. No fever, cough.   The history is provided by the patient and the spouse. No language interpreter was used.    Past Medical History:  Diagnosis Date  . Bladder outlet obstruction   . BPH (benign prostatic hyperplasia)   . Conjunctivitis, acute, bilateral    07-02-2015  per pcp note and started antibiotic drops  . First degree heart block   . History of acute conjunctivitis    07-02-2015  resolved after round of antibiotic eye drops  . History of gout    BIG TOE  . History of urinary retention 12/2014  . Hx of adenomatous colonic polyps 07/07/2016  . Hyperlipidemia   . Hypertension   . Lower urinary tract symptoms (LUTS)   . OSA on CPAP    MODERATE PER STUDY 03-08-2012  . Osteoarthritis   . PAC (premature atrial contraction)   . 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 S/P radiation therapy 08-27-2014  to  10-22-2014   prostate 7800Gy in 40 sessions and seminal vesicals 5000Gy in 40 sessions  . Wears glasses     Patient Active Problem List   Diagnosis Date Noted  . Hx of adenomatous colonic polyps 07/07/2016  .  Gout attack 06/08/2016  . Foot pain, left 06/08/2016  . Hypokalemia 04/21/2016  . 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 Dysuria 01/11/2016  . PAC (premature atrial contraction) 06/18/2015  . Acute sinusitis 12/31/2014  . Urination frequency 09/15/2014  . Cerumen impaction 07/14/2014  . Prostate cancer ([place]) 04/14/2014  . Low back pain radiating to right lower extremity 03/13/2014  . Mass of ear canal 01/29/2014  . Otitis, externa, infective 01/22/2014  . Edema 07/05/2013  . Erectile dysfunction 07/05/2013  . Gout of big toe 03/23/2013  . OSA (obstructive sleep apnea) 04/18/2012  . 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  . 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 Dyslipidemia 10/27/2006  . Essential hypertension 09/09/2006    Past Surgical History:  Procedure Laterality Date  . APPENDECTOMY  1966  . COLONOSCOPY  07-06-2006  . GOLD SEED IMPLANT N/A 08/12/2014   Procedure: GOLD SEED IMPLANT;  Surgeon: Lowella Bandy, MD;  Location: [facility];  Service: Urology;  Laterality: N/A;  . GREEN LIGHT LASER TURP (TRANSURETHRAL RESECTION OF PROSTATE N/A 07/20/2015   Procedure: GREEN LIGHT LASER ABLATION OF PROSTATE ;  Surgeon: Nickie Retort, MD;  Location: [facility];  Service: Urology;  Laterality: N/A;  . PROSTATE BIOPSY N/A 03/28/2014   Procedure: BIOPSY TRANSRECTAL ULTRASONIC PROSTATE (TUBP);  Surgeon:  Arvil Persons, MD;  Location: [facility];  Service: Urology;  Laterality: N/A;  . TONSILLECTOMY  as child  . TOTAL KNEE ARTHROPLASTY Right 06-22-2007       Home Medications    Prior to Admission medications   Medication Sig Start Date End Date Taking? Authorizing Provider  acetaminophen (TYLENOL) 325 MG tablet Take 2 tablets (650 mg total) by mouth every 8 (eight) hours. 08/31/16   Caccavale, Sophia, PA-C  amLODipine (NORVASC) 5 MG tablet Take 1 tablet (5 mg total) by mouth daily. 01/11/16   Plotnikov, Evie Lacks, MD  cephALEXin (KEFLEX)  500 MG capsule Take 1 capsule (500 mg total) by mouth 3 (three) times daily. Patient not taking: Reported on 08/31/2016 07/31/16   Virgel Manifold, MD  cloNIDine (CATAPRES) 0.3 MG tablet TAKE ONE TABLET BY MOUTH TWICE DAILY 03/16/16   Plotnikov, Evie Lacks, MD  fluticasone (FLONASE) 50 MCG/ACT nasal spray Place 2 sprays into both nostrils daily. Patient taking differently: Place 2 sprays into both nostrils daily as needed for allergies.  03/11/15   Chesley Mires, MD  furosemide (LASIX) 40 MG tablet TAKE ONE TABLET BY MOUTH ONCE DAILY AS NEEDED FOR  SWELLING 06/08/16   Plotnikov, Evie Lacks, MD  HYDROcodone-acetaminophen (NORCO) 7.5-325 MG tablet Take 1 tablet by mouth 4 (four) times daily as needed for moderate pain. For gout attack Patient not taking: Reported on 08/31/2016 08/02/16   Plotnikov, Evie Lacks, MD  HYDROcodone-acetaminophen (NORCO/VICODIN) 5-325 MG tablet Take 1 tablet by mouth every 8 (eight) hours as needed. 08/31/16   Caccavale, Sophia, PA-C  ibuprofen (ADVIL,MOTRIN) 600 MG tablet Take 1 tablet (600 mg total) by mouth every 6 (six) hours as needed. Patient taking differently: Take 600 mg by mouth every 6 (six) hours as needed for moderate pain.  07/31/16   Virgel Manifold, MD  indomethacin (INDOCIN) 50 MG capsule Take 1 capsule (50 mg total) by mouth 3 (three) times daily as needed. For gout attack Patient not taking: Reported on 08/31/2016 06/08/16   Plotnikov, Evie Lacks, MD  loratadine (CLARITIN) 10 MG tablet TAKE ONE TABLET BY MOUTH ONCE DAILY 12/09/15   Plotnikov, Evie Lacks, MD  losartan (COZAAR) 100 MG tablet TAKE ONE TABLET BY MOUTH ONCE DAILY 03/16/16   Plotnikov, Evie Lacks, MD  lovastatin (MEVACOR) 40 MG tablet TAKE 1 TABLET BY MOUTH ONCE DAILY AT 6PM 09/21/16   Plotnikov, Evie Lacks, MD  potassium chloride (KLOR-CON) 8 MEQ tablet Take 1 tablet (8 mEq total) by mouth daily. 04/23/16 04/23/17  Plotnikov, Evie Lacks, MD  sildenafil (VIAGRA) 100 MG tablet Take 1 tablet (100 mg total) by mouth as needed for  erectile dysfunction. 01/11/16   Plotnikov, Evie Lacks, MD  tamsulosin (FLOMAX) 0.4 MG CAPS capsule Take 1 capsule (0.4 mg total) by mouth daily. Patient taking differently: Take 0.4 mg by mouth 2 (two) times daily.  09/15/14   Arloa Koh, MD    Family History Family History  Problem Relation Age of Onset  . Diabetes Father   . Cancer Brother        pancreatic, stomach  . Colon cancer Neg Hx     Social History Social History  Substance Use Topics  . Smoking status: Former Smoker    Packs/day: 0.25    Years: 8.00    Types: Cigarettes    Quit date: 01/24/1962  . Smokeless tobacco: Never Used  . Alcohol use No     Allergies   Percocet [oxycodone-acetaminophen]   Review of Systems Review  of Systems  Constitutional: Negative for chills and fever.  Respiratory: Negative.  Negative for cough and shortness of breath.   Cardiovascular: Negative.  Negative for chest pain.  Gastrointestinal: Negative.  Negative for abdominal pain, nausea and vomiting.  Musculoskeletal: Positive for back pain.  Skin: Negative.   Neurological: Negative.  Negative for weakness and light-headedness.     Physical Exam Updated Vital Signs BP (!) 163/102 (BP Location: Right Arm)   Pulse 84   Temp 98.2 F (36.8 C) (Oral)   Resp 18   Ht 5\' 9"  (1.753 m)   Wt 105.2 kg (232 lb)   SpO2 97%   BMI 34.26 kg/m   Physical Exam  Constitutional: He is oriented to person, place, and time. He appears well-developed and well-nourished.  HENT:  Head: Normocephalic.  Neck: Normal range of motion. Neck supple.  Cardiovascular: Normal rate and regular rhythm.   Pulmonary/Chest: Effort normal and breath sounds normal. He has no wheezes. He has no rales. He exhibits no tenderness.  Abdominal: Soft. Bowel sounds are normal. There is no tenderness. There is no rebound and no guarding.  Musculoskeletal: Normal range of motion.       Back:  Point tenderness to right thoracic back.  Neurological: He is alert  and oriented to person, place, and time.  Skin: Skin is warm and dry. No rash noted.  Psychiatric: He has a normal mood and affect.     ED Treatments / Results  Labs (all labs ordered are listed, but only abnormal results are displayed) Labs Reviewed  CBC - Abnormal; Notable for the following:       Result Value   Hemoglobin 12.1 (*)    HCT 36.1 (*)    RDW 15.7 (*)    All other components within normal limits  COMPREHENSIVE METABOLIC PANEL - Abnormal; Notable for the following:    Potassium 3.3 (*)    Glucose, Bld 117 (*)    Creatinine, Ser 1.25 (*)    AST 14 (*)    ALT 12 (*)    GFR calc non Af Amer 55 (*)    All other components within normal limits  URINALYSIS, ROUTINE W REFLEX MICROSCOPIC - Abnormal; Notable for the following:    APPearance CLOUDY (*)    Hgb urine dipstick MODERATE (*)    Protein, ur 100 (*)    Leukocytes, UA LARGE (*)    Bacteria, UA FEW (*)    All other components within normal limits  LIPASE, BLOOD    EKG  EKG Interpretation None       Radiology No results found.  Procedures Procedures (including critical care time)  Medications Ordered in ED Medications  methocarbamol (ROBAXIN) tablet 500 mg (500 mg Oral Given 10/12/16 0536)  HYDROcodone-acetaminophen (NORCO/VICODIN) 5-325 MG per tablet 2 tablet (2 tablets Oral Given 10/12/16 0536)     Initial Impression / Assessment and Plan / ED Course  I have reviewed the triage vital signs and the nursing notes.  Pertinent labs & imaging results that were available during my care of the patient were reviewed by me and considered in my medical decision making (see chart for details).     Patient presents with back pain identical to history of recurrent back pain that is periodic. No injury suspected. The pattern suggests muscle spasm. He is well appearing otherwise.   He is examined by Dr. Leonides Schanz. Will discharge home with Robaxin and Norco. Encouraged PCP follow up.  Final Clinical  Impressions(s) / ED  Diagnoses   Final diagnoses:  None   1. Muscle spasm of back  New Prescriptions New Prescriptions   No medications on file     Charlann Lange, Hershal Coria 10/12/16 0600    Ward, Delice Bison, DO 10/12/16 [phone]

## 2016-10-18 ENCOUNTER — Ambulatory Visit (INDEPENDENT_AMBULATORY_CARE_PROVIDER_SITE_OTHER): Payer: Medicare Other | Admitting: General Practice

## 2016-10-18 DIAGNOSIS — Z23 Encounter for immunization: Secondary | ICD-10-CM

## 2016-11-15 DIAGNOSIS — G4733 Obstructive sleep apnea (adult) (pediatric): Secondary | ICD-10-CM | POA: Diagnosis not present

## 2016-11-15 DIAGNOSIS — H25013 Cortical age-related cataract, bilateral: Secondary | ICD-10-CM | POA: Diagnosis not present

## 2016-11-15 DIAGNOSIS — H25043 Posterior subcapsular polar age-related cataract, bilateral: Secondary | ICD-10-CM | POA: Diagnosis not present

## 2016-11-15 DIAGNOSIS — H2511 Age-related nuclear cataract, right eye: Secondary | ICD-10-CM | POA: Diagnosis not present

## 2016-11-15 DIAGNOSIS — H2513 Age-related nuclear cataract, bilateral: Secondary | ICD-10-CM | POA: Diagnosis not present

## 2016-11-15 DIAGNOSIS — H18413 Arcus senilis, bilateral: Secondary | ICD-10-CM | POA: Diagnosis not present

## 2016-11-18 ENCOUNTER — Telehealth: Payer: Self-pay | Admitting: Internal Medicine

## 2016-11-18 MED ORDER — METHYLPREDNISOLONE 4 MG PO TBPK: ORAL_TABLET | ORAL | 0 refills | Status: DC

## 2016-11-18 NOTE — Telephone Encounter (Signed)
Pt came by and said that his ghout is flaring up and he needs is med called into the [facility] on file

## 2016-11-18 NOTE — Telephone Encounter (Signed)
Rx emailed Thx 

## 2016-11-21 ENCOUNTER — Other Ambulatory Visit: Payer: Self-pay | Admitting: Internal Medicine

## 2016-11-21 MED ORDER — HYDROCODONE-ACETAMINOPHEN 7.5-325 MG PO TABS: 1.0000 | ORAL_TABLET | Freq: Four times a day (QID) | ORAL | 0 refills | Status: DC | PRN

## 2016-11-21 MED ORDER — METHYLPREDNISOLONE 4 MG PO TBPK: ORAL_TABLET | ORAL | 0 refills | Status: DC

## 2016-11-21 NOTE — Telephone Encounter (Signed)
Notified pt MD sent rx over the weekend to [facility]...Jeffrey Carter

## 2016-11-21 NOTE — Progress Notes (Signed)
Gout pain and OA

## 2016-12-13 ENCOUNTER — Other Ambulatory Visit: Payer: Self-pay | Admitting: Internal Medicine

## 2016-12-16 ENCOUNTER — Other Ambulatory Visit: Payer: Self-pay | Admitting: Internal Medicine

## 2016-12-17 ENCOUNTER — Other Ambulatory Visit: Payer: Self-pay | Admitting: Internal Medicine

## 2016-12-19 DIAGNOSIS — C61 Malignant neoplasm of prostate: Secondary | ICD-10-CM | POA: Diagnosis not present

## 2016-12-21 DIAGNOSIS — G4733 Obstructive sleep apnea (adult) (pediatric): Secondary | ICD-10-CM | POA: Diagnosis not present

## 2016-12-23 ENCOUNTER — Telehealth: Payer: Self-pay | Admitting: Internal Medicine

## 2016-12-23 MED ORDER — HYDROCODONE-ACETAMINOPHEN 7.5-325 MG PO TABS: 1.0000 | ORAL_TABLET | Freq: Four times a day (QID) | ORAL | 0 refills | Status: DC | PRN

## 2016-12-23 MED ORDER — METHYLPREDNISOLONE 4 MG PO TBPK: ORAL_TABLET | ORAL | 0 refills | Status: DC

## 2016-12-23 NOTE — Telephone Encounter (Signed)
Done. Thx.

## 2016-12-23 NOTE — Telephone Encounter (Signed)
Please advise 

## 2016-12-23 NOTE — Telephone Encounter (Signed)
Pt came in for his refill of his  methylPREDNISolone (MEDROL DOSEPAK) 4 MG TBPK tablet And HYDROcodone-acetaminophen (NORCO) 7.5-325 MG tablet   Please advise on both refills,  Pt made appt for 01/25/2017, since missed last Fu

## 2016-12-26 DIAGNOSIS — N400 Enlarged prostate without lower urinary tract symptoms: Secondary | ICD-10-CM | POA: Diagnosis not present

## 2016-12-26 DIAGNOSIS — R310 Gross hematuria: Secondary | ICD-10-CM | POA: Diagnosis not present

## 2016-12-26 DIAGNOSIS — C61 Malignant neoplasm of prostate: Secondary | ICD-10-CM | POA: Diagnosis not present

## 2017-01-06 DIAGNOSIS — H2511 Age-related nuclear cataract, right eye: Secondary | ICD-10-CM | POA: Diagnosis not present

## 2017-01-06 DIAGNOSIS — H2512 Age-related nuclear cataract, left eye: Secondary | ICD-10-CM | POA: Diagnosis not present

## 2017-01-11 ENCOUNTER — Other Ambulatory Visit: Payer: Self-pay | Admitting: Internal Medicine

## 2017-01-23 DIAGNOSIS — H2512 Age-related nuclear cataract, left eye: Secondary | ICD-10-CM | POA: Diagnosis not present

## 2017-01-25 ENCOUNTER — Ambulatory Visit (INDEPENDENT_AMBULATORY_CARE_PROVIDER_SITE_OTHER): Payer: Medicare Other | Admitting: Internal Medicine

## 2017-01-25 ENCOUNTER — Other Ambulatory Visit (INDEPENDENT_AMBULATORY_CARE_PROVIDER_SITE_OTHER): Payer: Medicare Other

## 2017-01-25 ENCOUNTER — Encounter: Payer: Self-pay | Admitting: Internal Medicine

## 2017-01-25 DIAGNOSIS — M109 Gout, unspecified: Secondary | ICD-10-CM | POA: Diagnosis not present

## 2017-01-25 DIAGNOSIS — C61 Malignant neoplasm of prostate: Secondary | ICD-10-CM | POA: Diagnosis not present

## 2017-01-25 DIAGNOSIS — H6123 Impacted cerumen, bilateral: Secondary | ICD-10-CM | POA: Diagnosis not present

## 2017-01-25 DIAGNOSIS — I10 Essential (primary) hypertension: Secondary | ICD-10-CM | POA: Diagnosis not present

## 2017-01-25 DIAGNOSIS — E785 Hyperlipidemia, unspecified: Secondary | ICD-10-CM

## 2017-01-25 DIAGNOSIS — R600 Localized edema: Secondary | ICD-10-CM

## 2017-01-25 LAB — BASIC METABOLIC PANEL
BUN: 11 mg/dL (ref 6–23)
CALCIUM: 8.9 mg/dL (ref 8.4–10.5)
CO2: 31 mEq/L (ref 19–32)
CREATININE: 1.1 mg/dL (ref 0.40–1.50)
Chloride: 103 mEq/L (ref 96–112)
GFR: 83.99 mL/min (ref >60.00)
Glucose, Bld: 91 mg/dL (ref 70–99)
Potassium: 3.2 mEq/L — ABNORMAL LOW (ref 3.5–5.1)
Sodium: 141 mEq/L (ref 135–145)

## 2017-01-25 NOTE — Assessment & Plan Note (Signed)
Better  

## 2017-01-25 NOTE — Assessment & Plan Note (Signed)
Indocin and Norco prn

## 2017-01-25 NOTE — Assessment & Plan Note (Signed)
On Lovastatin

## 2017-01-25 NOTE — Progress Notes (Signed)
Subjective:  Patient ID: Jeffrey Carter, male    DOB: [birth year]  Age: 77 y.o. MRN: [id number]  CC: No chief complaint on file.   HPI KEADEN GUNNOE presents for HTN, LBP/OA, prostate ca f/u  Outpatient Medications Prior to Visit  Medication Sig Dispense Refill  . acetaminophen (TYLENOL) 325 MG tablet Take 2 tablets (650 mg total) by mouth every 8 (eight) hours. 30 tablet 0  . amLODipine (NORVASC) 5 MG tablet TAKE ONE TABLET BY MOUTH ONCE DAILY 90 tablet 1  . cephALEXin (KEFLEX) 500 MG capsule Take 1 capsule (500 mg total) by mouth 3 (three) times daily. 15 capsule 0  . cloNIDine (CATAPRES) 0.3 MG tablet TAKE 1 TABLET BY MOUTH TWICE DAILY 180 tablet 3  . EQ ALLERGY RELIEF 10 MG tablet TAKE ONE TABLET BY MOUTH ONCE DAILY 90 tablet 3  . fluticasone (FLONASE) 50 MCG/ACT nasal spray Place 2 sprays into both nostrils daily. (Patient taking differently: Place 2 sprays into both nostrils daily as needed for allergies. ) 16 g 2  . furosemide (LASIX) 40 MG tablet TAKE 1 TABLET BY MOUTH ONCE DAILY AS NEEDED FOR  SWELLING 90 tablet 3  . HYDROcodone-acetaminophen (NORCO) 7.5-325 MG tablet Take 1 tablet by mouth 4 (four) times daily as needed for moderate pain. 30 tablet 0  . ibuprofen (ADVIL,MOTRIN) 600 MG tablet Take 1 tablet (600 mg total) by mouth every 6 (six) hours as needed. (Patient taking differently: Take 600 mg by mouth every 6 (six) hours as needed for moderate pain. ) 20 tablet 0  . indomethacin (INDOCIN) 50 MG capsule Take 1 capsule (50 mg total) by mouth 3 (three) times daily as needed. For gout attack 60 capsule 1  . losartan (COZAAR) 100 MG tablet TAKE 1 TABLET BY MOUTH ONCE DAILY 90 tablet 2  . lovastatin (MEVACOR) 40 MG tablet TAKE 1 TABLET BY MOUTH ONCE DAILY AT 6PM 90 tablet 3  . methocarbamol (ROBAXIN) 500 MG tablet Take 1 tablet (500 mg total) by mouth 2 (two) times daily. 20 tablet 0  . methylPREDNISolone (MEDROL DOSEPAK) 4 MG TBPK tablet As directed for gout 21 tablet 0  .  potassium chloride (KLOR-CON) 8 MEQ tablet Take 1 tablet (8 mEq total) by mouth daily. 30 tablet 11  . sildenafil (VIAGRA) 100 MG tablet Take 1 tablet (100 mg total) by mouth as needed for erectile dysfunction. 12 tablet 5  . tamsulosin (FLOMAX) 0.4 MG CAPS capsule Take 1 capsule (0.4 mg total) by mouth daily. (Patient taking differently: Take 0.4 mg by mouth 2 (two) times daily. ) 30 capsule 3   Facility-Administered Medications Prior to Visit  Medication Dose Route Frequency Provider Last Rate Last Dose  . 0.9 %  sodium chloride infusion  500 mL Intravenous Continuous Gatha Mayer, MD        ROS Review of Systems  Constitutional: Negative for appetite change, fatigue and unexpected weight change.  HENT: Negative for congestion, nosebleeds, sneezing, sore throat and trouble swallowing.   Eyes: Negative for itching and visual disturbance.  Respiratory: Negative for cough.   Cardiovascular: Negative for chest pain, palpitations and leg swelling.  Gastrointestinal: Negative for abdominal distention, blood in stool, diarrhea and nausea.  Genitourinary: Negative for frequency and hematuria.  Musculoskeletal: Positive for arthralgias and back pain. Negative for gait problem, joint swelling and neck pain.  Skin: Negative for rash.  Neurological: Negative for dizziness, tremors, speech difficulty and weakness.  Psychiatric/Behavioral: Negative for agitation, dysphoric mood and sleep disturbance.  The patient is not nervous/anxious.     Objective:  BP 134/86 (BP Location: Right Arm, Patient Position: Sitting, Cuff Size: Large)   Pulse 72   Temp 98 F (36.7 C) (Oral)   Ht 5\' 9"  (1.753 m)   Wt 237 lb (107.5 kg)   SpO2 98%   BMI 35.00 kg/m   BP Readings from Last 3 Encounters:  01/25/17 134/86  10/12/16 (!) 161/101  08/31/16 (!) 178/103    Wt Readings from Last 3 Encounters:  01/25/17 237 lb (107.5 kg)  10/12/16 232 lb (105.2 kg)  08/02/16 232 lb (105.2 kg)    Physical Exam    Constitutional: He is oriented to person, place, and time. He appears well-developed. No distress.  NAD  HENT:  Mouth/Throat: Oropharynx is clear and moist.  Eyes: Conjunctivae are normal. Pupils are equal, round, and reactive to light.  Neck: Normal range of motion. No JVD present. No thyromegaly present.  Cardiovascular: Normal rate, regular rhythm, normal heart sounds and intact distal pulses. Exam reveals no gallop and no friction rub.  No murmur heard. Pulmonary/Chest: Effort normal and breath sounds normal. No respiratory distress. He has no wheezes. He has no rales. He exhibits no tenderness.  Abdominal: Soft. Bowel sounds are normal. He exhibits no distension and no mass. There is no tenderness. There is no rebound and no guarding.  Musculoskeletal: Normal range of motion. He exhibits no edema or tenderness.  Lymphadenopathy:    He has no cervical adenopathy.  Neurological: He is alert and oriented to person, place, and time. He has normal reflexes. No cranial nerve deficit. He exhibits normal muscle tone. He displays a negative Romberg sign. Coordination and gait normal.  Skin: Skin is warm and dry. No rash noted.  Psychiatric: He has a normal mood and affect. His behavior is normal. Judgment and thought content normal.  B ankle trace edema B wax   Procedure Note :     Procedure :  Ear irrigation   Indication:  Cerumen impaction B   Risks, including pain, dizziness, eardrum perforation, bleeding, infection and others as well as benefits were explained to the patient in detail. Verbal consent was obtained and the patient agreed to proceed.    We used "The Elephant Ear Irrigation Device" filled with lukewarm water for irrigation. A large amount wax was recovered. Procedure has also required manual wax removal with an ear wax curette and ear forceps.   Tolerated well. Complications: None.   Postprocedure instructions :  Call if problems.    Lab Results  Component Value  Date   WBC 6.2 10/12/2016   HGB 12.1 (L) 10/12/2016   HCT 36.1 (L) 10/12/2016   PLT 190 10/12/2016   GLUCOSE 117 (H) 10/12/2016   CHOL 154 02/20/2013   TRIG 121.0 02/20/2013   HDL 48.60 02/20/2013   LDLCALC 81 02/20/2013   ALT 12 (L) 10/12/2016   AST 14 (L) 10/12/2016   NA 140 10/12/2016   K 3.3 (L) 10/12/2016   CL 103 10/12/2016   CREATININE 1.25 (H) 10/12/2016   BUN 14 10/12/2016   CO2 27 10/12/2016   TSH 0.93 04/03/2013   PSA 5.25 (H) 12/11/2013   INR 1.01 10/12/2014    No results found.  Assessment & Plan:   There are no diagnoses linked to this encounter. I am having Juron B. Ribaudo maintain his tamsulosin, fluticasone, sildenafil, potassium chloride, indomethacin, cephALEXin, ibuprofen, acetaminophen, lovastatin, methocarbamol, EQ ALLERGY RELIEF, losartan, cloNIDine, furosemide, HYDROcodone-acetaminophen, methylPREDNISolone, and amLODipine. We  will continue to administer sodium chloride.  No orders of the defined types were placed in this encounter.    Follow-up: No Follow-up on file.  [name], MD

## 2017-01-25 NOTE — Assessment & Plan Note (Signed)
See procedure 

## 2017-01-25 NOTE — Assessment & Plan Note (Signed)
XRT - finised Lupron

## 2017-01-25 NOTE — Assessment & Plan Note (Signed)
Amlodipine, Catapress, Furosemide, Losartan °

## 2017-01-29 ENCOUNTER — Other Ambulatory Visit: Payer: Self-pay | Admitting: Internal Medicine

## 2017-01-29 MED ORDER — POTASSIUM CHLORIDE ER 8 MEQ PO TBCR: 8.0000 meq | EXTENDED_RELEASE_TABLET | Freq: Two times a day (BID) | ORAL | 11 refills | Status: DC

## 2017-02-15 ENCOUNTER — Other Ambulatory Visit: Payer: Self-pay | Admitting: Internal Medicine

## 2017-02-15 DIAGNOSIS — G4733 Obstructive sleep apnea (adult) (pediatric): Secondary | ICD-10-CM | POA: Diagnosis not present

## 2017-02-15 NOTE — Telephone Encounter (Signed)
Routing to dr plotnikov, please advise, thanks 

## 2017-03-21 DIAGNOSIS — C61 Malignant neoplasm of prostate: Secondary | ICD-10-CM | POA: Diagnosis not present

## 2017-03-21 DIAGNOSIS — G4733 Obstructive sleep apnea (adult) (pediatric): Secondary | ICD-10-CM | POA: Diagnosis not present

## 2017-03-27 ENCOUNTER — Ambulatory Visit: Payer: Medicare Other

## 2017-03-29 DIAGNOSIS — N400 Enlarged prostate without lower urinary tract symptoms: Secondary | ICD-10-CM | POA: Diagnosis not present

## 2017-03-29 DIAGNOSIS — R310 Gross hematuria: Secondary | ICD-10-CM | POA: Diagnosis not present

## 2017-03-29 DIAGNOSIS — C61 Malignant neoplasm of prostate: Secondary | ICD-10-CM | POA: Diagnosis not present

## 2017-04-20 ENCOUNTER — Other Ambulatory Visit: Payer: Self-pay | Admitting: Internal Medicine

## 2017-04-25 ENCOUNTER — Ambulatory Visit (INDEPENDENT_AMBULATORY_CARE_PROVIDER_SITE_OTHER): Payer: Medicare Other | Admitting: *Deleted

## 2017-04-25 ENCOUNTER — Ambulatory Visit (INDEPENDENT_AMBULATORY_CARE_PROVIDER_SITE_OTHER): Payer: Medicare Other | Admitting: Internal Medicine

## 2017-04-25 ENCOUNTER — Encounter: Payer: Self-pay | Admitting: Internal Medicine

## 2017-04-25 VITALS — BP 154/86 | HR 98 | Resp 18 | Ht 69.0 in | Wt 242.0 lb

## 2017-04-25 DIAGNOSIS — Z0000 Encounter for general adult medical examination without abnormal findings: Secondary | ICD-10-CM | POA: Diagnosis not present

## 2017-04-25 DIAGNOSIS — H6123 Impacted cerumen, bilateral: Secondary | ICD-10-CM

## 2017-04-25 DIAGNOSIS — C61 Malignant neoplasm of prostate: Secondary | ICD-10-CM | POA: Diagnosis not present

## 2017-04-25 DIAGNOSIS — R1011 Right upper quadrant pain: Secondary | ICD-10-CM | POA: Diagnosis not present

## 2017-04-25 DIAGNOSIS — I10 Essential (primary) hypertension: Secondary | ICD-10-CM

## 2017-04-25 DIAGNOSIS — E785 Hyperlipidemia, unspecified: Secondary | ICD-10-CM | POA: Diagnosis not present

## 2017-04-25 DIAGNOSIS — E876 Hypokalemia: Secondary | ICD-10-CM

## 2017-04-25 DIAGNOSIS — Z0001 Encounter for general adult medical examination with abnormal findings: Secondary | ICD-10-CM

## 2017-04-25 DIAGNOSIS — M1000 Idiopathic gout, unspecified site: Secondary | ICD-10-CM | POA: Diagnosis not present

## 2017-04-25 MED ORDER — POTASSIUM CHLORIDE ER 8 MEQ PO TBCR: 8.0000 meq | EXTENDED_RELEASE_TABLET | Freq: Every day | ORAL | 11 refills | Status: DC

## 2017-04-25 NOTE — Assessment & Plan Note (Signed)
Medrol prn

## 2017-04-25 NOTE — Patient Instructions (Addendum)
Continue doing brain stimulating activities (puzzles, reading, adult coloring books, staying active) to keep memory sharp.   Continue to eat heart healthy diet (full of fruits, vegetables, whole grains, lean protein, water--limit salt, fat, and sugar intake) and increase physical activity as tolerated.   Jeffrey Carter , Thank you for taking time to come for your Medicare Wellness Visit. I appreciate your ongoing commitment to your health goals. Please review the following plan we discussed and let me know if I can assist you in the future.   These are the goals we discussed: Goals    . Patient Stated     Continue to walk daily, eat healthy, enjoy life, family and stay independent    . Weight (lb) < 219 lb (99.3 kg)     Start to walk at the [facility] once a week. Increase water. Eat low salt, low cholesterol diet.        This is a list of the screening recommended for you and due dates:  Health Maintenance  Topic Date Due  . Flu Shot  08/24/2017  . Tetanus Vaccine  11/24/2020  . Colon Cancer Screening  06/28/2021  . Pneumonia vaccines  Completed     Health Maintenance, Male A healthy lifestyle and preventive care is important for your health and wellness. Ask your health care provider about what schedule of regular examinations is right for you. What should I know about weight and diet? Eat a Healthy Diet  Eat plenty of vegetables, fruits, whole grains, low-fat dairy products, and lean protein.  Do not eat a lot of foods high in solid fats, added sugars, or salt.  Maintain a Healthy Weight Regular exercise can help you achieve or maintain a healthy weight. You should:  Do at least 150 minutes of exercise each week. The exercise should increase your heart rate and make you sweat (moderate-intensity exercise).  Do strength-training exercises at least twice a week.  Watch Your Levels of Cholesterol and Blood Lipids  Have your blood tested for lipids and cholesterol every 5  years starting at 75 years of age. If you are at high risk for heart disease, you should start having your blood tested when you are 75 years old. You may need to have your cholesterol levels checked more often if: ? Your lipid or cholesterol levels are high. ? You are older than 75 years of age. ? You are at high risk for heart disease.  What should I know about cancer screening? Many types of cancers can be detected early and may often be prevented. Lung Cancer  You should be screened every year for lung cancer if: ? You are a current smoker who has smoked for at least 30 years. ? You are a former smoker who has quit within the past 15 years.  Talk to your health care provider about your screening options, when you should start screening, and how often you should be screened.  Colorectal Cancer  Routine colorectal cancer screening usually begins at 75 years of age and should be repeated every 5-10 years until you are 75 years old. You may need to be screened more often if early forms of precancerous polyps or small growths are found. Your health care provider may recommend screening at an earlier age if you have risk factors for colon cancer.  Your health care provider may recommend using home test kits to check for hidden blood in the stool.  A small camera at the end of a tube  can be used to examine your colon (sigmoidoscopy or colonoscopy). This checks for the earliest forms of colorectal cancer.  Prostate and Testicular Cancer  Depending on your age and overall health, your health care provider may do certain tests to screen for prostate and testicular cancer.  Talk to your health care provider about any symptoms or concerns you have about testicular or prostate cancer.  Skin Cancer  Check your skin from head to toe regularly.  Tell your health care provider about any new moles or changes in moles, especially if: ? There is a change in a mole's size, shape, or color. ? You  have a mole that is larger than a pencil eraser.  Always use sunscreen. Apply sunscreen liberally and repeat throughout the day.  Protect yourself by wearing long sleeves, pants, a wide-brimmed hat, and sunglasses when outside.  What should I know about heart disease, diabetes, and high blood pressure?  If you are 88-70 years of age, have your blood pressure checked every 3-5 years. If you are 8 years of age or older, have your blood pressure checked every year. You should have your blood pressure measured twice-once when you are at a hospital or clinic, and once when you are not at a hospital or clinic. Record the average of the two measurements. To check your blood pressure when you are not at a hospital or clinic, you can use: ? An automated blood pressure machine at a pharmacy. ? A home blood pressure monitor.  Talk to your health care provider about your target blood pressure.  If you are between 8-22 years old, ask your health care provider if you should take aspirin to prevent heart disease.  Have regular diabetes screenings by checking your fasting blood sugar level. ? If you are at a normal weight and have a low risk for diabetes, have this test once every three years after the age of 98. ? If you are overweight and have a high risk for diabetes, consider being tested at a younger age or more often.  A one-time screening for abdominal aortic aneurysm (AAA) by ultrasound is recommended for men aged 63-75 years who are current or former smokers. What should I know about preventing infection? Hepatitis B If you have a higher risk for hepatitis B, you should be screened for this virus. Talk with your health care provider to find out if you are at risk for hepatitis B infection. Hepatitis C Blood testing is recommended for:  Everyone born from [birth year] through 1963.  Anyone with known risk factors for hepatitis C.  Sexually Transmitted Diseases (STDs)  You should be screened each  year for STDs including gonorrhea and chlamydia if: ? You are sexually active and are younger than 76 years of age. ? You are older than 75 years of age and your health care provider tells you that you are at risk for this type of infection. ? Your sexual activity has changed since you were last screened and you are at an increased risk for chlamydia or gonorrhea. Ask your health care provider if you are at risk.  Talk with your health care provider about whether you are at high risk of being infected with HIV. Your health care provider may recommend a prescription medicine to help prevent HIV infection.  What else can I do?  Schedule regular health, dental, and eye exams.  Stay current with your vaccines (immunizations).  Do not use any tobacco products, such as cigarettes, chewing tobacco, and  e-cigarettes. If you need help quitting, ask your health care provider.  Limit alcohol intake to no more than 2 drinks per day. One drink equals 12 ounces of beer, 5 ounces of [place], or 1 ounces of hard liquor.  Do not use street drugs.  Do not share needles.  Ask your health care provider for help if you need support or information about quitting drugs.  Tell your health care provider if you often feel depressed.  Tell your health care provider if you have ever been abused or do not feel safe at home. This information is not intended to replace advice given to you by your health care provider. Make sure you discuss any questions you have with your health care provider. Document Released: 07/09/2007 Document Revised: 09/09/2015 Document Reviewed: 10/14/2014 Elsevier Interactive Patient Education  [employer].

## 2017-04-25 NOTE — Progress Notes (Signed)
Subjective:  Patient ID: Jeffrey Carter, male    DOB: Jun 28, 1940  Age: 77 y.o. MRN: [id number]  CC: No chief complaint on file.   HPI Jeffrey Carter presents for a well exam F/u HTN, prostate ca, LBP C/o occ R abd pain in the RUQ  Outpatient Medications Prior to Visit  Medication Sig Dispense Refill  . acetaminophen (TYLENOL) 325 MG tablet Take 2 tablets (650 mg total) by mouth every 8 (eight) hours. 30 tablet 0  . amLODipine (NORVASC) 5 MG tablet TAKE ONE TABLET BY MOUTH ONCE DAILY 90 tablet 1  . cloNIDine (CATAPRES) 0.3 MG tablet TAKE 1 TABLET BY MOUTH TWICE DAILY 180 tablet 3  . diazepam (VALIUM) 5 MG tablet TAKE ONE TABLET BY MOUTH EVERY 12 HOURS AS NEEDED FOR ANXIETY OR  MUSCLE  SPASMS 30 tablet 2  . EQ ALLERGY RELIEF 10 MG tablet TAKE ONE TABLET BY MOUTH ONCE DAILY 90 tablet 3  . fluticasone (FLONASE) 50 MCG/ACT nasal spray Place 2 sprays into both nostrils daily. (Patient taking differently: Place 2 sprays into both nostrils daily as needed for allergies. ) 16 g 2  . furosemide (LASIX) 40 MG tablet TAKE 1 TABLET BY MOUTH ONCE DAILY AS NEEDED FOR  SWELLING 90 tablet 3  . HYDROcodone-acetaminophen (NORCO) 7.5-325 MG tablet Take 1 tablet by mouth 4 (four) times daily as needed for moderate pain. 30 tablet 0  . ibuprofen (ADVIL,MOTRIN) 600 MG tablet Take 1 tablet (600 mg total) by mouth every 6 (six) hours as needed. (Patient taking differently: Take 600 mg by mouth every 6 (six) hours as needed for moderate pain. ) 20 tablet 0  . indomethacin (INDOCIN) 50 MG capsule Take 1 capsule (50 mg total) by mouth 3 (three) times daily as needed. For gout attack 60 capsule 1  . losartan (COZAAR) 100 MG tablet TAKE 1 TABLET BY MOUTH ONCE DAILY 90 tablet 2  . lovastatin (MEVACOR) 40 MG tablet TAKE 1 TABLET BY MOUTH ONCE DAILY AT 6PM 90 tablet 3  . methocarbamol (ROBAXIN) 500 MG tablet Take 1 tablet (500 mg total) by mouth 2 (two) times daily. 20 tablet 0  . methylPREDNISolone (MEDROL DOSEPAK)  4 MG TBPK tablet As directed for gout 21 tablet 0  . potassium chloride (KLOR-CON) 8 MEQ tablet Take 1 tablet (8 mEq total) by mouth 2 (two) times daily. 60 tablet 11  . potassium chloride (KLOR-CON) 8 MEQ tablet TAKE 1 TABLET BY MOUTH ONCE DAILY 30 tablet 11  . sildenafil (VIAGRA) 100 MG tablet Take 1 tablet (100 mg total) by mouth as needed for erectile dysfunction. 12 tablet 5  . tamsulosin (FLOMAX) 0.4 MG CAPS capsule Take 1 capsule (0.4 mg total) by mouth daily. (Patient taking differently: Take 0.4 mg by mouth 2 (two) times daily. ) 30 capsule 3   Facility-Administered Medications Prior to Visit  Medication Dose Route Frequency Provider Last Rate Last Dose  . 0.9 %  sodium chloride infusion  500 mL Intravenous Continuous Gatha Mayer, MD        ROS Review of Systems  Constitutional: Positive for fatigue. Negative for appetite change and unexpected weight change.  HENT: Negative for congestion, nosebleeds, sneezing, sore throat and trouble swallowing.   Eyes: Negative for itching and visual disturbance.  Respiratory: Negative for cough.   Cardiovascular: Negative for chest pain, palpitations and leg swelling.  Gastrointestinal: Negative for abdominal distention, blood in stool, diarrhea and nausea.  Genitourinary: Negative for frequency and hematuria.  Musculoskeletal: Positive  for arthralgias and back pain. Negative for gait problem, joint swelling and neck pain.  Skin: Negative for rash.  Neurological: Negative for dizziness, tremors, speech difficulty and weakness.  Psychiatric/Behavioral: Negative for agitation, dysphoric mood, sleep disturbance and suicidal ideas. The patient is not nervous/anxious.     Objective:  BP (!) 154/86 (BP Location: Right Arm, Patient Position: Sitting, Cuff Size: Large)   Pulse 98   Temp 98.2 F (36.8 C) (Oral)   Ht 5\' 9"  (1.753 m)   Wt 242 lb (109.8 kg)   SpO2 99%   BMI 35.74 kg/m   BP Readings from Last 3 Encounters:  04/25/17 (!)  154/86  01/25/17 134/86  10/12/16 (!) 161/101    Wt Readings from Last 3 Encounters:  04/25/17 242 lb (109.8 kg)  01/25/17 237 lb (107.5 kg)  10/12/16 232 lb (105.2 kg)    Physical Exam  Constitutional: He is oriented to person, place, and time. He appears well-developed. No distress.  NAD  HENT:  Mouth/Throat: Oropharynx is clear and moist.  Eyes: Pupils are equal, round, and reactive to light. Conjunctivae are normal.  Neck: Normal range of motion. No JVD present. No thyromegaly present.  Cardiovascular: Normal rate, regular rhythm, normal heart sounds and intact distal pulses. Exam reveals no gallop and no friction rub.  No murmur heard. Pulmonary/Chest: Effort normal and breath sounds normal. No respiratory distress. He has no wheezes. He has no rales. He exhibits no tenderness.  Abdominal: Soft. Bowel sounds are normal. He exhibits no distension and no mass. There is no tenderness. There is no rebound and no guarding.  Musculoskeletal: Normal range of motion. He exhibits tenderness. He exhibits no edema.  Lymphadenopathy:    He has no cervical adenopathy.  Neurological: He is alert and oriented to person, place, and time. He has normal reflexes. No cranial nerve deficit. He exhibits normal muscle tone. He displays a negative Romberg sign. Coordination and gait normal.  Skin: Skin is warm and dry. No rash noted.  Psychiatric: He has a normal mood and affect. His behavior is normal. Judgment and thought content normal.  LS tender w/ROM B wax   Procedure Note :     Procedure :  Ear irrigation   Indication:  Cerumen impaction   Risks, including pain, dizziness, eardrum perforation, bleeding, infection and others as well as benefits were explained to the patient in detail. Verbal consent was obtained and the patient agreed to proceed.    We used "The Elephant Ear Irrigation Device" filled with lukewarm water for irrigation. A large amount wax was recovered. Procedure has also  required manual wax removal with an ear wax curette and ear forceps.   Tolerated well. Complications: None.   Postprocedure instructions :  Call if problems.    Lab Results  Component Value Date   WBC 6.2 10/12/2016   HGB 12.1 (L) 10/12/2016   HCT 36.1 (L) 10/12/2016   PLT 190 10/12/2016   GLUCOSE 91 01/25/2017   CHOL 154 02/20/2013   TRIG 121.0 02/20/2013   HDL 48.60 02/20/2013   LDLCALC 81 02/20/2013   ALT 12 (L) 10/12/2016   AST 14 (L) 10/12/2016   NA 141 01/25/2017   K 3.2 (L) 01/25/2017   CL 103 01/25/2017   CREATININE 1.10 01/25/2017   BUN 11 01/25/2017   CO2 31 01/25/2017   TSH 0.93 04/03/2013   PSA 5.25 (H) 12/11/2013   INR 1.01 10/12/2014    No results found.  Assessment & Plan:  There are no diagnoses linked to this encounter. I am having Suvan B. Tedesco maintain his tamsulosin, fluticasone, sildenafil, indomethacin, ibuprofen, acetaminophen, lovastatin, methocarbamol, EQ ALLERGY RELIEF, losartan, cloNIDine, furosemide, HYDROcodone-acetaminophen, methylPREDNISolone, amLODipine, potassium chloride, diazepam, and potassium chloride. We will continue to administer sodium chloride.  No orders of the defined types were placed in this encounter.    Follow-up: No follow-ups on file.  [name], MD

## 2017-04-25 NOTE — Assessment & Plan Note (Signed)
?  MSK - abd CT was ok in 8/18 Tylenol prn

## 2017-04-25 NOTE — Assessment & Plan Note (Signed)
Lupron shots

## 2017-04-25 NOTE — Assessment & Plan Note (Signed)
Amlodipine, Catapress, Furosemide, Losartan °

## 2017-04-25 NOTE — Assessment & Plan Note (Signed)
Here for medicare wellness/physical  Diet: heart healthy  Physical activity: not sedentary  Depression/mood screen: negative  Hearing: intact to whispered voice  Visual acuity: grossly normal, performs annual eye exam  ADLs: capable  Fall risk: low to none  Home safety: good  Cognitive evaluation: intact to orientation, naming, recall and repetition  EOL planning: adv directives, full code/ I agree  I have personally reviewed and have noted  1. The patient's medical, surgical and social history  2. Their use of alcohol, tobacco or illicit drugs  3. Their current medications and supplements  4. The patient's functional ability including ADL's, fall risks, home safety risks and hearing or visual impairment.  5. Diet and physical activities  6. Evidence for depression or mood disorders 7. The roster of all physicians providing medical care to patient - is listed in the Snapshot section of the chart and reviewed today.    Today patient counseled on age appropriate routine health concerns for screening and prevention, each reviewed and up to date or declined. Immunizations reviewed and up to date or declined. Labs ordered and reviewed. Risk factors for depression reviewed and negative. Hearing function and visual acuity are intact. ADLs screened and addressed as needed. Functional ability and level of safety reviewed and appropriate. Education, counseling and referrals performed based on assessed risks today. Patient provided with a copy of personalized plan for preventive services.  Last colon - 2018

## 2017-04-25 NOTE — Progress Notes (Addendum)
Subjective:   Jeffrey Carter is a 77 y.o. male who presents for Medicare Annual/Subsequent preventive examination.  Review of Systems:  No ROS.  Medicare Wellness Visit. Additional risk factors are reflected in the social history.  Cardiac Risk Factors include: advanced age (>50men, >72 women);dyslipidemia;hypertension;male gender Sleep patterns: gets up 2 times nightly to void and sleeps 7 hours nightly.    Home Safety/Smoke Alarms: Feels safe in home. Smoke alarms in place.  Living environment; residence and Firearm Safety: 1-story house/ trailer, no firearms. Lives with wife, no needs for DME, good support system Seat Belt Safety/Bike Helmet: Wears seat belt.   PSA-  Lab Results  Component Value Date   PSA 5.25 (H) 12/11/2013   PSA 7.33 (H) 04/03/2013   PSA 5.07 (H) 05/30/2006       Objective:    Vitals: BP (!) 154/86   Pulse 98   Resp 18   Ht 5\' 9"  (1.753 m)   Wt 242 lb (109.8 kg)   SpO2 99%   BMI 35.74 kg/m   Body mass index is 35.74 kg/m.  Advanced Directives 04/25/2017 10/12/2016 08/31/2016 07/31/2016 06/14/2016 03/21/2016 03/21/2016  Does Patient Have a Medical Advance Directive? No No No No No - No  Does patient want to make changes to medical advance directive? - - - - - Yes (ED - Information included in AVS) Yes (ED - Information included in AVS)  Would patient like information on creating a medical advance directive? Yes (ED - Information included in AVS) No - Patient declined No - Patient declined - - - -    Tobacco Social History   Tobacco Use  Smoking Status Former Smoker  . Packs/day: 0.25  . Years: 8.00  . Pack years: 2.00  . Types: Cigarettes  . Last attempt to quit: 01/24/1962  . Years since quitting: 55.2  Smokeless Tobacco Never Used     Counseling given: Not Answered  Past Medical History:  Diagnosis Date  . Bladder outlet obstruction   . BPH (benign prostatic hyperplasia)   . Conjunctivitis, acute, bilateral    07-02-2015  per pcp note and  started antibiotic drops  . First degree heart block   . History of acute conjunctivitis    07-02-2015  resolved after round of antibiotic eye drops  . History of gout    BIG TOE  . History of urinary retention 12/2014  . Hx of adenomatous colonic polyps 07/07/2016  . Hyperlipidemia   . Hypertension   . Lower urinary tract symptoms (LUTS)   . OSA on CPAP    MODERATE PER STUDY 03-08-2012  . Osteoarthritis   . PAC (premature atrial contraction)   . 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 S/P radiation therapy 08-27-2014  to  10-22-2014   prostate 7800Gy in 40 sessions and seminal vesicals 5000Gy in 40 sessions  . Wears glasses    Past Surgical History:  Procedure Laterality Date  . APPENDECTOMY  1966  . COLONOSCOPY  07-06-2006  . GOLD SEED IMPLANT N/A 08/12/2014   Procedure: GOLD SEED IMPLANT;  Surgeon: Lowella Bandy, MD;  [facility];  Service: Urology;  Laterality: N/A;  . GREEN LIGHT LASER TURP (TRANSURETHRAL RESECTION OF PROSTATE N/A 07/20/2015   Procedure: GREEN LIGHT LASER ABLATION OF PROSTATE ;  Surgeon:  Nickie Retort, MD;  Location: [facility];  Service: Urology;  Laterality: N/A;  . PROSTATE BIOPSY N/A 03/28/2014   Procedure: BIOPSY TRANSRECTAL ULTRASONIC PROSTATE (TUBP);  Surgeon: Arvil Persons, MD;  Location: [facility];  Service: Urology;  Laterality: N/A;  . TONSILLECTOMY  as child  . TOTAL KNEE ARTHROPLASTY Right 06-22-2007   Family History  Problem Relation Age of Onset  . Diabetes Father   . Cancer Brother        pancreatic, stomach  . Colon cancer Neg Hx    Social History   Socioeconomic History  . Marital status: Married    Spouse name: Not on file  . Number of children: 4  . Years of education: Not on file  . Highest education level: Not on  file  Occupational History  . Occupation: retired  Scientific laboratory technician  . Financial resource strain: Not hard at all  . Food insecurity:    Worry: Never true    Inability: Never true  . Transportation needs:    Medical: No    Non-medical: No  Tobacco Use  . Smoking status: Former Smoker    Packs/day: 0.25    Years: 8.00    Pack years: 2.00    Types: Cigarettes    Last attempt to quit: 01/24/1962    Years since quitting: 55.2  . Smokeless tobacco: Never Used  Substance and Sexual Activity  . Alcohol use: No  . Drug use: No  . Sexual activity: Not Currently    Partners: Female  Lifestyle  . Physical activity:    Days per week: 5 days    Minutes per session: 40 min  . Stress: Not at all  Relationships  . Social connections:    Talks on phone: More than three times a week    Gets together: More than three times a week    Attends religious service: More than 4 times per year    Active member of club or organization: Yes    Attends meetings of clubs or organizations: More than 4 times per year    Relationship status: Married  Other Topics Concern  . 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 acetaminophen (TYLENOL) 325 MG tablet Take 2 tablets (650 mg total) by mouth every 8 (eight) hours.  Marland Kitchen amLODipine (NORVASC) 5 MG tablet TAKE ONE TABLET BY MOUTH ONCE DAILY  . cloNIDine (CATAPRES) 0.3 MG tablet TAKE 1 TABLET BY MOUTH TWICE DAILY  . diazepam (VALIUM) 5 MG tablet TAKE ONE TABLET BY MOUTH EVERY 12 HOURS AS NEEDED FOR ANXIETY OR  MUSCLE  SPASMS  . EQ ALLERGY RELIEF 10 MG tablet TAKE ONE TABLET BY MOUTH ONCE DAILY  . fluticasone (FLONASE) 50 MCG/ACT nasal spray Place 2 sprays into both nostrils daily. (Patient taking differently: Place 2 sprays into both nostrils daily as needed  for allergies. )  . furosemide (LASIX) 40 MG tablet TAKE 1 TABLET BY MOUTH ONCE DAILY AS NEEDED FOR  SWELLING  . HYDROcodone-acetaminophen (NORCO) 7.5-325 MG tablet Take 1 tablet by mouth 4 (four) times daily as needed for moderate pain.  . indomethacin (INDOCIN) 50 MG capsule Take 1 capsule (50 mg total) by mouth 3 (three) times daily as needed. For gout  attack  . losartan (COZAAR) 100 MG tablet TAKE 1 TABLET BY MOUTH ONCE DAILY  . lovastatin (MEVACOR) 40 MG tablet TAKE 1 TABLET BY MOUTH ONCE DAILY AT 6PM  . methocarbamol (ROBAXIN) 500 MG tablet Take 1 tablet (500 mg total) by mouth 2 (two) times daily.  . methylPREDNISolone (MEDROL DOSEPAK) 4 MG TBPK tablet As directed for gout  . potassium chloride (KLOR-CON) 8 MEQ tablet Take 1 tablet (8 mEq total) by mouth daily.  . sildenafil (VIAGRA) 100 MG tablet Take 1 tablet (100 mg total) by mouth as needed for erectile dysfunction.  . tamsulosin (FLOMAX) 0.4 MG CAPS capsule Take 1 capsule (0.4 mg total) by mouth daily. (Patient taking differently: Take 0.4 mg by mouth 2 (two) times daily. )  . [DISCONTINUED] ibuprofen (ADVIL,MOTRIN) 600 MG tablet Take 1 tablet (600 mg total) by mouth every 6 (six) hours as needed. (Patient taking differently: Take 600 mg by mouth every 6 (six) hours as needed for moderate pain. )  . [DISCONTINUED] potassium chloride (KLOR-CON) 8 MEQ tablet Take 1 tablet (8 mEq total) by mouth 2 (two) times daily.  . [DISCONTINUED] potassium chloride (KLOR-CON) 8 MEQ tablet TAKE 1 TABLET BY MOUTH ONCE DAILY   Facility-Administered Encounter Medications as of 04/25/2017  Medication  . 0.9 %  sodium chloride infusion    Activities of Daily Living In your present state of health, do you have any difficulty performing the following activities: 04/25/2017  Hearing? Y  Vision? N  Difficulty concentrating or making decisions? N  Walking or climbing stairs? N  Dressing or bathing? N  Doing errands, shopping? N  Preparing Food and eating ?  N  Using the Toilet? N  In the past six months, have you accidently leaked urine? N  Comment followed by urology  Do you have problems with loss of bowel control? N  Managing your Medications? N  Managing your Finances? N  Housekeeping or managing your Housekeeping? N  Some recent data might be hidden    Patient Care Team: Plotnikov, Evie Lacks, MD as PCP - General (Internal Medicine) Earlie Server, MD (Orthopedic Surgery) Nickie Retort, MD as Consulting Physician (Urology) Chesley Mires, MD as Consulting Physician (Pulmonary Disease)   Assessment:   This is a routine wellness examination for [place]. Physical assessment deferred to PCP.   Exercise Activities and Dietary recommendations Current Exercise Habits: Home exercise routine, Type of exercise: walking, Time (Minutes): 30, Frequency (Times/Week): 5, Weekly Exercise (Minutes/Week): 150, Exercise limited by: None identified  Diet (meal preparation, eat out, water intake, caffeinated beverages, dairy products, fruits and vegetables): in general, a "healthy" diet  , well balanced   Reviewed heart healthy diet, encouraged patient to increase daily water intake.    Goals    . Patient Stated     Continue to walk daily, eat healthy, enjoy life, family and stay independent    . Weight (lb) < 219 lb (99.3 kg)     Start to walk at the [facility] once a week. Increase water. Eat low salt, low cholesterol diet.        Fall Risk Fall Risk  04/25/2017 04/25/2017 03/21/2016 01/11/2016 11/25/2014  Falls in the past year? No No No No No   Depression Screen PHQ 2/9 Scores 04/25/2017 04/25/2017 03/21/2016 01/11/2016  PHQ - 2 Score 0 0 1 0  PHQ- 9 Score 1 - - -    Cognitive Function MMSE - Mini Mental State Exam 04/25/2017 03/21/2016  Orientation to time 5 5  Orientation to Place 5 5  Registration 3 3  Attention/ Calculation 5 5  Recall 1 1  Language- name 2 objects 2 2  Language- repeat 1 1  Language- follow 3 step command 3 3    Language- read & follow direction 1 1  Write a sentence 1 1  Copy design 1 1  Total score 28 28        Immunization History  Administered Date(s) Administered  . Influenza Split 11/25/2010, 11/03/2011  . Influenza Whole 10/24/2007, 10/30/2008, 09/24/2009  . Influenza, High Dose Seasonal PF 09/22/2015, 10/18/2016  . Influenza,inj,Quad PF,6+ Mos 10/17/2012, 10/11/2013, 10/09/2014  . PPD Test 10/29/2013, 10/29/2013  . Pneumococcal Conjugate-13 02/20/2013  . Pneumococcal Polysaccharide-23 10/30/2008, 10/12/2015  . Td 10/30/2008  . Tdap 11/25/2010  . Zoster 03/13/2013   Screening Tests Health Maintenance  Topic Date Due  . INFLUENZA VACCINE  08/24/2017  . TETANUS/TDAP  11/24/2020  . COLONOSCOPY  06/28/2021  . PNA vac Low Risk Adult  Completed      Plan:    Continue doing brain stimulating activities (puzzles, reading, adult coloring books, staying active) to keep memory sharp.   Continue to eat heart healthy diet (full of fruits, vegetables, whole grains, lean protein, water--limit salt, fat, and sugar intake) and increase physical activity as tolerated.  I have personally reviewed and noted the following in the patient's chart:   . Medical and social history . Use of alcohol, tobacco or illicit drugs  . Current medications and supplements . Functional ability and status . Nutritional status . Physical activity . Advanced directives . List of other physicians . Vitals . Screenings to include cognitive, depression, and falls . Referrals and appointments  In addition, I have reviewed and discussed with patient certain preventive protocols, quality metrics, and best practice recommendations. A written personalized care plan for preventive services as well as general preventive health recommendations were provided to patient.     Michiel Cowboy, RN  04/25/2017  Medical screening examination/treatment/procedure(s) were performed by non-physician practitioner and as  supervising physician I was immediately available for consultation/collaboration. I agree with above. Lew Dawes, MD

## 2017-04-25 NOTE — Assessment & Plan Note (Signed)
Will irrigate 

## 2017-04-25 NOTE — Assessment & Plan Note (Signed)
Labs

## 2017-04-25 NOTE — Assessment & Plan Note (Signed)
On KCl 

## 2017-04-26 ENCOUNTER — Other Ambulatory Visit (INDEPENDENT_AMBULATORY_CARE_PROVIDER_SITE_OTHER): Payer: Medicare Other

## 2017-04-26 DIAGNOSIS — I10 Essential (primary) hypertension: Secondary | ICD-10-CM

## 2017-04-26 DIAGNOSIS — E876 Hypokalemia: Secondary | ICD-10-CM

## 2017-04-26 DIAGNOSIS — R1011 Right upper quadrant pain: Secondary | ICD-10-CM | POA: Diagnosis not present

## 2017-04-26 DIAGNOSIS — E785 Hyperlipidemia, unspecified: Secondary | ICD-10-CM | POA: Diagnosis not present

## 2017-04-26 LAB — URINALYSIS, ROUTINE W REFLEX MICROSCOPIC
BILIRUBIN URINE: NEGATIVE
Ketones, ur: NEGATIVE
Nitrite: NEGATIVE
PH: 6.5 (ref 5.0–8.0)
Specific Gravity, Urine: 1.01 (ref 1.000–1.030)
URINE GLUCOSE: NEGATIVE
UROBILINOGEN UA: 0.2 (ref 0.0–1.0)

## 2017-04-26 LAB — LIPID PANEL
CHOL/HDL RATIO: 3
Cholesterol: 139 mg/dL (ref 0–200)
HDL: 47 mg/dL (ref >39.00)
LDL CALC: 78 mg/dL (ref 0–99)
NONHDL: 92.06
Triglycerides: 69 mg/dL (ref 0.0–149.0)
VLDL: 13.8 mg/dL (ref 0.0–40.0)

## 2017-04-26 LAB — CBC WITH DIFFERENTIAL/PLATELET
BASOS ABS: 0 10*3/uL (ref 0.0–0.1)
Basophils Relative: 0.5 % (ref 0.0–3.0)
EOS ABS: 0.2 10*3/uL (ref 0.0–0.7)
Eosinophils Relative: 3.9 % (ref 0.0–5.0)
HCT: 40.5 % (ref 39.0–52.0)
Hemoglobin: 13.7 g/dL (ref 13.0–17.0)
Lymphocytes Relative: 39 % (ref 12.0–46.0)
Lymphs Abs: 1.7 10*3/uL (ref 0.7–4.0)
MCHC: 33.7 g/dL (ref 30.0–36.0)
MCV: 83.1 fl (ref 78.0–100.0)
MONO ABS: 0.6 10*3/uL (ref 0.1–1.0)
Monocytes Relative: 14.1 % — ABNORMAL HIGH (ref 3.0–12.0)
NEUTROS PCT: 42.5 % — AB (ref 43.0–77.0)
Neutro Abs: 1.9 10*3/uL (ref 1.4–7.7)
Platelets: 204 10*3/uL (ref 150.0–400.0)
RBC: 4.87 Mil/uL (ref 4.22–5.81)
RDW: 13.7 % (ref 11.5–15.5)
WBC: 4.4 10*3/uL (ref 4.0–10.5)

## 2017-04-26 LAB — BASIC METABOLIC PANEL
BUN: 12 mg/dL (ref 6–23)
CALCIUM: 9 mg/dL (ref 8.4–10.5)
CO2: 28 meq/L (ref 19–32)
Chloride: 102 mEq/L (ref 96–112)
Creatinine, Ser: 1.06 mg/dL (ref 0.40–1.50)
GFR: 87.6 mL/min (ref >60.00)
GLUCOSE: 127 mg/dL — AB (ref 70–99)
Potassium: 3.4 mEq/L — ABNORMAL LOW (ref 3.5–5.1)
SODIUM: 139 meq/L (ref 135–145)

## 2017-04-26 LAB — HEPATIC FUNCTION PANEL
ALT: 15 U/L (ref 0–53)
AST: 15 U/L (ref 0–37)
Albumin: 3.8 g/dL (ref 3.5–5.2)
Alkaline Phosphatase: 94 U/L (ref 39–117)
BILIRUBIN TOTAL: 0.6 mg/dL (ref 0.2–1.2)
Bilirubin, Direct: 0.1 mg/dL (ref 0.0–0.3)
Total Protein: 6.9 g/dL (ref 6.0–8.3)

## 2017-04-26 LAB — TSH: TSH: 1.69 u[IU]/mL (ref 0.35–4.50)

## 2017-05-08 ENCOUNTER — Ambulatory Visit: Payer: Medicare Other | Admitting: Pulmonary Disease

## 2017-05-08 ENCOUNTER — Encounter: Payer: Self-pay | Admitting: Pulmonary Disease

## 2017-05-08 VITALS — BP 140/80 | HR 90 | Ht 69.0 in | Wt 243.6 lb

## 2017-05-08 DIAGNOSIS — G4733 Obstructive sleep apnea (adult) (pediatric): Secondary | ICD-10-CM | POA: Diagnosis not present

## 2017-05-08 DIAGNOSIS — Z9989 Dependence on other enabling machines and devices: Secondary | ICD-10-CM

## 2017-05-08 NOTE — Patient Instructions (Signed)
Follow up in 1 year.

## 2017-05-08 NOTE — Progress Notes (Signed)
[facility]  Chief Complaint  Patient presents with  . Follow-up    Pt doing well overall with cpap machine    Vital signs: BP 140/80 (BP Location: Left Arm, Cuff Size: Normal)   Pulse 90   Ht 5\' 9"  (1.753 m)   Wt 243 lb 9.6 oz (110.5 kg)   SpO2 97%   BMI 35.97 kg/m   History of Present Illness: Jeffrey Carter is a 77 y.o. male with obstructive sleep apnea.  He has been doing well with CPAP.  Has full face mask.  No issue with mask fit.  Gets about 8 hrs sleep per night.  Takes naps during the day, and uses CPAP then.  Physical Exam:  General - pleasant Eyes - pupils reactive ENT - no sinus tenderness, no oral exudate, no LAN, poor dentition Cardiac - regular, no murmur Chest - no wheeze, rales Abd - soft, non tender Ext - no edema Skin - no rashes Neuro - normal strength Psych - normal mood  Assessment/Plan:  Obstructive sleep apnea. - he is compliant with CPAP and reports benefit - he will brings his SD card and will call him after reviewing report - explained that he could be eligible for a new machine >> he wants to continue with his current machine   Patient Instructions  Follow up in 1 year    Chesley Mires, MD [place] 05/08/2017, 4:17 PM  Flow Sheet  Sleep tests: PSG 02/29/12 >> AHI 32, SpO2 low 79%  Past Medical History: He  has a past medical history of Bladder outlet obstruction, BPH (benign prostatic hyperplasia), Conjunctivitis, acute, bilateral, First degree heart block, History of acute conjunctivitis, History of gout, History of urinary retention (12/2014), adenomatous colonic polyps (07/07/2016), Hyperlipidemia, Hypertension, Lower urinary tract symptoms (LUTS), OSA on CPAP, Osteoarthritis, PAC (premature atrial contraction), Prostate cancer [facility]) (urologist-  dr Pilar Jarvis  oncologist-  dr Ander Slade), S/P radiation therapy (08-27-2014  to  10-22-2014), and Wears glasses.  Past Surgical  History: He  has a past surgical history that includes Total knee arthroplasty (Right, 06-22-2007); Colonoscopy (07-06-2006); Tonsillectomy (as child); Appendectomy (1966); Prostate biopsy (N/A, 03/28/2014); Gold seed implant (N/A, 08/12/2014); and Green light laser turp (transurethral resection of prostate (N/A, 07/20/2015).  Family History: His family history includes Cancer in his brother; Diabetes in his father.  Social History: He  reports that he quit smoking about 55 years ago. His smoking use included cigarettes. He has a 2.00 pack-year smoking history. He has never used smokeless tobacco. He reports that he does not drink alcohol or use drugs.  Medications: Allergies as of 05/08/2017      Reactions   Percocet [oxycodone-acetaminophen]    nausea      Medication List        Accurate as of 05/08/17  4:17 PM. Always use your most recent med list.          acetaminophen 325 MG tablet Commonly known as:  TYLENOL Take 2 tablets (650 mg total) by mouth every 8 (eight) hours.   amLODipine 5 MG tablet Commonly known as:  NORVASC TAKE ONE TABLET BY MOUTH ONCE DAILY   cloNIDine 0.3 MG tablet Commonly known as:  CATAPRES TAKE 1 TABLET BY MOUTH TWICE DAILY   diazepam 5 MG tablet Commonly known as:  VALIUM TAKE ONE TABLET BY MOUTH EVERY 12 HOURS AS NEEDED FOR ANXIETY OR  MUSCLE  SPASMS   EQ ALLERGY RELIEF 10 MG tablet Generic drug:  loratadine TAKE ONE TABLET BY MOUTH ONCE DAILY   fluticasone 50 MCG/ACT nasal spray Commonly known as:  FLONASE Place 2 sprays into both nostrils daily.   furosemide 40 MG tablet Commonly known as:  LASIX TAKE 1 TABLET BY MOUTH ONCE DAILY AS NEEDED FOR  SWELLING   HYDROcodone-acetaminophen 7.5-325 MG tablet Commonly known as:  NORCO Take 1 tablet by mouth 4 (four) times daily as needed for moderate pain.   indomethacin 50 MG capsule Commonly known as:  INDOCIN Take 1 capsule (50 mg total) by mouth 3 (three) times daily as needed. For gout  attack   losartan 100 MG tablet Commonly known as:  COZAAR TAKE 1 TABLET BY MOUTH ONCE DAILY   lovastatin 40 MG tablet Commonly known as:  MEVACOR TAKE 1 TABLET BY MOUTH ONCE DAILY AT 6PM   methocarbamol 500 MG tablet Commonly known as:  ROBAXIN Take 1 tablet (500 mg total) by mouth 2 (two) times daily.   methylPREDNISolone 4 MG Tbpk tablet Commonly known as:  MEDROL DOSEPAK As directed for gout   potassium chloride 8 MEQ tablet Commonly known as:  KLOR-CON Take 1 tablet (8 mEq total) by mouth daily.   sildenafil 100 MG tablet Commonly known as:  VIAGRA Take 1 tablet (100 mg total) by mouth as needed for erectile dysfunction.   tamsulosin 0.4 MG Caps capsule Commonly known as:  FLOMAX Take 1 capsule (0.4 mg total) by mouth daily.

## 2017-05-09 ENCOUNTER — Telehealth: Payer: Self-pay | Admitting: Pulmonary Disease

## 2017-05-09 NOTE — Telephone Encounter (Signed)
Patient was needing to know where his SD card was in his machine. I looked in machine and there was no card and patient states that he was not given one. I advised patient that he needed to contact his DME company and let them know that he does not have a card. Patient was going to [place] to get this taken care of.

## 2017-05-15 ENCOUNTER — Telehealth: Payer: Self-pay | Admitting: Pulmonary Disease

## 2017-05-15 NOTE — Telephone Encounter (Signed)
Attempted to call pt but no answer and VM box is full so unable to leave message.  Will try to call back later.

## 2017-05-15 NOTE — Telephone Encounter (Signed)
Spoke with pt. He is aware of results. Nothing further was needed. 

## 2017-05-15 NOTE — Telephone Encounter (Signed)
CPAP 02/08/17 to 05/08/17 >> used on 90 of 90 nights with average 7 hrs 14 min.  Average AHI 3.4 with CPAP 12 cm H2O.   Please let him know CPAP report shows good control of sleep apnea with current settings.

## 2017-05-16 DIAGNOSIS — G4733 Obstructive sleep apnea (adult) (pediatric): Secondary | ICD-10-CM | POA: Diagnosis not present

## 2017-06-20 DIAGNOSIS — G4733 Obstructive sleep apnea (adult) (pediatric): Secondary | ICD-10-CM | POA: Diagnosis not present

## 2017-07-07 ENCOUNTER — Other Ambulatory Visit: Payer: Self-pay | Admitting: Internal Medicine

## 2017-07-12 ENCOUNTER — Other Ambulatory Visit: Payer: Self-pay | Admitting: Internal Medicine

## 2017-07-25 ENCOUNTER — Ambulatory Visit (INDEPENDENT_AMBULATORY_CARE_PROVIDER_SITE_OTHER): Payer: Medicare Other | Admitting: Internal Medicine

## 2017-07-25 ENCOUNTER — Other Ambulatory Visit (INDEPENDENT_AMBULATORY_CARE_PROVIDER_SITE_OTHER): Payer: Medicare Other

## 2017-07-25 ENCOUNTER — Encounter: Payer: Self-pay | Admitting: Internal Medicine

## 2017-07-25 DIAGNOSIS — E876 Hypokalemia: Secondary | ICD-10-CM

## 2017-07-25 DIAGNOSIS — M79604 Pain in right leg: Secondary | ICD-10-CM

## 2017-07-25 DIAGNOSIS — C61 Malignant neoplasm of prostate: Secondary | ICD-10-CM | POA: Diagnosis not present

## 2017-07-25 DIAGNOSIS — M545 Low back pain, unspecified: Secondary | ICD-10-CM

## 2017-07-25 DIAGNOSIS — I10 Essential (primary) hypertension: Secondary | ICD-10-CM

## 2017-07-25 DIAGNOSIS — R600 Localized edema: Secondary | ICD-10-CM

## 2017-07-25 LAB — BASIC METABOLIC PANEL
BUN: 13 mg/dL (ref 6–23)
CALCIUM: 9 mg/dL (ref 8.4–10.5)
CO2: 29 mEq/L (ref 19–32)
Chloride: 105 mEq/L (ref 96–112)
Creatinine, Ser: 1.15 mg/dL (ref 0.40–1.50)
GFR: 79.69 mL/min (ref >60.00)
GLUCOSE: 109 mg/dL — AB (ref 70–99)
POTASSIUM: 3.6 meq/L (ref 3.5–5.1)
SODIUM: 142 meq/L (ref 135–145)

## 2017-07-25 NOTE — Assessment & Plan Note (Signed)
Resolved

## 2017-07-25 NOTE — Assessment & Plan Note (Signed)
No relapse 

## 2017-07-25 NOTE — Assessment & Plan Note (Signed)
BMET 

## 2017-07-25 NOTE — Assessment & Plan Note (Signed)
Amlodipine, Catapress, Furosemide, Losartan °

## 2017-07-25 NOTE — Progress Notes (Signed)
Subjective:  Patient ID: Jeffrey Carter, male    DOB: 1940-11-03  Age: 77 y.o. MRN: [id number]  CC: No chief complaint on file.   HPI Jeffrey Carter presents for HTN, prostate ca, LBP f/u  Outpatient Medications Prior to Visit  Medication Sig Dispense Refill  . acetaminophen (TYLENOL) 325 MG tablet Take 2 tablets (650 mg total) by mouth every 8 (eight) hours. 30 tablet 0  . amLODipine (NORVASC) 5 MG tablet TAKE 1 TABLET BY MOUTH ONCE DAILY 90 tablet 1  . cloNIDine (CATAPRES) 0.3 MG tablet TAKE 1 TABLET BY MOUTH TWICE DAILY 180 tablet 3  . diazepam (VALIUM) 5 MG tablet TAKE ONE TABLET BY MOUTH EVERY 12 HOURS AS NEEDED FOR ANXIETY OR  MUSCLE  SPASMS 30 tablet 2  . EQ ALLERGY RELIEF 10 MG tablet TAKE ONE TABLET BY MOUTH ONCE DAILY 90 tablet 3  . fluticasone (FLONASE) 50 MCG/ACT nasal spray Place 2 sprays into both nostrils daily. (Patient taking differently: Place 2 sprays into both nostrils daily as needed for allergies. ) 16 g 2  . furosemide (LASIX) 40 MG tablet TAKE 1 TABLET BY MOUTH ONCE DAILY AS NEEDED FOR  SWELLING 90 tablet 3  . HYDROcodone-acetaminophen (NORCO) 7.5-325 MG tablet Take 1 tablet by mouth 4 (four) times daily as needed for moderate pain. 30 tablet 0  . indomethacin (INDOCIN) 50 MG capsule Take 1 capsule (50 mg total) by mouth 3 (three) times daily as needed. For gout attack 60 capsule 1  . losartan (COZAAR) 100 MG tablet TAKE 1 TABLET BY MOUTH ONCE DAILY 90 tablet 1  . lovastatin (MEVACOR) 40 MG tablet TAKE 1 TABLET BY MOUTH ONCE DAILY AT 6PM 90 tablet 3  . methocarbamol (ROBAXIN) 500 MG tablet Take 1 tablet (500 mg total) by mouth 2 (two) times daily. 20 tablet 0  . methylPREDNISolone (MEDROL DOSEPAK) 4 MG TBPK tablet As directed for gout 21 tablet 0  . potassium chloride (KLOR-CON) 8 MEQ tablet Take 1 tablet (8 mEq total) by mouth daily. 30 tablet 11  . sildenafil (VIAGRA) 100 MG tablet Take 1 tablet (100 mg total) by mouth as needed for erectile dysfunction. 12  tablet 5  . tamsulosin (FLOMAX) 0.4 MG CAPS capsule Take 1 capsule (0.4 mg total) by mouth daily. (Patient taking differently: Take 0.4 mg by mouth 2 (two) times daily. ) 30 capsule 3   Facility-Administered Medications Prior to Visit  Medication Dose Route Frequency Provider Last Rate Last Dose  . 0.9 %  sodium chloride infusion  500 mL Intravenous Continuous Gatha Mayer, MD        ROS: Review of Systems  Constitutional: Negative for appetite change, fatigue and unexpected weight change.  HENT: Negative for congestion, nosebleeds, sneezing, sore throat and trouble swallowing.   Eyes: Negative for itching and visual disturbance.  Respiratory: Negative for cough.   Cardiovascular: Negative for chest pain, palpitations and leg swelling.  Gastrointestinal: Negative for abdominal distention, blood in stool, diarrhea and nausea.  Genitourinary: Negative for frequency and hematuria.  Musculoskeletal: Negative for back pain, gait problem, joint swelling and neck pain.  Skin: Negative for rash.  Neurological: Negative for dizziness, tremors, speech difficulty and weakness.  Psychiatric/Behavioral: Negative for agitation, dysphoric mood, sleep disturbance and suicidal ideas. The patient is not nervous/anxious.     Objective:  BP (!) 162/90 (BP Location: Left Arm, Patient Position: Sitting, Cuff Size: Large)   Pulse 89   Ht 5\' 9"  (1.753 m)   Wt 248  lb (112.5 kg)   SpO2 96%   BMI 36.62 kg/m   BP Readings from Last 3 Encounters:  07/25/17 (!) 162/90  05/08/17 140/80  04/25/17 (!) 154/86    Wt Readings from Last 3 Encounters:  07/25/17 248 lb (112.5 kg)  05/08/17 243 lb 9.6 oz (110.5 kg)  04/25/17 242 lb (109.8 kg)    Physical Exam  Constitutional: He is oriented to person, place, and time. He appears well-developed. No distress.  NAD  HENT:  Mouth/Throat: Oropharynx is clear and moist.  Eyes: Pupils are equal, round, and reactive to light. Conjunctivae are normal.  Neck:  Normal range of motion. No JVD present. No thyromegaly present.  Cardiovascular: Normal rate, regular rhythm, normal heart sounds and intact distal pulses. Exam reveals no gallop and no friction rub.  No murmur heard. Pulmonary/Chest: Effort normal and breath sounds normal. No respiratory distress. He has no wheezes. He has no rales. He exhibits no tenderness.  Abdominal: Soft. Bowel sounds are normal. He exhibits no distension and no mass. There is no tenderness. There is no rebound and no guarding.  Musculoskeletal: Normal range of motion. He exhibits no edema or tenderness.  Lymphadenopathy:    He has no cervical adenopathy.  Neurological: He is alert and oriented to person, place, and time. He has normal reflexes. No cranial nerve deficit. He exhibits normal muscle tone. He displays a negative Romberg sign. Coordination and gait normal.  Skin: Skin is warm and dry. No rash noted.  Psychiatric: He has a normal mood and affect. His behavior is normal. Judgment and thought content normal.    Lab Results  Component Value Date   WBC 4.4 04/26/2017   HGB 13.7 04/26/2017   HCT 40.5 04/26/2017   PLT 204.0 04/26/2017   GLUCOSE 127 (H) 04/26/2017   CHOL 139 04/26/2017   TRIG 69.0 04/26/2017   HDL 47.00 04/26/2017   LDLCALC 78 04/26/2017   ALT 15 04/26/2017   AST 15 04/26/2017   NA 139 04/26/2017   K 3.4 (L) 04/26/2017   CL 102 04/26/2017   CREATININE 1.06 04/26/2017   BUN 12 04/26/2017   CO2 28 04/26/2017   TSH 1.69 04/26/2017   PSA 5.25 (H) 12/11/2013   INR 1.01 10/12/2014    No results found.  Assessment & Plan:   There are no diagnoses linked to this encounter.   No orders of the defined types were placed in this encounter.    Follow-up: No follow-ups on file.  [name], MD

## 2017-07-25 NOTE — Assessment & Plan Note (Signed)
Lupron inj

## 2017-08-14 DIAGNOSIS — G4733 Obstructive sleep apnea (adult) (pediatric): Secondary | ICD-10-CM | POA: Diagnosis not present

## 2017-08-15 DIAGNOSIS — H35363 Drusen (degenerative) of macula, bilateral: Secondary | ICD-10-CM | POA: Diagnosis not present

## 2017-08-15 DIAGNOSIS — Z961 Presence of intraocular lens: Secondary | ICD-10-CM | POA: Diagnosis not present

## 2017-08-21 ENCOUNTER — Other Ambulatory Visit: Payer: Self-pay | Admitting: *Deleted

## 2017-08-21 MED ORDER — AMLODIPINE BESYLATE 5 MG PO TABS: 5.0000 mg | ORAL_TABLET | Freq: Every day | ORAL | 1 refills | Status: DC

## 2017-08-21 MED ORDER — CLONIDINE HCL 0.3 MG PO TABS: 0.3000 mg | ORAL_TABLET | Freq: Two times a day (BID) | ORAL | 1 refills | Status: DC

## 2017-08-21 MED ORDER — FUROSEMIDE 40 MG PO TABS: 40.0000 mg | ORAL_TABLET | Freq: Every day | ORAL | 1 refills | Status: DC

## 2017-08-21 MED ORDER — POTASSIUM CHLORIDE ER 8 MEQ PO TBCR: 8.0000 meq | EXTENDED_RELEASE_TABLET | Freq: Every day | ORAL | 1 refills | Status: DC

## 2017-08-21 MED ORDER — TAMSULOSIN HCL 0.4 MG PO CAPS: 0.4000 mg | ORAL_CAPSULE | Freq: Every day | ORAL | 1 refills | Status: DC

## 2017-08-21 MED ORDER — LOVASTATIN 40 MG PO TABS: ORAL_TABLET | ORAL | 1 refills | Status: DC

## 2017-09-20 DIAGNOSIS — G4733 Obstructive sleep apnea (adult) (pediatric): Secondary | ICD-10-CM | POA: Diagnosis not present

## 2017-10-05 DIAGNOSIS — R350 Frequency of micturition: Secondary | ICD-10-CM | POA: Diagnosis not present

## 2017-10-05 DIAGNOSIS — N35011 Post-traumatic bulbous urethral stricture: Secondary | ICD-10-CM | POA: Diagnosis not present

## 2017-10-17 ENCOUNTER — Ambulatory Visit (INDEPENDENT_AMBULATORY_CARE_PROVIDER_SITE_OTHER): Payer: Medicare Other

## 2017-10-17 DIAGNOSIS — Z23 Encounter for immunization: Secondary | ICD-10-CM

## 2017-11-13 DIAGNOSIS — G4733 Obstructive sleep apnea (adult) (pediatric): Secondary | ICD-10-CM | POA: Diagnosis not present

## 2017-11-22 ENCOUNTER — Other Ambulatory Visit: Payer: Self-pay | Admitting: Internal Medicine

## 2017-11-29 ENCOUNTER — Ambulatory Visit (INDEPENDENT_AMBULATORY_CARE_PROVIDER_SITE_OTHER): Payer: Medicare Other | Admitting: Internal Medicine

## 2017-11-29 ENCOUNTER — Other Ambulatory Visit (INDEPENDENT_AMBULATORY_CARE_PROVIDER_SITE_OTHER): Payer: Medicare Other

## 2017-11-29 ENCOUNTER — Encounter: Payer: Self-pay | Admitting: Internal Medicine

## 2017-11-29 DIAGNOSIS — I10 Essential (primary) hypertension: Secondary | ICD-10-CM

## 2017-11-29 DIAGNOSIS — M545 Low back pain, unspecified: Secondary | ICD-10-CM

## 2017-11-29 DIAGNOSIS — E876 Hypokalemia: Secondary | ICD-10-CM

## 2017-11-29 DIAGNOSIS — C61 Malignant neoplasm of prostate: Secondary | ICD-10-CM | POA: Diagnosis not present

## 2017-11-29 DIAGNOSIS — E785 Hyperlipidemia, unspecified: Secondary | ICD-10-CM | POA: Diagnosis not present

## 2017-11-29 DIAGNOSIS — M79604 Pain in right leg: Secondary | ICD-10-CM

## 2017-11-29 LAB — TSH: TSH: 1.7 u[IU]/mL (ref 0.35–4.50)

## 2017-11-29 LAB — CBC WITH DIFFERENTIAL/PLATELET
BASOS ABS: 0 10*3/uL (ref 0.0–0.1)
Basophils Relative: 0.5 % (ref 0.0–3.0)
EOS ABS: 0.2 10*3/uL (ref 0.0–0.7)
Eosinophils Relative: 3.6 % (ref 0.0–5.0)
HCT: 38.3 % — ABNORMAL LOW (ref 39.0–52.0)
HEMOGLOBIN: 13 g/dL (ref 13.0–17.0)
LYMPHS PCT: 38.5 % (ref 12.0–46.0)
Lymphs Abs: 1.7 10*3/uL (ref 0.7–4.0)
MCHC: 33.9 g/dL (ref 30.0–36.0)
MCV: 82.6 fl (ref 78.0–100.0)
MONO ABS: 0.6 10*3/uL (ref 0.1–1.0)
Monocytes Relative: 12.3 % — ABNORMAL HIGH (ref 3.0–12.0)
Neutro Abs: 2 10*3/uL (ref 1.4–7.7)
Neutrophils Relative %: 45.1 % (ref 43.0–77.0)
PLATELETS: 199 10*3/uL (ref 150.0–400.0)
RBC: 4.63 Mil/uL (ref 4.22–5.81)
RDW: 14.2 % (ref 11.5–15.5)
WBC: 4.5 10*3/uL (ref 4.0–10.5)

## 2017-11-29 LAB — URINALYSIS, ROUTINE W REFLEX MICROSCOPIC
Bilirubin Urine: NEGATIVE
Hgb urine dipstick: NEGATIVE
KETONES UR: NEGATIVE
Nitrite: NEGATIVE
TOTAL PROTEIN, URINE-UPE24: NEGATIVE
URINE GLUCOSE: NEGATIVE
UROBILINOGEN UA: 0.2 (ref 0.0–1.0)
pH: 6 (ref 5.0–8.0)

## 2017-11-29 LAB — BASIC METABOLIC PANEL
BUN: 12 mg/dL (ref 6–23)
CO2: 30 meq/L (ref 19–32)
CREATININE: 1.19 mg/dL (ref 0.40–1.50)
Calcium: 8.9 mg/dL (ref 8.4–10.5)
Chloride: 104 mEq/L (ref 96–112)
GFR: 76.53 mL/min (ref >60.00)
GLUCOSE: 102 mg/dL — AB (ref 70–99)
POTASSIUM: 3.7 meq/L (ref 3.5–5.1)
Sodium: 139 mEq/L (ref 135–145)

## 2017-11-29 LAB — LIPID PANEL
CHOL/HDL RATIO: 4
CHOLESTEROL: 136 mg/dL (ref 0–200)
HDL: 38.3 mg/dL — ABNORMAL LOW (ref >39.00)
LDL CALC: 69 mg/dL (ref 0–99)
NonHDL: 97.83
Triglycerides: 145 mg/dL (ref 0.0–149.0)
VLDL: 29 mg/dL (ref 0.0–40.0)

## 2017-11-29 LAB — HEPATIC FUNCTION PANEL
ALK PHOS: 82 U/L (ref 39–117)
ALT: 17 U/L (ref 0–53)
AST: 13 U/L (ref 0–37)
Albumin: 3.9 g/dL (ref 3.5–5.2)
BILIRUBIN TOTAL: 0.4 mg/dL (ref 0.2–1.2)
Bilirubin, Direct: 0.1 mg/dL (ref 0.0–0.3)
Total Protein: 6.6 g/dL (ref 6.0–8.3)

## 2017-11-29 NOTE — Assessment & Plan Note (Addendum)
Medications: Amlodipine, Catapress, Furosemide, Losartan Stop drinking Gatorade

## 2017-11-29 NOTE — Assessment & Plan Note (Signed)
PSA w/Urology Lupron

## 2017-11-29 NOTE — Patient Instructions (Signed)
Stop drinking Gatorade     Mediterranean diet:      Why follow it? Research shows. . Those who follow the Mediterranean diet have a reduced risk of heart disease  . The diet is associated with a reduced incidence of Parkinson's and Alzheimer's diseases . People following the diet may have longer life expectancies and lower rates of chronic diseases  . The Dietary Guidelines for Americans recommends the Mediterranean diet as an eating plan to promote health and prevent disease  What Is the Mediterranean Diet?  . Healthy eating plan based on typical foods and recipes of Mediterranean-style cooking . The diet is primarily a plant based diet; these foods should make up a majority of meals   Starches - Plant based foods should make up a majority of meals - They are an important sources of vitamins, minerals, energy, antioxidants, and fiber - Choose whole grains, foods high in fiber and minimally processed items  - Typical grain sources include wheat, oats, barley, corn, brown rice, bulgar, farro, millet, polenta, couscous  - Various types of beans include chickpeas, lentils, fava beans, black beans, white beans   Fruits  Veggies - Large quantities of antioxidant rich fruits & veggies; 6 or more servings  - Vegetables can be eaten raw or lightly drizzled with oil and cooked  - Vegetables common to the traditional Mediterranean Diet include: artichokes, arugula, beets, broccoli, brussel sprouts, cabbage, carrots, celery, collard greens, cucumbers, eggplant, kale, leeks, lemons, lettuce, mushrooms, okra, onions, peas, peppers, potatoes, pumpkin, radishes, rutabaga, shallots, spinach, sweet potatoes, turnips, zucchini - Fruits common to the Mediterranean Diet include: apples, apricots, avocados, cherries, clementines, dates, figs, grapefruits, grapes, melons, nectarines, oranges, peaches, pears, pomegranates, strawberries, tangerines  Fats - Replace butter and margarine with healthy oils, such as  olive oil, canola oil, and tahini  - Limit nuts to no more than a handful a day  - Nuts include walnuts, almonds, pecans, pistachios, pine nuts  - Limit or avoid candied, honey roasted or heavily salted nuts - Olives are central to the [employer] - can be eaten whole or used in a variety of dishes   Meats Protein - Limiting red meat: no more than a few times a month - When eating red meat: choose lean cuts and keep the portion to the size of deck of cards - Eggs: approx. 0 to 4 times a week  - Fish and lean poultry: at least 2 a week  - Healthy protein sources include, chicken, [place], lean beef, lamb - Increase intake of seafood such as tuna, salmon, trout, mackerel, shrimp, scallops - Avoid or limit high fat processed meats such as sausage and bacon  Dairy - Include moderate amounts of low fat dairy products  - Focus on healthy dairy such as fat free yogurt, skim milk, low or reduced fat cheese - Limit dairy products higher in fat such as whole or 2% milk, cheese, ice cream  Alcohol - Moderate amounts of red wine is ok  - No more than 5 oz daily for women (all ages) and men older than age 16  - No more than 10 oz of wine daily for men younger than 68  Other - Limit sweets and other desserts  - Use herbs and spices instead of salt to flavor foods  - Herbs and spices common to the traditional Mediterranean Diet include: basil, bay leaves, chives, cloves, cumin, fennel, garlic, lavender, marjoram, mint, oregano, parsley, pepper, rosemary, sage, savory, sumac, tarragon, thyme  It's not just a diet, it's a lifestyle:  . The Mediterranean diet includes lifestyle factors typical of those in the region  . Foods, drinks and meals are best eaten with others and savored . Daily physical activity is important for overall good health . This could be strenuous exercise like running and aerobics . This could also be more leisurely activities such as walking, housework, yard-work, or taking the  stairs . Moderation is the key; a balanced and healthy diet accommodates most foods and drinks . Consider portion sizes and frequency of consumption of certain foods   Meal Ideas & Options:  . Breakfast:  o Whole wheat toast or whole wheat English muffins with peanut butter & hard boiled egg o Steel cut oats topped with apples & cinnamon and skim milk  o Fresh fruit: banana, strawberries, melon, berries, peaches  o Smoothies: strawberries, bananas, greek yogurt, peanut butter o Low fat greek yogurt with blueberries and granola  o Egg white omelet with spinach and mushrooms o Breakfast couscous: whole wheat couscous, apricots, skim milk, cranberries  . Sandwiches:  o Hummus and grilled vegetables (peppers, zucchini, squash) on whole wheat bread   o Grilled chicken on whole wheat pita with lettuce, tomatoes, cucumbers or tzatziki  o Tuna salad on whole wheat bread: tuna salad made with greek yogurt, olives, red peppers, capers, green onions o Garlic rosemary lamb pita: lamb sauted with garlic, rosemary, salt & pepper; add lettuce, cucumber, greek yogurt to pita - flavor with lemon juice and black pepper  . Seafood:  o Mediterranean grilled salmon, seasoned with garlic, basil, parsley, lemon juice and black pepper o Shrimp, lemon, and spinach whole-grain pasta salad made with low fat greek yogurt  o Seared scallops with lemon orzo  o Seared tuna steaks seasoned salt, pepper, coriander topped with tomato mixture of olives, tomatoes, olive oil, minced garlic, parsley, green onions and cappers  . Meats:  o Herbed greek chicken salad with kalamata olives, cucumber, feta  o Red bell peppers stuffed with spinach, bulgur, lean ground beef (or lentils) & topped with feta   o Kebabs: skewers of chicken, tomatoes, onions, zucchini, squash  o [place] burgers: made with red onions, mint, dill, lemon juice, feta cheese topped with roasted red peppers . Vegetarian o Cucumber salad: cucumbers, artichoke  hearts, celery, red onion, feta cheese, tossed in olive oil & lemon juice  o Hummus and whole grain pita points with a greek salad (lettuce, tomato, feta, olives, cucumbers, red onion) o Lentil soup with celery, carrots made with vegetable broth, garlic, salt and pepper  o Tabouli salad: parsley, bulgur, mint, scallions, cucumbers, tomato, radishes, lemon juice, olive oil, salt and pepper.

## 2017-11-29 NOTE — Assessment & Plan Note (Signed)
Lovastatin 20 mg daily [facility]

## 2017-11-29 NOTE — Progress Notes (Signed)
Subjective:  Patient ID: Jeffrey Carter, male    DOB: [birth year]  Age: 77 y.o. MRN: [id number]  CC: No chief complaint on file.   HPI Jeffrey Carter presents for HTN, dyslipidemia, prostate ca f/u  Outpatient Medications Prior to Visit  Medication Sig Dispense Refill  . acetaminophen (TYLENOL) 325 MG tablet Take 2 tablets (650 mg total) by mouth every 8 (eight) hours. 30 tablet 0  . amLODipine (NORVASC) 5 MG tablet TAKE 1 TABLET BY MOUTH  DAILY 90 tablet 3  . cloNIDine (CATAPRES) 0.3 MG tablet TAKE 1 TABLET BY MOUTH TWO  TIMES DAILY 180 tablet 3  . diazepam (VALIUM) 5 MG tablet TAKE ONE TABLET BY MOUTH EVERY 12 HOURS AS NEEDED FOR ANXIETY OR  MUSCLE  SPASMS 30 tablet 2  . EQ ALLERGY RELIEF 10 MG tablet TAKE ONE TABLET BY MOUTH ONCE DAILY 90 tablet 3  . fluticasone (FLONASE) 50 MCG/ACT nasal spray Place 2 sprays into both nostrils daily. (Patient taking differently: Place 2 sprays into both nostrils daily as needed for allergies. ) 16 g 2  . furosemide (LASIX) 40 MG tablet TAKE 1 TABLET BY MOUTH  DAILY 90 tablet 3  . HYDROcodone-acetaminophen (NORCO) 7.5-325 MG tablet Take 1 tablet by mouth 4 (four) times daily as needed for moderate pain. 30 tablet 0  . indomethacin (INDOCIN) 50 MG capsule Take 1 capsule (50 mg total) by mouth 3 (three) times daily as needed. For gout attack 60 capsule 1  . losartan (COZAAR) 100 MG tablet TAKE 1 TABLET BY MOUTH ONCE DAILY 90 tablet 1  . lovastatin (MEVACOR) 40 MG tablet TAKE 1 TABLET BY MOUTH ONCE DAILY AT 6PM 90 tablet 3  . methocarbamol (ROBAXIN) 500 MG tablet Take 1 tablet (500 mg total) by mouth 2 (two) times daily. 20 tablet 0  . methylPREDNISolone (MEDROL DOSEPAK) 4 MG TBPK tablet As directed for gout 21 tablet 0  . potassium chloride (KLOR-CON) 8 MEQ tablet Take 1 tablet (8 mEq total) by mouth daily. 90 tablet 1  . sildenafil (VIAGRA) 100 MG tablet Take 1 tablet (100 mg total) by mouth as needed for erectile dysfunction. 12 tablet 5  .  tamsulosin (FLOMAX) 0.4 MG CAPS capsule Take 1 capsule (0.4 mg total) by mouth daily. 90 capsule 1   Facility-Administered Medications Prior to Visit  Medication Dose Route Frequency Provider Last Rate Last Dose  . 0.9 %  sodium chloride infusion  500 mL Intravenous Continuous Gatha Mayer, MD        ROS: Review of Systems  Constitutional: Positive for unexpected weight change. Negative for appetite change and fatigue.  HENT: Negative for congestion, nosebleeds, sneezing, sore throat and trouble swallowing.   Eyes: Negative for itching and visual disturbance.  Respiratory: Negative for cough.   Cardiovascular: Negative for chest pain, palpitations and leg swelling.  Gastrointestinal: Negative for abdominal distention, blood in stool, diarrhea and nausea.  Genitourinary: Negative for frequency and hematuria.  Musculoskeletal: Negative for back pain, gait problem, joint swelling and neck pain.  Skin: Negative for rash.  Neurological: Negative for dizziness, tremors, speech difficulty and weakness.  Psychiatric/Behavioral: Negative for agitation, dysphoric mood, sleep disturbance and suicidal ideas. The patient is not nervous/anxious.     Objective:  BP (!) 148/86 (BP Location: Left Arm, Patient Position: Sitting, Cuff Size: Large)   Pulse 69   Temp 97.7 F (36.5 C) (Oral)   Ht 5\' 9"  (1.753 m)   Wt 254 lb (115.2 kg)   SpO2  96%   BMI 37.51 kg/m   BP Readings from Last 3 Encounters:  11/29/17 (!) 148/86  07/25/17 (!) 162/90  05/08/17 140/80    Wt Readings from Last 3 Encounters:  11/29/17 254 lb (115.2 kg)  07/25/17 248 lb (112.5 kg)  05/08/17 243 lb 9.6 oz (110.5 kg)    Physical Exam  Constitutional: He is oriented to person, place, and time. He appears well-developed. No distress.  NAD  HENT:  Mouth/Throat: Oropharynx is clear and moist.  Eyes: Pupils are equal, round, and reactive to light. Conjunctivae are normal.  Neck: Normal range of motion. No JVD present. No  thyromegaly present.  Cardiovascular: Normal rate, regular rhythm, normal heart sounds and intact distal pulses. Exam reveals no gallop and no friction rub.  No murmur heard. Pulmonary/Chest: Effort normal and breath sounds normal. No respiratory distress. He has no wheezes. He has no rales. He exhibits no tenderness.  Abdominal: Soft. Bowel sounds are normal. He exhibits no distension and no mass. There is no tenderness. There is no rebound and no guarding.  Musculoskeletal: Normal range of motion. He exhibits no edema or tenderness.  Lymphadenopathy:    He has no cervical adenopathy.  Neurological: He is alert and oriented to person, place, and time. He has normal reflexes. No cranial nerve deficit. He exhibits normal muscle tone. He displays a negative Romberg sign. Coordination and gait normal.  Skin: Skin is warm and dry. No rash noted.  Psychiatric: He has a normal mood and affect. His behavior is normal. Judgment and thought content normal.  obese  Lab Results  Component Value Date   WBC 4.4 04/26/2017   HGB 13.7 04/26/2017   HCT 40.5 04/26/2017   PLT 204.0 04/26/2017   GLUCOSE 109 (H) 07/25/2017   CHOL 139 04/26/2017   TRIG 69.0 04/26/2017   HDL 47.00 04/26/2017   LDLCALC 78 04/26/2017   ALT 15 04/26/2017   AST 15 04/26/2017   NA 142 07/25/2017   K 3.6 07/25/2017   CL 105 07/25/2017   CREATININE 1.15 07/25/2017   BUN 13 07/25/2017   CO2 29 07/25/2017   TSH 1.69 04/26/2017   PSA 5.25 (H) 12/11/2013   INR 1.01 10/12/2014    No results found.  Assessment & Plan:   There are no diagnoses linked to this encounter.   No orders of the defined types were placed in this encounter.    Follow-up: No follow-ups on file.  [name], MD

## 2017-11-29 NOTE — Assessment & Plan Note (Signed)
No relapse 

## 2017-11-29 NOTE — Assessment & Plan Note (Signed)
BMET 

## 2017-12-19 DIAGNOSIS — G4733 Obstructive sleep apnea (adult) (pediatric): Secondary | ICD-10-CM | POA: Diagnosis not present

## 2018-01-29 ENCOUNTER — Other Ambulatory Visit: Payer: Self-pay | Admitting: Internal Medicine

## 2018-02-12 DIAGNOSIS — G4733 Obstructive sleep apnea (adult) (pediatric): Secondary | ICD-10-CM | POA: Diagnosis not present

## 2018-03-16 ENCOUNTER — Other Ambulatory Visit: Payer: Self-pay

## 2018-03-16 MED ORDER — LOSARTAN POTASSIUM 100 MG PO TABS: 100.0000 mg | ORAL_TABLET | Freq: Every day | ORAL | 1 refills | Status: DC

## 2018-03-19 DIAGNOSIS — G4733 Obstructive sleep apnea (adult) (pediatric): Secondary | ICD-10-CM | POA: Diagnosis not present

## 2018-04-27 ENCOUNTER — Ambulatory Visit: Payer: Medicare Other

## 2018-05-14 DIAGNOSIS — G4733 Obstructive sleep apnea (adult) (pediatric): Secondary | ICD-10-CM | POA: Diagnosis not present

## 2018-05-31 ENCOUNTER — Other Ambulatory Visit (INDEPENDENT_AMBULATORY_CARE_PROVIDER_SITE_OTHER): Payer: Medicare Other

## 2018-05-31 ENCOUNTER — Other Ambulatory Visit: Payer: Self-pay

## 2018-05-31 ENCOUNTER — Ambulatory Visit (INDEPENDENT_AMBULATORY_CARE_PROVIDER_SITE_OTHER): Payer: Medicare Other | Admitting: Internal Medicine

## 2018-05-31 ENCOUNTER — Encounter: Payer: Self-pay | Admitting: Internal Medicine

## 2018-05-31 DIAGNOSIS — E785 Hyperlipidemia, unspecified: Secondary | ICD-10-CM | POA: Diagnosis not present

## 2018-05-31 DIAGNOSIS — E876 Hypokalemia: Secondary | ICD-10-CM | POA: Diagnosis not present

## 2018-05-31 DIAGNOSIS — I10 Essential (primary) hypertension: Secondary | ICD-10-CM | POA: Diagnosis not present

## 2018-05-31 DIAGNOSIS — N320 Bladder-neck obstruction: Secondary | ICD-10-CM

## 2018-05-31 DIAGNOSIS — C61 Malignant neoplasm of prostate: Secondary | ICD-10-CM | POA: Diagnosis not present

## 2018-05-31 LAB — BASIC METABOLIC PANEL
BUN: 12 mg/dL (ref 6–23)
CO2: 29 mEq/L (ref 19–32)
Calcium: 8.7 mg/dL (ref 8.4–10.5)
Chloride: 104 mEq/L (ref 96–112)
Creatinine, Ser: 1.16 mg/dL (ref 0.40–1.50)
GFR: 74.06 mL/min (ref >60.00)
Glucose, Bld: 142 mg/dL — ABNORMAL HIGH (ref 70–99)
Potassium: 3.2 mEq/L — ABNORMAL LOW (ref 3.5–5.1)
Sodium: 140 mEq/L (ref 135–145)

## 2018-05-31 LAB — HEPATIC FUNCTION PANEL
ALT: 27 U/L (ref 0–53)
AST: 18 U/L (ref 0–37)
Albumin: 3.9 g/dL (ref 3.5–5.2)
Alkaline Phosphatase: 79 U/L (ref 39–117)
Bilirubin, Direct: 0.1 mg/dL (ref 0.0–0.3)
Total Bilirubin: 0.5 mg/dL (ref 0.2–1.2)
Total Protein: 6.6 g/dL (ref 6.0–8.3)

## 2018-05-31 LAB — URIC ACID: Uric Acid, Serum: 8 mg/dL — ABNORMAL HIGH (ref 4.0–7.8)

## 2018-05-31 NOTE — Assessment & Plan Note (Signed)
Lovastatin 

## 2018-05-31 NOTE — Assessment & Plan Note (Addendum)
Was on Lupron

## 2018-05-31 NOTE — Patient Instructions (Signed)
If you have medicare related insurance (such as traditional Medicare, Blue Cross Medicare, United Health[employer] Medicare, or similar), Please make an appointment at the scheduling desk with Jill, the Wellness Health Coach, for your Wellness visit in this office, which is a benefit with your insurance.  

## 2018-05-31 NOTE — Assessment & Plan Note (Signed)
Better  

## 2018-05-31 NOTE — Assessment & Plan Note (Signed)
Labs

## 2018-05-31 NOTE — Assessment & Plan Note (Signed)
Amlodipine, Catapress, Furosemide, Losartan  SBP 140 at home

## 2018-05-31 NOTE — Progress Notes (Signed)
Subjective:  Patient ID: Jeffrey Carter, male    DOB: 1941/01/21  Age: 78 y.o. MRN: [id number]  CC: No chief complaint on file.   HPI Jeffrey Carter presents for gout, LBP - better; HTN, allergies SBP 140 at home  Outpatient Medications Prior to Visit  Medication Sig Dispense Refill  . acetaminophen (TYLENOL) 325 MG tablet Take 2 tablets (650 mg total) by mouth every 8 (eight) hours. 30 tablet 0  . amLODipine (NORVASC) 5 MG tablet TAKE 1 TABLET BY MOUTH  DAILY 90 tablet 3  . cloNIDine (CATAPRES) 0.3 MG tablet TAKE 1 TABLET BY MOUTH TWO  TIMES DAILY 180 tablet 3  . diazepam (VALIUM) 5 MG tablet TAKE ONE TABLET BY MOUTH EVERY 12 HOURS AS NEEDED FOR ANXIETY OR  MUSCLE  SPASMS 30 tablet 2  . EQ ALLERGY RELIEF 10 MG tablet TAKE ONE TABLET BY MOUTH ONCE DAILY 90 tablet 3  . fluticasone (FLONASE) 50 MCG/ACT nasal spray Place 2 sprays into both nostrils daily. (Patient taking differently: Place 2 sprays into both nostrils daily as needed for allergies. ) 16 g 2  . furosemide (LASIX) 40 MG tablet TAKE 1 TABLET BY MOUTH  DAILY 90 tablet 3  . HYDROcodone-acetaminophen (NORCO) 7.5-325 MG tablet Take 1 tablet by mouth 4 (four) times daily as needed for moderate pain. 30 tablet 0  . indomethacin (INDOCIN) 50 MG capsule Take 1 capsule (50 mg total) by mouth 3 (three) times daily as needed. For gout attack 60 capsule 1  . losartan (COZAAR) 100 MG tablet Take 1 tablet (100 mg total) by mouth daily. 90 tablet 1  . lovastatin (MEVACOR) 40 MG tablet TAKE 1 TABLET BY MOUTH ONCE DAILY AT 6PM 90 tablet 3  . methocarbamol (ROBAXIN) 500 MG tablet Take 1 tablet (500 mg total) by mouth 2 (two) times daily. 20 tablet 0  . methylPREDNISolone (MEDROL DOSEPAK) 4 MG TBPK tablet As directed for gout 21 tablet 0  . potassium chloride (KLOR-CON) 8 MEQ tablet Take 1 tablet (8 mEq total) by mouth daily. 90 tablet 1  . sildenafil (VIAGRA) 100 MG tablet Take 1 tablet (100 mg total) by mouth as needed for erectile  dysfunction. 12 tablet 5  . tamsulosin (FLOMAX) 0.4 MG CAPS capsule TAKE 1 CAPSULE BY MOUTH  DAILY 90 capsule 1   Facility-Administered Medications Prior to Visit  Medication Dose Route Frequency Provider Last Rate Last Dose  . 0.9 %  sodium chloride infusion  500 mL Intravenous Continuous Gatha Mayer, MD        ROS: Review of Systems  Objective:  BP (!) 146/88 (BP Location: Left Arm, Patient Position: Sitting, Cuff Size: Large)   Pulse 82   Temp 98 F (36.7 C) (Oral)   Ht 5\' 9"  (1.753 m)   Wt 253 lb (114.8 kg)   SpO2 97%   BMI 37.36 kg/m   BP Readings from Last 3 Encounters:  05/31/18 (!) 146/88  11/29/17 (!) 148/86  07/25/17 (!) 162/90    Wt Readings from Last 3 Encounters:  05/31/18 253 lb (114.8 kg)  11/29/17 254 lb (115.2 kg)  07/25/17 248 lb (112.5 kg)    Physical Exam  Lab Results  Component Value Date   WBC 4.5 11/29/2017   HGB 13.0 11/29/2017   HCT 38.3 (L) 11/29/2017   PLT 199.0 11/29/2017   GLUCOSE 102 (H) 11/29/2017   CHOL 136 11/29/2017   TRIG 145.0 11/29/2017   HDL 38.30 (L) 11/29/2017   LDLCALC 69  11/29/2017   ALT 17 11/29/2017   AST 13 11/29/2017   NA 139 11/29/2017   K 3.7 11/29/2017   CL 104 11/29/2017   CREATININE 1.19 11/29/2017   BUN 12 11/29/2017   CO2 30 11/29/2017   TSH 1.70 11/29/2017   PSA 5.25 (H) 12/11/2013   INR 1.01 10/12/2014    No results found.  Assessment & Plan:   There are no diagnoses linked to this encounter.   No orders of the defined types were placed in this encounter.    Follow-up: No follow-ups on file.  [name], MD

## 2018-06-07 ENCOUNTER — Telehealth: Payer: Self-pay | Admitting: Internal Medicine

## 2018-06-07 MED ORDER — POTASSIUM CHLORIDE ER 8 MEQ PO TBCR: 8.0000 meq | EXTENDED_RELEASE_TABLET | Freq: Every day | ORAL | 1 refills | Status: DC

## 2018-06-07 NOTE — Telephone Encounter (Signed)
RX sent

## 2018-06-07 NOTE — Telephone Encounter (Signed)
Patient needs a refill of his Potassium. Please send to [facility]

## 2018-06-12 ENCOUNTER — Other Ambulatory Visit: Payer: Self-pay | Admitting: Internal Medicine

## 2018-06-12 MED ORDER — POTASSIUM CHLORIDE CRYS ER 20 MEQ PO TBCR: 20.0000 meq | EXTENDED_RELEASE_TABLET | Freq: Every day | ORAL | 11 refills | Status: DC

## 2018-06-15 ENCOUNTER — Other Ambulatory Visit: Payer: Self-pay | Admitting: Internal Medicine

## 2018-06-19 DIAGNOSIS — G4733 Obstructive sleep apnea (adult) (pediatric): Secondary | ICD-10-CM | POA: Diagnosis not present

## 2018-06-21 ENCOUNTER — Telehealth: Payer: Self-pay | Admitting: Internal Medicine

## 2018-06-21 NOTE — Telephone Encounter (Signed)
Copied from [place] [id number]. Topic: Quick Communication - Rx Refill/Question >> Jun 21, 2018  3:18 PM Jeffrey Carter wrote: Medication: lovastatin (MEVACOR) 40 MG tablet [[id number] furosemide (LASIX) 40 MG tablet [[id number]] needs this sent to another pharmacy   Has the patient contacted their pharmacy? no Preferred Pharmacy (with phone number or street name): [place], [place] [phone] (Phone) [phone] (Fax)    Agent: Please be advised that RX refills may take up to 3 business days. We ask that you follow-up with your pharmacy.

## 2018-06-22 MED ORDER — FUROSEMIDE 40 MG PO TABS: 40.0000 mg | ORAL_TABLET | Freq: Every day | ORAL | 3 refills | Status: DC

## 2018-06-22 MED ORDER — LOVASTATIN 40 MG PO TABS: ORAL_TABLET | ORAL | 3 refills | Status: DC

## 2018-06-22 NOTE — Telephone Encounter (Signed)
RX sent

## 2018-06-27 NOTE — Progress Notes (Addendum)
Subjective:   Jeffrey Carter is a 78 y.o. male who presents for Medicare Annual/Subsequent preventive examination. I connected with patient by a telephone and verified that I am speaking with the correct person using two identifiers. Patient stated full name and DOB. Patient gave permission to continue with telephonic visit. Patient's location was at home and Nurse's location was at [place] office.  Review of Systems:  No ROS.  Medicare Wellness Virtual Visit.  Visual/audio telehealth visit, UTA vital signs.   See social history for additional risk factors.  Cardiac Risk Factors include: dyslipidemia;hypertension;male gender Sleep patterns: feels rested on waking, gets up 0-1 times nightly to void and sleeps 7-8 hours nightly.    Home Safety/Smoke Alarms: Feels safe in home. Smoke alarms in place.  Living environment; residence and Firearm Safety: 1-story house/ trailer. Lives with wife, no needs for DME, good support system Seat Belt Safety/Bike Helmet: Wears seat belt.   PSA-  Lab Results  Component Value Date   PSA 5.25 (H) 12/11/2013   PSA 7.33 (H) 04/03/2013   PSA 5.07 (H) 05/30/2006      Objective:    Vitals: There were no vitals taken for this visit.  There is no height or weight on file to calculate BMI.  Advanced Directives 06/28/2018 04/25/2017 10/12/2016 08/31/2016 07/31/2016 06/14/2016 03/21/2016  Does Patient Have a Medical Advance Directive? No;Yes No No No No No -  Type of Paramedic of [place];Living will - - - - - -  Does patient want to make changes to medical advance directive? - - - - - - Yes (ED - Information included in AVS)  Copy of [place] in Chart? No - copy requested - - - - - -  Would patient like information on creating a medical advance directive? - Yes (ED - Information included in AVS) No - Patient declined No - Patient declined - - -    Tobacco Social History   Tobacco Use  Smoking Status Former Smoker  .  Packs/day: 0.25  . Years: 8.00  . Pack years: 2.00  . Types: Cigarettes  . Last attempt to quit: 01/24/1962  . Years since quitting: 64.4  Smokeless Tobacco Never Used     Counseling given: Not Answered  Past Medical History:  Diagnosis Date  . Bladder outlet obstruction   . BPH (benign prostatic hyperplasia)   . Conjunctivitis, acute, bilateral    07-02-2015  per pcp note and started antibiotic drops  . First degree heart block   . History of acute conjunctivitis    07-02-2015  resolved after round of antibiotic eye drops  . History of gout    BIG TOE  . History of urinary retention 12/2014  . Hx of adenomatous colonic polyps 07/07/2016  . Hyperlipidemia   . Hypertension   . Lower urinary tract symptoms (LUTS)   . OSA on CPAP    MODERATE PER STUDY 03-08-2012  . Osteoarthritis   . PAC (premature atrial contraction)   . 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 S/P radiation therapy 08-27-2014  to  10-22-2014   prostate 7800Gy in 40 sessions and seminal vesicals 5000Gy in 40 sessions  . Wears glasses    Past Surgical History:  Procedure Laterality Date  . APPENDECTOMY  1966  .  COLONOSCOPY  07-06-2006  . GOLD SEED IMPLANT N/A 08/12/2014   Procedure: GOLD SEED IMPLANT;  Surgeon: Lowella Bandy, MD;  Location: [facility];  Service: Urology;  Laterality: N/A;  . GREEN LIGHT LASER TURP (TRANSURETHRAL RESECTION OF PROSTATE N/A 07/20/2015   Procedure: GREEN LIGHT LASER ABLATION OF PROSTATE ;  Surgeon: Nickie Retort, MD;  Location: [facility];  Service: Urology;  Laterality: N/A;  . PROSTATE BIOPSY N/A 03/28/2014   Procedure: BIOPSY TRANSRECTAL ULTRASONIC PROSTATE (TUBP);  Surgeon: Arvil Persons, MD;  Location: [facility];  Service: Urology;  Laterality: N/A;  . TONSILLECTOMY  as  child  . TOTAL KNEE ARTHROPLASTY Right 06-22-2007   Family History  Problem Relation Age of Onset  . Diabetes Father   . Cancer Brother        pancreatic, stomach  . Colon cancer Neg Hx    Social History   Socioeconomic History  . Marital status: Married    Spouse name: Not on file  . Number of children: 4  . Years of education: Not on file  . Highest education level: Not on file  Occupational History  . Occupation: retired  Scientific laboratory technician  . Financial resource strain: Not hard at all  . Food insecurity:    Worry: Never true    Inability: Never true  . Transportation needs:    Medical: No    Non-medical: No  Tobacco Use  . Smoking status: Former Smoker    Packs/day: 0.25    Years: 8.00    Pack years: 2.00    Types: Cigarettes    Last attempt to quit: 01/24/1962    Years since quitting: 56.4  . Smokeless tobacco: Never Used  Substance and Sexual Activity  . Alcohol use: No  . Drug use: No  . Sexual activity: Yes    Partners: Female  Lifestyle  . Physical activity:    Days per week: 5 days    Minutes per session: 40 min  . Stress: Not at all  Relationships  . Social connections:    Talks on phone: More than three times a week    Gets together: More than three times a week    Attends religious service: More than 4 times per year    Active member of club or organization: Yes    Attends meetings of clubs or organizations: More than 4 times per year    Relationship status: Married  Other Topics Concern  . 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 acetaminophen (TYLENOL) 325 MG tablet Take 2 tablets (650 mg total) by mouth every 8 (eight) hours.  Marland Kitchen amLODipine (NORVASC) 5 MG tablet TAKE 1 TABLET BY MOUTH  DAILY  . cloNIDine (CATAPRES) 0.3 MG tablet Take 1 tablet  by mouth twice daily  . EQ ALLERGY RELIEF 10 MG tablet TAKE ONE TABLET BY MOUTH ONCE DAILY  . fluticasone (FLONASE) 50 MCG/ACT nasal spray Place 2 sprays into both nostrils daily. (Patient taking differently: Place 2 sprays into both nostrils daily as needed for allergies. )  . furosemide (LASIX) 40 MG tablet Take 1 tablet (40 mg total) by mouth daily.  Marland Kitchen HYDROcodone-acetaminophen (NORCO) 7.5-325 MG tablet Take 1 tablet by mouth 4 (four) times  daily as needed for moderate pain.  . indomethacin (INDOCIN) 50 MG capsule Take 1 capsule (50 mg total) by mouth 3 (three) times daily as needed. For gout attack  . losartan (COZAAR) 100 MG tablet Take 1 tablet by mouth once daily  . lovastatin (MEVACOR) 40 MG tablet TAKE 1 TABLET BY MOUTH ONCE DAILY AT 6PM  . methocarbamol (ROBAXIN) 500 MG tablet Take 1 tablet (500 mg total) by mouth 2 (two) times daily.  . methylPREDNISolone (MEDROL DOSEPAK) 4 MG TBPK tablet As directed for gout  . potassium chloride SA (K-DUR) 20 MEQ tablet Take 1 tablet (20 mEq total) by mouth daily.  . sildenafil (VIAGRA) 100 MG tablet Take 1 tablet (100 mg total) by mouth as needed for erectile dysfunction.  . tamsulosin (FLOMAX) 0.4 MG CAPS capsule TAKE 1 CAPSULE BY MOUTH  DAILY  . [DISCONTINUED] diazepam (VALIUM) 5 MG tablet TAKE ONE TABLET BY MOUTH EVERY 12 HOURS AS NEEDED FOR ANXIETY OR  MUSCLE  SPASMS (Patient not taking: Reported on 06/28/2018)  . [DISCONTINUED] potassium chloride (KLOR-CON) 8 MEQ tablet Take 1 tablet (8 mEq total) by mouth daily. (Patient not taking: Reported on 06/28/2018)   Facility-Administered Encounter Medications as of 06/28/2018  Medication  . 0.9 %  sodium chloride infusion    Activities of Daily Living In your present state of health, do you have any difficulty performing the following activities: 06/28/2018  Hearing? N  Vision? N  Difficulty concentrating or making decisions? N  Walking or climbing stairs? N  Dressing or bathing? N  Doing errands,  shopping? N  Preparing Food and eating ? N  Using the Toilet? N  In the past six months, have you accidently leaked urine? N  Do you have problems with loss of bowel control? N  Managing your Medications? N  Managing your Finances? N  Housekeeping or managing your Housekeeping? N  Some recent data might be hidden    Patient Care Team: Plotnikov, Evie Lacks, MD as PCP - General (Internal Medicine) Earlie Server, MD (Orthopedic Surgery) Nickie Retort, MD as Consulting Physician (Urology) Chesley Mires, MD as Consulting Physician (Pulmonary Disease)   Assessment:   This is a routine wellness examination for [place]. Physical assessment deferred to PCP.  Exercise Activities and Dietary recommendations Current Exercise Habits: Home exercise routine, Type of exercise: walking;calisthenics, Exercise limited by: None identified  Diet (meal preparation, eat out, water intake, caffeinated beverages, dairy products, fruits and vegetables): in general, a "healthy" diet  , well balanced  eats a variety of fruits and vegetables daily, limits salt, fat/cholesterol, sugar,carbohydrates,caffeine, drinks 6-8 glasses of water daily.  Goals    . Patient Stated     Continue to walk daily, eat healthy, enjoy life, family and stay independent    . Weight (lb) < 219 lb (99.3 kg)     Start to walk at the [facility] once a week. Increase water. Eat low salt, low cholesterol diet.        Fall Risk Fall Risk  06/28/2018 04/25/2017 04/25/2017 03/21/2016 01/11/2016  Falls in the past year? 0 No No No No  Number falls in past yr: 0 - - - -    Depression Screen PHQ 2/9 Scores 06/28/2018 04/25/2017 04/25/2017 03/21/2016  PHQ - 2 Score 0 0 0 1  PHQ- 9 Score - 1 - -    Cognitive Function MMSE - Mini Mental State Exam 04/25/2017 03/21/2016  Orientation to time 5 5  Orientation to Place 5 5  Registration 3 3  Attention/ Calculation 5 5  Recall 1 1  Language- name 2 objects 2 2  Language- repeat 1 1  Language-  follow 3 step command 3 3  Language- read & follow direction 1 1  Write a sentence 1 1  Copy design 1 1  Total score 28 28       Ad8 score reviewed for issues:  Issues making decisions: no  Less interest in hobbies / activities: no  Repeats questions, stories (family complaining): no  Trouble using ordinary gadgets (microwave, computer, phone):no  Forgets the month or year: no  Mismanaging finances: no  Remembering appts: no  Daily problems with thinking and/or memory: no Ad8 score is= 0  Immunization History  Administered Date(s) Administered  . Influenza Split 11/25/2010, 11/03/2011  . Influenza Whole 10/24/2007, 10/30/2008, 09/24/2009  . Influenza, High Dose Seasonal PF 09/22/2015, 10/18/2016, 10/17/2017  . Influenza,inj,Quad PF,6+ Mos 10/17/2012, 10/11/2013, 10/09/2014  . PPD Test 10/29/2013, 10/29/2013  . Pneumococcal Conjugate-13 02/20/2013  . Pneumococcal Polysaccharide-23 10/30/2008, 10/12/2015  . Td 10/30/2008  . Tdap 11/25/2010  . Zoster 03/13/2013   Screening Tests Health Maintenance  Topic Date Due  . INFLUENZA VACCINE  08/25/2018  . TETANUS/TDAP  11/24/2020  . COLONOSCOPY  06/28/2021  . PNA vac Low Risk Adult  Completed       Plan:    Reviewed health maintenance screenings with patient today and relevant education, vaccines, and/or referrals were provided.   Continue to eat heart healthy diet (full of fruits, vegetables, whole grains, lean protein, water--limit salt, fat, and sugar intake) and increase physical activity as tolerated.  Continue doing brain stimulating activities (puzzles, reading, adult coloring books, staying active) to keep memory sharp.   I have personally reviewed and noted the following in the patient's chart:   . Medical and social history . Use of alcohol, tobacco or illicit drugs  . Current medications and supplements . Functional ability and status . Nutritional status . Physical activity . Advanced directives .  List of other physicians . Screenings to include cognitive, depression, and falls . Referrals and appointments  In addition, I have reviewed and discussed with patient certain preventive protocols, quality metrics, and best practice recommendations. A written personalized care plan for preventive services as well as general preventive health recommendations were provided to patient.     Michiel Cowboy, RN  06/28/2018  Medical screening examination/treatment/procedure(s) were performed by non-physician practitioner and as supervising physician I was immediately available for consultation/collaboration. I agree with above. Lew Dawes, MD

## 2018-06-28 ENCOUNTER — Ambulatory Visit (INDEPENDENT_AMBULATORY_CARE_PROVIDER_SITE_OTHER): Payer: Medicare Other | Admitting: *Deleted

## 2018-06-28 DIAGNOSIS — Z0000 Encounter for general adult medical examination without abnormal findings: Secondary | ICD-10-CM

## 2018-08-09 ENCOUNTER — Ambulatory Visit: Payer: Medicare Other | Admitting: Pulmonary Disease

## 2018-08-09 ENCOUNTER — Encounter: Payer: Self-pay | Admitting: Pulmonary Disease

## 2018-08-09 ENCOUNTER — Other Ambulatory Visit: Payer: Self-pay

## 2018-08-09 VITALS — BP 145/86 | HR 70 | Temp 97.9°F | Ht 69.0 in | Wt 254.6 lb

## 2018-08-09 DIAGNOSIS — E669 Obesity, unspecified: Secondary | ICD-10-CM | POA: Diagnosis not present

## 2018-08-09 DIAGNOSIS — Z9989 Dependence on other enabling machines and devices: Secondary | ICD-10-CM

## 2018-08-09 DIAGNOSIS — G4733 Obstructive sleep apnea (adult) (pediatric): Secondary | ICD-10-CM

## 2018-08-09 NOTE — Progress Notes (Signed)
[facility]  Chief Complaint  Patient presents with  . Follow-up    CPAP    Constitutional:  BP (!) 145/86 (BP Location: Right Arm, Cuff Size: Normal)   Pulse 70   Temp 97.9 F (36.6 C) (Oral)   Ht 5\' 9"  (1.753 m)   Wt 254 lb 9.6 oz (115.5 kg)   SpO2 99%   BMI 37.60 kg/m   Past Medical History:  Prostate cancer, PACs, OA, HTN, HLD, Colon polyps, Gout, BPH  Brief Summary:  Jeffrey Carter is a 78 y.o. male with obstructive sleep apnea.  He has been using CPAP nightly.  Has full face mask.  No issue with ask fit.  Denies sinus congestion, sore throat, dry mouth, or aerophagia.  Feels rested during the day.  Doesn't need to take naps.  Uses bathroom 1 or 2 times during the night, and goes right back to sleep.  Physical Exam:   Appearance - well kempt   ENMT - clear nasal mucosa, midline nasal  septum, no oral exudates, no LAN, trachea midline  Respiratory - normal chest wall, normal respiratory effort, no accessory muscle use, no wheeze/rales  CV - s1s2 regular rate and rhythm, no murmurs, no peripheral edema, radial pulses symmetric  GI - soft, non tender, no masses  Lymph - no adenopathy noted in neck and axillary areas  MSK - normal gait  Ext - no cyanosis, clubbing, or joint inflammation noted  Skin - no rashes, lesions, or ulcers  Neuro - normal strength, oriented x 3  Psych - normal mood and affect   Assessment/Plan:   Obstructive sleep apnea. - he is compliant with CPAP and reports benefit - continue CPAP 12 cm H2O  Obesity. - discussed importance of weight loss  Patient Instructions  Follow up in 1 year   Chesley Mires, MD [place] Pager: [phone] 08/09/2018, 12:25 PM  Flow Sheet      Sleep tests:  PSG 02/29/12 >> AHI 32, SpO2 low 79% CPAP 03546 to 08/08/18 >> used on 30 of 30 nights with average 7 hrs 35 min.  Average AHI 3 with CPAP 12 cm H2O  Medications:    Allergies as of 08/09/2018      Reactions   Percocet [oxycodone-acetaminophen]    nausea      Medication List       Accurate as of August 09, 2018 12:25 PM. If you have any questions, ask your nurse or doctor.        acetaminophen 325 MG tablet Commonly known as: TYLENOL Take 2 tablets (650 mg total) by mouth every 8 (eight) hours.   amLODipine 5 MG tablet Commonly known as: NORVASC TAKE 1 TABLET BY MOUTH  DAILY   cloNIDine 0.3 MG tablet Commonly known as: CATAPRES Take 1 tablet by mouth twice daily   EQ Allergy Relief 10 MG tablet Generic drug: loratadine TAKE ONE TABLET BY MOUTH ONCE DAILY   fluticasone 50 MCG/ACT nasal spray Commonly known as: FLONASE Place 2 sprays into both nostrils daily. What changed:   when to take this  reasons to take this   furosemide 40 MG tablet Commonly known as: LASIX Take 1 tablet (40 mg total) by mouth daily.   HYDROcodone-acetaminophen 7.5-325 MG tablet Commonly known as: NORCO Take 1 tablet by mouth 4 (four) times daily as needed for moderate pain.   indomethacin 50 MG capsule Commonly known as: INDOCIN Take 1 capsule (50 mg total) by mouth 3 (  three) times daily as needed. For gout attack   losartan 100 MG tablet Commonly known as: COZAAR Take 1 tablet by mouth once daily   lovastatin 40 MG tablet Commonly known as: MEVACOR TAKE 1 TABLET BY MOUTH ONCE DAILY AT 6PM   methocarbamol 500 MG tablet Commonly known as: ROBAXIN Take 1 tablet (500 mg total) by mouth 2 (two) times daily.   methylPREDNISolone 4 MG Tbpk tablet Commonly known as: MEDROL DOSEPAK As directed for gout   potassium chloride SA 20 MEQ tablet Commonly known as: K-DUR Take 1 tablet (20 mEq total) by mouth daily.   sildenafil 100 MG tablet Commonly known as: Viagra Take 1 tablet (100 mg total) by mouth as needed for erectile dysfunction.   tamsulosin 0.4 MG Caps capsule Commonly known as: FLOMAX TAKE 1 CAPSULE BY MOUTH  DAILY       Past  Surgical History:  He  has a past surgical history that includes Total knee arthroplasty (Right, 06-22-2007); Colonoscopy (07-06-2006); Tonsillectomy (as child); Appendectomy (1966); Prostate biopsy (N/A, 03/28/2014); Gold seed implant (N/A, 08/12/2014); and Green light laser turp (transurethral resection of prostate (N/A, 07/20/2015).  Family History:  His family history includes Cancer in his brother; Diabetes in his father.  Social History:  He  reports that he quit smoking about 56 years ago. His smoking use included cigarettes. He has a 2.00 pack-year smoking history. He has never used smokeless tobacco. He reports that he does not drink alcohol or use drugs.

## 2018-08-09 NOTE — Patient Instructions (Signed)
Follow up in 1 year.

## 2018-08-14 IMAGING — CT CT RENAL STONE PROTOCOL
2 of 3 series · 15 of 46 positions shown, 17 images · non-contrast
Comparison: CT scan 03/22/2004

CLINICAL DATA: Left flank pain and hematuria since last evening.
Prior history of prostate cancer.

EXAM:
CT ABDOMEN AND PELVIS WITHOUT CONTRAST
TECHNIQUE: Multidetector CT imaging of the abdomen and pelvis was performed
following the standard protocol without IV contrast.

[Series 3: coronal · coronal · 0.74mm/px · 3 of 177 slices shown]
[im 59/177  soft-tissue]
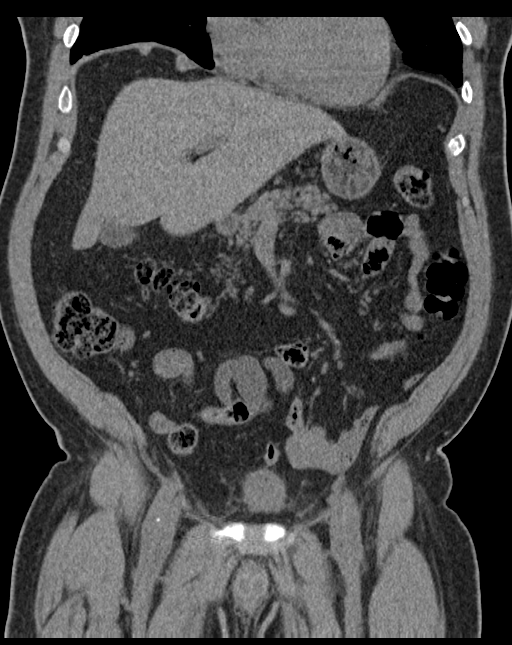
[im 79/177  soft-tissue]
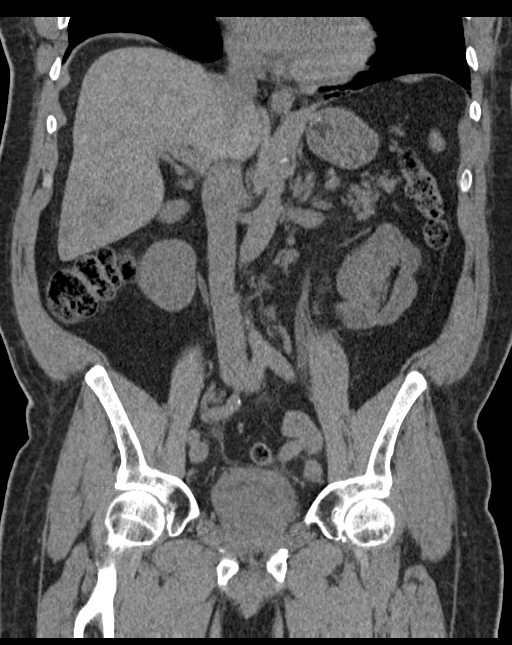
[im 98/177  soft-tissue]
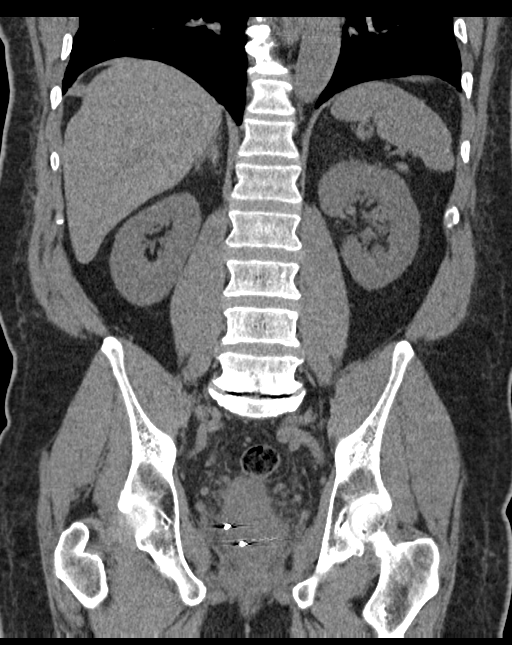

[Series 6: lung · axial · 0.87mm/px · z∈[+1372,+1478]mm · 12 of 61 slices shown, 14 images]
[im 4/61  soft-tissue]
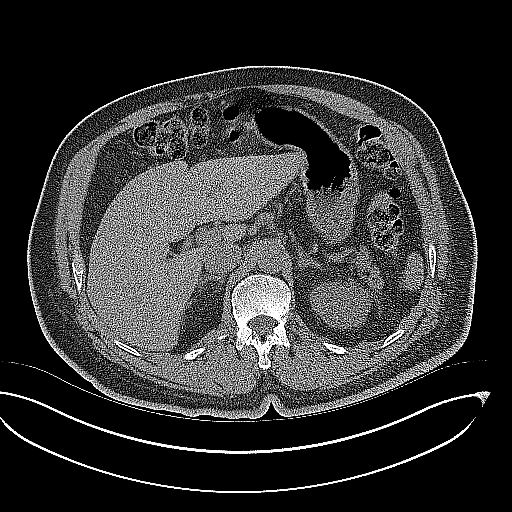
[im 4/61  bone]
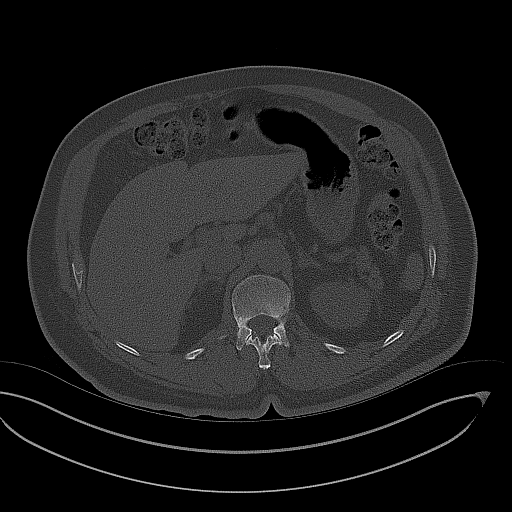
[im 8/61  soft-tissue]
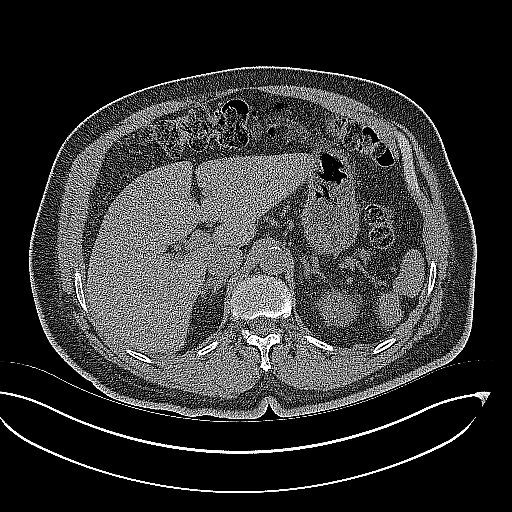
[im 14/61  soft-tissue]
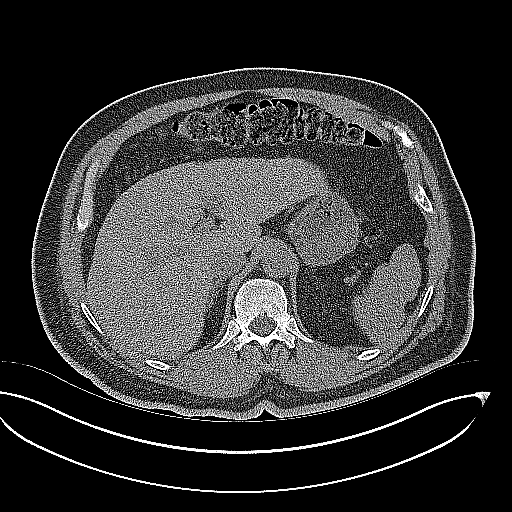
[im 18/61  soft-tissue]
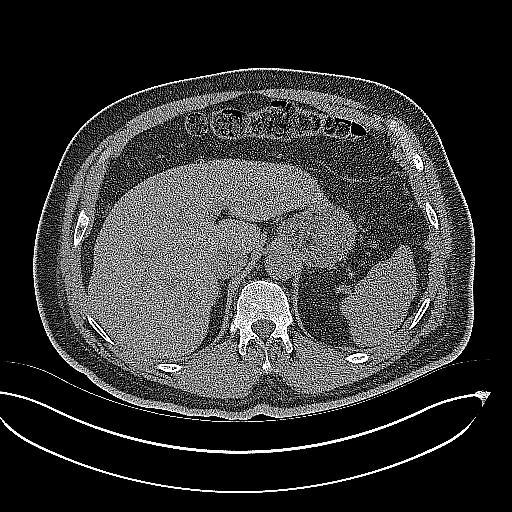
[im 24/61  soft-tissue]
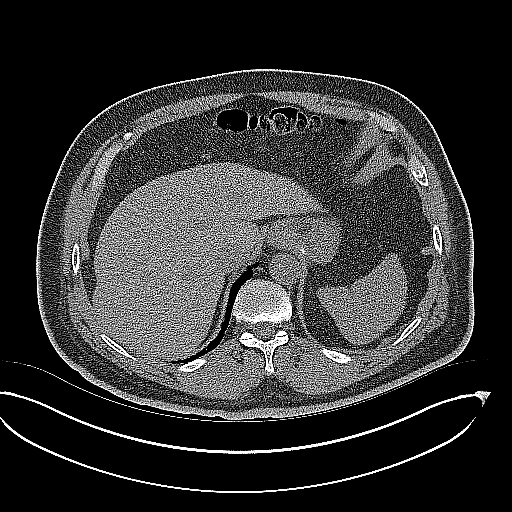
[im 28/61  soft-tissue]
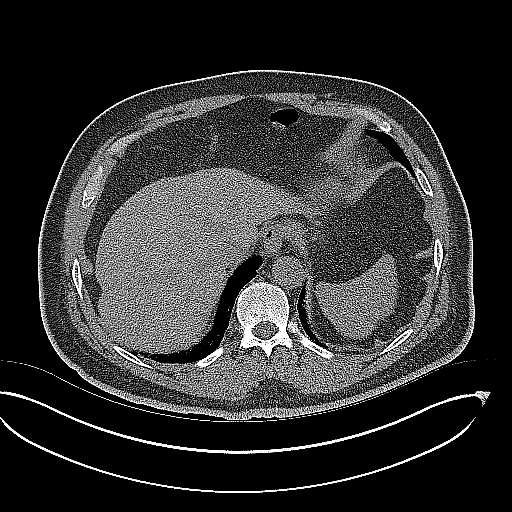
[im 33/61  soft-tissue]
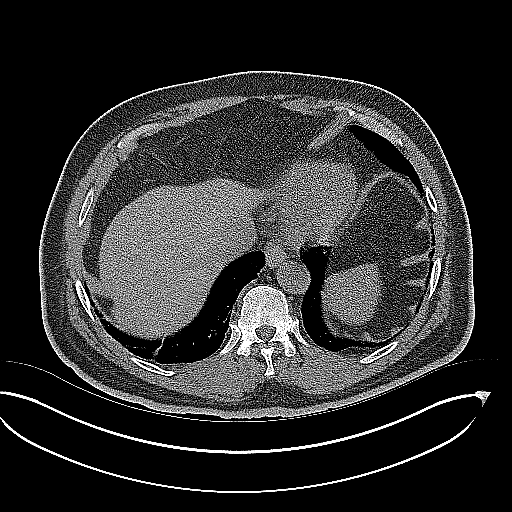
[im 37/61  soft-tissue]
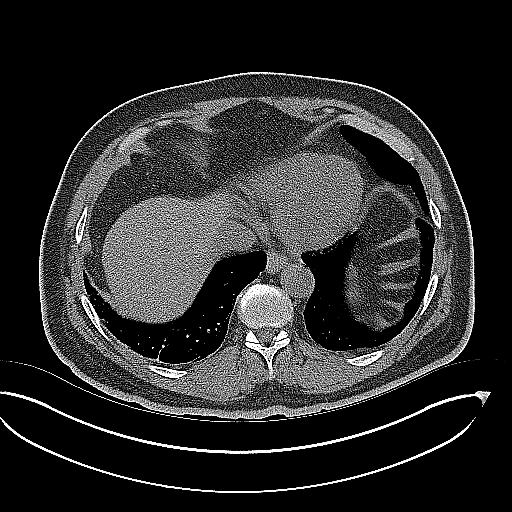
[im 43/61  soft-tissue]
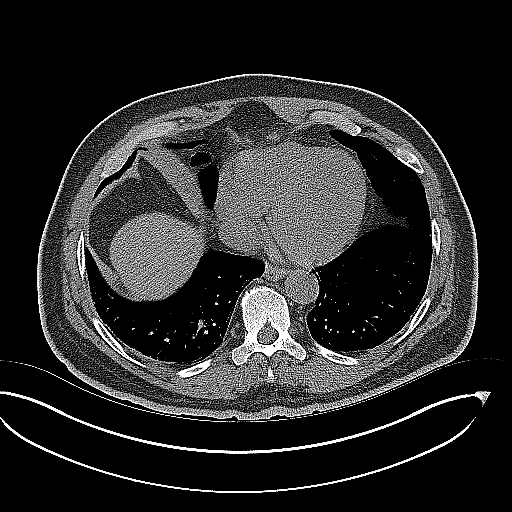
[im 43/61  bone]
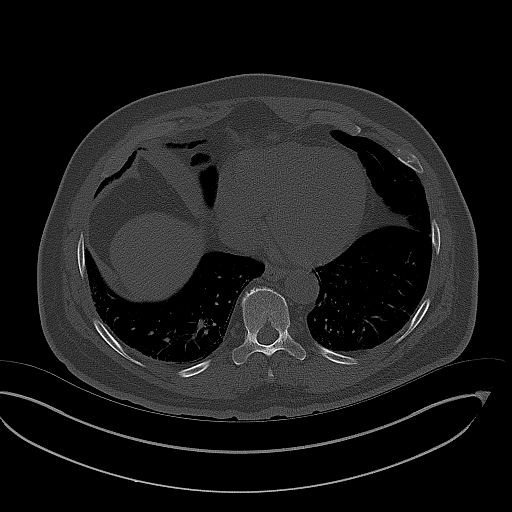
[im 47/61  soft-tissue]
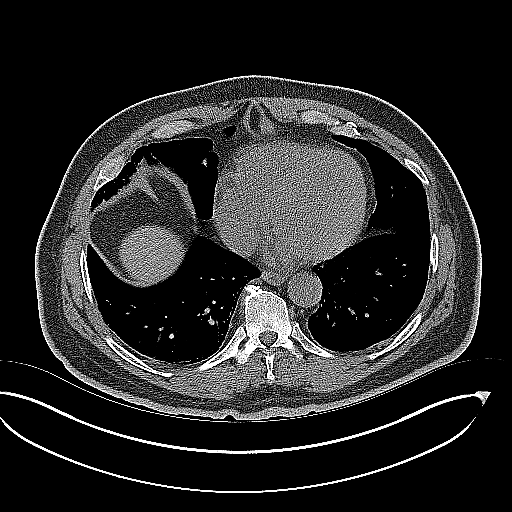
[im 53/61  soft-tissue]
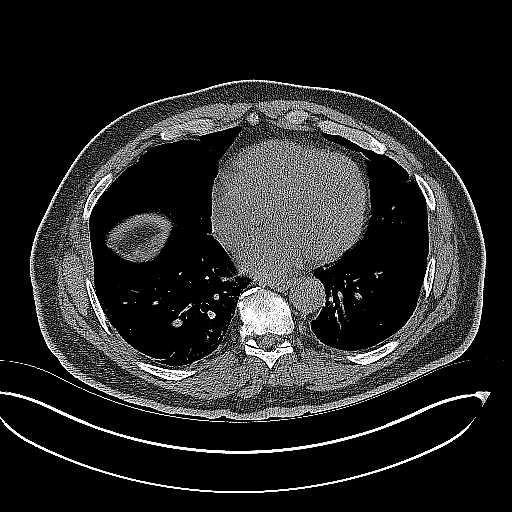
[im 57/61  soft-tissue]
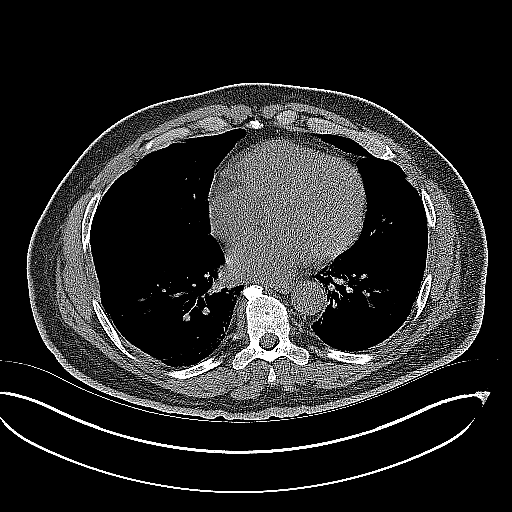

[15 of 46 positions shown; findings below may reference images not displayed]

FINDINGS: Lower chest: The lung bases are clear acute process. Chronic
scarring changes and mild lower lobe bronchiectasis. No worrisome
pulmonary lesions. The heart is normal in size. No pericardial
effusion.

Hepatobiliary: Stable right hepatic lobe hemangioma based on prior
examinations. No new lesions. No biliary dilatation. The gallbladder
is grossly normal.

Pancreas: No mass, inflammation or ductal dilatation.

Spleen: Normal size.  No focal lesions.

Adrenals/Urinary Tract: The adrenal glands are unremarkable.

No renal, ureteral or bladder calculi. No obvious renal lesions.
Simple appearing right renal cyst.

Moderate diffuse bladder wall thickening and persistent enlarged
prostate gland impressing on the base of bladder. Could not exclude
bladder cancer on this examination. Contrast-enhanced study may be
helpful for further evaluation. Scattered brachytherapy seeds are
noted in the region of the prostate gland.

Stomach/Bowel: The stomach, duodenum, small bowel and colon are
grossly normal without oral contrast. No inflammatory changes, mass
lesions or obstructive findings. The terminal ileum is normal.
Colonic interposition noted. Descending and sigmoid diverticulosis
without findings for acute diverticulitis.

Vascular/Lymphatic: Minimal scattered atherosclerotic calcifications
for age. No aneurysm. No mesenteric or retroperitoneal mass or
lymphadenopathy. Small scattered lymph nodes are stable. No pelvic
mass or adenopathy.

Reproductive: Enlarged prostate gland with mass effect on the base
the bladder. Seminal vesicles are grossly normal.

Other: No free pelvic fluid collections. No inguinal mass or
adenopathy. Bilateral inguinal hernias containing fat.

Musculoskeletal: No new lesions.
IMPRESSION: 1. No renal, ureteral or bladder calculi.
2. Irregular thick walled bladder and possible intraluminal lesion.
Recommend cystoscopy or contrast enhanced study.
3. Stable right hepatic lobe hemangioma.
4. A few small scattered sclerotic bone lesions are stable.

## 2018-08-21 DIAGNOSIS — H5213 Myopia, bilateral: Secondary | ICD-10-CM | POA: Diagnosis not present

## 2018-08-21 DIAGNOSIS — H35033 Hypertensive retinopathy, bilateral: Secondary | ICD-10-CM | POA: Diagnosis not present

## 2018-08-21 DIAGNOSIS — H35363 Drusen (degenerative) of macula, bilateral: Secondary | ICD-10-CM | POA: Diagnosis not present

## 2018-08-21 DIAGNOSIS — H524 Presbyopia: Secondary | ICD-10-CM | POA: Diagnosis not present

## 2018-09-10 ENCOUNTER — Other Ambulatory Visit: Payer: Self-pay | Admitting: Internal Medicine

## 2018-10-03 ENCOUNTER — Ambulatory Visit (INDEPENDENT_AMBULATORY_CARE_PROVIDER_SITE_OTHER): Payer: Medicare Other | Admitting: Internal Medicine

## 2018-10-03 ENCOUNTER — Other Ambulatory Visit: Payer: Self-pay

## 2018-10-03 ENCOUNTER — Encounter: Payer: Self-pay | Admitting: Internal Medicine

## 2018-10-03 ENCOUNTER — Other Ambulatory Visit (INDEPENDENT_AMBULATORY_CARE_PROVIDER_SITE_OTHER): Payer: Medicare Other

## 2018-10-03 VITALS — BP 132/84 | HR 88 | Temp 97.7°F | Ht 69.0 in | Wt 257.0 lb

## 2018-10-03 DIAGNOSIS — I10 Essential (primary) hypertension: Secondary | ICD-10-CM | POA: Diagnosis not present

## 2018-10-03 DIAGNOSIS — E876 Hypokalemia: Secondary | ICD-10-CM

## 2018-10-03 DIAGNOSIS — Z23 Encounter for immunization: Secondary | ICD-10-CM | POA: Diagnosis not present

## 2018-10-03 DIAGNOSIS — C61 Malignant neoplasm of prostate: Secondary | ICD-10-CM | POA: Diagnosis not present

## 2018-10-03 DIAGNOSIS — Z7721 Contact with and (suspected) exposure to potentially hazardous body fluids: Secondary | ICD-10-CM

## 2018-10-03 DIAGNOSIS — E785 Hyperlipidemia, unspecified: Secondary | ICD-10-CM | POA: Diagnosis not present

## 2018-10-03 DIAGNOSIS — M109 Gout, unspecified: Secondary | ICD-10-CM

## 2018-10-03 LAB — URINALYSIS
Bilirubin Urine: NEGATIVE
Hgb urine dipstick: NEGATIVE
Ketones, ur: NEGATIVE
Leukocytes,Ua: NEGATIVE
Nitrite: NEGATIVE
Specific Gravity, Urine: 1.01 (ref 1.000–1.030)
Total Protein, Urine: NEGATIVE
Urine Glucose: NEGATIVE
Urobilinogen, UA: 0.2 (ref 0.0–1.0)
pH: 6 (ref 5.0–8.0)

## 2018-10-03 LAB — BASIC METABOLIC PANEL
BUN: 11 mg/dL (ref 6–23)
CO2: 28 mEq/L (ref 19–32)
Calcium: 8.7 mg/dL (ref 8.4–10.5)
Chloride: 104 mEq/L (ref 96–112)
Creatinine, Ser: 1.02 mg/dL (ref 0.40–1.50)
GFR: 85.83 mL/min (ref >60.00)
Glucose, Bld: 105 mg/dL — ABNORMAL HIGH (ref 70–99)
Potassium: 3.5 mEq/L (ref 3.5–5.1)
Sodium: 139 mEq/L (ref 135–145)

## 2018-10-03 NOTE — Assessment & Plan Note (Signed)
Cont w/meds BMET

## 2018-10-03 NOTE — Addendum Note (Signed)
Addended by: Karren Cobble on: 10/03/2018 02:50 PM   Modules accepted: Orders

## 2018-10-03 NOTE — Assessment & Plan Note (Signed)
F/u w/Urol on Monday

## 2018-10-03 NOTE — Addendum Note (Signed)
Addended by: Karren Cobble on: 10/03/2018 03:37 PM   Modules accepted: Orders

## 2018-10-03 NOTE — Assessment & Plan Note (Signed)
KCl Labs 

## 2018-10-03 NOTE — Assessment & Plan Note (Signed)
No relapse 

## 2018-10-03 NOTE — Patient Instructions (Signed)
If you have medicare related insurance (such as traditional Medicare, Blue Cross Medicare, United Health[employer] Medicare, or similar), Please make an appointment at the scheduling desk with Jill, the Wellness Health Coach, for your Wellness visit in this office, which is a benefit with your insurance.  

## 2018-10-03 NOTE — Assessment & Plan Note (Signed)
Lovastatin 

## 2018-10-03 NOTE — Assessment & Plan Note (Signed)
wife tested Pos for ???STD Labs

## 2018-10-03 NOTE — Progress Notes (Signed)
Subjective:  Patient ID: Jeffrey Carter, male    DOB: 04/13/1940  Age: 78 y.o. MRN: [id number]  CC: No chief complaint on file.   HPI Jeffrey Carter presents for HTN, LBP, ED f/u C/o wife tested Pos for ???STD  Outpatient Medications Prior to Visit  Medication Sig Dispense Refill  . acetaminophen (TYLENOL) 325 MG tablet Take 2 tablets (650 mg total) by mouth every 8 (eight) hours. 30 tablet 0  . amLODipine (NORVASC) 5 MG tablet TAKE 1 TABLET BY MOUTH  DAILY 90 tablet 3  . cloNIDine (CATAPRES) 0.3 MG tablet Take 1 tablet by mouth twice daily 180 tablet 1  . EQ ALLERGY RELIEF 10 MG tablet TAKE ONE TABLET BY MOUTH ONCE DAILY 90 tablet 3  . fluticasone (FLONASE) 50 MCG/ACT nasal spray Place 2 sprays into both nostrils daily. (Patient taking differently: Place 2 sprays into both nostrils daily as needed for allergies. ) 16 g 2  . furosemide (LASIX) 40 MG tablet Take 1 tablet (40 mg total) by mouth daily. 90 tablet 3  . HYDROcodone-acetaminophen (NORCO) 7.5-325 MG tablet Take 1 tablet by mouth 4 (four) times daily as needed for moderate pain. 30 tablet 0  . indomethacin (INDOCIN) 50 MG capsule Take 1 capsule (50 mg total) by mouth 3 (three) times daily as needed. For gout attack 60 capsule 1  . losartan (COZAAR) 100 MG tablet Take 1 tablet by mouth once daily 90 tablet 1  . lovastatin (MEVACOR) 40 MG tablet TAKE 1 TABLET BY MOUTH ONCE DAILY AT 6PM 90 tablet 3  . methocarbamol (ROBAXIN) 500 MG tablet Take 1 tablet (500 mg total) by mouth 2 (two) times daily. 20 tablet 0  . methylPREDNISolone (MEDROL DOSEPAK) 4 MG TBPK tablet As directed for gout 21 tablet 0  . potassium chloride SA (K-DUR) 20 MEQ tablet Take 1 tablet (20 mEq total) by mouth daily. 30 tablet 11  . sildenafil (VIAGRA) 100 MG tablet Take 1 tablet (100 mg total) by mouth as needed for erectile dysfunction. 12 tablet 5  . tamsulosin (FLOMAX) 0.4 MG CAPS capsule TAKE 1 CAPSULE BY MOUTH  DAILY 90 capsule 1   Facility-Administered  Medications Prior to Visit  Medication Dose Route Frequency Provider Last Rate Last Dose  . 0.9 %  sodium chloride infusion  500 mL Intravenous Continuous Gatha Mayer, MD        ROS: Review of Systems  Constitutional: Negative for appetite change, fatigue and unexpected weight change.  HENT: Negative for congestion, nosebleeds, sneezing, sore throat and trouble swallowing.   Eyes: Negative for itching and visual disturbance.  Respiratory: Negative for cough.   Cardiovascular: Negative for chest pain, palpitations and leg swelling.  Gastrointestinal: Negative for abdominal distention, blood in stool, diarrhea and nausea.  Genitourinary: Negative for frequency and hematuria.  Musculoskeletal: Positive for arthralgias. Negative for back pain, gait problem, joint swelling and neck pain.  Skin: Negative for rash.  Neurological: Negative for dizziness, tremors, speech difficulty and weakness.  Psychiatric/Behavioral: Negative for agitation, dysphoric mood, sleep disturbance and suicidal ideas. The patient is not nervous/anxious.     Objective:  BP 132/84 (BP Location: Left Arm, Patient Position: Sitting, Cuff Size: Large)   Pulse 88   Temp 97.7 F (36.5 C) (Oral)   Ht 5\' 9"  (1.753 m)   Wt 257 lb (116.6 kg)   SpO2 98%   BMI 37.95 kg/m   BP Readings from Last 3 Encounters:  10/03/18 132/84  08/09/18 (!) 145/86  05/31/18 Marland Kitchen)  146/88    Wt Readings from Last 3 Encounters:  10/03/18 257 lb (116.6 kg)  08/09/18 254 lb 9.6 oz (115.5 kg)  05/31/18 253 lb (114.8 kg)    Physical Exam Constitutional:      General: He is not in acute distress.    Appearance: He is well-developed.     Comments: NAD  Eyes:     Conjunctiva/sclera: Conjunctivae normal.     Pupils: Pupils are equal, round, and reactive to light.  Neck:     Musculoskeletal: Normal range of motion.     Thyroid: No thyromegaly.     Vascular: No JVD.  Cardiovascular:     Rate and Rhythm: Normal rate and regular  rhythm.     Heart sounds: Normal heart sounds. No murmur. No friction rub. No gallop.   Pulmonary:     Effort: Pulmonary effort is normal. No respiratory distress.     Breath sounds: Normal breath sounds. No wheezing or rales.  Chest:     Chest wall: No tenderness.  Abdominal:     General: Bowel sounds are normal. There is no distension.     Palpations: Abdomen is soft. There is no mass.     Tenderness: There is no abdominal tenderness. There is no guarding or rebound.  Musculoskeletal: Normal range of motion.        General: No tenderness.  Lymphadenopathy:     Cervical: No cervical adenopathy.  Skin:    General: Skin is warm and dry.     Findings: No rash.  Neurological:     Mental Status: He is alert and oriented to person, place, and time.     Cranial Nerves: No cranial nerve deficit.     Motor: No abnormal muscle tone.     Coordination: Coordination normal.     Gait: Gait normal.     Deep Tendon Reflexes: Reflexes are normal and symmetric.  Psychiatric:        Behavior: Behavior normal.        Thought Content: Thought content normal.        Judgment: Judgment normal.     Lab Results  Component Value Date   WBC 4.5 11/29/2017   HGB 13.0 11/29/2017   HCT 38.3 (L) 11/29/2017   PLT 199.0 11/29/2017   GLUCOSE 142 (H) 05/31/2018   CHOL 136 11/29/2017   TRIG 145.0 11/29/2017   HDL 38.30 (L) 11/29/2017   LDLCALC 69 11/29/2017   ALT 27 05/31/2018   AST 18 05/31/2018   NA 140 05/31/2018   K 3.2 (L) 05/31/2018   CL 104 05/31/2018   CREATININE 1.16 05/31/2018   BUN 12 05/31/2018   CO2 29 05/31/2018   TSH 1.70 11/29/2017   PSA 5.25 (H) 12/11/2013   INR 1.01 10/12/2014    No results found.  Assessment & Plan:   There are no diagnoses linked to this encounter.   No orders of the defined types were placed in this encounter.    Follow-up: No follow-ups on file.  [name], MD

## 2018-10-04 LAB — RPR: RPR Ser Ql: NONREACTIVE

## 2018-10-04 LAB — HIV ANTIBODY (ROUTINE TESTING W REFLEX): HIV 1&2 Ab, 4th Generation: NONREACTIVE

## 2018-10-05 LAB — GC/CHLAMYDIA PROBE AMP
Chlamydia trachomatis, NAA: NEGATIVE
Neisseria Gonorrhoeae by PCR: NEGATIVE

## 2018-10-08 ENCOUNTER — Telehealth: Payer: Self-pay | Admitting: Internal Medicine

## 2018-10-08 ENCOUNTER — Other Ambulatory Visit: Payer: Self-pay | Admitting: Internal Medicine

## 2018-10-08 NOTE — Telephone Encounter (Signed)
Medication Refill - Medication: cloNIDine (CATAPRES) 0.3 MG tablet  Has the patient contacted their pharmacy? Yes.     Preferred Pharmacy:  [place], [place] - [place] [phone] (Phone) [phone] (Fax)     Pt was advised that RX refills may take up to 3 business days. We ask that you follow-up with your pharmacy.

## 2018-10-09 DIAGNOSIS — G4733 Obstructive sleep apnea (adult) (pediatric): Secondary | ICD-10-CM | POA: Diagnosis not present

## 2018-10-09 NOTE — Telephone Encounter (Signed)
Clonidine rx was sent 09/10/18. Amlodipine was sent today. See meds. Pt informed.

## 2019-01-02 ENCOUNTER — Ambulatory Visit (INDEPENDENT_AMBULATORY_CARE_PROVIDER_SITE_OTHER): Payer: Medicare Other | Admitting: Internal Medicine

## 2019-01-02 ENCOUNTER — Other Ambulatory Visit: Payer: Self-pay

## 2019-01-02 ENCOUNTER — Encounter: Payer: Self-pay | Admitting: Internal Medicine

## 2019-01-02 ENCOUNTER — Other Ambulatory Visit (INDEPENDENT_AMBULATORY_CARE_PROVIDER_SITE_OTHER): Payer: Medicare Other

## 2019-01-02 DIAGNOSIS — M545 Low back pain: Secondary | ICD-10-CM

## 2019-01-02 DIAGNOSIS — N320 Bladder-neck obstruction: Secondary | ICD-10-CM

## 2019-01-02 DIAGNOSIS — C61 Malignant neoplasm of prostate: Secondary | ICD-10-CM | POA: Diagnosis not present

## 2019-01-02 DIAGNOSIS — I10 Essential (primary) hypertension: Secondary | ICD-10-CM

## 2019-01-02 DIAGNOSIS — R350 Frequency of micturition: Secondary | ICD-10-CM

## 2019-01-02 DIAGNOSIS — E785 Hyperlipidemia, unspecified: Secondary | ICD-10-CM

## 2019-01-02 DIAGNOSIS — M79604 Pain in right leg: Secondary | ICD-10-CM

## 2019-01-02 LAB — HEPATIC FUNCTION PANEL
ALT: 33 U/L (ref 0–53)
AST: 23 U/L (ref 0–37)
Albumin: 4 g/dL (ref 3.5–5.2)
Alkaline Phosphatase: 99 U/L (ref 39–117)
Bilirubin, Direct: 0.1 mg/dL (ref 0.0–0.3)
Total Bilirubin: 0.5 mg/dL (ref 0.2–1.2)
Total Protein: 6.8 g/dL (ref 6.0–8.3)

## 2019-01-02 LAB — BASIC METABOLIC PANEL
BUN: 12 mg/dL (ref 6–23)
CO2: 31 mEq/L (ref 19–32)
Calcium: 8.8 mg/dL (ref 8.4–10.5)
Chloride: 103 mEq/L (ref 96–112)
Creatinine, Ser: 1.11 mg/dL (ref 0.40–1.50)
GFR: 77.8 mL/min (ref >60.00)
Glucose, Bld: 112 mg/dL — ABNORMAL HIGH (ref 70–99)
Potassium: 3.3 mEq/L — ABNORMAL LOW (ref 3.5–5.1)
Sodium: 139 mEq/L (ref 135–145)

## 2019-01-02 NOTE — Assessment & Plan Note (Signed)
On Lupron

## 2019-01-02 NOTE — Assessment & Plan Note (Signed)
On Lovastatin

## 2019-01-02 NOTE — Assessment & Plan Note (Signed)
Flomax per Urology

## 2019-01-02 NOTE — Assessment & Plan Note (Signed)
No relapse 

## 2019-01-02 NOTE — Progress Notes (Signed)
Subjective:  Patient ID: Jeffrey Carter, male    DOB: 1940-12-24  Age: 78 y.o. MRN: [id number]  CC: No chief complaint on file.   HPI THALMUS BELOTTI presents for HTN, prostate cancer, dyslipidemia f/u  Outpatient Medications Prior to Visit  Medication Sig Dispense Refill  . acetaminophen (TYLENOL) 325 MG tablet Take 2 tablets (650 mg total) by mouth every 8 (eight) hours. 30 tablet 0  . amLODipine (NORVASC) 5 MG tablet Take 1 tablet by mouth once daily 90 tablet 0  . cloNIDine (CATAPRES) 0.3 MG tablet Take 1 tablet by mouth twice daily 180 tablet 1  . EQ ALLERGY RELIEF 10 MG tablet TAKE ONE TABLET BY MOUTH ONCE DAILY 90 tablet 3  . fluticasone (FLONASE) 50 MCG/ACT nasal spray Place 2 sprays into both nostrils daily. (Patient taking differently: Place 2 sprays into both nostrils daily as needed for allergies. ) 16 g 2  . furosemide (LASIX) 40 MG tablet Take 1 tablet (40 mg total) by mouth daily. 90 tablet 3  . HYDROcodone-acetaminophen (NORCO) 7.5-325 MG tablet Take 1 tablet by mouth 4 (four) times daily as needed for moderate pain. 30 tablet 0  . indomethacin (INDOCIN) 50 MG capsule Take 1 capsule (50 mg total) by mouth 3 (three) times daily as needed. For gout attack 60 capsule 1  . losartan (COZAAR) 100 MG tablet Take 1 tablet by mouth once daily 90 tablet 1  . lovastatin (MEVACOR) 40 MG tablet TAKE 1 TABLET BY MOUTH ONCE DAILY AT 6PM 90 tablet 3  . methocarbamol (ROBAXIN) 500 MG tablet Take 1 tablet (500 mg total) by mouth 2 (two) times daily. 20 tablet 0  . methylPREDNISolone (MEDROL DOSEPAK) 4 MG TBPK tablet As directed for gout 21 tablet 0  . potassium chloride SA (K-DUR) 20 MEQ tablet Take 1 tablet (20 mEq total) by mouth daily. 30 tablet 11  . sildenafil (VIAGRA) 100 MG tablet Take 1 tablet (100 mg total) by mouth as needed for erectile dysfunction. 12 tablet 5  . tamsulosin (FLOMAX) 0.4 MG CAPS capsule TAKE 1 CAPSULE BY MOUTH  DAILY 90 capsule 1   Facility-Administered  Medications Prior to Visit  Medication Dose Route Frequency Provider Last Rate Last Dose  . 0.9 %  sodium chloride infusion  500 mL Intravenous Continuous Gatha Mayer, MD        ROS: Review of Systems  Constitutional: Negative for appetite change, fatigue and unexpected weight change.  HENT: Negative for congestion, nosebleeds, sneezing, sore throat and trouble swallowing.   Eyes: Negative for itching and visual disturbance.  Respiratory: Negative for cough.   Cardiovascular: Negative for chest pain, palpitations and leg swelling.  Gastrointestinal: Negative for abdominal distention, blood in stool, diarrhea and nausea.  Genitourinary: Negative for frequency and hematuria.  Musculoskeletal: Positive for arthralgias. Negative for back pain, gait problem, joint swelling and neck pain.  Skin: Negative for rash.  Neurological: Negative for dizziness, tremors, speech difficulty and weakness.  Psychiatric/Behavioral: Negative for agitation, dysphoric mood and sleep disturbance. The patient is not nervous/anxious.     Objective:  BP (!) 150/92 (BP Location: Left Arm, Patient Position: Sitting, Cuff Size: Large)   Pulse 76   Temp 98 F (36.7 C) (Oral)   Ht 5\' 9"  (1.753 m)   Wt 257 lb (116.6 kg)   SpO2 96%   BMI 37.95 kg/m   BP Readings from Last 3 Encounters:  01/02/19 (!) 150/92  10/03/18 132/84  08/09/18 (!) 145/86    Wt  Readings from Last 3 Encounters:  01/02/19 257 lb (116.6 kg)  10/03/18 257 lb (116.6 kg)  08/09/18 254 lb 9.6 oz (115.5 kg)    Physical Exam Constitutional:      General: He is not in acute distress.    Appearance: He is well-developed. He is obese.     Comments: NAD  Eyes:     Conjunctiva/sclera: Conjunctivae normal.     Pupils: Pupils are equal, round, and reactive to light.  Neck:     Musculoskeletal: Normal range of motion.     Thyroid: No thyromegaly.     Vascular: No JVD.  Cardiovascular:     Rate and Rhythm: Normal rate and regular rhythm.      Heart sounds: Normal heart sounds. No murmur. No friction rub. No gallop.   Pulmonary:     Effort: Pulmonary effort is normal. No respiratory distress.     Breath sounds: Normal breath sounds. No wheezing or rales.  Chest:     Chest wall: No tenderness.  Abdominal:     General: Bowel sounds are normal. There is no distension.     Palpations: Abdomen is soft. There is no mass.     Tenderness: There is no abdominal tenderness. There is no guarding or rebound.  Musculoskeletal: Normal range of motion.        General: No tenderness.  Lymphadenopathy:     Cervical: No cervical adenopathy.  Skin:    General: Skin is warm and dry.     Findings: No rash.  Neurological:     Mental Status: He is alert and oriented to person, place, and time.     Cranial Nerves: No cranial nerve deficit.     Motor: No abnormal muscle tone.     Coordination: Coordination normal.     Gait: Gait normal.     Deep Tendon Reflexes: Reflexes are normal and symmetric.  Psychiatric:        Behavior: Behavior normal.        Thought Content: Thought content normal.        Judgment: Judgment normal.     Lab Results  Component Value Date   WBC 4.5 11/29/2017   HGB 13.0 11/29/2017   HCT 38.3 (L) 11/29/2017   PLT 199.0 11/29/2017   GLUCOSE 105 (H) 10/03/2018   CHOL 136 11/29/2017   TRIG 145.0 11/29/2017   HDL 38.30 (L) 11/29/2017   LDLCALC 69 11/29/2017   ALT 27 05/31/2018   AST 18 05/31/2018   NA 139 10/03/2018   K 3.5 10/03/2018   CL 104 10/03/2018   CREATININE 1.02 10/03/2018   BUN 11 10/03/2018   CO2 28 10/03/2018   TSH 1.70 11/29/2017   PSA 5.25 (H) 12/11/2013   INR 1.01 10/12/2014    No results found.  Assessment & Plan:   There are no diagnoses linked to this encounter.   No orders of the defined types were placed in this encounter.    Follow-up: No follow-ups on file.  [name], MD

## 2019-01-02 NOTE — Assessment & Plan Note (Signed)
:   Amlodipine, Catapress, Furosemide, Losartan °

## 2019-01-02 NOTE — Assessment & Plan Note (Signed)
-   Flomax 

## 2019-01-05 ENCOUNTER — Other Ambulatory Visit: Payer: Self-pay | Admitting: Internal Medicine

## 2019-02-20 ENCOUNTER — Ambulatory Visit (INDEPENDENT_AMBULATORY_CARE_PROVIDER_SITE_OTHER): Payer: Medicare Other | Admitting: Internal Medicine

## 2019-02-20 ENCOUNTER — Encounter: Payer: Self-pay | Admitting: Internal Medicine

## 2019-02-20 DIAGNOSIS — J399 Disease of upper respiratory tract, unspecified: Secondary | ICD-10-CM | POA: Insufficient documentation

## 2019-02-20 NOTE — Progress Notes (Signed)
Virtual Visit via Telephone Note  I connected with Jeffrey Carter on 02/20/19 at  2:00 PM EST by telephone and verified that I am speaking with the correct person using two identifiers.   I discussed the limitations, risks, security and privacy concerns of performing an evaluation and management service by telephone and the availability of in person appointments. I also discussed with the patient that there may be a patient responsible charge related to this service. The patient expressed understanding and agreed to proceed.   History of Present Illness:   C/o URI sx's x 2 d No fever, no SOB C/o congestion, dry cough. Slept well. Pt and wife were tested for COVID last night - no results yet  Observations/Objective:  Sounds hoarse  Assessment and Plan:  See plan Follow Up Instructions:    I discussed the assessment and treatment plan with the patient. The patient was provided an opportunity to ask questions and all were answered. The patient agreed with the plan and demonstrated an understanding of the instructions.   The patient was advised to call back or seek an in-person evaluation if the symptoms worsen or if the condition fails to improve as anticipated.  I provided 11 minutes of non-face-to-face time during this encounter.   [name], MD

## 2019-02-20 NOTE — Assessment & Plan Note (Signed)
Viral Pt and wife were tested for COVID last night - no results yet Call if issues OTC cold meds

## 2019-03-04 ENCOUNTER — Ambulatory Visit: Payer: Medicare Other

## 2019-03-05 ENCOUNTER — Other Ambulatory Visit: Payer: Self-pay | Admitting: Internal Medicine

## 2019-03-21 ENCOUNTER — Ambulatory Visit: Payer: Medicare Other | Attending: Internal Medicine

## 2019-03-21 DIAGNOSIS — Z23 Encounter for immunization: Secondary | ICD-10-CM

## 2019-03-21 NOTE — Progress Notes (Signed)
   Covid-19 Vaccination Clinic  Name:  Jeffrey Carter    MRN: [id number] DOB: 29-Apr-1940  03/21/2019  [name] was observed post Covid-19 immunization for 15 minutes without incidence. He was provided with Vaccine Information Sheet and instruction to access the V-Safe system.   [name] was instructed to call 911 with any severe reactions post vaccine: Marland Kitchen Difficulty breathing  . Swelling of your face and throat  . A fast heartbeat  . A bad rash all over your body  . Dizziness and weakness    Immunizations Administered    Name Date Dose VIS Date Route   Pfizer COVID-19 Vaccine 03/21/2019  3:15 PM 0.3 mL 01/04/2019 Intramuscular   Manufacturer: [place]   Lot: [id number]   [place]: [id number]

## 2019-04-02 ENCOUNTER — Other Ambulatory Visit: Payer: Self-pay

## 2019-04-02 ENCOUNTER — Ambulatory Visit (INDEPENDENT_AMBULATORY_CARE_PROVIDER_SITE_OTHER): Payer: Medicare Other | Admitting: Internal Medicine

## 2019-04-02 ENCOUNTER — Encounter: Payer: Self-pay | Admitting: Internal Medicine

## 2019-04-02 DIAGNOSIS — N320 Bladder-neck obstruction: Secondary | ICD-10-CM | POA: Diagnosis not present

## 2019-04-02 DIAGNOSIS — I10 Essential (primary) hypertension: Secondary | ICD-10-CM

## 2019-04-02 DIAGNOSIS — M545 Low back pain, unspecified: Secondary | ICD-10-CM

## 2019-04-02 DIAGNOSIS — C61 Malignant neoplasm of prostate: Secondary | ICD-10-CM | POA: Diagnosis not present

## 2019-04-02 DIAGNOSIS — E785 Hyperlipidemia, unspecified: Secondary | ICD-10-CM

## 2019-04-02 LAB — BASIC METABOLIC PANEL WITH GFR
BUN: 10 mg/dL (ref 6–23)
CO2: 29 meq/L (ref 19–32)
Calcium: 9.1 mg/dL (ref 8.4–10.5)
Chloride: 104 meq/L (ref 96–112)
Creatinine, Ser: 0.98 mg/dL (ref 0.40–1.50)
GFR: 89.77 mL/min
Glucose, Bld: 114 mg/dL — ABNORMAL HIGH (ref 70–99)
Potassium: 3.7 meq/L (ref 3.5–5.1)
Sodium: 138 meq/L (ref 135–145)

## 2019-04-02 NOTE — Assessment & Plan Note (Signed)
Better  

## 2019-04-02 NOTE — Addendum Note (Signed)
Addended by: Trenda Moots on: 0000000 02:22 PM   Modules accepted: Orders

## 2019-04-02 NOTE — Assessment & Plan Note (Signed)
BP Readings from Last 3 Encounters:  04/02/19 (!) 148/92  01/02/19 (!) 150/92  10/03/18 132/84

## 2019-04-02 NOTE — Assessment & Plan Note (Signed)
Lovastatin 

## 2019-04-02 NOTE — Assessment & Plan Note (Signed)
Doing well - no LBP

## 2019-04-02 NOTE — Progress Notes (Signed)
Subjective:  Patient ID: Jeffrey Carter, male    DOB: [birth year]  Age: 79 y.o. MRN: [id number]  CC: No chief complaint on file.   HPI Jeffrey Carter presents for HTN, dyslipidemia, gout f/u. Lost wt on diet  Outpatient Medications Prior to Visit  Medication Sig Dispense Refill  . acetaminophen (TYLENOL) 325 MG tablet Take 2 tablets (650 mg total) by mouth every 8 (eight) hours. 30 tablet 0  . amLODipine (NORVASC) 5 MG tablet Take 1 tablet by mouth once daily 90 tablet 3  . cloNIDine (CATAPRES) 0.3 MG tablet Take 1 tablet by mouth twice daily 180 tablet 0  . EQ ALLERGY RELIEF 10 MG tablet TAKE ONE TABLET BY MOUTH ONCE DAILY 90 tablet 3  . fluticasone (FLONASE) 50 MCG/ACT nasal spray Place 2 sprays into both nostrils daily. (Patient taking differently: Place 2 sprays into both nostrils daily as needed for allergies. ) 16 g 2  . furosemide (LASIX) 40 MG tablet Take 1 tablet (40 mg total) by mouth daily. 90 tablet 3  . HYDROcodone-acetaminophen (NORCO) 7.5-325 MG tablet Take 1 tablet by mouth 4 (four) times daily as needed for moderate pain. 30 tablet 0  . indomethacin (INDOCIN) 50 MG capsule Take 1 capsule (50 mg total) by mouth 3 (three) times daily as needed. For gout attack 60 capsule 1  . losartan (COZAAR) 100 MG tablet Take 1 tablet by mouth once daily 90 tablet 0  . lovastatin (MEVACOR) 40 MG tablet TAKE 1 TABLET BY MOUTH ONCE DAILY AT 6PM 90 tablet 3  . methocarbamol (ROBAXIN) 500 MG tablet Take 1 tablet (500 mg total) by mouth 2 (two) times daily. 20 tablet 0  . potassium chloride SA (K-DUR) 20 MEQ tablet Take 1 tablet (20 mEq total) by mouth daily. 30 tablet 11  . sildenafil (VIAGRA) 100 MG tablet Take 1 tablet (100 mg total) by mouth as needed for erectile dysfunction. 12 tablet 5  . tamsulosin (FLOMAX) 0.4 MG CAPS capsule TAKE 1 CAPSULE BY MOUTH  DAILY 90 capsule 1  . methylPREDNISolone (MEDROL DOSEPAK) 4 MG TBPK tablet As directed for gout 21 tablet 0   Facility-Administered  Medications Prior to Visit  Medication Dose Route Frequency Provider Last Rate Last Admin  . 0.9 %  sodium chloride infusion  500 mL Intravenous Continuous Gatha Mayer, MD        ROS: Review of Systems  Constitutional: Negative for appetite change and fatigue.  HENT: Negative for congestion, nosebleeds, sneezing, sore throat and trouble swallowing.   Eyes: Negative for itching and visual disturbance.  Respiratory: Negative for cough.   Cardiovascular: Negative for chest pain, palpitations and leg swelling.  Gastrointestinal: Negative for abdominal distention, blood in stool, diarrhea and nausea.  Genitourinary: Negative for frequency and hematuria.  Musculoskeletal: Positive for arthralgias. Negative for back pain, gait problem, joint swelling and neck pain.  Skin: Negative for rash.  Neurological: Negative for dizziness, tremors, speech difficulty and weakness.  Psychiatric/Behavioral: Negative for agitation, dysphoric mood, sleep disturbance and suicidal ideas. The patient is not nervous/anxious.     Objective:  BP (!) 148/92 (BP Location: Left Arm, Patient Position: Sitting, Cuff Size: Large)   Pulse 83   Temp 97.9 F (36.6 C) (Oral)   Ht 5\' 9"  (1.753 m)   Wt 242 lb (109.8 kg)   SpO2 96%   BMI 35.74 kg/m   BP Readings from Last 3 Encounters:  04/02/19 (!) 148/92  01/02/19 (!) 150/92  10/03/18 132/84  Wt Readings from Last 3 Encounters:  04/02/19 242 lb (109.8 kg)  01/02/19 257 lb (116.6 kg)  10/03/18 257 lb (116.6 kg)    Physical Exam Constitutional:      General: He is not in acute distress.    Appearance: He is well-developed. He is obese.     Comments: NAD  Eyes:     Conjunctiva/sclera: Conjunctivae normal.     Pupils: Pupils are equal, round, and reactive to light.  Neck:     Thyroid: No thyromegaly.     Vascular: No JVD.  Cardiovascular:     Rate and Rhythm: Normal rate and regular rhythm.     Heart sounds: Normal heart sounds. No murmur. No  friction rub. No gallop.   Pulmonary:     Effort: Pulmonary effort is normal. No respiratory distress.     Breath sounds: Normal breath sounds. No wheezing or rales.  Chest:     Chest wall: No tenderness.  Abdominal:     General: Bowel sounds are normal. There is no distension.     Palpations: Abdomen is soft. There is no mass.     Tenderness: There is no abdominal tenderness. There is no guarding or rebound.  Musculoskeletal:        General: No tenderness. Normal range of motion.     Cervical back: Normal range of motion.  Lymphadenopathy:     Cervical: No cervical adenopathy.  Skin:    General: Skin is warm and dry.     Findings: No rash.  Neurological:     Mental Status: He is alert and oriented to person, place, and time.     Cranial Nerves: No cranial nerve deficit.     Motor: No abnormal muscle tone.     Coordination: Coordination normal.     Gait: Gait normal.     Deep Tendon Reflexes: Reflexes are normal and symmetric.  Psychiatric:        Behavior: Behavior normal.        Thought Content: Thought content normal.        Judgment: Judgment normal.     Lab Results  Component Value Date   WBC 4.5 11/29/2017   HGB 13.0 11/29/2017   HCT 38.3 (L) 11/29/2017   PLT 199.0 11/29/2017   GLUCOSE 112 (H) 01/02/2019   CHOL 136 11/29/2017   TRIG 145.0 11/29/2017   HDL 38.30 (L) 11/29/2017   LDLCALC 69 11/29/2017   ALT 33 01/02/2019   AST 23 01/02/2019   NA 139 01/02/2019   K 3.3 (L) 01/02/2019   CL 103 01/02/2019   CREATININE 1.11 01/02/2019   BUN 12 01/02/2019   CO2 31 01/02/2019   TSH 1.70 11/29/2017   PSA 5.25 (H) 12/11/2013   INR 1.01 10/12/2014    No results found.  Assessment & Plan:   There are no diagnoses linked to this encounter.   No orders of the defined types were placed in this encounter.    Follow-up: No follow-ups on file.  [name], MD

## 2019-04-02 NOTE — Assessment & Plan Note (Addendum)
F/u w/Dr Jeffie Pollock q 12 mo

## 2019-04-08 DIAGNOSIS — G4733 Obstructive sleep apnea (adult) (pediatric): Secondary | ICD-10-CM | POA: Diagnosis not present

## 2019-04-16 ENCOUNTER — Ambulatory Visit: Payer: Medicare Other | Attending: Internal Medicine

## 2019-04-16 DIAGNOSIS — Z23 Encounter for immunization: Secondary | ICD-10-CM

## 2019-04-16 NOTE — Progress Notes (Signed)
   Covid-19 Vaccination Clinic  Name:  CHANTZ LAHAIE    MRN: [id number] DOB: 25-Jun-1940  04/16/2019  Mr. Feinstein was observed post Covid-19 immunization for 15 minutes without incident. He was provided with Vaccine Information Sheet and instruction to access the V-Safe system.   Mr. Quintos was instructed to call 911 with any severe reactions post vaccine: Marland Kitchen Difficulty breathing  . Swelling of face and throat  . A fast heartbeat  . A bad rash all over body  . Dizziness and weakness   Immunizations Administered    Name Date Dose VIS Date Route   Pfizer COVID-19 Vaccine 04/16/2019  2:06 PM 0.3 mL 01/04/2019 Intramuscular   Manufacturer: [place]   Lot: [id number]   [place]: [id number]

## 2019-05-29 ENCOUNTER — Other Ambulatory Visit: Payer: Self-pay | Admitting: Internal Medicine

## 2019-06-07 ENCOUNTER — Other Ambulatory Visit: Payer: Self-pay

## 2019-06-07 ENCOUNTER — Encounter: Payer: Self-pay | Admitting: Family

## 2019-06-07 ENCOUNTER — Ambulatory Visit (INDEPENDENT_AMBULATORY_CARE_PROVIDER_SITE_OTHER): Payer: Medicare Other | Admitting: Family

## 2019-06-07 ENCOUNTER — Ambulatory Visit (INDEPENDENT_AMBULATORY_CARE_PROVIDER_SITE_OTHER)
Admission: RE | Admit: 2019-06-07 | Discharge: 2019-06-07 | Disposition: A | Discharge status: Home / Self Care | Payer: Medicare Other | Source: Ambulatory Visit | Attending: Family | Admitting: Family

## 2019-06-07 VITALS — BP 138/74 | HR 79 | Temp 98.2°F | Ht 69.0 in | Wt 244.0 lb

## 2019-06-07 DIAGNOSIS — M19072 Primary osteoarthritis, left ankle and foot: Secondary | ICD-10-CM | POA: Diagnosis not present

## 2019-06-07 DIAGNOSIS — M79672 Pain in left foot: Secondary | ICD-10-CM

## 2019-06-07 MED ORDER — MELOXICAM 15 MG PO TABS
15.0000 mg | ORAL_TABLET | Freq: Every day | ORAL | 0 refills | Status: DC
Start: 2019-06-07 — End: 2020-11-05

## 2019-06-07 NOTE — Progress Notes (Signed)
Jeffrey Carter is a 79 y.o. male with the following history as recorded in EpicCare:  Patient Active Problem List   Diagnosis Date Noted  . Upper respiratory disease 02/20/2019  . Exposure to blood or body fluid 10/03/2018  . RUQ pain 04/25/2017  . Hx of adenomatous colonic polyps 07/07/2016  . Gout attack 06/08/2016  . Foot pain, left 06/08/2016  . Hypokalemia 04/21/2016  . 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 Dysuria 01/11/2016  . PAC (premature atrial contraction) 06/18/2015  . Acute sinusitis 12/31/2014  . Urination frequency 09/15/2014  . Cerumen impaction 07/14/2014  . Prostate cancer ([place]) 04/14/2014  . Low back pain radiating to right lower extremity 03/13/2014  . Mass of ear canal 01/29/2014  . Otitis, externa, infective 01/22/2014  . Edema 07/05/2013  . Erectile dysfunction 07/05/2013  . Gout of big toe 03/23/2013  . OSA (obstructive sleep apnea) 04/18/2012  . Well adult exam 06/02/2011  . 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  . 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 Dyslipidemia 10/27/2006  . Essential hypertension 09/09/2006    Current Outpatient Medications  Medication Sig Dispense Refill  . acetaminophen (TYLENOL) 325 MG tablet Take 2 tablets (650 mg total) by mouth every 8 (eight) hours. 30 tablet 0  . amLODipine (NORVASC) 5 MG tablet Take 1 tablet by mouth once daily 90 tablet 3  . cloNIDine (CATAPRES) 0.3 MG tablet Take 1 tablet by mouth twice daily 180 tablet 1  . EQ ALLERGY RELIEF 10 MG tablet TAKE ONE TABLET BY MOUTH ONCE DAILY 90 tablet 3  . fluticasone (FLONASE) 50 MCG/ACT nasal spray Place 2 sprays into both nostrils daily. (Patient taking differently: Place 2 sprays into both nostrils daily as needed for allergies. ) 16 g 2  . furosemide (LASIX) 40 MG tablet Take 1 tablet by mouth once daily 90 tablet 1  . losartan (COZAAR) 100 MG tablet Take 1 tablet by mouth once daily 90 tablet 1  . lovastatin (MEVACOR) 40 MG tablet TAKE 1 TABLET BY MOUTH  ONCE DAILY AT 6PM 90 tablet 1  . Potassium Chloride ER 20 MEQ TBCR Take 1 tablet by mouth once daily 90 tablet 1  . sildenafil (VIAGRA) 100 MG tablet Take 1 tablet (100 mg total) by mouth as needed for erectile dysfunction. 12 tablet 5  . tamsulosin (FLOMAX) 0.4 MG CAPS capsule TAKE 1 CAPSULE BY MOUTH  DAILY (Patient taking differently: Take 0.4 mg by mouth 2 (two) times daily. ) 90 capsule 1  . meloxicam (MOBIC) 15 MG tablet Take 1 tablet (15 mg total) by mouth daily. 30 tablet 0   No current facility-administered medications for this visit.    Allergies: Percocet [oxycodone-acetaminophen]  Past Medical History:  Diagnosis Date  . Bladder outlet obstruction   . BPH (benign prostatic hyperplasia)   . Conjunctivitis, acute, bilateral    07-02-2015  per pcp note and started antibiotic drops  . First degree heart block   . History of acute conjunctivitis    07-02-2015  resolved after round of antibiotic eye drops  . History of gout    BIG TOE  . History of urinary retention 12/2014  . Hx of adenomatous colonic polyps 07/07/2016  . Hyperlipidemia   . Hypertension   . Lower urinary tract symptoms (LUTS)   . OSA on CPAP    MODERATE PER STUDY 03-08-2012  . Osteoarthritis   . PAC (premature atrial contraction)   . Prostate cancer [facility]) urologist-  dr budzyn/  oncologist-  dr Ander Slade  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),  Gleason 3+4,  PSA 10.57,  vol 155ml //   External beam radiation therapy  08-27-2014 to 10-22-2014  . S/P radiation therapy 08-27-2014  to  10-22-2014   prostate 7800Gy in 40 sessions and seminal vesicals 5000Gy in 40 sessions  . Wears glasses     Past Surgical History:  Procedure Laterality Date  . APPENDECTOMY  1966  . COLONOSCOPY  07-06-2006  . GOLD SEED IMPLANT N/A 08/12/2014   Procedure: GOLD SEED IMPLANT;  Surgeon: Lowella Bandy, MD;  Location: [facility];  Service: Urology;  Laterality: N/A;  . GREEN LIGHT LASER TURP (TRANSURETHRAL RESECTION OF PROSTATE  N/A 07/20/2015   Procedure: GREEN LIGHT LASER ABLATION OF PROSTATE ;  Surgeon: Nickie Retort, MD;  Location: [facility];  Service: Urology;  Laterality: N/A;  . PROSTATE BIOPSY N/A 03/28/2014   Procedure: BIOPSY TRANSRECTAL ULTRASONIC PROSTATE (TUBP);  Surgeon: Arvil Persons, MD;  Location: [facility];  Service: Urology;  Laterality: N/A;  . TONSILLECTOMY  as child  . TOTAL KNEE ARTHROPLASTY Right 06-22-2007    Family History  Problem Relation Age of Onset  . Diabetes Father   . Cancer Brother        pancreatic, stomach  . Colon cancer Neg Hx     Social History   Tobacco Use  . Smoking status: Former Smoker    Packs/day: 0.25    Years: 8.00    Pack years: 2.00    Types: Cigarettes    Quit date: 01/24/1962    Years since quitting: 57.4  . Smokeless tobacco: Never Used  Substance Use Topics  . Alcohol use: No    Subjective:  Patient is accompanied by his wife; complaining of left heel pain x 4-5 days; no known injury or trauma; pain most noticeable when he steps down on his heel; has not taken any medication.    Objective:  Vitals:   06/07/19 1043  BP: 138/74  Pulse: 79  Temp: 98.2 F (36.8 C)  TempSrc: Oral  SpO2: 96%  Weight: 244 lb (110.7 kg)  Height: 5\' 9"  (1.753 m)    General: Well developed, well nourished, in no acute distress  Skin : Warm and dry.  Head: Normocephalic and atraumatic  Lungs: Respirations unlabored;  Musculoskeletal: No deformities; no active joint inflammation  Extremities: No edema, cyanosis, clubbing  Vessels: Symmetric bilaterally  Neurologic: Alert and oriented; speech intact; face symmetrical; moves all extremities well; CNII-XII intact without focal deficit   Assessment:  1. Pain of left heel     Plan:  ? Spur vs plantar fasciitis; update X-ray today; Rx for Mobic 15 mg daily; may need to see podiatrist;  This visit occurred during the SARS-CoV-2 public health emergency.  Safety protocols were in  place, including screening questions prior to the visit, additional usage of staff PPE, and extensive cleaning of exam room while observing appropriate contact time as indicated for disinfecting solutions.     No follow-ups on file.  Orders Placed This Encounter  Procedures  . DG Foot Complete Left    Standing Status:   Future    Number of Occurrences:   1    Standing Expiration Date:   08/06/2020    Order Specific Question:   Reason for Exam (SYMPTOM  OR DIAGNOSIS REQUIRED)    Answer:   left heel pain    Order Specific Question:   Preferred imaging location?    Answer:   [address]    Order Specific Question:   Radiology Contrast Protocol - do NOT remove file path    Answer:   [email or web address]    Requested Prescriptions   Signed Prescriptions Disp Refills  . meloxicam (MOBIC) 15 MG tablet 30 tablet 0    Sig: Take 1 tablet (15 mg total) by mouth daily.

## 2019-06-10 ENCOUNTER — Other Ambulatory Visit: Payer: Self-pay | Admitting: Family

## 2019-06-10 DIAGNOSIS — M79672 Pain in left foot: Secondary | ICD-10-CM

## 2019-06-19 ENCOUNTER — Encounter: Payer: Self-pay | Admitting: Podiatry

## 2019-06-19 ENCOUNTER — Ambulatory Visit (INDEPENDENT_AMBULATORY_CARE_PROVIDER_SITE_OTHER): Payer: Medicare Other

## 2019-06-19 ENCOUNTER — Other Ambulatory Visit: Payer: Self-pay | Admitting: Podiatry

## 2019-06-19 ENCOUNTER — Ambulatory Visit: Payer: Medicare Other | Admitting: Podiatry

## 2019-06-19 ENCOUNTER — Other Ambulatory Visit: Payer: Self-pay

## 2019-06-19 DIAGNOSIS — M722 Plantar fascial fibromatosis: Secondary | ICD-10-CM | POA: Diagnosis not present

## 2019-06-19 DIAGNOSIS — M79672 Pain in left foot: Secondary | ICD-10-CM

## 2019-07-01 ENCOUNTER — Ambulatory Visit: Payer: Medicare Other

## 2019-07-03 ENCOUNTER — Ambulatory Visit (INDEPENDENT_AMBULATORY_CARE_PROVIDER_SITE_OTHER): Payer: Medicare Other | Admitting: Internal Medicine

## 2019-07-03 ENCOUNTER — Other Ambulatory Visit: Payer: Self-pay

## 2019-07-03 ENCOUNTER — Encounter: Payer: Self-pay | Admitting: Internal Medicine

## 2019-07-03 VITALS — BP 132/90 | HR 77 | Temp 99.0°F | Ht 69.0 in | Wt 237.4 lb

## 2019-07-03 DIAGNOSIS — N320 Bladder-neck obstruction: Secondary | ICD-10-CM

## 2019-07-03 DIAGNOSIS — E785 Hyperlipidemia, unspecified: Secondary | ICD-10-CM | POA: Diagnosis not present

## 2019-07-03 DIAGNOSIS — M79672 Pain in left foot: Secondary | ICD-10-CM

## 2019-07-03 DIAGNOSIS — I10 Essential (primary) hypertension: Secondary | ICD-10-CM

## 2019-07-03 DIAGNOSIS — R600 Localized edema: Secondary | ICD-10-CM | POA: Diagnosis not present

## 2019-07-03 NOTE — Assessment & Plan Note (Addendum)
A little better -  L heel pain - on Meloxicam, had a steroid shot on 5/26 F/u w/Dr Paulla Dolly

## 2019-07-03 NOTE — Assessment & Plan Note (Signed)
Better  

## 2019-07-03 NOTE — Assessment & Plan Note (Signed)
Amlodipine, Catapress, Furosemide, Losartan

## 2019-07-03 NOTE — Assessment & Plan Note (Signed)
Lovastatin 

## 2019-07-03 NOTE — Progress Notes (Signed)
Subjective:  Patient ID: Jeffrey Carter, male    DOB: 28-Dec-1940  Age: 79 y.o. MRN: [id number]  CC: No chief complaint on file.   HPI HARLAND AGUINIGA presents for L heel pain - on Meloxicam, had a steroid shot on 5/26 F/u HTN, BPH, dyslipidemia  Outpatient Medications Prior to Visit  Medication Sig Dispense Refill  . acetaminophen (TYLENOL) 325 MG tablet Take 2 tablets (650 mg total) by mouth every 8 (eight) hours. 30 tablet 0  . amLODipine (NORVASC) 5 MG tablet Take 1 tablet by mouth once daily 90 tablet 3  . cloNIDine (CATAPRES) 0.3 MG tablet Take 1 tablet by mouth twice daily 180 tablet 1  . EQ ALLERGY RELIEF 10 MG tablet TAKE ONE TABLET BY MOUTH ONCE DAILY 90 tablet 3  . fluticasone (FLONASE) 50 MCG/ACT nasal spray Place 2 sprays into both nostrils daily. (Patient taking differently: Place 2 sprays into both nostrils daily as needed for allergies. ) 16 g 2  . furosemide (LASIX) 40 MG tablet Take 1 tablet by mouth once daily 90 tablet 1  . losartan (COZAAR) 100 MG tablet Take 1 tablet by mouth once daily 90 tablet 1  . lovastatin (MEVACOR) 40 MG tablet TAKE 1 TABLET BY MOUTH ONCE DAILY AT 6PM 90 tablet 1  . meloxicam (MOBIC) 15 MG tablet Take 1 tablet (15 mg total) by mouth daily. 30 tablet 0  . Potassium Chloride ER 20 MEQ TBCR Take 1 tablet by mouth once daily 90 tablet 1  . sildenafil (VIAGRA) 100 MG tablet Take 1 tablet (100 mg total) by mouth as needed for erectile dysfunction. 12 tablet 5  . tamsulosin (FLOMAX) 0.4 MG CAPS capsule TAKE 1 CAPSULE BY MOUTH  DAILY (Patient taking differently: Take 0.4 mg by mouth 2 (two) times daily. ) 90 capsule 1   No facility-administered medications prior to visit.    ROS: Review of Systems  Constitutional: Negative for appetite change, fatigue and unexpected weight change.  HENT: Negative for congestion, nosebleeds, sneezing, sore throat and trouble swallowing.   Eyes: Negative for itching and visual disturbance.  Respiratory:  Negative for cough.   Cardiovascular: Negative for chest pain, palpitations and leg swelling.  Gastrointestinal: Negative for abdominal distention, blood in stool, diarrhea and nausea.  Genitourinary: Negative for frequency and hematuria.  Musculoskeletal: Positive for arthralgias and gait problem. Negative for back pain, joint swelling and neck pain.  Skin: Negative for rash.  Neurological: Negative for dizziness, tremors, speech difficulty and weakness.  Psychiatric/Behavioral: Negative for agitation, dysphoric mood and sleep disturbance. The patient is not nervous/anxious.     Objective:  BP 132/90 (BP Location: Left Arm, Patient Position: Sitting, Cuff Size: Large)   Pulse 77   Temp 99 F (37.2 C) (Oral)   Ht 5\' 9"  (1.753 m)   Wt 237 lb 6 oz (107.7 kg)   SpO2 96%   BMI 35.05 kg/m   BP Readings from Last 3 Encounters:  07/03/19 132/90  06/07/19 138/74  04/02/19 (!) 148/92    Wt Readings from Last 3 Encounters:  07/03/19 237 lb 6 oz (107.7 kg)  06/07/19 244 lb (110.7 kg)  04/02/19 242 lb (109.8 kg)    Physical Exam Constitutional:      General: He is not in acute distress.    Appearance: He is well-developed. He is obese.     Comments: NAD  Eyes:     Conjunctiva/sclera: Conjunctivae normal.     Pupils: Pupils are equal, round, and reactive to  light.  Neck:     Thyroid: No thyromegaly.     Vascular: No JVD.  Cardiovascular:     Rate and Rhythm: Normal rate and regular rhythm.     Heart sounds: Normal heart sounds. No murmur. No friction rub. No gallop.   Pulmonary:     Effort: Pulmonary effort is normal. No respiratory distress.     Breath sounds: Normal breath sounds. No wheezing or rales.  Chest:     Chest wall: No tenderness.  Abdominal:     General: Bowel sounds are normal. There is no distension.     Palpations: Abdomen is soft. There is no mass.     Tenderness: There is no abdominal tenderness. There is no guarding or rebound.  Musculoskeletal:         General: Tenderness present. Normal range of motion.     Cervical back: Normal range of motion.  Lymphadenopathy:     Cervical: No cervical adenopathy.  Skin:    General: Skin is warm and dry.     Findings: No rash.  Neurological:     Mental Status: He is alert and oriented to person, place, and time.     Cranial Nerves: No cranial nerve deficit.     Motor: No abnormal muscle tone.     Coordination: Coordination normal.     Gait: Gait normal.     Deep Tendon Reflexes: Reflexes are normal and symmetric.  Psychiatric:        Behavior: Behavior normal.        Thought Content: Thought content normal.        Judgment: Judgment normal.    L heel w/pain   Lab Results  Component Value Date   WBC 4.5 11/29/2017   HGB 13.0 11/29/2017   HCT 38.3 (L) 11/29/2017   PLT 199.0 11/29/2017   GLUCOSE 114 (H) 04/02/2019   CHOL 136 11/29/2017   TRIG 145.0 11/29/2017   HDL 38.30 (L) 11/29/2017   LDLCALC 69 11/29/2017   ALT 33 01/02/2019   AST 23 01/02/2019   NA 138 04/02/2019   K 3.7 04/02/2019   CL 104 04/02/2019   CREATININE 0.98 04/02/2019   BUN 10 04/02/2019   CO2 29 04/02/2019   TSH 1.70 11/29/2017   PSA 5.25 (H) 12/11/2013   INR 1.01 10/12/2014    DG Foot Complete Left  Result Date: 06/07/2019 CLINICAL DATA:  Left heel pain for 1 week common no known injury, initial encounter EXAM: LEFT FOOT - COMPLETE 3+ VIEW COMPARISON:  None. FINDINGS: No acute fracture or dislocation is noted. Calcaneal spurs are seen. No soft tissue abnormality is noted. Mild hallux valgus deformity is seen. IMPRESSION: Degenerative changes without acute abnormality. Electronically Signed   By: Inez Catalina M.D.   On: 06/07/2019 23:03    Assessment & Plan:   There are no diagnoses linked to this encounter.   No orders of the defined types were placed in this encounter.    Follow-up: No follow-ups on file.  [name], MD

## 2019-07-08 DIAGNOSIS — G4733 Obstructive sleep apnea (adult) (pediatric): Secondary | ICD-10-CM | POA: Diagnosis not present

## 2019-09-17 DIAGNOSIS — Z961 Presence of intraocular lens: Secondary | ICD-10-CM | POA: Diagnosis not present

## 2019-09-17 DIAGNOSIS — H35033 Hypertensive retinopathy, bilateral: Secondary | ICD-10-CM | POA: Diagnosis not present

## 2019-09-17 DIAGNOSIS — H3540 Unspecified peripheral retinal degeneration: Secondary | ICD-10-CM | POA: Diagnosis not present

## 2019-10-03 ENCOUNTER — Ambulatory Visit: Payer: Medicare Other | Admitting: Internal Medicine

## 2019-10-07 DIAGNOSIS — G4733 Obstructive sleep apnea (adult) (pediatric): Secondary | ICD-10-CM | POA: Diagnosis not present

## 2019-10-15 ENCOUNTER — Other Ambulatory Visit: Payer: Self-pay

## 2019-10-15 ENCOUNTER — Ambulatory Visit (INDEPENDENT_AMBULATORY_CARE_PROVIDER_SITE_OTHER): Payer: Medicare Other | Admitting: Internal Medicine

## 2019-10-15 ENCOUNTER — Encounter: Payer: Self-pay | Admitting: Internal Medicine

## 2019-10-15 DIAGNOSIS — C61 Malignant neoplasm of prostate: Secondary | ICD-10-CM

## 2019-10-15 DIAGNOSIS — E785 Hyperlipidemia, unspecified: Secondary | ICD-10-CM | POA: Diagnosis not present

## 2019-10-15 DIAGNOSIS — N320 Bladder-neck obstruction: Secondary | ICD-10-CM | POA: Diagnosis not present

## 2019-10-15 DIAGNOSIS — I10 Essential (primary) hypertension: Secondary | ICD-10-CM

## 2019-10-15 DIAGNOSIS — M545 Low back pain, unspecified: Secondary | ICD-10-CM

## 2019-10-15 DIAGNOSIS — R600 Localized edema: Secondary | ICD-10-CM | POA: Diagnosis not present

## 2019-10-15 MED ORDER — HYDROCODONE-ACETAMINOPHEN 7.5-325 MG PO TABS: 1.0000 | ORAL_TABLET | Freq: Four times a day (QID) | ORAL | 0 refills | Status: DC | PRN

## 2019-10-15 NOTE — Addendum Note (Signed)
Addended by: Hazle Quant on: 10/15/2019 11:59 AM   Modules accepted: Orders

## 2019-10-15 NOTE — Assessment & Plan Note (Signed)
Norco prn rare  Potential benefits of a short/long term opioids use as well as potential risks (i.e. addiction risk, apnea etc) and complications (i.e. Somnolence, constipation and others) were explained to the patient and were aknowledged.

## 2019-10-15 NOTE — Addendum Note (Signed)
Addended by: Hazle Quant on: 10/15/2019 12:00 PM   Modules accepted: Orders

## 2019-10-15 NOTE — Progress Notes (Signed)
Subjective:  Patient ID: Jeffrey Carter, male    DOB: [birth year]  Age: 79 y.o. MRN: [id number]  CC: No chief complaint on file.   HPI Jeffrey Carter presents for HTN, OA - pain in feet, prostate cancer f/u  Outpatient Medications Prior to Visit  Medication Sig Dispense Refill  . acetaminophen (TYLENOL) 325 MG tablet Take 2 tablets (650 mg total) by mouth every 8 (eight) hours. 30 tablet 0  . amLODipine (NORVASC) 5 MG tablet Take 1 tablet by mouth once daily 90 tablet 3  . cloNIDine (CATAPRES) 0.3 MG tablet Take 1 tablet by mouth twice daily 180 tablet 1  . EQ ALLERGY RELIEF 10 MG tablet TAKE ONE TABLET BY MOUTH ONCE DAILY 90 tablet 3  . fluticasone (FLONASE) 50 MCG/ACT nasal spray Place 2 sprays into both nostrils daily. (Patient taking differently: Place 2 sprays into both nostrils daily as needed for allergies. ) 16 g 2  . furosemide (LASIX) 40 MG tablet Take 1 tablet by mouth once daily 90 tablet 1  . losartan (COZAAR) 100 MG tablet Take 1 tablet by mouth once daily 90 tablet 1  . lovastatin (MEVACOR) 40 MG tablet TAKE 1 TABLET BY MOUTH ONCE DAILY AT 6PM 90 tablet 1  . meloxicam (MOBIC) 15 MG tablet Take 1 tablet (15 mg total) by mouth daily. 30 tablet 0  . Potassium Chloride ER 20 MEQ TBCR Take 1 tablet by mouth once daily 90 tablet 1  . sildenafil (VIAGRA) 100 MG tablet Take 1 tablet (100 mg total) by mouth as needed for erectile dysfunction. 12 tablet 5  . tamsulosin (FLOMAX) 0.4 MG CAPS capsule TAKE 1 CAPSULE BY MOUTH  DAILY (Patient taking differently: Take 0.4 mg by mouth 2 (two) times daily. ) 90 capsule 1   No facility-administered medications prior to visit.    ROS: Review of Systems  Constitutional: Positive for unexpected weight change. Negative for appetite change and fatigue.  HENT: Negative for congestion, nosebleeds, sneezing, sore throat and trouble swallowing.   Eyes: Negative for itching and visual disturbance.  Respiratory: Negative for cough.     Cardiovascular: Negative for chest pain, palpitations and leg swelling.  Gastrointestinal: Negative for abdominal distention, blood in stool, diarrhea and nausea.  Genitourinary: Negative for frequency and hematuria.  Musculoskeletal: Positive for arthralgias. Negative for back pain, gait problem, joint swelling and neck pain.  Skin: Negative for rash.  Neurological: Negative for dizziness, tremors, speech difficulty and weakness.  Psychiatric/Behavioral: Negative for agitation, dysphoric mood and sleep disturbance. The patient is not nervous/anxious.     Objective:  BP 136/86   Pulse 79   Temp 98.1 F (36.7 C) (Oral)   Ht 5\' 9"  (1.753 m)   Wt 233 lb (105.7 kg)   SpO2 96%   BMI 34.41 kg/m   BP Readings from Last 3 Encounters:  10/15/19 136/86  07/03/19 132/90  06/07/19 138/74    Wt Readings from Last 3 Encounters:  10/15/19 233 lb (105.7 kg)  07/03/19 237 lb 6 oz (107.7 kg)  06/07/19 244 lb (110.7 kg)    Physical Exam Constitutional:      General: He is not in acute distress.    Appearance: He is well-developed.     Comments: NAD  Eyes:     Conjunctiva/sclera: Conjunctivae normal.     Pupils: Pupils are equal, round, and reactive to light.  Neck:     Thyroid: No thyromegaly.     Vascular: No JVD.  Cardiovascular:  Rate and Rhythm: Normal rate and regular rhythm.     Heart sounds: Normal heart sounds. No murmur heard.  No friction rub. No gallop.   Pulmonary:     Effort: Pulmonary effort is normal. No respiratory distress.     Breath sounds: Normal breath sounds. No wheezing or rales.  Chest:     Chest wall: No tenderness.  Abdominal:     General: Bowel sounds are normal. There is no distension.     Palpations: Abdomen is soft. There is no mass.     Tenderness: There is no abdominal tenderness. There is no guarding or rebound.  Musculoskeletal:        General: Tenderness present. Normal range of motion.     Cervical back: Normal range of motion.   Lymphadenopathy:     Cervical: No cervical adenopathy.  Skin:    General: Skin is warm and dry.     Findings: No rash.  Neurological:     Mental Status: He is alert and oriented to person, place, and time.     Cranial Nerves: No cranial nerve deficit.     Motor: No abnormal muscle tone.     Coordination: Coordination normal.     Gait: Gait normal.     Deep Tendon Reflexes: Reflexes are normal and symmetric.  Psychiatric:        Behavior: Behavior normal.        Thought Content: Thought content normal.        Judgment: Judgment normal.    LS spine - stifff   Lab Results  Component Value Date   WBC 4.5 11/29/2017   HGB 13.0 11/29/2017   HCT 38.3 (L) 11/29/2017   PLT 199.0 11/29/2017   GLUCOSE 114 (H) 04/02/2019   CHOL 136 11/29/2017   TRIG 145.0 11/29/2017   HDL 38.30 (L) 11/29/2017   LDLCALC 69 11/29/2017   ALT 33 01/02/2019   AST 23 01/02/2019   NA 138 04/02/2019   K 3.7 04/02/2019   CL 104 04/02/2019   CREATININE 0.98 04/02/2019   BUN 10 04/02/2019   CO2 29 04/02/2019   TSH 1.70 11/29/2017   PSA 5.25 (H) 12/11/2013   INR 1.01 10/12/2014    DG Foot Complete Left  Result Date: 06/07/2019 CLINICAL DATA:  Left heel pain for 1 week common no known injury, initial encounter EXAM: LEFT FOOT - COMPLETE 3+ VIEW COMPARISON:  None. FINDINGS: No acute fracture or dislocation is noted. Calcaneal spurs are seen. No soft tissue abnormality is noted. Mild hallux valgus deformity is seen. IMPRESSION: Degenerative changes without acute abnormality. Electronically Signed   By: Inez Catalina M.D.   On: 06/07/2019 23:03    Assessment & Plan:   There are no diagnoses linked to this encounter.   No orders of the defined types were placed in this encounter.    Follow-up: No follow-ups on file.  [name], MD

## 2019-10-15 NOTE — Assessment & Plan Note (Signed)
BP Readings from Last 3 Encounters:  10/15/19 136/86  07/03/19 132/90  06/07/19 138/74

## 2019-10-15 NOTE — Assessment & Plan Note (Signed)
On Lovastatin 20 mg daily

## 2019-10-15 NOTE — Assessment & Plan Note (Signed)
On Lpron injections; stable

## 2019-10-16 LAB — LIPID PANEL
Cholesterol: 131 mg/dL (ref <200)
HDL: 43 mg/dL (ref >=40)
LDL Cholesterol (Calc): 74 mg/dL (calc)
Non-HDL Cholesterol (Calc): 88 mg/dL (calc) (ref <130)
Total CHOL/HDL Ratio: 3 (calc) (ref <5.0)
Triglycerides: 67 mg/dL (ref <150)

## 2019-10-16 LAB — CBC WITH DIFFERENTIAL/PLATELET
Absolute Monocytes: 656 cells/uL (ref 200–950)
Basophils Absolute: 20 cells/uL (ref 0–200)
Basophils Relative: 0.6 %
Eosinophils Absolute: 143 cells/uL (ref 15–500)
Eosinophils Relative: 4.2 %
HCT: 39.9 % (ref 38.5–50.0)
Hemoglobin: 13.2 g/dL (ref 13.2–17.1)
Lymphs Abs: 1438 cells/uL (ref 850–3900)
MCH: 27.7 pg (ref 27.0–33.0)
MCHC: 33.1 g/dL (ref 32.0–36.0)
MCV: 83.8 fL (ref 80.0–100.0)
MPV: 11.5 fL (ref 7.5–12.5)
Monocytes Relative: 19.3 %
Neutro Abs: 1142 cells/uL — ABNORMAL LOW (ref 1500–7800)
Neutrophils Relative %: 33.6 %
Platelets: 161 10*3/uL (ref 140–400)
RBC: 4.76 10*6/uL (ref 4.20–5.80)
RDW: 13.3 % (ref 11.0–15.0)
Total Lymphocyte: 42.3 %
WBC: 3.4 10*3/uL — ABNORMAL LOW (ref 3.8–10.8)

## 2019-10-16 LAB — BASIC METABOLIC PANEL
BUN: 12 mg/dL (ref 7–25)
CO2: 29 mmol/L (ref 20–32)
Calcium: 8.9 mg/dL (ref 8.6–10.3)
Chloride: 104 mmol/L (ref 98–110)
Creat: 0.94 mg/dL (ref 0.70–1.18)
Glucose, Bld: 107 mg/dL — ABNORMAL HIGH (ref 65–99)
Potassium: 3.9 mmol/L (ref 3.5–5.3)
Sodium: 140 mmol/L (ref 135–146)

## 2019-10-16 LAB — HEPATIC FUNCTION PANEL
AG Ratio: 1.8 (calc) (ref 1.0–2.5)
ALT: 17 U/L (ref 9–46)
AST: 15 U/L (ref 10–35)
Albumin: 4.1 g/dL (ref 3.6–5.1)
Alkaline phosphatase (APISO): 107 U/L (ref 35–144)
Bilirubin, Direct: 0.1 mg/dL (ref 0.0–0.2)
Globulin: 2.3 g/dL (calc) (ref 1.9–3.7)
Indirect Bilirubin: 0.3 mg/dL (calc) (ref 0.2–1.2)
Total Bilirubin: 0.4 mg/dL (ref 0.2–1.2)
Total Protein: 6.4 g/dL (ref 6.1–8.1)

## 2019-10-16 LAB — URINALYSIS
Bilirubin Urine: NEGATIVE
Glucose, UA: NEGATIVE
Hgb urine dipstick: NEGATIVE
Ketones, ur: NEGATIVE
Leukocytes,Ua: NEGATIVE
Nitrite: NEGATIVE
Protein, ur: NEGATIVE
Specific Gravity, Urine: 1.011 (ref 1.001–1.03)
pH: 6.5 (ref 5.0–8.0)

## 2019-10-16 LAB — PSA: PSA: 0.11 ng/mL (ref <=4.0)

## 2019-10-16 LAB — TSH: TSH: 2.26 mIU/L (ref 0.40–4.50)

## 2019-11-07 ENCOUNTER — Ambulatory Visit (INDEPENDENT_AMBULATORY_CARE_PROVIDER_SITE_OTHER): Payer: Medicare Other | Admitting: *Deleted

## 2019-11-07 ENCOUNTER — Other Ambulatory Visit: Payer: Self-pay

## 2019-11-07 DIAGNOSIS — Z23 Encounter for immunization: Secondary | ICD-10-CM

## 2019-11-09 ENCOUNTER — Other Ambulatory Visit: Payer: Self-pay | Admitting: Internal Medicine

## 2019-11-18 ENCOUNTER — Encounter: Payer: Self-pay | Admitting: Pulmonary Disease

## 2019-11-18 ENCOUNTER — Ambulatory Visit: Payer: Medicare Other | Admitting: Pulmonary Disease

## 2019-11-18 ENCOUNTER — Other Ambulatory Visit: Payer: Self-pay

## 2019-11-18 VITALS — BP 140/78 | HR 72 | Temp 97.7°F | Ht 69.0 in | Wt 233.0 lb

## 2019-11-18 DIAGNOSIS — Z9989 Dependence on other enabling machines and devices: Secondary | ICD-10-CM

## 2019-11-18 DIAGNOSIS — G4733 Obstructive sleep apnea (adult) (pediatric): Secondary | ICD-10-CM | POA: Diagnosis not present

## 2019-11-18 NOTE — Progress Notes (Signed)
[facility]  Chief Complaint  Patient presents with  . Follow-up    Constitutional:  BP 140/78 (BP Location: Left Arm)   Pulse 72   Temp 97.7 F (36.5 C)   Ht 5\' 9"  (1.753 m)   Wt 233 lb (105.7 kg)   SpO2 99%   BMI 34.41 kg/m   Past Medical History:  Prostate cancer, PACs, OA, HTN, HLD, Colon polyps, Gout, BPH  Past Surgical History:  His  has a past surgical history that includes Total knee arthroplasty (Right, 06-22-2007); Colonoscopy (07-06-2006); Tonsillectomy (as child); Appendectomy (1966); Prostate biopsy (N/A, 03/28/2014); Gold seed implant (N/A, 08/12/2014); and Green light laser turp (transurethral resection of prostate (N/A, 07/20/2015).  Brief Summary:  Jeffrey Carter is a 79 y.o. male with obstructive sleep apnea.      Subjective:   He uses CPAP nightly.  Has full face mask.  Gets occasional mask leak.  Not having sore throat, dry mouth, or aerophagia.  Doesn't feel like the pressure is same like it was before.  He thinks his machine is more than 77 yrs old.  Physical Exam:   Appearance - well kempt   ENMT - no sinus tenderness, no oral exudate, no LAN, Mallampati 4 airway, no stridor, poor dentition  Respiratory - equal breath sounds bilaterally, no wheezing or rales  CV - s1s2 regular rate and rhythm, no murmurs  Ext - no clubbing, no edema  Skin - no rashes  Psych - normal mood and affect   Sleep Tests:   PSG 02/29/12 >> AHI 32, SpO2 low 79%  CPAP 07/10/18 to 08/08/18 >> used on 30 of 30 nights with average 8 hrs 1 min.  Average AHI 3 with CPAP 12 cm H2O  Social History:  He  reports that he quit smoking about 57 years ago. His smoking use included cigarettes. He has a 2.00 pack-year smoking history. He has never used smokeless tobacco. He reports that he does not drink alcohol and does not use drugs.  Family History:  His family history includes Cancer in his brother; Diabetes in his father.      Assessment/Plan:   Obstructive sleep apnea. - he is compliant with CPAP and reports benefit from therapy - uses [facility] for his DME - his current machine is more than 77 yrs old, not functioning properly, and not amenable to repair - will arrange for new CPAP machine 12 cm H2O  Time Spent Involved in Patient Care on Day of Examination:  14 minutes  Follow up:  Patient Instructions  Will arrange for new CPAP machine  Follow up in 3 months   Medication List:   Allergies as of 11/18/2019      Reactions   Percocet [oxycodone-acetaminophen]    nausea      Medication List       Accurate as of November 18, 2019 10:19 AM. If you have any questions, ask your nurse or doctor.        acetaminophen 325 MG tablet Commonly known as: TYLENOL Take 2 tablets (650 mg total) by mouth every 8 (eight) hours.   amLODipine 5 MG tablet Commonly known as: NORVASC Take 1 tablet by mouth once daily   cloNIDine 0.3 MG tablet Commonly known as: CATAPRES Take 1 tablet by mouth twice daily   EQ Allergy Relief 10 MG tablet Generic drug: loratadine TAKE ONE TABLET BY MOUTH ONCE DAILY   fluticasone 50 MCG/ACT nasal spray Commonly known as: FLONASE  Place 2 sprays into both nostrils daily. What changed:   when to take this  reasons to take this   furosemide 40 MG tablet Commonly known as: LASIX Take 1 tablet by mouth once daily   HYDROcodone-acetaminophen 7.5-325 MG tablet Commonly known as: NORCO Take 1 tablet by mouth every 6 (six) hours as needed for moderate pain.   losartan 100 MG tablet Commonly known as: COZAAR Take 1 tablet by mouth once daily   lovastatin 40 MG tablet Commonly known as: MEVACOR TAKE 1 TABLET BY MOUTH ONCE DAILY AT 6PM   meloxicam 15 MG tablet Commonly known as: MOBIC Take 1 tablet (15 mg total) by mouth daily.   Potassium Chloride ER 20 MEQ Tbcr Take 1 tablet by mouth once daily   sildenafil 100 MG tablet Commonly known as: Viagra Take 1  tablet (100 mg total) by mouth as needed for erectile dysfunction.   tamsulosin 0.4 MG Caps capsule Commonly known as: FLOMAX TAKE 1 CAPSULE BY MOUTH  DAILY What changed: when to take this       Signature:  Chesley Mires, MD [place] Pager - [phone] 11/18/2019, 10:19 AM

## 2019-11-18 NOTE — Patient Instructions (Signed)
Will arrange for new CPAP machine  Follow up in 3 months 

## 2019-12-05 ENCOUNTER — Other Ambulatory Visit: Payer: Self-pay | Admitting: Internal Medicine

## 2019-12-09 ENCOUNTER — Telehealth: Payer: Self-pay | Admitting: Internal Medicine

## 2019-12-09 MED ORDER — CLONIDINE HCL 0.3 MG PO TABS
0.3000 mg | ORAL_TABLET | Freq: Two times a day (BID) | ORAL | 1 refills | Status: DC
Start: 2019-12-09 — End: 2020-05-04

## 2019-12-09 NOTE — Telephone Encounter (Signed)
Medication Refill   Medication:  cloNIDine (CATAPRES) 0.3 MG tablet  Preferred Pharmacy:  [place], [address] Phone:  [phone]  Fax:  [phone]     Is it on the Medication list? Yes   Last visit: 10/15/2019  Next visit: 01/06/2020

## 2019-12-09 NOTE — Telephone Encounter (Signed)
Reviewed chart pt is up-to-date sent refills to [facility].../lmb  

## 2020-01-06 ENCOUNTER — Encounter: Payer: Medicare Other | Admitting: Internal Medicine

## 2020-01-06 DIAGNOSIS — G4733 Obstructive sleep apnea (adult) (pediatric): Secondary | ICD-10-CM | POA: Diagnosis not present

## 2020-01-06 DIAGNOSIS — Z0289 Encounter for other administrative examinations: Secondary | ICD-10-CM

## 2020-01-13 ENCOUNTER — Other Ambulatory Visit: Payer: Self-pay | Admitting: Internal Medicine

## 2020-02-21 DIAGNOSIS — G4733 Obstructive sleep apnea (adult) (pediatric): Secondary | ICD-10-CM | POA: Diagnosis not present

## 2020-03-16 ENCOUNTER — Telehealth: Payer: Self-pay | Admitting: Pulmonary Disease

## 2020-03-16 NOTE — Telephone Encounter (Signed)
Call returned to patient, confirmed DOB. Patient reports he got his new cpap machine. He wanted to be sure to keep VS updated. He reports his pressures and his mask is working great. Made aware I would pass this information on to VS. Voiced understanding.   VS just FYI. Thanks :)

## 2020-03-23 DIAGNOSIS — G4733 Obstructive sleep apnea (adult) (pediatric): Secondary | ICD-10-CM | POA: Diagnosis not present

## 2020-04-20 DIAGNOSIS — G4733 Obstructive sleep apnea (adult) (pediatric): Secondary | ICD-10-CM | POA: Diagnosis not present

## 2020-04-23 DIAGNOSIS — G4733 Obstructive sleep apnea (adult) (pediatric): Secondary | ICD-10-CM | POA: Diagnosis not present

## 2020-04-28 ENCOUNTER — Encounter: Payer: Self-pay | Admitting: Internal Medicine

## 2020-04-28 ENCOUNTER — Ambulatory Visit (INDEPENDENT_AMBULATORY_CARE_PROVIDER_SITE_OTHER): Payer: Medicare Other | Admitting: Internal Medicine

## 2020-04-28 ENCOUNTER — Other Ambulatory Visit: Payer: Self-pay

## 2020-04-28 DIAGNOSIS — I10 Essential (primary) hypertension: Secondary | ICD-10-CM | POA: Diagnosis not present

## 2020-04-28 DIAGNOSIS — M1000 Idiopathic gout, unspecified site: Secondary | ICD-10-CM

## 2020-04-28 DIAGNOSIS — G4733 Obstructive sleep apnea (adult) (pediatric): Secondary | ICD-10-CM | POA: Diagnosis not present

## 2020-04-28 DIAGNOSIS — I491 Atrial premature depolarization: Secondary | ICD-10-CM | POA: Diagnosis not present

## 2020-04-28 LAB — COMPREHENSIVE METABOLIC PANEL
ALT: 15 U/L (ref 0–53)
AST: 14 U/L (ref 0–37)
Albumin: 3.9 g/dL (ref 3.5–5.2)
Alkaline Phosphatase: 96 U/L (ref 39–117)
BUN: 12 mg/dL (ref 6–23)
CO2: 31 mEq/L (ref 19–32)
Calcium: 8.9 mg/dL (ref 8.4–10.5)
Chloride: 102 mEq/L (ref 96–112)
Creatinine, Ser: 1.12 mg/dL (ref 0.40–1.50)
GFR: 63.16 mL/min (ref >60.00)
Glucose, Bld: 87 mg/dL (ref 70–99)
Potassium: 4.1 mEq/L (ref 3.5–5.1)
Sodium: 139 mEq/L (ref 135–145)
Total Bilirubin: 0.4 mg/dL (ref 0.2–1.2)
Total Protein: 6.2 g/dL (ref 6.0–8.3)

## 2020-04-28 NOTE — Assessment & Plan Note (Signed)
No relapse 

## 2020-04-28 NOTE — Assessment & Plan Note (Signed)
Cont w/Amlodipine, Catapress, Furosemide, Losartan

## 2020-04-28 NOTE — Progress Notes (Signed)
Subjective:  Patient ID: Jeffrey Carter, male    DOB: 08/13/1940  Age: 80 y.o. MRN: [id number]  CC: Annual Exam (cpe)   HPI Jeffrey Carter presents for HTN, gout, PACs f/u  Outpatient Medications Prior to Visit  Medication Sig Dispense Refill  . acetaminophen (TYLENOL) 325 MG tablet Take 2 tablets (650 mg total) by mouth every 8 (eight) hours. 30 tablet 0  . amLODipine (NORVASC) 5 MG tablet Take 1 tablet by mouth once daily 90 tablet 1  . cloNIDine (CATAPRES) 0.3 MG tablet Take 1 tablet (0.3 mg total) by mouth 2 (two) times daily. 180 tablet 1  . EQ ALLERGY RELIEF 10 MG tablet TAKE ONE TABLET BY MOUTH ONCE DAILY 90 tablet 3  . fluticasone (FLONASE) 50 MCG/ACT nasal spray Place 2 sprays into both nostrils daily. (Patient taking differently: Place 2 sprays into both nostrils daily as needed for allergies.) 16 g 2  . furosemide (LASIX) 40 MG tablet Take 1 tablet by mouth once daily 90 tablet 1  . HYDROcodone-acetaminophen (NORCO) 7.5-325 MG tablet Take 1 tablet by mouth every 6 (six) hours as needed for moderate pain. 20 tablet 0  . losartan (COZAAR) 100 MG tablet Take 1 tablet by mouth once daily 90 tablet 1  . lovastatin (MEVACOR) 40 MG tablet TAKE 1 TABLET BY MOUTH ONCE DAILY AT 6 PM. 90 tablet 1  . meloxicam (MOBIC) 15 MG tablet Take 1 tablet (15 mg total) by mouth daily. 30 tablet 0  . Potassium Chloride ER 20 MEQ TBCR Take 1 tablet by mouth once daily 90 tablet 1  . sildenafil (VIAGRA) 100 MG tablet Take 1 tablet (100 mg total) by mouth as needed for erectile dysfunction. 12 tablet 5  . tamsulosin (FLOMAX) 0.4 MG CAPS capsule TAKE 1 CAPSULE BY MOUTH  DAILY (Patient taking differently: Take 0.4 mg by mouth 2 (two) times daily.) 90 capsule 1   No facility-administered medications prior to visit.    ROS: Review of Systems  Constitutional: Negative for appetite change, fatigue and unexpected weight change.  HENT: Positive for postnasal drip. Negative for congestion, nosebleeds,  sneezing, sore throat and trouble swallowing.   Eyes: Negative for itching and visual disturbance.  Respiratory: Negative for cough.   Cardiovascular: Negative for chest pain, palpitations and leg swelling.  Gastrointestinal: Negative for abdominal distention, blood in stool, diarrhea and nausea.  Genitourinary: Negative for frequency and hematuria.  Musculoskeletal: Positive for arthralgias and gait problem. Negative for back pain, joint swelling and neck pain.  Skin: Negative for rash.  Neurological: Negative for dizziness, tremors, speech difficulty and weakness.  Psychiatric/Behavioral: Negative for agitation, dysphoric mood and sleep disturbance. The patient is not nervous/anxious.     Objective:  BP (!) 146/80   Pulse 72   Temp 98.2 F (36.8 C) (Oral)   Ht 5\' 9"  (1.753 m)   Wt 245 lb 9.6 oz (111.4 kg)   SpO2 96%   BMI 36.27 kg/m   BP Readings from Last 3 Encounters:  04/28/20 (!) 146/80  11/18/19 140/78  10/15/19 136/86    Wt Readings from Last 3 Encounters:  04/28/20 245 lb 9.6 oz (111.4 kg)  11/18/19 233 lb (105.7 kg)  10/15/19 233 lb (105.7 kg)    Physical Exam Constitutional:      General: He is not in acute distress.    Appearance: He is well-developed.     Comments: NAD  Eyes:     Conjunctiva/sclera: Conjunctivae normal.     Pupils: Pupils  are equal, round, and reactive to light.  Neck:     Thyroid: No thyromegaly.     Vascular: No JVD.  Cardiovascular:     Rate and Rhythm: Normal rate and regular rhythm.     Heart sounds: Normal heart sounds. No murmur heard. No friction rub. No gallop.   Pulmonary:     Effort: Pulmonary effort is normal. No respiratory distress.     Breath sounds: Normal breath sounds. No wheezing or rales.  Chest:     Chest wall: No tenderness.  Abdominal:     General: Bowel sounds are normal. There is no distension.     Palpations: Abdomen is soft. There is no mass.     Tenderness: There is no abdominal tenderness. There is no  guarding or rebound.  Musculoskeletal:        General: Tenderness present. Normal range of motion.     Cervical back: Normal range of motion.  Lymphadenopathy:     Cervical: No cervical adenopathy.  Skin:    General: Skin is warm and dry.     Findings: No rash.  Neurological:     Mental Status: He is alert and oriented to person, place, and time.     Cranial Nerves: No cranial nerve deficit.     Motor: No abnormal muscle tone.     Coordination: Coordination normal.     Gait: Gait normal.     Deep Tendon Reflexes: Reflexes are normal and symmetric.  Psychiatric:        Behavior: Behavior normal.        Thought Content: Thought content normal.        Judgment: Judgment normal.     Lab Results  Component Value Date   WBC 3.4 (L) 10/15/2019   HGB 13.2 10/15/2019   HCT 39.9 10/15/2019   PLT 161 10/15/2019   GLUCOSE 107 (H) 10/15/2019   CHOL 131 10/15/2019   TRIG 67 10/15/2019   HDL 43 10/15/2019   LDLCALC 74 10/15/2019   ALT 17 10/15/2019   AST 15 10/15/2019   NA 140 10/15/2019   K 3.9 10/15/2019   CL 104 10/15/2019   CREATININE 0.94 10/15/2019   BUN 12 10/15/2019   CO2 29 10/15/2019   TSH 2.26 10/15/2019   PSA 0.11 10/15/2019   INR 1.01 10/12/2014    DG Foot Complete Left  Result Date: 06/07/2019 CLINICAL DATA:  Left heel pain for 1 week common no known injury, initial encounter EXAM: LEFT FOOT - COMPLETE 3+ VIEW COMPARISON:  None. FINDINGS: No acute fracture or dislocation is noted. Calcaneal spurs are seen. No soft tissue abnormality is noted. Mild hallux valgus deformity is seen. IMPRESSION: Degenerative changes without acute abnormality. Electronically Signed   By: Jeffrey Carter M.D.   On: 06/07/2019 23:03    Assessment & Plan:   Jeffrey Kehr, MD

## 2020-04-28 NOTE — Assessment & Plan Note (Signed)
Doing well 

## 2020-04-28 NOTE — Assessment & Plan Note (Signed)
On CPAP. ?

## 2020-05-02 ENCOUNTER — Other Ambulatory Visit: Payer: Self-pay | Admitting: Internal Medicine

## 2020-05-21 DIAGNOSIS — G4733 Obstructive sleep apnea (adult) (pediatric): Secondary | ICD-10-CM | POA: Diagnosis not present

## 2020-06-20 DIAGNOSIS — G4733 Obstructive sleep apnea (adult) (pediatric): Secondary | ICD-10-CM | POA: Diagnosis not present

## 2020-07-21 DIAGNOSIS — G4733 Obstructive sleep apnea (adult) (pediatric): Secondary | ICD-10-CM | POA: Diagnosis not present

## 2020-07-23 DIAGNOSIS — G4733 Obstructive sleep apnea (adult) (pediatric): Secondary | ICD-10-CM | POA: Diagnosis not present

## 2020-07-28 ENCOUNTER — Encounter: Payer: Self-pay | Admitting: Internal Medicine

## 2020-07-28 ENCOUNTER — Ambulatory Visit (INDEPENDENT_AMBULATORY_CARE_PROVIDER_SITE_OTHER): Payer: Medicare Other | Admitting: Internal Medicine

## 2020-07-28 ENCOUNTER — Other Ambulatory Visit: Payer: Self-pay

## 2020-07-28 DIAGNOSIS — C61 Malignant neoplasm of prostate: Secondary | ICD-10-CM

## 2020-07-28 DIAGNOSIS — N320 Bladder-neck obstruction: Secondary | ICD-10-CM | POA: Diagnosis not present

## 2020-07-28 DIAGNOSIS — E785 Hyperlipidemia, unspecified: Secondary | ICD-10-CM

## 2020-07-28 DIAGNOSIS — I10 Essential (primary) hypertension: Secondary | ICD-10-CM | POA: Diagnosis not present

## 2020-07-28 NOTE — Assessment & Plan Note (Signed)
On Flomax 

## 2020-07-28 NOTE — Assessment & Plan Note (Signed)
S/p XRT 

## 2020-07-28 NOTE — Progress Notes (Signed)
Subjective:  Patient ID: Jeffrey Carter, male    DOB: 01-23-1941  Age: 80 y.o. MRN: [id number]  CC: Follow-up (3 month f/u)   HPI Jeffrey Carter presents for dyslipidemia, HTN, BPH  Outpatient Medications Prior to Visit  Medication Sig Dispense Refill   acetaminophen (TYLENOL) 325 MG tablet Take 2 tablets (650 mg total) by mouth every 8 (eight) hours. 30 tablet 0   amLODipine (NORVASC) 5 MG tablet Take 1 tablet by mouth once daily 90 tablet 3   cloNIDine (CATAPRES) 0.3 MG tablet Take 1 tablet by mouth twice daily 180 tablet 0   EQ ALLERGY RELIEF 10 MG tablet TAKE ONE TABLET BY MOUTH ONCE DAILY 90 tablet 3   fluticasone (FLONASE) 50 MCG/ACT nasal spray Place 2 sprays into both nostrils daily. (Patient taking differently: Place 2 sprays into both nostrils daily as needed for allergies.) 16 g 2   furosemide (LASIX) 40 MG tablet Take 1 tablet by mouth once daily 90 tablet 3   HYDROcodone-acetaminophen (NORCO) 7.5-325 MG tablet Take 1 tablet by mouth every 6 (six) hours as needed for moderate pain. 20 tablet 0   losartan (COZAAR) 100 MG tablet Take 1 tablet by mouth once daily 90 tablet 0   lovastatin (MEVACOR) 40 MG tablet TAKE 1 TABLET BY MOUTH ONCE DAILY 6 IN THE EVENING 90 tablet 3   meloxicam (MOBIC) 15 MG tablet Take 1 tablet (15 mg total) by mouth daily. 30 tablet 0   Potassium Chloride ER 20 MEQ TBCR Take 1 tablet by mouth once daily 90 tablet 3   sildenafil (VIAGRA) 100 MG tablet Take 1 tablet (100 mg total) by mouth as needed for erectile dysfunction. 12 tablet 5   tamsulosin (FLOMAX) 0.4 MG CAPS capsule TAKE 1 CAPSULE BY MOUTH  DAILY (Patient taking differently: Take 0.4 mg by mouth 2 (two) times daily.) 90 capsule 1   No facility-administered medications prior to visit.    ROS: Review of Systems  Constitutional:  Negative for appetite change, fatigue and unexpected weight change.  HENT:  Negative for congestion, nosebleeds, sneezing, sore throat and trouble swallowing.    Eyes:  Negative for itching and visual disturbance.  Respiratory:  Negative for cough.   Cardiovascular:  Negative for chest pain, palpitations and leg swelling.  Gastrointestinal:  Negative for abdominal distention, blood in stool, diarrhea and nausea.  Genitourinary:  Negative for frequency and hematuria.  Musculoskeletal:  Positive for arthralgias and back pain. Negative for gait problem, joint swelling and neck pain.  Skin:  Negative for rash.  Neurological:  Negative for dizziness, tremors, speech difficulty and weakness.  Psychiatric/Behavioral:  Negative for agitation, dysphoric mood, sleep disturbance and suicidal ideas. The patient is not nervous/anxious.    Objective:  BP 140/78 (BP Location: Left Arm)   Pulse 80   Temp 98.5 F (36.9 C) (Oral)   Ht 5\' 9"  (1.753 m)   Wt 245 lb 9.6 oz (111.4 kg)   SpO2 95%   BMI 36.27 kg/m   BP Readings from Last 3 Encounters:  07/28/20 140/78  04/28/20 (!) 146/80  11/18/19 140/78    Wt Readings from Last 3 Encounters:  07/28/20 245 lb 9.6 oz (111.4 kg)  04/28/20 245 lb 9.6 oz (111.4 kg)  11/18/19 233 lb (105.7 kg)    Physical Exam Constitutional:      General: He is not in acute distress.    Appearance: He is well-developed. He is obese.     Comments: NAD  Eyes:  Conjunctiva/sclera: Conjunctivae normal.     Pupils: Pupils are equal, round, and reactive to light.  Neck:     Thyroid: No thyromegaly.     Vascular: No JVD.  Cardiovascular:     Rate and Rhythm: Normal rate and regular rhythm.     Heart sounds: Normal heart sounds. No murmur heard.   No friction rub. No gallop.  Pulmonary:     Effort: Pulmonary effort is normal. No respiratory distress.     Breath sounds: Normal breath sounds. No wheezing or rales.  Chest:     Chest wall: No tenderness.  Abdominal:     General: Bowel sounds are normal. There is no distension.     Palpations: Abdomen is soft. There is no mass.     Tenderness: There is no abdominal  tenderness. There is no guarding or rebound.  Musculoskeletal:        General: No tenderness. Normal range of motion.     Cervical back: Normal range of motion.  Lymphadenopathy:     Cervical: No cervical adenopathy.  Skin:    General: Skin is warm and dry.     Findings: No rash.  Neurological:     Mental Status: He is alert and oriented to person, place, and time.     Cranial Nerves: No cranial nerve deficit.     Motor: No abnormal muscle tone.     Coordination: Coordination normal.     Gait: Gait normal.     Deep Tendon Reflexes: Reflexes are normal and symmetric.  Psychiatric:        Behavior: Behavior normal.        Thought Content: Thought content normal.        Judgment: Judgment normal.    Lab Results  Component Value Date   WBC 3.4 (L) 10/15/2019   HGB 13.2 10/15/2019   HCT 39.9 10/15/2019   PLT 161 10/15/2019   GLUCOSE 87 04/28/2020   CHOL 131 10/15/2019   TRIG 67 10/15/2019   HDL 43 10/15/2019   LDLCALC 74 10/15/2019   ALT 15 04/28/2020   AST 14 04/28/2020   NA 139 04/28/2020   K 4.1 04/28/2020   CL 102 04/28/2020   CREATININE 1.12 04/28/2020   BUN 12 04/28/2020   CO2 31 04/28/2020   TSH 2.26 10/15/2019   PSA 0.11 10/15/2019   INR 1.01 10/12/2014    DG Foot Complete Left  Result Date: 06/07/2019 CLINICAL DATA:  Left heel pain for 1 week common no known injury, initial encounter EXAM: LEFT FOOT - COMPLETE 3+ VIEW COMPARISON:  None. FINDINGS: No acute fracture or dislocation is noted. Calcaneal spurs are seen. No soft tissue abnormality is noted. Mild hallux valgus deformity is seen. IMPRESSION: Degenerative changes without acute abnormality. Electronically Signed   By: Inez Catalina M.D.   On: 06/07/2019 23:03     Assessment & Plan:   There are no diagnoses linked to this encounter.   No orders of the defined types were placed in this encounter.    Follow-up: No follow-ups on file.  [name], MD

## 2020-07-28 NOTE — Assessment & Plan Note (Signed)
Cont w/Lovastatin 

## 2020-07-28 NOTE — Assessment & Plan Note (Signed)
Better BP control Amlodipine, Catapress, Furosemide, Losartan

## 2020-07-30 ENCOUNTER — Other Ambulatory Visit: Payer: Self-pay | Admitting: Internal Medicine

## 2020-08-20 DIAGNOSIS — G4733 Obstructive sleep apnea (adult) (pediatric): Secondary | ICD-10-CM | POA: Diagnosis not present

## 2020-08-24 ENCOUNTER — Telehealth: Payer: Self-pay | Admitting: Internal Medicine

## 2020-08-24 NOTE — Telephone Encounter (Signed)
Noted. Thanks.

## 2020-08-24 NOTE — Telephone Encounter (Signed)
[place]  [id number]  August 24, 2020  A1C 7.28 June 2019  A1C 7.2  Patient states he is not diabetic, however, nurse is noticing readings over 7 one year apart and wanted to inform the provider

## 2020-09-05 ENCOUNTER — Other Ambulatory Visit: Payer: Self-pay | Admitting: Internal Medicine

## 2020-09-08 ENCOUNTER — Other Ambulatory Visit: Payer: Self-pay

## 2020-09-08 ENCOUNTER — Ambulatory Visit (INDEPENDENT_AMBULATORY_CARE_PROVIDER_SITE_OTHER): Payer: Medicare Other | Admitting: Internal Medicine

## 2020-09-08 ENCOUNTER — Ambulatory Visit (INDEPENDENT_AMBULATORY_CARE_PROVIDER_SITE_OTHER): Payer: Medicare Other

## 2020-09-08 ENCOUNTER — Encounter: Payer: Self-pay | Admitting: Internal Medicine

## 2020-09-08 VITALS — BP 162/90 | HR 71 | Temp 98.0°F | Ht 69.0 in | Wt 236.4 lb

## 2020-09-08 VITALS — BP 162/90 | HR 71 | Temp 98.0°F | Ht 69.0 in | Wt 235.9 lb

## 2020-09-08 DIAGNOSIS — R739 Hyperglycemia, unspecified: Secondary | ICD-10-CM | POA: Diagnosis not present

## 2020-09-08 DIAGNOSIS — Z0000 Encounter for general adult medical examination without abnormal findings: Secondary | ICD-10-CM | POA: Diagnosis not present

## 2020-09-08 LAB — POCT GLYCOSYLATED HEMOGLOBIN (HGB A1C)
HbA1c POC (<> result, manual entry): 5.6 % (ref 4.0–5.6)
HbA1c, POC (controlled diabetic range): 5.6 % (ref 0.0–7.0)
HbA1c, POC (prediabetic range): 5.6 % — AB (ref 5.7–6.4)
Hemoglobin A1C: 5.6 % (ref 4.0–5.6)

## 2020-09-08 NOTE — Assessment & Plan Note (Addendum)
Mild Will get HgbA1c today is 5.6% Will monitor

## 2020-09-08 NOTE — Progress Notes (Signed)
Subjective:  Patient ID: Jeffrey Carter, male    DOB: Jun 07, 1940  Age: 80 y.o. MRN: [id number]  CC: Follow-up (Want to discuss A1C that was check w/ [facility] nurse results was 7.4. Was told he need to talk w/ MD Lillia Dallas he is a diabetic)   HPI Jeffrey Carter presents for elevated glucose - home visit. - "to discuss A1C that was check w/ [facility] nurse results was 7.4. Was told he need to talk w/ MD bcz he is a diabetic" BP is OK at home   Outpatient Medications Prior to Visit  Medication Sig Dispense Refill   acetaminophen (TYLENOL) 325 MG tablet Take 2 tablets (650 mg total) by mouth every 8 (eight) hours. 30 tablet 0   amLODipine (NORVASC) 5 MG tablet Take 1 tablet by mouth once daily 90 tablet 3   cloNIDine (CATAPRES) 0.3 MG tablet Take 1 tablet by mouth twice daily 180 tablet 3   EQ ALLERGY RELIEF 10 MG tablet TAKE ONE TABLET BY MOUTH ONCE DAILY 90 tablet 3   fluticasone (FLONASE) 50 MCG/ACT nasal spray Place 2 sprays into both nostrils daily. (Patient taking differently: Place 2 sprays into both nostrils daily as needed for allergies.) 16 g 2   furosemide (LASIX) 40 MG tablet Take 1 tablet by mouth once daily 90 tablet 3   HYDROcodone-acetaminophen (NORCO) 7.5-325 MG tablet Take 1 tablet by mouth every 6 (six) hours as needed for moderate pain. 20 tablet 0   losartan (COZAAR) 100 MG tablet Take 1 tablet by mouth once daily 90 tablet 3   lovastatin (MEVACOR) 40 MG tablet TAKE 1 TABLET BY MOUTH ONCE DAILY 6 IN THE EVENING 90 tablet 3   meloxicam (MOBIC) 15 MG tablet Take 1 tablet (15 mg total) by mouth daily. 30 tablet 0   Potassium Chloride ER 20 MEQ TBCR Take 1 tablet by mouth once daily 90 tablet 3   sildenafil (VIAGRA) 100 MG tablet Take 1 tablet (100 mg total) by mouth as needed for erectile dysfunction. 12 tablet 5   tamsulosin (FLOMAX) 0.4 MG CAPS capsule TAKE 1 CAPSULE BY MOUTH  DAILY (Patient taking differently: Take 0.4 mg by mouth 2 (two) times daily.) 90  capsule 1   No facility-administered medications prior to visit.    ROS: Review of Systems  Constitutional:  Negative for appetite change, fatigue and unexpected weight change.  HENT:  Negative for congestion, nosebleeds, sneezing, sore throat and trouble swallowing.   Eyes:  Negative for itching and visual disturbance.  Respiratory:  Negative for cough.   Cardiovascular:  Negative for chest pain, palpitations and leg swelling.  Gastrointestinal:  Negative for abdominal distention, blood in stool, diarrhea and nausea.  Genitourinary:  Negative for frequency and hematuria.  Musculoskeletal:  Negative for back pain, gait problem, joint swelling and neck pain.  Skin:  Negative for rash.  Neurological:  Negative for dizziness, tremors, speech difficulty and weakness.  Psychiatric/Behavioral:  Negative for agitation, dysphoric mood and sleep disturbance. The patient is not nervous/anxious.    Objective:  BP (!) 162/90 (BP Location: Left Arm)   Pulse 71   Temp 98 F (36.7 C) (Oral)   Ht '5\' 9"'$  (1.753 m)   Wt 236 lb 6.4 oz (107.2 kg)   SpO2 97%   BMI 34.91 kg/m   BP Readings from Last 3 Encounters:  09/08/20 (!) 162/90  07/28/20 140/78  04/28/20 (!) 146/80    Wt Readings from Last 3 Encounters:  09/08/20  236 lb 6.4 oz (107.2 kg)  07/28/20 245 lb 9.6 oz (111.4 kg)  04/28/20 245 lb 9.6 oz (111.4 kg)    Physical Exam  Lab Results  Component Value Date   WBC 3.4 (L) 10/15/2019   HGB 13.2 10/15/2019   HCT 39.9 10/15/2019   PLT 161 10/15/2019   GLUCOSE 87 04/28/2020   CHOL 131 10/15/2019   TRIG 67 10/15/2019   HDL 43 10/15/2019   LDLCALC 74 10/15/2019   ALT 15 04/28/2020   AST 14 04/28/2020   NA 139 04/28/2020   K 4.1 04/28/2020   CL 102 04/28/2020   CREATININE 1.12 04/28/2020   BUN 12 04/28/2020   CO2 31 04/28/2020   TSH 2.26 10/15/2019   PSA 0.11 10/15/2019   INR 1.01 10/12/2014   HGBA1C 5.6 09/08/2020   HGBA1C 5.6 09/08/2020   HGBA1C 5.6 (A) 09/08/2020    HGBA1C 5.6 09/08/2020    DG Foot Complete Left  Result Date: 06/07/2019 CLINICAL DATA:  Left heel pain for 1 week common no known injury, initial encounter EXAM: LEFT FOOT - COMPLETE 3+ VIEW COMPARISON:  None. FINDINGS: No acute fracture or dislocation is noted. Calcaneal spurs are seen. No soft tissue abnormality is noted. Mild hallux valgus deformity is seen. IMPRESSION: Degenerative changes without acute abnormality. Electronically Signed   By: Inez Catalina M.D.   On: 06/07/2019 23:03    Assessment & Plan:      Follow-up: No follow-ups on file.  [name], MD

## 2020-09-08 NOTE — Progress Notes (Addendum)
Subjective:   Jeffrey Carter is a 80 y.o. male who presents for Medicare Annual/Subsequent preventive examination.  Review of Systems     Cardiac Risk Factors include: advanced age (>100mn, >>63women);dyslipidemia;hypertension;male gender;obesity (BMI >30kg/m2)     Objective:    Today's Vitals   09/08/20 1559  BP: (!) 162/90  Pulse: 71  Temp: 98 F (36.7 C)  SpO2: 97%  Weight: 235 lb 15 oz (107 kg)  Height: '5\' 9"'$  (1.753 m)  PainSc: 0-No pain   Body mass index is 34.84 kg/m.  Advanced Directives 09/08/2020 06/28/2018 04/25/2017 10/12/2016 08/31/2016 07/31/2016 06/14/2016  Does Patient Have a Medical Advance Directive? Yes No;Yes No No No No No  Type of Advance Directive - Healthcare Power of [place] will - - - - -  Does patient want to make changes to medical advance directive? No - Patient declined - - - - - -  Copy of [place] Chart? - No - copy requested - - - - -  Would patient like information on creating a medical advance directive? - - Yes (ED - Information included in AVS) No - Patient declined No - Patient declined - -    Current Medications (verified) Outpatient Encounter Medications as of 09/08/2020  Medication Sig   acetaminophen (TYLENOL) 325 MG tablet Take 2 tablets (650 mg total) by mouth every 8 (eight) hours.   amLODipine (NORVASC) 5 MG tablet Take 1 tablet by mouth once daily   cloNIDine (CATAPRES) 0.3 MG tablet Take 1 tablet by mouth twice daily   EQ ALLERGY RELIEF 10 MG tablet TAKE ONE TABLET BY MOUTH ONCE DAILY   fluticasone (FLONASE) 50 MCG/ACT nasal spray Place 2 sprays into both nostrils daily. (Patient taking differently: Place 2 sprays into both nostrils daily as needed for allergies.)   furosemide (LASIX) 40 MG tablet Take 1 tablet by mouth once daily   HYDROcodone-acetaminophen (NORCO) 7.5-325 MG tablet Take 1 tablet by mouth every 6 (six) hours as needed for moderate pain.   losartan (COZAAR) 100 MG tablet Take 1 tablet by  mouth once daily   lovastatin (MEVACOR) 40 MG tablet TAKE 1 TABLET BY MOUTH ONCE DAILY 6 IN THE EVENING   meloxicam (MOBIC) 15 MG tablet Take 1 tablet (15 mg total) by mouth daily.   Potassium Chloride ER 20 MEQ TBCR Take 1 tablet by mouth once daily   sildenafil (VIAGRA) 100 MG tablet Take 1 tablet (100 mg total) by mouth as needed for erectile dysfunction.   tamsulosin (FLOMAX) 0.4 MG CAPS capsule TAKE 1 CAPSULE BY MOUTH  DAILY (Patient taking differently: Take 0.4 mg by mouth 2 (two) times daily.)   No facility-administered encounter medications on file as of 09/08/2020.    Allergies (verified) Percocet [oxycodone-acetaminophen]   History: Past Medical History:  Diagnosis Date   Bladder outlet obstruction    BPH (benign prostatic hyperplasia)    Conjunctivitis, acute, bilateral    07-02-2015  per pcp note and started antibiotic drops   First degree heart block    History of acute conjunctivitis    07-02-2015  resolved after round of antibiotic eye drops   History of gout    BIG TOE   History of urinary retention 12/2014   Hx of adenomatous colonic polyps 07/07/2016   Hyperlipidemia    Hypertension    Lower urinary tract symptoms (LUTS)    OSA on CPAP    MODERATE PER STUDY 03-08-2012   Osteoarthritis    PAC (  premature atrial contraction)    Prostate cancer [facility]) urologist-  dr budzyn/  oncologist-  dr Ander Slade   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),  Gleason 3+4,  PSA 10.57,  vol 115m //   External beam radiation therapy  08-27-2014 to 10-22-2014   S/P radiation therapy 08-27-2014  to  10-22-2014   prostate 7800Gy in 40 sessions and seminal vesicals 5000Gy in 40 sessions   Wears glasses    Past Surgical History:  Procedure Laterality Date   APPENDECTOMY  1966   COLONOSCOPY  07-06-2006   GOLD SEED IMPLANT N/A 08/12/2014   Procedure: GOLD SEED IMPLANT;  Surgeon: MLowella Bandy MD;  [facility]  Service: Urology;  Laterality: N/A;   GREEN LIGHT LASER TURP  (TRANSURETHRAL RESECTION OF PROSTATE N/A 07/20/2015   Procedure: GREEN LIGHT LASER ABLATION OF PROSTATE ;  Surgeon: BNickie Retort MD;  [facility]  Service: Urology;  Laterality: N/A;   PROSTATE BIOPSY N/A 03/28/2014   Procedure: BIOPSY TRANSRECTAL ULTRASONIC PROSTATE (TUBP);  Surgeon: MArvil Persons MD;  [facility]  Service: Urology;  Laterality: N/A;   TONSILLECTOMY  as child   TOTAL KNEE ARTHROPLASTY Right 06-22-2007   Family History  Problem Relation Age of Onset   Diabetes Father    Cancer Brother        pancreatic, stomach   Colon cancer Neg Hx    Social History   Socioeconomic History   Marital status: Married    Spouse name: Not on file   Number of children: 4   Years of education: Not on file   Highest education level: Not on file  Occupational History   Occupation: retired  Tobacco Use   Smoking status: Former    Packs/day: 0.25    Years: 8.00    Pack years: 2.00    Types: Cigarettes    Quit date: 01/24/1962    Years since quitting: 58.6   Smokeless tobacco: Never  Vaping Use   Vaping Use: Never used  Substance and Sexual Activity   Alcohol use: No   Drug use: No   Sexual activity: Yes    Partners: Female  Other Topics Concern   Not on file  Social History Narrative   Married 8 years- divorced- married '85   2 sons- '72, '74; 1 daughter '75   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    Difficulty of Paying Living Expenses: Not hard at aOwens-IllinoisInsecurity: No Food Insecurity   Worried About RCharity fundraiserin the Last Year: Never true   RArboriculturistin the Last Year: Never true  Transportation Needs: No Transportation Needs   Lack of Transportation (Medical): No   Lack of Transportation (Non-Medical): No  Physical Activity: Sufficiently Active   Days of Exercise per Week: 5 days   Minutes of Exercise  per Session: 30 min  Stress: No Stress Concern Present   Feeling of Stress : Not at all  Social Connections: Socially Integrated   Frequency of Communication with Friends and Family: More than three times a week   Frequency of Social Gatherings with Friends and Family: Not on file   Attends Religious Services: More than 4 times per year   Active Member of [employer] Organizations: Yes   Attends CArchivistMeetings: More than 4 times per year  Marital Status: Married    Tobacco Counseling Counseling given: Not Answered   Clinical Intake:  Pre-visit preparation completed: Yes  Pain : No/denies pain Pain Score: 0-No pain     BMI - recorded: 34.84 Nutritional Status: BMI > 30  Obese Nutritional Risks: None Diabetes: No  How often do you need to have someone help you when you read instructions, pamphlets, or other written materials from your doctor or pharmacy?: 1 - Never What is the last grade level you completed in school?: High School Graduate  Diabetic? no  Interpreter Needed?: No  Information entered by :: Lisette Abu, LPN   Activities of Daily Living In your present state of health, do you have any difficulty performing the following activities: 09/08/2020  Hearing? N  Vision? N  Difficulty concentrating or making decisions? N  Walking or climbing stairs? N  Dressing or bathing? N  Doing errands, shopping? N  Preparing Food and eating ? N  Using the Toilet? N  In the past six months, have you accidently leaked urine? N  Do you have problems with loss of bowel control? N  Managing your Medications? N  Managing your Finances? N  Housekeeping or managing your Housekeeping? N  Some recent data might be hidden    Patient Care Team: Plotnikov, Evie Lacks, MD as PCP - General (Internal Medicine) Earlie Server, MD (Orthopedic Surgery) Nickie Retort, MD (Inactive) as Consulting Physician (Urology) Chesley Mires, MD as Consulting Physician  (Pulmonary Disease) Regal, Tamala Fothergill, DPM as Consulting Physician (Podiatry) Keene Breath., MD as Consulting Physician (Ophthalmology)  Indicate any recent Medical Services you may have received from other than [facility] providers in the past year (date may be approximate).     Assessment:   This is a routine wellness examination for Jeffrey Carter.  Hearing/Vision screen Hearing Screening - Comments:: Patient denied any hearing difficulty. Vision Screening - Comments:: Patient does not wear corrective lenses.  Cataracts removed and had lens implanted for better vision.  Eye exam done annually by Dr. Barbie Haggis.  Dietary issues and exercise activities discussed: Current Exercise Habits: Home exercise routine, Type of exercise: walking;Other - see comments;strength training/weights (push ups at home everyday), Time (Minutes): 30, Frequency (Times/Week): 5, Weekly Exercise (Minutes/Week): 150, Intensity: Moderate, Exercise limited by: orthopedic condition(s)   Goals Addressed               This Visit's Progress     Patient Stated (pt-stated)        My goal is to continue to stay independent and physically active.      Depression Screen PHQ 2/9 Scores 09/08/2020 07/28/2020 06/28/2018 04/25/2017 04/25/2017 03/21/2016 01/11/2016  PHQ - 2 Score 0 0 0 0 0 1 0  PHQ- 9 Score - 0 - 1 - - -    Fall Risk Fall Risk  09/08/2020 04/28/2020 06/28/2018 04/25/2017 04/25/2017  Falls in the past year? 0 0 0 No No  Number falls in past yr: 0 0 0 - -  Injury with Fall? 0 0 - - -  Risk for fall due to : No Fall Risks No Fall Risks - - -  Follow up Falls evaluation completed Falls evaluation completed - - -    FALL RISK PREVENTION PERTAINING TO THE HOME:  Any stairs in or around the home? No  If so, are there any without handrails? No  Home free of loose throw rugs in walkways, pet beds, electrical cords, etc? Yes  Adequate lighting in your home  to reduce risk of falls? Yes   ASSISTIVE DEVICES UTILIZED TO PREVENT  FALLS:  Life alert? No  Use of a cane, walker or w/c? No  Grab bars in the bathroom? Yes  Shower chair or bench in shower? No  Elevated toilet seat or a handicapped toilet? Yes   TIMED UP AND GO:  Was the test performed? Yes .  Length of time to ambulate 10 feet: 5 sec.   Gait steady and fast without use of assistive device  Cognitive Function: Normal cognitive status assessed by direct observation by this Nurse Health Advisor. No abnormalities found.   MMSE - Mini Mental State Exam 04/25/2017 03/21/2016  Orientation to time 5 5  Orientation to Place 5 5  Registration 3 3  Attention/ Calculation 5 5  Recall 1 1  Language- name 2 objects 2 2  Language- repeat 1 1  Language- follow 3 step command 3 3  Language- read & follow direction 1 1  Write a sentence 1 1  Copy design 1 1  Total score 28 28        Immunizations Immunization History  Administered Date(s) Administered   Fluad Quad(high Dose 65+) 10/03/2018, 11/07/2019   Influenza Split 11/25/2010, 11/03/2011   Influenza Whole 10/24/2007, 10/30/2008, 09/24/2009   Influenza, High Dose Seasonal PF 09/22/2015, 10/18/2016, 10/17/2017   Influenza,inj,Quad PF,6+ Mos 10/17/2012, 10/11/2013, 10/09/2014   PFIZER(Purple Top)SARS-COV-2 Vaccination 03/21/2019, 04/16/2019   PPD Test 10/29/2013, 10/29/2013   Pneumococcal Conjugate-13 02/20/2013   Pneumococcal Polysaccharide-23 10/30/2008, 10/12/2015   Td 10/30/2008   Tdap 11/25/2010   Zoster, Live 03/13/2013    TDAP status: Up to date  Flu Vaccine status: Up to date  Pneumococcal vaccine status: Up to date  Covid-19 vaccine status: Completed vaccines  Qualifies for Shingles Vaccine? Yes   Zostavax completed Yes   Shingrix Completed?: No.    Education has been provided regarding the importance of this vaccine. Patient has been advised to call insurance company to determine out of pocket expense if they have not yet received this vaccine. Advised may also receive vaccine at  local pharmacy or Health Dept. Verbalized acceptance and understanding.  Screening Tests Health Maintenance  Topic Date Due   Hepatitis C Screening  Never done   Zoster Vaccines- Shingrix (1 of 2) Never done   COVID-19 Vaccine (3 - Pfizer risk series) 05/14/2019   INFLUENZA VACCINE  08/24/2020   TETANUS/TDAP  11/24/2020   COLONOSCOPY (Pts 45-50yr Insurance coverage will need to be confirmed)  06/28/2021   PNA vac Low Risk Adult  Completed   HPV VACCINES  Aged Out    Health Maintenance  Health Maintenance Due  Topic Date Due   Hepatitis C Screening  Never done   Zoster Vaccines- Shingrix (1 of 2) Never done   COVID-19 Vaccine (3 - Pfizer risk series) 05/14/2019   INFLUENZA VACCINE  08/24/2020    Colorectal cancer screening: Type of screening: Colonoscopy. Completed 06/28/2016. Repeat every 5 years  Lung Cancer Screening: (Low Dose CT Chest recommended if Age 78-80years, 30 pack-year currently smoking OR have quit w/in 15years.) does not qualify.   Lung Cancer Screening Referral: no  Additional Screening:  Hepatitis C Screening: does qualify; Completed no  Vision Screening: Recommended annual ophthalmology exams for early detection of glaucoma and other disorders of the eye. Is the patient up to date with their annual eye exam?  Yes  Who is the provider or what is the name of the office in which the patient  attends annual eye exams? Barbie Haggis, MD. If pt is not established with a provider, would they like to be referred to a provider to establish care? No .   Dental Screening: Recommended annual dental exams for proper oral hygiene  Community Resource Referral / Chronic Care Management: CRR required this visit?  No   CCM required this visit?  No      Plan:     I have personally reviewed and noted the following in the patient's chart:   Medical and social history Use of alcohol, tobacco or illicit drugs  Current medications and supplements including opioid  prescriptions. Patient is currently taking opioid prescriptions. Information provided to patient regarding non-opioid alternatives. Patient advised to discuss non-opioid treatment plan with their provider. Functional ability and status Nutritional status Physical activity Advanced directives List of other physicians Hospitalizations, surgeries, and ER visits in previous 12 months Vitals Screenings to include cognitive, depression, and falls Referrals and appointments  In addition, I have reviewed and discussed with patient certain preventive protocols, quality metrics, and best practice recommendations. A written personalized care plan for preventive services as well as general preventive health recommendations were provided to patient.     Sheral Flow, LPN   [id number]   Nurse Notes: n/a  Medical screening examination/treatment/procedure(s) were performed by non-physician practitioner and as supervising physician I was immediately available for consultation/collaboration.  I agree with above. Lew Dawes, MD

## 2020-09-08 NOTE — Patient Instructions (Addendum)
Jeffrey Carter , Thank you for taking time to come for your Medicare Wellness Visit. I appreciate your ongoing commitment to your health goals. Please review the following plan we discussed and let me know if I can assist you in the future.   Screening recommendations/referrals: Colonoscopy: last done 06/28/2016; due every 5 years (due 06/28/2021) Recommended yearly ophthalmology/optometry visit for glaucoma screening and checkup Recommended yearly dental visit for hygiene and checkup  Vaccinations: Influenza vaccine: 11/07/2019; due Fall 2022 Pneumococcal vaccine: 02/20/2013, 10/12/2015 Tdap vaccine: 11/25/2010; due every 10 years (due 11/24/2020) Shingles vaccine: never done; Please call your insurance company to determine your out of pocket expense for the Shingrix vaccine. You may receive this vaccine at your local pharmacy.  Covid-19: 03/21/2019, 04/16/2019,   Advanced directives: Yes; documents on file  Conditions/risks identified: Yes; Client understands the importance of follow-up with providers by attending scheduled visits and discussed goals to eat healthier, increase physical activity, exercise the brain, socialize more, get enough sleep and make time for laughter.  Next appointment: Please schedule your next Medicare Wellness Visit with your Nurse Health Advisor in 1 year by calling [phone].  Preventive Care 78 Years and Older, Male Preventive care refers to lifestyle choices and visits with your health care provider that can promote health and wellness. What does preventive care include? A yearly physical exam. This is also called an annual well check. Dental exams once or twice a year. Routine eye exams. Ask your health care provider how often you should have your eyes checked. Personal lifestyle choices, including: Daily care of your teeth and gums. Regular physical activity. Eating a healthy diet. Avoiding tobacco and drug use. Limiting alcohol use. Practicing safe  sex. Taking low doses of aspirin every day. Taking vitamin and mineral supplements as recommended by your health care provider. What happens during an annual well check? The services and screenings done by your health care provider during your annual well check will depend on your age, overall health, lifestyle risk factors, and family history of disease. Counseling  Your health care provider may ask you questions about your: Alcohol use. Tobacco use. Drug use. Emotional well-being. Home and relationship well-being. Sexual activity. Eating habits. History of falls. Memory and ability to understand (cognition). Work and work Statistician. Screening  You may have the following tests or measurements: Height, weight, and BMI. Blood pressure. Lipid and cholesterol levels. These may be checked every 5 years, or more frequently if you are over 67 years old. Skin check. Lung cancer screening. You may have this screening every year starting at age 40 if you have a 30-pack-year history of smoking and currently smoke or have quit within the past 15 years. Fecal occult blood test (FOBT) of the stool. You may have this test every year starting at age 62. Flexible sigmoidoscopy or colonoscopy. You may have a sigmoidoscopy every 5 years or a colonoscopy every 10 years starting at age 78. Prostate cancer screening. Recommendations will vary depending on your family history and other risks. Hepatitis C blood test. Hepatitis B blood test. Sexually transmitted disease (STD) testing. Diabetes screening. This is done by checking your blood sugar (glucose) after you have not eaten for a while (fasting). You may have this done every 1-3 years. Abdominal aortic aneurysm (AAA) screening. You may need this if you are a current or former smoker. Osteoporosis. You may be screened starting at age 51 if you are at high risk. Talk with your health care provider about your test results, treatment  options, and if  necessary, the need for more tests. Vaccines  Your health care provider may recommend certain vaccines, such as: Influenza vaccine. This is recommended every year. Tetanus, diphtheria, and acellular pertussis (Tdap, Td) vaccine. You may need a Td booster every 10 years. Zoster vaccine. You may need this after age 27. Pneumococcal 13-valent conjugate (PCV13) vaccine. One dose is recommended after age 45. Pneumococcal polysaccharide (PPSV23) vaccine. One dose is recommended after age 17. Talk to your health care provider about which screenings and vaccines you need and how often you need them. This information is not intended to replace advice given to you by your health care provider. Make sure you discuss any questions you have with your health care provider. Document Released: 02/06/2015 Document Revised: 09/30/2015 Document Reviewed: 11/11/2014 Elsevier Interactive Patient Education  2017 [place] Prevention in the Home Falls can cause injuries. They can happen to people of all ages. There are many things you can do to make your home safe and to help prevent falls. What can I do on the outside of my home? Regularly fix the edges of walkways and driveways and fix any cracks. Remove anything that might make you trip as you walk through a door, such as a raised step or threshold. Trim any bushes or trees on the path to your home. Use bright outdoor lighting. Clear any walking paths of anything that might make someone trip, such as rocks or tools. Regularly check to see if handrails are loose or broken. Make sure that both sides of any steps have handrails. Any raised decks and porches should have guardrails on the edges. Have any leaves, snow, or ice cleared regularly. Use sand or salt on walking paths during winter. Clean up any spills in your garage right away. This includes oil or grease spills. What can I do in the bathroom? Use night lights. Install grab bars by the toilet  and in the tub and shower. Do not use towel bars as grab bars. Use non-skid mats or decals in the tub or shower. If you need to sit down in the shower, use a plastic, non-slip stool. Keep the floor dry. Clean up any water that spills on the floor as soon as it happens. Remove soap buildup in the tub or shower regularly. Attach bath mats securely with double-sided non-slip rug tape. Do not have throw rugs and other things on the floor that can make you trip. What can I do in the bedroom? Use night lights. Make sure that you have a light by your bed that is easy to reach. Do not use any sheets or blankets that are too big for your bed. They should not hang down onto the floor. Have a firm chair that has side arms. You can use this for support while you get dressed. Do not have throw rugs and other things on the floor that can make you trip. What can I do in the kitchen? Clean up any spills right away. Avoid walking on wet floors. Keep items that you use a lot in easy-to-reach places. If you need to reach something above you, use a strong step stool that has a grab bar. Keep electrical cords out of the way. Do not use floor polish or wax that makes floors slippery. If you must use wax, use non-skid floor wax. Do not have throw rugs and other things on the floor that can make you trip. What can I do with my stairs? Do not  leave any items on the stairs. Make sure that there are handrails on both sides of the stairs and use them. Fix handrails that are broken or loose. Make sure that handrails are as long as the stairways. Check any carpeting to make sure that it is firmly attached to the stairs. Fix any carpet that is loose or worn. Avoid having throw rugs at the top or bottom of the stairs. If you do have throw rugs, attach them to the floor with carpet tape. Make sure that you have a light switch at the top of the stairs and the bottom of the stairs. If you do not have them, ask someone to add  them for you. What else can I do to help prevent falls? Wear shoes that: Do not have high heels. Have rubber bottoms. Are comfortable and fit you well. Are closed at the toe. Do not wear sandals. If you use a stepladder: Make sure that it is fully opened. Do not climb a closed stepladder. Make sure that both sides of the stepladder are locked into place. Ask someone to hold it for you, if possible. Clearly mark and make sure that you can see: Any grab bars or handrails. First and last steps. Where the edge of each step is. Use tools that help you move around (mobility aids) if they are needed. These include: Canes. Walkers. Scooters. Crutches. Turn on the lights when you go into a dark area. Replace any light bulbs as soon as they burn out. Set up your furniture so you have a clear path. Avoid moving your furniture around. If any of your floors are uneven, fix them. If there are any pets around you, be aware of where they are. Review your medicines with your doctor. Some medicines can make you feel dizzy. This can increase your chance of falling. Ask your doctor what other things that you can do to help prevent falls. This information is not intended to replace advice given to you by your health care provider. Make sure you discuss any questions you have with your health care provider. Document Released: 11/06/2008 Document Revised: 06/18/2015 Document Reviewed: 02/14/2014 Elsevier Interactive Patient Education  2017 [employer].

## 2020-09-20 DIAGNOSIS — G4733 Obstructive sleep apnea (adult) (pediatric): Secondary | ICD-10-CM | POA: Diagnosis not present

## 2020-10-05 ENCOUNTER — Other Ambulatory Visit: Payer: Self-pay

## 2020-10-05 ENCOUNTER — Ambulatory Visit (INDEPENDENT_AMBULATORY_CARE_PROVIDER_SITE_OTHER): Payer: Medicare Other | Admitting: *Deleted

## 2020-10-05 DIAGNOSIS — Z23 Encounter for immunization: Secondary | ICD-10-CM | POA: Diagnosis not present

## 2020-10-21 DIAGNOSIS — G4733 Obstructive sleep apnea (adult) (pediatric): Secondary | ICD-10-CM | POA: Diagnosis not present

## 2020-11-02 ENCOUNTER — Encounter: Payer: Self-pay | Admitting: Pulmonary Disease

## 2020-11-02 ENCOUNTER — Other Ambulatory Visit: Payer: Self-pay

## 2020-11-02 ENCOUNTER — Ambulatory Visit: Payer: Medicare Other | Admitting: Pulmonary Disease

## 2020-11-02 VITALS — BP 150/88 | HR 67 | Temp 97.7°F | Ht 69.0 in | Wt 245.2 lb

## 2020-11-02 DIAGNOSIS — E669 Obesity, unspecified: Secondary | ICD-10-CM | POA: Diagnosis not present

## 2020-11-02 DIAGNOSIS — Z9989 Dependence on other enabling machines and devices: Secondary | ICD-10-CM | POA: Diagnosis not present

## 2020-11-02 DIAGNOSIS — G4733 Obstructive sleep apnea (adult) (pediatric): Secondary | ICD-10-CM

## 2020-11-02 DIAGNOSIS — G4730 Sleep apnea, unspecified: Secondary | ICD-10-CM | POA: Diagnosis not present

## 2020-11-02 NOTE — Progress Notes (Signed)
[facility]  Chief Complaint  Patient presents with   Follow-up    OSA-     Constitutional:  BP (!) 150/88 (BP Location: Left Arm, Patient Position: Sitting, Cuff Size: Normal)   Pulse 67   Temp 97.7 F (36.5 C) (Oral)   Ht 5\' 9"  (1.753 m)   Wt 245 lb 3.2 oz (111.2 kg)   SpO2 100%   BMI 36.21 kg/m   Past Medical History:  Prostate cancer, PACs, OA, HTN, HLD, Colon polyps, Gout, BPH  Past Surgical History:  His  has a past surgical history that includes Total knee arthroplasty (Right, 06-22-2007); Colonoscopy (07-06-2006); Tonsillectomy (as child); Appendectomy (1966); Prostate biopsy (N/A, 03/28/2014); Gold seed implant (N/A, 08/12/2014); and Green light laser turp (transurethral resection of prostate (N/A, 07/20/2015).  Brief Summary:  Jeffrey Carter is a 80 y.o. male with obstructive sleep apnea.      Subjective:   He has been doing well with CPAP.  He has full face mask.  No issues with sinus congestion, sore throat, dry mouth, or aerophagia.  Feels rested during the day.  Already got his flu shot this year.  Physical Exam:   Appearance - well kempt   ENMT - no sinus tenderness, no oral exudate, no LAN, Mallampati 4 airway, no stridor, poor dentition  Respiratory - equal breath sounds bilaterally, no wheezing or rales  CV - s1s2 regular rate and rhythm, no murmurs  Ext - no clubbing, no edema  Skin - no rashes  Psych - normal mood and affect    Sleep Tests:  PSG 02/29/12 >> AHI 32, SpO2 low 79% CPAP 10/03/20 to 11/01/20 >> used on 30 of 30 nights with average 7 hrs 13 min.  Average AHI 1.6 with CPAP 12 cm H2O  Social History:  He  reports that he quit smoking about 58 years ago. His smoking use included cigarettes. He has a 2.00 pack-year smoking history. He has never used smokeless tobacco. He reports that he does not drink alcohol and does not use drugs.  Family History:  His family history includes Cancer in  his brother; Diabetes in his father.     Assessment/Plan:   Obstructive sleep apnea. - he is compliant with CPAP and reports benefit from therapy - uses [employer] for his DME - continue CPAP 12 cm H2O  Obesity. - reviewed importance of weight loss  Time Spent Involved in Patient Care on Day of Examination:  22 minutes  Follow up:   Patient Instructions  Follow up in 1 year  Medication List:   Allergies as of 11/02/2020       Reactions   Percocet [oxycodone-acetaminophen]    nausea        Medication List        Accurate as of November 02, 2020  5:06 PM. If you have any questions, ask your nurse or doctor.          acetaminophen 325 MG tablet Commonly known as: TYLENOL Take 2 tablets (650 mg total) by mouth every 8 (eight) hours.   amLODipine 5 MG tablet Commonly known as: NORVASC Take 1 tablet by mouth once daily   cloNIDine 0.3 MG tablet Commonly known as: CATAPRES Take 1 tablet by mouth twice daily   EQ Allergy Relief 10 MG tablet Generic drug: loratadine TAKE ONE TABLET BY MOUTH ONCE DAILY   fluticasone 50 MCG/ACT nasal spray Commonly known as: FLONASE Place 2 sprays into both nostrils daily.  What changed:  when to take this reasons to take this   furosemide 40 MG tablet Commonly known as: LASIX Take 1 tablet by mouth once daily   HYDROcodone-acetaminophen 7.5-325 MG tablet Commonly known as: NORCO Take 1 tablet by mouth every 6 (six) hours as needed for moderate pain.   losartan 100 MG tablet Commonly known as: COZAAR Take 1 tablet by mouth once daily   lovastatin 40 MG tablet Commonly known as: MEVACOR TAKE 1 TABLET BY MOUTH ONCE DAILY 6 IN THE EVENING   meloxicam 15 MG tablet Commonly known as: MOBIC Take 1 tablet (15 mg total) by mouth daily.   Potassium Chloride ER 20 MEQ Tbcr Take 1 tablet by mouth once daily   sildenafil 100 MG tablet Commonly known as: Viagra Take 1 tablet (100 mg total) by mouth as needed for erectile  dysfunction.   tamsulosin 0.4 MG Caps capsule Commonly known as: FLOMAX TAKE 1 CAPSULE BY MOUTH  DAILY What changed: when to take this        Signature:  Chesley Mires, MD [place] Pager - [phone] 11/02/2020, 5:06 PM

## 2020-11-02 NOTE — Patient Instructions (Signed)
Follow up in 1 year.

## 2020-11-05 ENCOUNTER — Encounter: Payer: Self-pay | Admitting: Internal Medicine

## 2020-11-05 ENCOUNTER — Other Ambulatory Visit: Payer: Self-pay

## 2020-11-05 ENCOUNTER — Ambulatory Visit (INDEPENDENT_AMBULATORY_CARE_PROVIDER_SITE_OTHER): Payer: Medicare Other | Admitting: Internal Medicine

## 2020-11-05 DIAGNOSIS — M1000 Idiopathic gout, unspecified site: Secondary | ICD-10-CM

## 2020-11-05 DIAGNOSIS — R739 Hyperglycemia, unspecified: Secondary | ICD-10-CM

## 2020-11-05 DIAGNOSIS — I10 Essential (primary) hypertension: Secondary | ICD-10-CM

## 2020-11-05 MED ORDER — PREDNISONE 10 MG PO TABS: ORAL_TABLET | ORAL | 1 refills | Status: DC

## 2020-11-05 MED ORDER — KETOROLAC TROMETHAMINE 30 MG/ML IJ SOLN
30.0000 mg | Freq: Once | INTRAMUSCULAR | Status: AC
  Administered 2020-11-05: 30 mg via INTRAMUSCULAR

## 2020-11-05 MED ORDER — HYDROCODONE-ACETAMINOPHEN 7.5-325 MG PO TABS: 1.0000 | ORAL_TABLET | Freq: Four times a day (QID) | ORAL | 0 refills | Status: DC | PRN

## 2020-11-05 NOTE — Assessment & Plan Note (Signed)
BP Readings from Last 3 Encounters:  11/05/20 (!) 130/92  11/02/20 (!) 150/88  09/08/20 (!) 162/90   Cont on Amlodipine, Catapress, Furosemide, Losartan

## 2020-11-05 NOTE — Assessment & Plan Note (Addendum)
L 1st MTP Prednisone po prn - see Rx Norco prn  Potential benefits of a short term opioids use as well as potential risks (i.e. addiction risk, apnea etc) and complications (i.e. Somnolence, constipation and others) were explained to the patient and were aknowledged.  Toradol IM

## 2020-11-05 NOTE — Assessment & Plan Note (Signed)
Steroids may cause elevation of glucose - d/w pt

## 2020-11-05 NOTE — Progress Notes (Signed)
Subjective:  Patient ID: Jeffrey Carter, male    DOB: [birth year]  Age: 80 y.o. MRN: [id number]  CC: Foot Swelling ((L) foot, and it hurt to walk)   HPI Jeffrey Carter presents for severe L foot pain x 2 d. F/u on HTN He is here w/his son Merry Proud  Outpatient Medications Prior to Visit  Medication Sig Dispense Refill   acetaminophen (TYLENOL) 325 MG tablet Take 2 tablets (650 mg total) by mouth every 8 (eight) hours. 30 tablet 0   amLODipine (NORVASC) 5 MG tablet Take 1 tablet by mouth once daily 90 tablet 3   cloNIDine (CATAPRES) 0.3 MG tablet Take 1 tablet by mouth twice daily 180 tablet 3   EQ ALLERGY RELIEF 10 MG tablet TAKE ONE TABLET BY MOUTH ONCE DAILY 90 tablet 3   fluticasone (FLONASE) 50 MCG/ACT nasal spray Place 2 sprays into both nostrils daily. (Patient taking differently: Place 2 sprays into both nostrils daily as needed for allergies.) 16 g 2   furosemide (LASIX) 40 MG tablet Take 1 tablet by mouth once daily 90 tablet 3   losartan (COZAAR) 100 MG tablet Take 1 tablet by mouth once daily 90 tablet 3   lovastatin (MEVACOR) 40 MG tablet TAKE 1 TABLET BY MOUTH ONCE DAILY 6 IN THE EVENING 90 tablet 3   Potassium Chloride ER 20 MEQ TBCR Take 1 tablet by mouth once daily 90 tablet 3   sildenafil (VIAGRA) 100 MG tablet Take 1 tablet (100 mg total) by mouth as needed for erectile dysfunction. 12 tablet 5   tamsulosin (FLOMAX) 0.4 MG CAPS capsule TAKE 1 CAPSULE BY MOUTH  DAILY (Patient taking differently: Take 0.4 mg by mouth 2 (two) times daily.) 90 capsule 1   HYDROcodone-acetaminophen (NORCO) 7.5-325 MG tablet Take 1 tablet by mouth every 6 (six) hours as needed for moderate pain. 20 tablet 0   meloxicam (MOBIC) 15 MG tablet Take 1 tablet (15 mg total) by mouth daily. 30 tablet 0   No facility-administered medications prior to visit.    ROS: Review of Systems  Constitutional:  Negative for appetite change, fatigue and unexpected weight change.  HENT:  Negative for  congestion, nosebleeds, sneezing, sore throat and trouble swallowing.   Eyes:  Negative for itching and visual disturbance.  Respiratory:  Negative for cough.   Cardiovascular:  Negative for chest pain, palpitations and leg swelling.  Gastrointestinal:  Negative for abdominal distention, blood in stool, diarrhea and nausea.  Genitourinary:  Negative for frequency and hematuria.  Musculoskeletal:  Positive for arthralgias and gait problem. Negative for back pain, joint swelling and neck pain.  Skin:  Negative for rash.  Neurological:  Negative for dizziness, tremors, speech difficulty and weakness.  Psychiatric/Behavioral:  Negative for agitation, dysphoric mood and sleep disturbance. The patient is not nervous/anxious.    Objective:  BP (!) 130/92 (BP Location: Left Arm)   Pulse 76   Temp 98.5 F (36.9 C) (Oral)   SpO2 95%   BP Readings from Last 3 Encounters:  11/05/20 (!) 130/92  11/02/20 (!) 150/88  09/08/20 (!) 162/90    Wt Readings from Last 3 Encounters:  11/02/20 245 lb 3.2 oz (111.2 kg)  09/08/20 235 lb 15 oz (107 kg)  09/08/20 236 lb 6.4 oz (107.2 kg)    Physical Exam Constitutional:      General: He is not in acute distress.    Appearance: He is well-developed. He is obese.     Comments: NAD  Eyes:  Conjunctiva/sclera: Conjunctivae normal.     Pupils: Pupils are equal, round, and reactive to light.  Neck:     Thyroid: No thyromegaly.     Vascular: No JVD.  Cardiovascular:     Rate and Rhythm: Normal rate and regular rhythm.     Heart sounds: Normal heart sounds. No murmur heard.   No friction rub. No gallop.  Pulmonary:     Effort: Pulmonary effort is normal. No respiratory distress.     Breath sounds: Normal breath sounds. No wheezing or rales.  Chest:     Chest wall: No tenderness.  Abdominal:     General: Bowel sounds are normal. There is no distension.     Palpations: Abdomen is soft. There is no mass.     Tenderness: There is no abdominal  tenderness. There is no guarding or rebound.  Musculoskeletal:        General: Tenderness present. Normal range of motion.     Cervical back: Normal range of motion.  Lymphadenopathy:     Cervical: No cervical adenopathy.  Skin:    General: Skin is warm and dry.     Findings: No rash.  Neurological:     Mental Status: He is alert and oriented to person, place, and time.     Cranial Nerves: No cranial nerve deficit.     Motor: No abnormal muscle tone.     Coordination: Coordination normal.     Gait: Gait abnormal.     Deep Tendon Reflexes: Reflexes are normal and symmetric.  Psychiatric:        Behavior: Behavior normal.        Thought Content: Thought content normal.        Judgment: Judgment normal.   L 1st MTP w/pain  In a w/c due to pain   Lab Results  Component Value Date   WBC 3.4 (L) 10/15/2019   HGB 13.2 10/15/2019   HCT 39.9 10/15/2019   PLT 161 10/15/2019   GLUCOSE 87 04/28/2020   CHOL 131 10/15/2019   TRIG 67 10/15/2019   HDL 43 10/15/2019   LDLCALC 74 10/15/2019   ALT 15 04/28/2020   AST 14 04/28/2020   NA 139 04/28/2020   K 4.1 04/28/2020   CL 102 04/28/2020   CREATININE 1.12 04/28/2020   BUN 12 04/28/2020   CO2 31 04/28/2020   TSH 2.26 10/15/2019   PSA 0.11 10/15/2019   INR 1.01 10/12/2014   HGBA1C 5.6 09/08/2020   HGBA1C 5.6 09/08/2020   HGBA1C 5.6 (A) 09/08/2020   HGBA1C 5.6 09/08/2020    DG Foot Complete Left  Result Date: 06/07/2019 CLINICAL DATA:  Left heel pain for 1 week common no known injury, initial encounter EXAM: LEFT FOOT - COMPLETE 3+ VIEW COMPARISON:  None. FINDINGS: No acute fracture or dislocation is noted. Calcaneal spurs are seen. No soft tissue abnormality is noted. Mild hallux valgus deformity is seen. IMPRESSION: Degenerative changes without acute abnormality. Electronically Signed   By: Inez Catalina M.D.   On: 06/07/2019 23:03    Assessment & Plan:   Problem List Items Addressed This Visit     Essential hypertension     BP Readings from Last 3 Encounters:  11/05/20 (!) 130/92  11/02/20 (!) 150/88  09/08/20 (!) 162/90  Cont on Amlodipine, Catapress, Furosemide, Losartan      Gout attack    L 1st MTP Prednisone po prn - see Rx Norco prn  Potential benefits of a short term opioids use as  well as potential risks (i.e. addiction risk, apnea etc) and complications (i.e. Somnolence, constipation and others) were explained to the patient and were aknowledged.  Toradol IM      Hyperglycemia    Steroids may cause elevation of glucose - d/w pt         Follow-up: Return in about 3 months (around 02/05/2021) for a follow-up visit.  [name], MD

## 2020-11-12 DIAGNOSIS — G4733 Obstructive sleep apnea (adult) (pediatric): Secondary | ICD-10-CM | POA: Diagnosis not present

## 2020-11-20 DIAGNOSIS — G4733 Obstructive sleep apnea (adult) (pediatric): Secondary | ICD-10-CM | POA: Diagnosis not present

## 2020-12-01 ENCOUNTER — Ambulatory Visit: Payer: Medicare Other | Admitting: Internal Medicine

## 2020-12-01 DIAGNOSIS — H35033 Hypertensive retinopathy, bilateral: Secondary | ICD-10-CM | POA: Diagnosis not present

## 2020-12-01 DIAGNOSIS — H35363 Drusen (degenerative) of macula, bilateral: Secondary | ICD-10-CM | POA: Diagnosis not present

## 2020-12-08 ENCOUNTER — Ambulatory Visit (INDEPENDENT_AMBULATORY_CARE_PROVIDER_SITE_OTHER): Payer: Medicare Other | Admitting: Internal Medicine

## 2020-12-08 ENCOUNTER — Encounter: Payer: Self-pay | Admitting: Internal Medicine

## 2020-12-08 ENCOUNTER — Other Ambulatory Visit: Payer: Self-pay

## 2020-12-08 DIAGNOSIS — R739 Hyperglycemia, unspecified: Secondary | ICD-10-CM | POA: Diagnosis not present

## 2020-12-08 DIAGNOSIS — I10 Essential (primary) hypertension: Secondary | ICD-10-CM | POA: Diagnosis not present

## 2020-12-08 DIAGNOSIS — E785 Hyperlipidemia, unspecified: Secondary | ICD-10-CM

## 2020-12-08 DIAGNOSIS — Z86010 Personal history of colonic polyps: Secondary | ICD-10-CM | POA: Diagnosis not present

## 2020-12-08 DIAGNOSIS — M1000 Idiopathic gout, unspecified site: Secondary | ICD-10-CM

## 2020-12-08 LAB — BASIC METABOLIC PANEL
BUN: 11 mg/dL (ref 6–23)
CO2: 29 mEq/L (ref 19–32)
Calcium: 8.8 mg/dL (ref 8.4–10.5)
Chloride: 105 mEq/L (ref 96–112)
Creatinine, Ser: 1.01 mg/dL (ref 0.40–1.50)
GFR: 71.2 mL/min (ref >60.00)
Glucose, Bld: 102 mg/dL — ABNORMAL HIGH (ref 70–99)
Potassium: 3.5 mEq/L (ref 3.5–5.1)
Sodium: 141 mEq/L (ref 135–145)

## 2020-12-08 LAB — HEMOGLOBIN A1C: Hgb A1c MFr Bld: 5.7 % (ref 4.6–6.5)

## 2020-12-08 MED ORDER — PREDNISONE 10 MG PO TABS: ORAL_TABLET | ORAL | 1 refills | Status: DC

## 2020-12-08 NOTE — Assessment & Plan Note (Signed)
Chronic - stable Cont on Lovastatin 20 mg daily

## 2020-12-08 NOTE — Assessment & Plan Note (Signed)
Due colon in 2023

## 2020-12-08 NOTE — Addendum Note (Signed)
Addended by: Jacobo Forest on: 12/08/2020 04:03 PM   Modules accepted: Orders

## 2020-12-08 NOTE — Progress Notes (Signed)
Subjective:  Patient ID: Jeffrey Carter, male    DOB: 08/19/1940  Age: 80 y.o. MRN: [id number]  CC: Follow-up (3 month f/u)   HPI Jeffrey Carter presents for HTN, gout, prostatism/prostate cancer, colon polyps  Outpatient Medications Prior to Visit  Medication Sig Dispense Refill   acetaminophen (TYLENOL) 325 MG tablet Take 2 tablets (650 mg total) by mouth every 8 (eight) hours. 30 tablet 0   amLODipine (NORVASC) 5 MG tablet Take 1 tablet by mouth once daily 90 tablet 3   cloNIDine (CATAPRES) 0.3 MG tablet Take 1 tablet by mouth twice daily 180 tablet 3   EQ ALLERGY RELIEF 10 MG tablet TAKE ONE TABLET BY MOUTH ONCE DAILY 90 tablet 3   fluticasone (FLONASE) 50 MCG/ACT nasal spray Place 2 sprays into both nostrils daily. (Patient taking differently: Place 2 sprays into both nostrils daily as needed for allergies.) 16 g 2   furosemide (LASIX) 40 MG tablet Take 1 tablet by mouth once daily 90 tablet 3   losartan (COZAAR) 100 MG tablet Take 1 tablet by mouth once daily 90 tablet 3   lovastatin (MEVACOR) 40 MG tablet TAKE 1 TABLET BY MOUTH ONCE DAILY 6 IN THE EVENING 90 tablet 3   Potassium Chloride ER 20 MEQ TBCR Take 1 tablet by mouth once daily 90 tablet 3   sildenafil (VIAGRA) 100 MG tablet Take 1 tablet (100 mg total) by mouth as needed for erectile dysfunction. 12 tablet 5   tamsulosin (FLOMAX) 0.4 MG CAPS capsule TAKE 1 CAPSULE BY MOUTH  DAILY (Patient taking differently: Take 0.4 mg by mouth 2 (two) times daily.) 90 capsule 1   HYDROcodone-acetaminophen (NORCO) 7.5-325 MG tablet Take 1 tablet by mouth every 6 (six) hours as needed for moderate pain. 20 tablet 0   predniSONE (DELTASONE) 10 MG tablet Take 30 mg on day 1, 20 mg on day 2 and 10 mg on day 3 prn gout attack. Tale pc 18 tablet 1   No facility-administered medications prior to visit.    ROS: Review of Systems  Constitutional:  Negative for appetite change, fatigue and unexpected weight change.  HENT:  Negative for  congestion, nosebleeds, sneezing, sore throat and trouble swallowing.   Eyes:  Negative for itching and visual disturbance.  Respiratory:  Negative for cough.   Cardiovascular:  Negative for chest pain, palpitations and leg swelling.  Gastrointestinal:  Negative for abdominal distention, blood in stool, diarrhea and nausea.  Genitourinary:  Negative for frequency and hematuria.  Musculoskeletal:  Positive for arthralgias. Negative for back pain, gait problem, joint swelling and neck pain.  Skin:  Negative for rash.  Neurological:  Negative for dizziness, tremors, speech difficulty and weakness.  Psychiatric/Behavioral:  Negative for agitation, dysphoric mood and sleep disturbance. The patient is not nervous/anxious.    Objective:  BP 132/78 (BP Location: Left Arm)   Pulse 76   Temp 98 F (36.7 C) (Oral)   Ht 5\' 9"  (1.753 m)   Wt 243 lb (110.2 kg)   SpO2 96%   BMI 35.88 kg/m   BP Readings from Last 3 Encounters:  12/08/20 132/78  11/05/20 (!) 130/92  11/02/20 (!) 150/88    Wt Readings from Last 3 Encounters:  12/08/20 243 lb (110.2 kg)  11/02/20 245 lb 3.2 oz (111.2 kg)  09/08/20 235 lb 15 oz (107 kg)    Physical Exam Constitutional:      General: He is not in acute distress.    Appearance: He is well-developed.  He is obese.     Comments: NAD  Eyes:     Conjunctiva/sclera: Conjunctivae normal.     Pupils: Pupils are equal, round, and reactive to light.  Neck:     Thyroid: No thyromegaly.     Vascular: No JVD.  Cardiovascular:     Rate and Rhythm: Normal rate and regular rhythm.     Heart sounds: Normal heart sounds. No murmur heard.   No friction rub. No gallop.  Pulmonary:     Effort: Pulmonary effort is normal. No respiratory distress.     Breath sounds: Normal breath sounds. No wheezing or rales.  Chest:     Chest wall: No tenderness.  Abdominal:     General: Bowel sounds are normal. There is no distension.     Palpations: Abdomen is soft. There is no mass.      Tenderness: There is no abdominal tenderness. There is no guarding or rebound.  Musculoskeletal:        General: No tenderness. Normal range of motion.     Cervical back: Normal range of motion.  Lymphadenopathy:     Cervical: No cervical adenopathy.  Skin:    General: Skin is warm and dry.     Findings: No rash.  Neurological:     Mental Status: He is alert and oriented to person, place, and time.     Cranial Nerves: No cranial nerve deficit.     Motor: No abnormal muscle tone.     Coordination: Coordination normal.     Gait: Gait normal.     Deep Tendon Reflexes: Reflexes are normal and symmetric.  Psychiatric:        Behavior: Behavior normal.        Thought Content: Thought content normal.        Judgment: Judgment normal.    Lab Results  Component Value Date   WBC 3.4 (L) 10/15/2019   HGB 13.2 10/15/2019   HCT 39.9 10/15/2019   PLT 161 10/15/2019   GLUCOSE 87 04/28/2020   CHOL 131 10/15/2019   TRIG 67 10/15/2019   HDL 43 10/15/2019   LDLCALC 74 10/15/2019   ALT 15 04/28/2020   AST 14 04/28/2020   NA 139 04/28/2020   K 4.1 04/28/2020   CL 102 04/28/2020   CREATININE 1.12 04/28/2020   BUN 12 04/28/2020   CO2 31 04/28/2020   TSH 2.26 10/15/2019   PSA 0.11 10/15/2019   INR 1.01 10/12/2014   HGBA1C 5.6 09/08/2020   HGBA1C 5.6 09/08/2020   HGBA1C 5.6 (A) 09/08/2020   HGBA1C 5.6 09/08/2020    DG Foot Complete Left  Result Date: 06/07/2019 CLINICAL DATA:  Left heel pain for 1 week common no known injury, initial encounter EXAM: LEFT FOOT - COMPLETE 3+ VIEW COMPARISON:  None. FINDINGS: No acute fracture or dislocation is noted. Calcaneal spurs are seen. No soft tissue abnormality is noted. Mild hallux valgus deformity is seen. IMPRESSION: Degenerative changes without acute abnormality. Electronically Signed   By: Inez Catalina M.D.   On: 06/07/2019 23:03    Assessment & Plan:   Problem List Items Addressed This Visit     Dyslipidemia    Chronic -  stable Cont on Lovastatin 20 mg daily      Essential hypertension    Stable Continue to take Amlodipine, Catapress, Furosemide, Losartan      Gout attack    No recent relapse Have on hand: Prednisone po prn - see Rx, Norco prn  Potential benefits  of a short term opioids use as well as potential risks (i.e. addiction risk, apnea etc) and complications (i.e. Somnolence, constipation and others) were explained to the patient and were aknowledged.      Hx of adenomatous colonic polyps    Due colon in 2023         Meds ordered this encounter  Medications   predniSONE (DELTASONE) 10 MG tablet    Sig: Take 30 mg on day 1, 20 mg on day 2 and 10 mg on day 3 prn gout attack. Tale pc    Dispense:  18 tablet    Refill:  1      Follow-up: No follow-ups on file.  [name], MD

## 2020-12-08 NOTE — Assessment & Plan Note (Signed)
Stable Continue to take Amlodipine, Catapress, Furosemide, Losartan

## 2020-12-08 NOTE — Assessment & Plan Note (Signed)
No recent relapse Have on hand: Prednisone po prn - see Rx, Norco prn  Potential benefits of a short term opioids use as well as potential risks (i.e. addiction risk, apnea etc) and complications (i.e. Somnolence, constipation and others) were explained to the patient and were aknowledged.

## 2020-12-09 ENCOUNTER — Ambulatory Visit: Payer: Medicare Other | Admitting: Internal Medicine

## 2020-12-21 DIAGNOSIS — G4733 Obstructive sleep apnea (adult) (pediatric): Secondary | ICD-10-CM | POA: Diagnosis not present

## 2021-01-27 DIAGNOSIS — R8271 Bacteriuria: Secondary | ICD-10-CM | POA: Diagnosis not present

## 2021-01-27 DIAGNOSIS — N453 Epididymo-orchitis: Secondary | ICD-10-CM | POA: Diagnosis not present

## 2021-02-01 DIAGNOSIS — N453 Epididymo-orchitis: Secondary | ICD-10-CM | POA: Diagnosis not present

## 2021-02-09 ENCOUNTER — Other Ambulatory Visit: Payer: Self-pay

## 2021-02-09 ENCOUNTER — Encounter: Payer: Self-pay | Admitting: Internal Medicine

## 2021-02-09 ENCOUNTER — Ambulatory Visit (INDEPENDENT_AMBULATORY_CARE_PROVIDER_SITE_OTHER): Payer: Medicare Other | Admitting: Internal Medicine

## 2021-02-09 DIAGNOSIS — L608 Other nail disorders: Secondary | ICD-10-CM | POA: Diagnosis not present

## 2021-02-09 DIAGNOSIS — I10 Essential (primary) hypertension: Secondary | ICD-10-CM

## 2021-02-09 DIAGNOSIS — M1000 Idiopathic gout, unspecified site: Secondary | ICD-10-CM

## 2021-02-09 DIAGNOSIS — C61 Malignant neoplasm of prostate: Secondary | ICD-10-CM

## 2021-02-09 DIAGNOSIS — R739 Hyperglycemia, unspecified: Secondary | ICD-10-CM

## 2021-02-09 MED ORDER — HYDROCODONE-ACETAMINOPHEN 7.5-325 MG PO TABS: 1.0000 | ORAL_TABLET | Freq: Four times a day (QID) | ORAL | 0 refills | Status: DC | PRN

## 2021-02-09 MED ORDER — PREDNISONE 10 MG PO TABS: ORAL_TABLET | ORAL | 1 refills | Status: DC

## 2021-02-09 NOTE — Assessment & Plan Note (Signed)
PSA q 1 year

## 2021-02-09 NOTE — Assessment & Plan Note (Signed)
Recurrent. Pt is to have on hand: Prednisone po prn - see Rx, Norco prn  Potential benefits of a short term opioids use as well as potential risks (i.e. addiction risk, apnea etc) and complications (i.e. Somnolence, constipation and others) were explained to the patient and were aknowledged.

## 2021-02-09 NOTE — Progress Notes (Signed)
Subjective:  Patient ID: Jeffrey Carter, male    DOB: 1940/03/05  Age: 81 y.o. MRN: [id number]  CC: Follow-up (3 month f/u)   HPI KVEON CASANAS presents for HTN, gout, OA and ED C/o L big toenail is dark - new  Outpatient Medications Prior to Visit  Medication Sig Dispense Refill   acetaminophen (TYLENOL) 325 MG tablet Take 2 tablets (650 mg total) by mouth every 8 (eight) hours. 30 tablet 0   amLODipine (NORVASC) 5 MG tablet Take 1 tablet by mouth once daily 90 tablet 3   cloNIDine (CATAPRES) 0.3 MG tablet Take 1 tablet by mouth twice daily 180 tablet 3   EQ ALLERGY RELIEF 10 MG tablet TAKE ONE TABLET BY MOUTH ONCE DAILY 90 tablet 3   fluticasone (FLONASE) 50 MCG/ACT nasal spray Place 2 sprays into both nostrils daily. (Patient taking differently: Place 2 sprays into both nostrils daily as needed for allergies.) 16 g 2   furosemide (LASIX) 40 MG tablet Take 1 tablet by mouth once daily 90 tablet 3   losartan (COZAAR) 100 MG tablet Take 1 tablet by mouth once daily 90 tablet 3   lovastatin (MEVACOR) 40 MG tablet TAKE 1 TABLET BY MOUTH ONCE DAILY 6 IN THE EVENING 90 tablet 3   Potassium Chloride ER 20 MEQ TBCR Take 1 tablet by mouth once daily 90 tablet 3   predniSONE (DELTASONE) 10 MG tablet Take 30 mg on day 1, 20 mg on day 2 and 10 mg on day 3 prn gout attack. Tale pc 18 tablet 1   sildenafil (VIAGRA) 100 MG tablet Take 1 tablet (100 mg total) by mouth as needed for erectile dysfunction. 12 tablet 5   tamsulosin (FLOMAX) 0.4 MG CAPS capsule TAKE 1 CAPSULE BY MOUTH  DAILY (Patient taking differently: Take 0.4 mg by mouth 2 (two) times daily.) 90 capsule 1   No facility-administered medications prior to visit.    ROS: Review of Systems  Constitutional:  Negative for appetite change, fatigue and unexpected weight change.  HENT:  Negative for congestion, nosebleeds, sneezing, sore throat and trouble swallowing.   Eyes:  Negative for itching and visual disturbance.  Respiratory:   Negative for cough.   Cardiovascular:  Negative for chest pain, palpitations and leg swelling.  Gastrointestinal:  Negative for abdominal distention, blood in stool, diarrhea and nausea.  Genitourinary:  Negative for frequency and hematuria.  Musculoskeletal:  Positive for arthralgias, back pain and gait problem. Negative for joint swelling and neck pain.  Skin:  Negative for rash.  Neurological:  Negative for dizziness, tremors, speech difficulty and weakness.  Psychiatric/Behavioral:  Negative for agitation, dysphoric mood and sleep disturbance. The patient is not nervous/anxious.    Objective:  BP (!) 142/86 (BP Location: Left Arm)    Pulse 79    Temp 98.1 F (36.7 C) (Oral)    Ht 5\' 9"  (1.753 m)    Wt 238 lb 9.6 oz (108.2 kg)    SpO2 97%    BMI 35.24 kg/m   BP Readings from Last 3 Encounters:  02/09/21 (!) 142/86  12/08/20 132/78  11/05/20 (!) 130/92    Wt Readings from Last 3 Encounters:  02/09/21 238 lb 9.6 oz (108.2 kg)  12/08/20 243 lb (110.2 kg)  11/02/20 245 lb 3.2 oz (111.2 kg)    Physical Exam Constitutional:      General: He is not in acute distress.    Appearance: He is well-developed.     Comments: NAD  Eyes:     Conjunctiva/sclera: Conjunctivae normal.     Pupils: Pupils are equal, round, and reactive to light.  Neck:     Thyroid: No thyromegaly.     Vascular: No JVD.  Cardiovascular:     Rate and Rhythm: Normal rate and regular rhythm.     Heart sounds: Normal heart sounds. No murmur heard.   No friction rub. No gallop.  Pulmonary:     Effort: Pulmonary effort is normal. No respiratory distress.     Breath sounds: Normal breath sounds. No wheezing or rales.  Chest:     Chest wall: No tenderness.  Abdominal:     General: Bowel sounds are normal. There is no distension.     Palpations: Abdomen is soft. There is no mass.     Tenderness: There is no abdominal tenderness. There is no guarding or rebound.  Musculoskeletal:        General: No tenderness.  Normal range of motion.     Cervical back: Normal range of motion.  Lymphadenopathy:     Cervical: No cervical adenopathy.  Skin:    General: Skin is warm and dry.     Findings: No rash.  Neurological:     Mental Status: He is alert and oriented to person, place, and time.     Cranial Nerves: No cranial nerve deficit.     Motor: No abnormal muscle tone.     Coordination: Coordination normal.     Gait: Gait normal.     Deep Tendon Reflexes: Reflexes are normal and symmetric.  Psychiatric:        Behavior: Behavior normal.        Thought Content: Thought content normal.        Judgment: Judgment normal.  L big toenail is dark  Lab Results  Component Value Date   WBC 3.4 (L) 10/15/2019   HGB 13.2 10/15/2019   HCT 39.9 10/15/2019   PLT 161 10/15/2019   GLUCOSE 102 (H) 12/08/2020   CHOL 131 10/15/2019   TRIG 67 10/15/2019   HDL 43 10/15/2019   LDLCALC 74 10/15/2019   ALT 15 04/28/2020   AST 14 04/28/2020   NA 141 12/08/2020   K 3.5 12/08/2020   CL 105 12/08/2020   CREATININE 1.01 12/08/2020   BUN 11 12/08/2020   CO2 29 12/08/2020   TSH 2.26 10/15/2019   PSA 0.11 10/15/2019   INR 1.01 10/12/2014   HGBA1C 5.7 12/08/2020    DG Foot Complete Left  Result Date: 06/07/2019 CLINICAL DATA:  Left heel pain for 1 week common no known injury, initial encounter EXAM: LEFT FOOT - COMPLETE 3+ VIEW COMPARISON:  None. FINDINGS: No acute fracture or dislocation is noted. Calcaneal spurs are seen. No soft tissue abnormality is noted. Mild hallux valgus deformity is seen. IMPRESSION: Degenerative changes without acute abnormality. Electronically Signed   By: Inez Catalina M.D.   On: 06/07/2019 23:03    Assessment & Plan:   Problem List Items Addressed This Visit   None     No orders of the defined types were placed in this encounter.     Follow-up: No follow-ups on file.  [name], MD

## 2021-02-09 NOTE — Assessment & Plan Note (Signed)
L big toenail is dark - will watch

## 2021-02-09 NOTE — Assessment & Plan Note (Signed)
Check A1c Pt lost wt

## 2021-02-10 DIAGNOSIS — G4733 Obstructive sleep apnea (adult) (pediatric): Secondary | ICD-10-CM | POA: Diagnosis not present

## 2021-03-10 ENCOUNTER — Ambulatory Visit (INDEPENDENT_AMBULATORY_CARE_PROVIDER_SITE_OTHER): Payer: Medicare Other | Admitting: Internal Medicine

## 2021-03-10 ENCOUNTER — Other Ambulatory Visit: Payer: Self-pay

## 2021-03-10 ENCOUNTER — Encounter: Payer: Self-pay | Admitting: Internal Medicine

## 2021-03-10 VITALS — BP 160/110 | HR 74 | Temp 98.7°F | Ht 69.0 in | Wt 239.0 lb

## 2021-03-10 DIAGNOSIS — C61 Malignant neoplasm of prostate: Secondary | ICD-10-CM

## 2021-03-10 DIAGNOSIS — R739 Hyperglycemia, unspecified: Secondary | ICD-10-CM | POA: Diagnosis not present

## 2021-03-10 DIAGNOSIS — E785 Hyperlipidemia, unspecified: Secondary | ICD-10-CM

## 2021-03-10 DIAGNOSIS — M1000 Idiopathic gout, unspecified site: Secondary | ICD-10-CM | POA: Diagnosis not present

## 2021-03-10 DIAGNOSIS — I10 Essential (primary) hypertension: Secondary | ICD-10-CM | POA: Diagnosis not present

## 2021-03-10 MED ORDER — PREDNISONE 10 MG PO TABS: ORAL_TABLET | ORAL | 2 refills | Status: DC

## 2021-03-10 MED ORDER — HYDROCODONE-ACETAMINOPHEN 7.5-325 MG PO TABS: 1.0000 | ORAL_TABLET | Freq: Four times a day (QID) | ORAL | 0 refills | Status: DC | PRN

## 2021-03-10 NOTE — Assessment & Plan Note (Signed)
On hand: Prednisone po prn - see Rx, Norco prn. Use prn.

## 2021-03-10 NOTE — Assessment & Plan Note (Signed)
Cont on Lovastatin 20 mg daily 

## 2021-03-10 NOTE — Assessment & Plan Note (Signed)
F/u w/Dr Jeffie Pollock - q 12 mo w/PSA Lupron stopped in 2023

## 2021-03-10 NOTE — Progress Notes (Signed)
Subjective:  Patient ID: Jeffrey Carter, male    DOB: Jun 09, 1940  Age: 81 y.o. MRN: [id number]  CC: No chief complaint on file.   HPI Jeffrey Carter presents for HTN, OA, gout, dyslipidemia  Outpatient Medications Prior to Visit  Medication Sig Dispense Refill   acetaminophen (TYLENOL) 325 MG tablet Take 2 tablets (650 mg total) by mouth every 8 (eight) hours. 30 tablet 0   amLODipine (NORVASC) 5 MG tablet Take 1 tablet by mouth once daily 90 tablet 3   cloNIDine (CATAPRES) 0.3 MG tablet Take 1 tablet by mouth twice daily 180 tablet 3   EQ ALLERGY RELIEF 10 MG tablet TAKE ONE TABLET BY MOUTH ONCE DAILY 90 tablet 3   fluticasone (FLONASE) 50 MCG/ACT nasal spray Place 2 sprays into both nostrils daily. (Patient taking differently: Place 2 sprays into both nostrils daily as needed for allergies.) 16 g 2   furosemide (LASIX) 40 MG tablet Take 1 tablet by mouth once daily 90 tablet 3   losartan (COZAAR) 100 MG tablet Take 1 tablet by mouth once daily 90 tablet 3   lovastatin (MEVACOR) 40 MG tablet TAKE 1 TABLET BY MOUTH ONCE DAILY 6 IN THE EVENING 90 tablet 3   Potassium Chloride ER 20 MEQ TBCR Take 1 tablet by mouth once daily 90 tablet 3   sildenafil (VIAGRA) 100 MG tablet Take 1 tablet (100 mg total) by mouth as needed for erectile dysfunction. 12 tablet 5   tamsulosin (FLOMAX) 0.4 MG CAPS capsule TAKE 1 CAPSULE BY MOUTH  DAILY (Patient taking differently: Take 0.4 mg by mouth 2 (two) times daily.) 90 capsule 1   HYDROcodone-acetaminophen (NORCO) 7.5-325 MG tablet Take 1 tablet by mouth every 6 (six) hours as needed for moderate pain. 20 tablet 0   predniSONE (DELTASONE) 10 MG tablet Take 30 mg on day 1, 20 mg on day 2 and 10 mg on day 3 prn gout attack. Tale pc 18 tablet 1   No facility-administered medications prior to visit.    ROS: Review of Systems  Constitutional:  Negative for appetite change, fatigue and unexpected weight change.  HENT:  Negative for congestion, nosebleeds,  sneezing, sore throat and trouble swallowing.   Eyes:  Negative for itching and visual disturbance.  Respiratory:  Negative for cough.   Cardiovascular:  Negative for chest pain, palpitations and leg swelling.  Gastrointestinal:  Negative for abdominal distention, blood in stool, diarrhea and nausea.  Genitourinary:  Negative for frequency and hematuria.  Musculoskeletal:  Positive for arthralgias. Negative for back pain, gait problem, joint swelling and neck pain.  Skin:  Negative for rash.  Neurological:  Negative for dizziness, tremors, speech difficulty and weakness.  Psychiatric/Behavioral:  Negative for agitation, dysphoric mood and sleep disturbance. The patient is not nervous/anxious.    Objective:  BP (!) 160/110 (BP Location: Right Arm, Patient Position: Sitting, Cuff Size: Large)    Pulse 74    Temp 98.7 F (37.1 C) (Oral)    Ht 5\' 9"  (1.753 m)    Wt 239 lb (108.4 kg)    SpO2 97%    BMI 35.29 kg/m   BP Readings from Last 3 Encounters:  03/10/21 (!) 160/110  02/09/21 (!) 142/86  12/08/20 132/78    Wt Readings from Last 3 Encounters:  03/10/21 239 lb (108.4 kg)  02/09/21 238 lb 9.6 oz (108.2 kg)  12/08/20 243 lb (110.2 kg)    Physical Exam Constitutional:      General: He is not  in acute distress.    Appearance: He is well-developed. He is obese.     Comments: NAD  Eyes:     Conjunctiva/sclera: Conjunctivae normal.     Pupils: Pupils are equal, round, and reactive to light.  Neck:     Thyroid: No thyromegaly.     Vascular: No JVD.  Cardiovascular:     Rate and Rhythm: Normal rate and regular rhythm.     Heart sounds: Normal heart sounds. No murmur heard.   No friction rub. No gallop.  Pulmonary:     Effort: Pulmonary effort is normal. No respiratory distress.     Breath sounds: Normal breath sounds. No wheezing or rales.  Chest:     Chest wall: No tenderness.  Abdominal:     General: Bowel sounds are normal. There is no distension.     Palpations: Abdomen is  soft. There is no mass.     Tenderness: There is no abdominal tenderness. There is no guarding or rebound.  Musculoskeletal:        General: No tenderness. Normal range of motion.     Cervical back: Normal range of motion.  Lymphadenopathy:     Cervical: No cervical adenopathy.  Skin:    General: Skin is warm and dry.     Findings: No rash.  Neurological:     Mental Status: He is alert and oriented to person, place, and time.     Cranial Nerves: No cranial nerve deficit.     Motor: No abnormal muscle tone.     Coordination: Coordination normal.     Gait: Gait normal.     Deep Tendon Reflexes: Reflexes are normal and symmetric.  Psychiatric:        Behavior: Behavior normal.        Thought Content: Thought content normal.        Judgment: Judgment normal.    Lab Results  Component Value Date   WBC 3.4 (L) 10/15/2019   HGB 13.2 10/15/2019   HCT 39.9 10/15/2019   PLT 161 10/15/2019   GLUCOSE 102 (H) 12/08/2020   CHOL 131 10/15/2019   TRIG 67 10/15/2019   HDL 43 10/15/2019   LDLCALC 74 10/15/2019   ALT 15 04/28/2020   AST 14 04/28/2020   NA 141 12/08/2020   K 3.5 12/08/2020   CL 105 12/08/2020   CREATININE 1.01 12/08/2020   BUN 11 12/08/2020   CO2 29 12/08/2020   TSH 2.26 10/15/2019   PSA 0.11 10/15/2019   INR 1.01 10/12/2014   HGBA1C 5.7 12/08/2020    DG Foot Complete Left  Result Date: 06/07/2019 CLINICAL DATA:  Left heel pain for 1 week common no known injury, initial encounter EXAM: LEFT FOOT - COMPLETE 3+ VIEW COMPARISON:  None. FINDINGS: No acute fracture or dislocation is noted. Calcaneal spurs are seen. No soft tissue abnormality is noted. Mild hallux valgus deformity is seen. IMPRESSION: Degenerative changes without acute abnormality. Electronically Signed   By: Inez Catalina M.D.   On: 06/07/2019 23:03    Assessment & Plan:   Problem List Items Addressed This Visit     Dyslipidemia    Cont on Lovastatin 20 mg daily      Essential hypertension     Continue to take Amlodipine, Catapress, Furosemide, Losartan      Relevant Orders   Uric acid   Comprehensive metabolic panel   Hemoglobin A1c   Gout attack    On hand: Prednisone po prn - see Rx, Norco  prn. Use prn.      Hyperglycemia - Primary   Relevant Orders   Uric acid   Hemoglobin A1c   Prostate cancer ([facility])    F/u w/Dr Jeffie Pollock - q 12 mo w/PSA Lupron stopped in 2023      Relevant Medications   predniSONE (DELTASONE) 10 MG tablet      Meds ordered this encounter  Medications   HYDROcodone-acetaminophen (NORCO) 7.5-325 MG tablet    Sig: Take 1 tablet by mouth every 6 (six) hours as needed for moderate pain.    Dispense:  60 tablet    Refill:  0   predniSONE (DELTASONE) 10 MG tablet    Sig: Take 30 mg on day 1, 20 mg on day 2 and 10 mg on day 3 prn gout attack. Tale pc    Dispense:  18 tablet    Refill:  2      Follow-up: Return in about 3 months (around 06/07/2021) for a follow-up visit.  [name], MD

## 2021-03-29 ENCOUNTER — Other Ambulatory Visit: Payer: Self-pay | Admitting: Internal Medicine

## 2021-05-10 ENCOUNTER — Ambulatory Visit: Payer: Medicare Other | Admitting: Internal Medicine

## 2021-05-12 DIAGNOSIS — G4733 Obstructive sleep apnea (adult) (pediatric): Secondary | ICD-10-CM | POA: Diagnosis not present

## 2021-06-04 ENCOUNTER — Other Ambulatory Visit: Payer: Self-pay | Admitting: Internal Medicine

## 2021-06-07 ENCOUNTER — Ambulatory Visit: Payer: Medicare Other | Admitting: Internal Medicine

## 2021-06-14 ENCOUNTER — Ambulatory Visit (INDEPENDENT_AMBULATORY_CARE_PROVIDER_SITE_OTHER): Payer: Medicare Other | Admitting: Internal Medicine

## 2021-06-14 ENCOUNTER — Encounter: Payer: Self-pay | Admitting: Internal Medicine

## 2021-06-14 DIAGNOSIS — I10 Essential (primary) hypertension: Secondary | ICD-10-CM | POA: Diagnosis not present

## 2021-06-14 DIAGNOSIS — R739 Hyperglycemia, unspecified: Secondary | ICD-10-CM

## 2021-06-14 DIAGNOSIS — M109 Gout, unspecified: Secondary | ICD-10-CM | POA: Diagnosis not present

## 2021-06-14 DIAGNOSIS — J309 Allergic rhinitis, unspecified: Secondary | ICD-10-CM | POA: Diagnosis not present

## 2021-06-14 DIAGNOSIS — E785 Hyperlipidemia, unspecified: Secondary | ICD-10-CM | POA: Diagnosis not present

## 2021-06-14 LAB — COMPREHENSIVE METABOLIC PANEL
ALT: 14 U/L (ref 0–53)
AST: 16 U/L (ref 0–37)
Albumin: 4 g/dL (ref 3.5–5.2)
Alkaline Phosphatase: 76 U/L (ref 39–117)
BUN: 17 mg/dL (ref 6–23)
CO2: 31 mEq/L (ref 19–32)
Calcium: 9.1 mg/dL (ref 8.4–10.5)
Chloride: 104 mEq/L (ref 96–112)
Creatinine, Ser: 1.16 mg/dL (ref 0.40–1.50)
GFR: 60.08 mL/min (ref >60.00)
Glucose, Bld: 99 mg/dL (ref 70–99)
Potassium: 3.2 mEq/L — ABNORMAL LOW (ref 3.5–5.1)
Sodium: 141 mEq/L (ref 135–145)
Total Bilirubin: 0.4 mg/dL (ref 0.2–1.2)
Total Protein: 6.7 g/dL (ref 6.0–8.3)

## 2021-06-14 LAB — HEMOGLOBIN A1C: Hgb A1c MFr Bld: 6.2 % (ref 4.6–6.5)

## 2021-06-14 LAB — URIC ACID: Uric Acid, Serum: 7 mg/dL (ref 4.0–7.8)

## 2021-06-14 MED ORDER — PREDNISONE 10 MG PO TABS: ORAL_TABLET | ORAL | 2 refills | Status: DC

## 2021-06-14 NOTE — Assessment & Plan Note (Signed)
Recurrent attacks Cont w/Prednisone busts prn Norco prn  Potential benefits of a short/long term opioids use as well as potential risks (i.e. addiction risk, apnea etc) and complications (i.e. Somnolence, constipation and others) were explained to the patient and were aknowledged.

## 2021-06-14 NOTE — Assessment & Plan Note (Signed)
Chronic Cont on Flonase

## 2021-06-14 NOTE — Progress Notes (Signed)
Subjective:  Patient ID: Jeffrey Carter, male    DOB: 11-23-1940  Age: 81 y.o. MRN: [id number]  CC: No chief complaint on file.   HPI Jeffrey Carter presents for HTN, allergies, dyslipidemia and gout f/u  Outpatient Medications Prior to Visit  Medication Sig Dispense Refill   acetaminophen (TYLENOL) 325 MG tablet Take 2 tablets (650 mg total) by mouth every 8 (eight) hours. 30 tablet 0   amLODipine (NORVASC) 5 MG tablet Take 1 tablet by mouth once daily 90 tablet 3   cloNIDine (CATAPRES) 0.3 MG tablet Take 1 tablet by mouth twice daily 180 tablet 3   EQ ALLERGY RELIEF 10 MG tablet TAKE ONE TABLET BY MOUTH ONCE DAILY 90 tablet 3   fluticasone (FLONASE) 50 MCG/ACT nasal spray Place 2 sprays into both nostrils daily. (Patient taking differently: Place 2 sprays into both nostrils daily as needed for allergies.) 16 g 2   furosemide (LASIX) 40 MG tablet Take 1 tablet by mouth once daily 90 tablet 3   HYDROcodone-acetaminophen (NORCO) 7.5-325 MG tablet Take 1 tablet by mouth every 6 (six) hours as needed for moderate pain. 60 tablet 0   losartan (COZAAR) 100 MG tablet Take 1 tablet (100 mg total) by mouth daily. Follow-up is due must see MD for future refills 30 tablet 0   lovastatin (MEVACOR) 40 MG tablet TAKE 1 TABLET BY MOUTH ONCE DAILY AT 6 PM IN THE EVENING 90 tablet 3   Potassium Chloride ER 20 MEQ TBCR Take 1 tablet by mouth once daily 90 tablet 3   sildenafil (VIAGRA) 100 MG tablet Take 1 tablet (100 mg total) by mouth as needed for erectile dysfunction. 12 tablet 5   tamsulosin (FLOMAX) 0.4 MG CAPS capsule TAKE 1 CAPSULE BY MOUTH  DAILY (Patient taking differently: Take 0.4 mg by mouth 2 (two) times daily.) 90 capsule 1   predniSONE (DELTASONE) 10 MG tablet Take 30 mg on day 1, 20 mg on day 2 and 10 mg on day 3 prn gout attack. Tale pc 18 tablet 2   No facility-administered medications prior to visit.    ROS: Review of Systems  Constitutional:  Positive for unexpected weight  change. Negative for appetite change and fatigue.  HENT:  Negative for congestion, nosebleeds, sneezing, sore throat and trouble swallowing.   Eyes:  Negative for itching and visual disturbance.  Respiratory:  Negative for cough.   Cardiovascular:  Negative for chest pain, palpitations and leg swelling.  Gastrointestinal:  Negative for abdominal distention, blood in stool, diarrhea and nausea.  Genitourinary:  Negative for frequency and hematuria.  Musculoskeletal:  Positive for arthralgias. Negative for back pain, gait problem, joint swelling and neck pain.  Skin:  Negative for rash.  Neurological:  Negative for dizziness, tremors, speech difficulty and weakness.  Psychiatric/Behavioral:  Negative for agitation, dysphoric mood and sleep disturbance. The patient is not nervous/anxious.    Objective:  BP 140/80 (BP Location: Left Arm, Patient Position: Sitting, Cuff Size: Normal)   Pulse 76   Temp 97.9 F (36.6 C) (Oral)   Ht '5\' 9"'$  (1.753 m)   Wt 255 lb (115.7 kg)   SpO2 96%   BMI 37.66 kg/m   BP Readings from Last 3 Encounters:  06/14/21 140/80  03/10/21 (!) 160/110  02/09/21 (!) 142/86    Wt Readings from Last 3 Encounters:  06/14/21 255 lb (115.7 kg)  03/10/21 239 lb (108.4 kg)  02/09/21 238 lb 9.6 oz (108.2 kg)    Physical Exam  Constitutional:      General: He is not in acute distress.    Appearance: He is well-developed. He is obese.     Comments: NAD  Eyes:     Conjunctiva/sclera: Conjunctivae normal.     Pupils: Pupils are equal, round, and reactive to light.  Neck:     Thyroid: No thyromegaly.     Vascular: No JVD.  Cardiovascular:     Rate and Rhythm: Normal rate and regular rhythm.     Heart sounds: Normal heart sounds. No murmur heard.   No friction rub. No gallop.  Pulmonary:     Effort: Pulmonary effort is normal. No respiratory distress.     Breath sounds: Normal breath sounds. No wheezing or rales.  Chest:     Chest wall: No tenderness.  Abdominal:      General: Bowel sounds are normal. There is no distension.     Palpations: Abdomen is soft. There is no mass.     Tenderness: There is no abdominal tenderness. There is no guarding or rebound.  Musculoskeletal:        General: No tenderness. Normal range of motion.     Cervical back: Normal range of motion.  Lymphadenopathy:     Cervical: No cervical adenopathy.  Skin:    General: Skin is warm and dry.     Findings: No rash.  Neurological:     Mental Status: He is alert and oriented to person, place, and time.     Cranial Nerves: No cranial nerve deficit.     Motor: No abnormal muscle tone.     Coordination: Coordination normal.     Gait: Gait normal.     Deep Tendon Reflexes: Reflexes are normal and symmetric.  Psychiatric:        Behavior: Behavior normal.        Thought Content: Thought content normal.        Judgment: Judgment normal.    Lab Results  Component Value Date   WBC 3.4 (L) 10/15/2019   HGB 13.2 10/15/2019   HCT 39.9 10/15/2019   PLT 161 10/15/2019   GLUCOSE 102 (H) 12/08/2020   CHOL 131 10/15/2019   TRIG 67 10/15/2019   HDL 43 10/15/2019   LDLCALC 74 10/15/2019   ALT 15 04/28/2020   AST 14 04/28/2020   NA 141 12/08/2020   K 3.5 12/08/2020   CL 105 12/08/2020   CREATININE 1.01 12/08/2020   BUN 11 12/08/2020   CO2 29 12/08/2020   TSH 2.26 10/15/2019   PSA 0.11 10/15/2019   INR 1.01 10/12/2014   HGBA1C 5.7 12/08/2020    DG Foot Complete Left  Result Date: 06/07/2019 CLINICAL DATA:  Left heel pain for 1 week common no known injury, initial encounter EXAM: LEFT FOOT - COMPLETE 3+ VIEW COMPARISON:  None. FINDINGS: No acute fracture or dislocation is noted. Calcaneal spurs are seen. No soft tissue abnormality is noted. Mild hallux valgus deformity is seen. IMPRESSION: Degenerative changes without acute abnormality. Electronically Signed   By: Inez Catalina M.D.   On: 06/07/2019 23:03    Assessment & Plan:   Problem List Items Addressed This Visit      Dyslipidemia    Cont on Lovastatin 20 mg daily      Essential hypertension    Continue to take Amlodipine, Catapress, Furosemide, Losartan      Gout of big toe    Recurrent attacks Cont w/Prednisone busts prn Norco prn  Potential benefits of a short/long  term opioids use as well as potential risks (i.e. addiction risk, apnea etc) and complications (i.e. Somnolence, constipation and others) were explained to the patient and were aknowledged.        Relevant Medications   predniSONE (DELTASONE) 10 MG tablet   Allergic rhinitis    Chronic Cont on Flonase          Meds ordered this encounter  Medications   predniSONE (DELTASONE) 10 MG tablet    Sig: Take 30 mg on day 1, 20 mg on day 2 and 10 mg on day 3 prn gout attack. Tale pc    Dispense:  18 tablet    Refill:  2      Follow-up: Return in about 3 months (around 09/14/2021) for a follow-up visit.  [name], MD

## 2021-06-14 NOTE — Assessment & Plan Note (Signed)
Cont on Lovastatin 20 mg daily 

## 2021-07-29 ENCOUNTER — Other Ambulatory Visit: Payer: Self-pay | Admitting: Internal Medicine

## 2021-08-06 DIAGNOSIS — G4733 Obstructive sleep apnea (adult) (pediatric): Secondary | ICD-10-CM | POA: Diagnosis not present

## 2021-08-07 ENCOUNTER — Other Ambulatory Visit: Payer: Self-pay | Admitting: Internal Medicine

## 2021-08-11 ENCOUNTER — Encounter: Payer: Self-pay | Admitting: Internal Medicine

## 2021-09-09 ENCOUNTER — Ambulatory Visit: Payer: Medicare Other

## 2021-09-14 ENCOUNTER — Telehealth: Payer: Self-pay

## 2021-09-14 ENCOUNTER — Ambulatory Visit (INDEPENDENT_AMBULATORY_CARE_PROVIDER_SITE_OTHER): Payer: Medicare Other | Admitting: Internal Medicine

## 2021-09-14 ENCOUNTER — Encounter: Payer: Self-pay | Admitting: Internal Medicine

## 2021-09-14 DIAGNOSIS — E785 Hyperlipidemia, unspecified: Secondary | ICD-10-CM | POA: Diagnosis not present

## 2021-09-14 DIAGNOSIS — E669 Obesity, unspecified: Secondary | ICD-10-CM | POA: Insufficient documentation

## 2021-09-14 DIAGNOSIS — M109 Gout, unspecified: Secondary | ICD-10-CM

## 2021-09-14 DIAGNOSIS — I10 Essential (primary) hypertension: Secondary | ICD-10-CM

## 2021-09-14 DIAGNOSIS — Z6837 Body mass index (BMI) 37.0-37.9, adult: Secondary | ICD-10-CM

## 2021-09-14 LAB — COMPREHENSIVE METABOLIC PANEL
ALT: 13 U/L (ref 0–53)
AST: 14 U/L (ref 0–37)
Albumin: 4 g/dL (ref 3.5–5.2)
Alkaline Phosphatase: 75 U/L (ref 39–117)
BUN: 18 mg/dL (ref 6–23)
CO2: 31 mEq/L (ref 19–32)
Calcium: 9 mg/dL (ref 8.4–10.5)
Chloride: 104 mEq/L (ref 96–112)
Creatinine, Ser: 1.18 mg/dL (ref 0.40–1.50)
GFR: 58.76 mL/min — ABNORMAL LOW (ref >60.00)
Glucose, Bld: 99 mg/dL (ref 70–99)
Potassium: 3.5 mEq/L (ref 3.5–5.1)
Sodium: 142 mEq/L (ref 135–145)
Total Bilirubin: 0.5 mg/dL (ref 0.2–1.2)
Total Protein: 6.8 g/dL (ref 6.0–8.3)

## 2021-09-14 MED ORDER — AMLODIPINE BESYLATE 10 MG PO TABS: 10.0000 mg | ORAL_TABLET | Freq: Every day | ORAL | 3 refills | Status: DC

## 2021-09-14 NOTE — Telephone Encounter (Signed)
Unsuccessful attempt to reach patient on preferred number listed in notes for scheduled AWV. Unable to leave a message. Voicemail full.

## 2021-09-14 NOTE — Progress Notes (Signed)
This encounter was created in error - please disregard.

## 2021-09-14 NOTE — Progress Notes (Signed)
Subjective:  Patient ID: Jeffrey Carter, male    DOB: Jun 06, 1940  Age: 81 y.o. MRN: [id number]  CC: Follow-up (3 month f/u)   HPI Jeffrey Carter presents for gout, HTN, dyslipidemia  Outpatient Medications Prior to Visit  Medication Sig Dispense Refill   acetaminophen (TYLENOL) 325 MG tablet Take 2 tablets (650 mg total) by mouth every 8 (eight) hours. 30 tablet 0   cloNIDine (CATAPRES) 0.3 MG tablet Take 1 tablet (0.3 mg total) by mouth 2 (two) times daily. Follow-up appt due in August must see provider for future refills 180 tablet 0   EQ ALLERGY RELIEF 10 MG tablet TAKE ONE TABLET BY MOUTH ONCE DAILY 90 tablet 3   finasteride (PROSCAR) 5 MG tablet Take 5 mg by mouth daily.     fluticasone (FLONASE) 50 MCG/ACT nasal spray Place 2 sprays into both nostrils daily. (Patient taking differently: Place 2 sprays into both nostrils daily as needed for allergies.) 16 g 2   furosemide (LASIX) 40 MG tablet Take 1 tablet by mouth once daily 90 tablet 3   HYDROcodone-acetaminophen (NORCO) 7.5-325 MG tablet Take 1 tablet by mouth every 6 (six) hours as needed for moderate pain. 60 tablet 0   losartan (COZAAR) 100 MG tablet Take 1 tablet (100 mg total) by mouth daily. Annual appt due w/labs in Sept must see provider for future refills 90 tablet 0   lovastatin (MEVACOR) 40 MG tablet TAKE 1 TABLET BY MOUTH ONCE DAILY AT 6 PM IN THE EVENING 90 tablet 3   Potassium Chloride ER 20 MEQ TBCR Take 1 tablet by mouth once daily 90 tablet 3   sildenafil (VIAGRA) 100 MG tablet Take 1 tablet (100 mg total) by mouth as needed for erectile dysfunction. 12 tablet 5   tamsulosin (FLOMAX) 0.4 MG CAPS capsule TAKE 1 CAPSULE BY MOUTH  DAILY (Patient taking differently: Take 0.4 mg by mouth 2 (two) times daily.) 90 capsule 1   amLODipine (NORVASC) 5 MG tablet Take 1 tablet by mouth once daily 90 tablet 3   predniSONE (DELTASONE) 10 MG tablet Take 30 mg on day 1, 20 mg on day 2 and 10 mg on day 3 prn gout attack. Tale pc  (Patient not taking: Reported on 09/14/2021) 18 tablet 2   No facility-administered medications prior to visit.    ROS: Review of Systems  Constitutional:  Negative for appetite change, fatigue and unexpected weight change.  HENT:  Negative for congestion, nosebleeds, sneezing, sore throat and trouble swallowing.   Eyes:  Negative for itching and visual disturbance.  Respiratory:  Negative for cough.   Cardiovascular:  Negative for chest pain, palpitations and leg swelling.  Gastrointestinal:  Negative for abdominal distention, blood in stool, diarrhea and nausea.  Genitourinary:  Negative for frequency and hematuria.  Musculoskeletal:  Positive for arthralgias. Negative for back pain, gait problem, joint swelling and neck pain.  Skin:  Negative for rash.  Neurological:  Negative for dizziness, tremors, speech difficulty and weakness.  Psychiatric/Behavioral:  Negative for agitation, dysphoric mood and sleep disturbance. The patient is not nervous/anxious.     Objective:  BP (!) 140/90   Pulse 77   Temp 97.7 F (36.5 C) (Oral)   Ht '5\' 9"'$  (1.753 m)   Wt 257 lb 3.2 oz (116.7 kg)   SpO2 97%   BMI 37.98 kg/m   BP Readings from Last 3 Encounters:  09/14/21 (!) 140/90  06/14/21 140/80  03/10/21 (!) 160/110    Wt Readings from  Last 3 Encounters:  09/14/21 257 lb 3.2 oz (116.7 kg)  06/14/21 255 lb (115.7 kg)  03/10/21 239 lb (108.4 kg)    Physical Exam Constitutional:      General: He is not in acute distress.    Appearance: He is well-developed. He is obese.     Comments: NAD  Eyes:     Conjunctiva/sclera: Conjunctivae normal.     Pupils: Pupils are equal, round, and reactive to light.  Neck:     Thyroid: No thyromegaly.     Vascular: No JVD.  Cardiovascular:     Rate and Rhythm: Normal rate and regular rhythm.     Heart sounds: Normal heart sounds. No murmur heard.    No friction rub. No gallop.  Pulmonary:     Effort: Pulmonary effort is normal. No respiratory  distress.     Breath sounds: Normal breath sounds. No wheezing or rales.  Chest:     Chest wall: No tenderness.  Abdominal:     General: Bowel sounds are normal. There is no distension.     Palpations: Abdomen is soft. There is no mass.     Tenderness: There is no abdominal tenderness. There is no guarding or rebound.  Musculoskeletal:        General: No tenderness. Normal range of motion.     Cervical back: Normal range of motion.  Lymphadenopathy:     Cervical: No cervical adenopathy.  Skin:    General: Skin is warm and dry.     Findings: No rash.  Neurological:     Mental Status: He is alert and oriented to person, place, and time.     Cranial Nerves: No cranial nerve deficit.     Motor: No abnormal muscle tone.     Coordination: Coordination normal.     Gait: Gait normal.     Deep Tendon Reflexes: Reflexes are normal and symmetric.  Psychiatric:        Behavior: Behavior normal.        Thought Content: Thought content normal.        Judgment: Judgment normal.     Lab Results  Component Value Date   WBC 3.4 (L) 10/15/2019   HGB 13.2 10/15/2019   HCT 39.9 10/15/2019   PLT 161 10/15/2019   GLUCOSE 99 06/14/2021   CHOL 131 10/15/2019   TRIG 67 10/15/2019   HDL 43 10/15/2019   LDLCALC 74 10/15/2019   ALT 14 06/14/2021   AST 16 06/14/2021   NA 141 06/14/2021   K 3.2 (L) 06/14/2021   CL 104 06/14/2021   CREATININE 1.16 06/14/2021   BUN 17 06/14/2021   CO2 31 06/14/2021   TSH 2.26 10/15/2019   PSA 0.11 10/15/2019   INR 1.01 10/12/2014   HGBA1C 6.2 06/14/2021    DG Foot Complete Left  Result Date: 06/07/2019 CLINICAL DATA:  Left heel pain for 1 week common no known injury, initial encounter EXAM: LEFT FOOT - COMPLETE 3+ VIEW COMPARISON:  None. FINDINGS: No acute fracture or dislocation is noted. Calcaneal spurs are seen. No soft tissue abnormality is noted. Mild hallux valgus deformity is seen. IMPRESSION: Degenerative changes without acute abnormality.  Electronically Signed   By: Inez Catalina M.D.   On: 06/07/2019 23:03    Assessment & Plan:   Problem List Items Addressed This Visit     Dyslipidemia    Cont on Lovastatin 20 mg daily      Essential hypertension    Sub-optimal control Continue  to take Amlodipine - increase to 10 mg/d, Catapress, Furosemide, Losartan      Relevant Medications   amLODipine (NORVASC) 10 MG tablet   Other Relevant Orders   Comprehensive metabolic panel   Gout of big toe    Recurrent  Recurrent attacks Cont w/Prednisone busts prn Norco prn  Potential benefits of a short/long term opioids use as well as potential risks (i.e. addiction risk, apnea etc) and complications (i.e. Somnolence, constipation and others) were explained to the patient and were aknowledged.      Obesity    Diet discussed         Meds ordered this encounter  Medications   amLODipine (NORVASC) 10 MG tablet    Sig: Take 1 tablet (10 mg total) by mouth daily.    Dispense:  90 tablet    Refill:  3      Follow-up: Return in about 3 months (around 12/15/2021) for a follow-up visit.  [name], MD

## 2021-09-14 NOTE — Progress Notes (Deleted)
Subjective:   Jeffrey Carter is a 81 y.o. male who presents for Medicare Annual/Subsequent preventive examination.  Review of Systems    ***       Objective:    There were no vitals filed for this visit. There is no height or weight on file to calculate BMI.     09/08/2020    4:23 PM 06/28/2018    2:20 PM 04/25/2017    3:51 PM 10/12/2016   12:26 AM 08/31/2016   11:45 AM 07/31/2016   12:33 PM 06/14/2016   12:48 PM  Advanced Directives  Does Patient Have a Medical Advance Directive? Yes No;Yes No No No No No  Type of [employer] treasurer of [place];Living will       Does patient want to make changes to medical advance directive? No - Patient declined        Copy of [place] in Chart?  No - copy requested       Would patient like information on creating a medical advance directive?   Yes (ED - Information included in AVS) No - Patient declined No - Patient declined      Current Medications (verified) Outpatient Encounter Medications as of 09/14/2021  Medication Sig   acetaminophen (TYLENOL) 325 MG tablet Take 2 tablets (650 mg total) by mouth every 8 (eight) hours.   amLODipine (NORVASC) 5 MG tablet Take 1 tablet by mouth once daily   cloNIDine (CATAPRES) 0.3 MG tablet Take 1 tablet (0.3 mg total) by mouth 2 (two) times daily. Follow-up appt due in August must see provider for future refills   EQ ALLERGY RELIEF 10 MG tablet TAKE ONE TABLET BY MOUTH ONCE DAILY   fluticasone (FLONASE) 50 MCG/ACT nasal spray Place 2 sprays into both nostrils daily. (Patient taking differently: Place 2 sprays into both nostrils daily as needed for allergies.)   furosemide (LASIX) 40 MG tablet Take 1 tablet by mouth once daily   HYDROcodone-acetaminophen (NORCO) 7.5-325 MG tablet Take 1 tablet by mouth every 6 (six) hours as needed for moderate pain.   losartan (COZAAR) 100 MG tablet Take 1 tablet (100 mg total) by mouth daily. Annual appt due w/labs in Sept must see  provider for future refills   lovastatin (MEVACOR) 40 MG tablet TAKE 1 TABLET BY MOUTH ONCE DAILY AT 6 PM IN THE EVENING   Potassium Chloride ER 20 MEQ TBCR Take 1 tablet by mouth once daily   predniSONE (DELTASONE) 10 MG tablet Take 30 mg on day 1, 20 mg on day 2 and 10 mg on day 3 prn gout attack. Tale pc   sildenafil (VIAGRA) 100 MG tablet Take 1 tablet (100 mg total) by mouth as needed for erectile dysfunction.   tamsulosin (FLOMAX) 0.4 MG CAPS capsule TAKE 1 CAPSULE BY MOUTH  DAILY (Patient taking differently: Take 0.4 mg by mouth 2 (two) times daily.)   No facility-administered encounter medications on file as of 09/14/2021.    Allergies (verified) Percocet [oxycodone-acetaminophen]   History: Past Medical History:  Diagnosis Date   Bladder outlet obstruction    BPH (benign prostatic hyperplasia)    Conjunctivitis, acute, bilateral    07-02-2015  per pcp note and started antibiotic drops   First degree heart block    History of acute conjunctivitis    07-02-2015  resolved after round of antibiotic eye drops   History of gout    BIG TOE   History of urinary retention 12/2014  Hx of adenomatous colonic polyps 07/07/2016   Hyperlipidemia    Hypertension    Lower urinary tract symptoms (LUTS)    OSA on CPAP    MODERATE PER STUDY 03-08-2012   Osteoarthritis    PAC (premature atrial contraction)    Prostate cancer [facility]) urologist-  dr budzyn/  oncologist-  dr Ander Slade   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),  Gleason 3+4,  PSA 10.57,  vol 140m //   External beam radiation therapy  08-27-2014 to 10-22-2014   S/P radiation therapy 08-27-2014  to  10-22-2014   prostate 7800Gy in 40 sessions and seminal vesicals 5000Gy in 40 sessions   Wears glasses    Past Surgical History:  Procedure Laterality Date   APPENDECTOMY  1966   COLONOSCOPY  07-06-2006   GOLD SEED IMPLANT N/A 08/12/2014   Procedure: GOLD SEED IMPLANT;  Surgeon: MLowella Bandy MD;  [facility]  Service:  Urology;  Laterality: N/A;   GREEN LIGHT LASER TURP (TRANSURETHRAL RESECTION OF PROSTATE N/A 07/20/2015   Procedure: GREEN LIGHT LASER ABLATION OF PROSTATE ;  Surgeon: BNickie Retort MD;  [facility]  Service: Urology;  Laterality: N/A;   PROSTATE BIOPSY N/A 03/28/2014   Procedure: BIOPSY TRANSRECTAL ULTRASONIC PROSTATE (TUBP);  Surgeon: MArvil Persons MD;  [facility]  Service: Urology;  Laterality: N/A;   TONSILLECTOMY  as child   TOTAL KNEE ARTHROPLASTY Right 06-22-2007   Family History  Problem Relation Age of Onset   Diabetes Father    Cancer Brother        pancreatic, stomach   Colon cancer Neg Hx    Social History   Socioeconomic History   Marital status: Married    Spouse name: Not on file   Number of children: 4   Years of education: Not on file   Highest education level: Not on file  Occupational History   Occupation: retired  Tobacco Use   Smoking status: Former    Packs/day: 0.25    Years: 8.00    Total pack years: 2.00    Types: Cigarettes    Quit date: 01/24/1962    Years since quitting: 59.6   Smokeless tobacco: Never  Vaping Use   Vaping Use: Never used  Substance and Sexual Activity   Alcohol use: No   Drug use: No   Sexual activity: Yes    Partners: Female  Other Topics Concern   Not on file  Social History Narrative   Married 8 years- divorced- married '85   2 sons- '72, '74; 1 daughter '75   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  Social Determinants of HRadio broadcast assistantStrain: LOzark (09/08/2020)   Overall Financial Resource Strain (CARDIA)    Difficulty of Paying Living Expenses: Not hard at [place] No [place](09/08/2020)   Hunger Vital Sign    Worried About RS[place] the Last Year: Never true    R[place]aum[place] the Last Year: Never true  Transportation Needs: No Transportation Needs (09/08/2020)   PRAPARE -  THydrologist(Medical): No    Lack of Transportation (Non-Medical): No  Physical Activity: Sufficiently Active (09/08/2020)   Exercise Vital Sign    Days of Exercise per Week: 5 days    Minutes of Exercise per Session: 30 min  Stress: No Stress Concern Present (09/08/2020)  [place]    Feeling of Stress : Not at all  Social Connections: Socially Integrated (09/08/2020)   Social Connection and Isolation Panel [NHANES]    Frequency of Communication with Friends and Family: More than three times a week    Frequency of Social Gatherings with Friends and Family: Not on file    Attends Religious Services: More than 4 times per year    Active Member of [employer] or Organizations: Yes    Attends Music therapist: More than 4 times per year    Marital Status: Married    Tobacco Counseling Counseling given: Not Answered   Clinical Intake:                 Diabetic?***         Activities of Daily Living     No data to display          Patient Care Team: Plotnikov, Evie Lacks, MD as PCP - General (Internal Medicine) Earlie Server, MD (Orthopedic Surgery) Nickie Retort, MD (Inactive) as Consulting Physician (Urology) Chesley Mires, MD as Consulting Physician (Pulmonary Disease) Regal, Tamala Fothergill, DPM as Consulting Physician (Podiatry) Keene Breath., MD as Consulting Physician (Ophthalmology)  Indicate any recent Medical Services you may have received from other than [facility] providers in the past year (date may be approximate).     Assessment:   This is a routine wellness examination for [place].  Hearing/Vision screen No results found.  Dietary issues and exercise activities discussed:     Goals Addressed   None    Depression Screen    09/08/2020    4:07 PM 07/28/2020    2:46 PM 06/28/2018    2:20 PM 04/25/2017    3:51 PM 04/25/2017    2:02 PM 03/21/2016     1:29 PM 01/11/2016    9:47 AM  PHQ 2/9 Scores  PHQ - 2 Score 0 0 0 0 0 1 0  PHQ- 9 Score  0  1       Fall Risk    09/08/2020    4:25 PM 04/28/2020    2:19 PM 06/28/2018    2:20 PM 04/25/2017    3:51 PM 04/25/2017    2:02 PM  Gypsy in the past year? 0 0 0 No No  Number falls in past yr: 0 0 0    Injury with Fall? 0 0     Risk for fall due to : No Fall Risks No Fall Risks     Follow up Falls evaluation completed Falls evaluation completed       FALL RISK PREVENTION PERTAINING TO THE HOME:  Any stairs in or around the home? {YES/NO:21197} If so, are there any without handrails? {YES/NO:21197} Home free of loose throw rugs in walkways, pet beds, electrical cords, etc? {YES/NO:21197} Adequate lighting in your home to reduce risk of falls? {YES/NO:21197}  ASSISTIVE DEVICES UTILIZED TO PREVENT FALLS:  Life alert? {YES/NO:21197} Use of a cane, walker or w/c? {YES/NO:21197} Grab bars in the bathroom? {YES/NO:21197} Shower chair or bench in shower? {YES/NO:21197} Elevated toilet seat or a handicapped toilet? {YES/NO:21197}  TIMED UP AND GO:  Was the test performed? {YES/NO:21197}.  Length of time to ambulate 10 feet: *** sec.   {Appearance of HERD:4081448}  Cognitive Function:    04/25/2017    3:52 PM 03/21/2016    1:30 PM  MMSE - Mini Mental State Exam  Orientation to  time 5 5  Orientation to Place 5 5  Registration 3 3  Attention/ Calculation 5 5  Recall 1 1  Language- name 2 objects 2 2  Language- repeat 1 1  Language- follow 3 step command 3 3  Language- read & follow direction 1 1  Write a sentence 1 1  Copy design 1 1  Total score 28 28        Immunizations Immunization History  Administered Date(s) Administered   Fluad Quad(high Dose 65+) 10/03/2018, 11/07/2019, 10/05/2020   Influenza Split 11/25/2010, 11/03/2011   Influenza Whole 10/24/2007, 10/30/2008, 09/24/2009   Influenza, High Dose Seasonal PF 09/22/2015, 10/18/2016, 10/17/2017    Influenza,inj,Quad PF,6+ Mos 10/17/2012, 10/11/2013, 10/09/2014   PFIZER(Purple Top)SARS-COV-2 Vaccination 03/21/2019, 04/16/2019   PPD Test 10/29/2013, 10/29/2013   Pneumococcal Conjugate-13 02/20/2013   Pneumococcal Polysaccharide-23 10/30/2008, 10/12/2015   Td 10/30/2008   Tdap 11/25/2010   Zoster, Live 03/13/2013    {TDAP status:2101805}  {Flu Vaccine status:2101806}  {Pneumococcal vaccine status:2101807}  {Covid-19 vaccine status:2101808}  Qualifies for Shingles Vaccine? {YES/NO:21197}  Zostavax completed {YES/NO:21197}  {Shingrix Completed?:2101804}  Screening Tests Health Maintenance  Topic Date Due   Hepatitis C Screening  Never done   Zoster Vaccines- Shingrix (1 of 2) Never done   COVID-19 Vaccine (3 - Pfizer risk series) 05/14/2019   TETANUS/TDAP  11/24/2020   INFLUENZA VACCINE  08/24/2021   Pneumonia Vaccine 40+ Years old  Completed   HPV VACCINES  Aged Out   COLONOSCOPY (Pts 45-34yr Insurance coverage will need to be confirmed)  Discontinued    Health Maintenance  Health Maintenance Due  Topic Date Due   Hepatitis C Screening  Never done   Zoster Vaccines- Shingrix (1 of 2) Never done   COVID-19 Vaccine (3 - Pfizer risk series) 05/14/2019   TETANUS/TDAP  11/24/2020   INFLUENZA VACCINE  08/24/2021    {Colorectal cancer screening:2101809}  Lung Cancer Screening: (Low Dose CT Chest recommended if Age 79-80years, 30 pack-year currently smoking OR have quit w/in 15years.) {DOES NOT does:27190::"does not"} qualify.   Lung Cancer Screening Referral: ***  Additional Screening:  Hepatitis C Screening: {DOES NOT does:27190::"does not"} qualify; Completed ***  Vision Screening: Recommended annual ophthalmology exams for early detection of glaucoma and other disorders of the eye. Is the patient up to date with their annual eye exam?  {YES/NO:21197} Who is the provider or what is the name of the office in which the patient attends annual eye exams? *** If  pt is not established with a provider, would they like to be referred to a provider to establish care? {YES/NO:21197}.   Dental Screening: Recommended annual dental exams for proper oral hygiene  Community Resource Referral / Chronic Care Management: CRR required this visit?  {YES/NO:21197}  CCM required this visit?  {YES/NO:21197}     Plan:     I have personally reviewed and noted the following in the patient's chart:   Medical and social history Use of alcohol, tobacco or illicit drugs  Current medications and supplements including opioid prescriptions. {Opioid Prescriptions:[phone]} Functional ability and status Nutritional status Physical activity Advanced directives List of other physicians Hospitalizations, surgeries, and ER visits in previous 12 months Vitals Screenings to include cognitive, depression, and falls Referrals and appointments  In addition, I have reviewed and discussed with patient certain preventive protocols, quality metrics, and best practice recommendations. A written personalized care plan for preventive services as well as general preventive health recommendations were provided to patient.     BRise Paganini  Fidel Levy, LPN   4/31/5400   Nurse Notes: ***

## 2021-09-14 NOTE — Assessment & Plan Note (Signed)
Diet discussed 

## 2021-09-14 NOTE — Patient Instructions (Signed)
Increase Amlodipine to 10 mg a day

## 2021-09-14 NOTE — Assessment & Plan Note (Signed)
Recurrent  Recurrent attacks Cont w/Prednisone busts prn Norco prn  Potential benefits of a short/long term opioids use as well as potential risks (i.e. addiction risk, apnea etc) and complications (i.e. Somnolence, constipation and others) were explained to the patient and were aknowledged.

## 2021-09-14 NOTE — Assessment & Plan Note (Signed)
Cont on Lovastatin 20 mg daily 

## 2021-09-14 NOTE — Assessment & Plan Note (Addendum)
Sub-optimal control Continue to take Amlodipine - increase to 10 mg/d, Catapress, Furosemide, Losartan

## 2021-10-07 ENCOUNTER — Ambulatory Visit (INDEPENDENT_AMBULATORY_CARE_PROVIDER_SITE_OTHER): Payer: Medicare Other | Admitting: *Deleted

## 2021-10-07 DIAGNOSIS — Z23 Encounter for immunization: Secondary | ICD-10-CM

## 2021-10-18 ENCOUNTER — Telehealth: Payer: Self-pay | Admitting: Internal Medicine

## 2021-10-18 NOTE — Telephone Encounter (Signed)
Mail box was full when I called patient to schedule AWV.

## 2021-10-28 ENCOUNTER — Other Ambulatory Visit: Payer: Self-pay | Admitting: Internal Medicine

## 2021-10-29 ENCOUNTER — Other Ambulatory Visit: Payer: Self-pay | Admitting: Internal Medicine

## 2021-11-18 DIAGNOSIS — G4733 Obstructive sleep apnea (adult) (pediatric): Secondary | ICD-10-CM | POA: Diagnosis not present

## 2021-11-25 ENCOUNTER — Telehealth: Payer: Self-pay | Admitting: Internal Medicine

## 2021-11-25 ENCOUNTER — Other Ambulatory Visit: Payer: Self-pay | Admitting: Internal Medicine

## 2021-11-25 NOTE — Telephone Encounter (Signed)
N/A voicemail full unable to leave a message for patient to call back to schedule Medicare Annual Wellness Visit   Last AWV  09/08/20  Please schedule at anytime with [facility] if patient calls the office back.      Any questions, please call me at [phone]

## 2021-11-27 ENCOUNTER — Other Ambulatory Visit: Payer: Self-pay | Admitting: Internal Medicine

## 2021-12-01 NOTE — Telephone Encounter (Signed)
Patient came in stating he still hasn't heard anything from his pharmacy about his refill request for predniSONE (DELTASONE) 10 MG tablet. Patient stated that he wants medication refill sent to [facility], [address].

## 2021-12-01 NOTE — Telephone Encounter (Signed)
Pls refill.Marland KitchenJohny Carter

## 2021-12-02 DIAGNOSIS — I10 Essential (primary) hypertension: Secondary | ICD-10-CM | POA: Diagnosis not present

## 2021-12-02 DIAGNOSIS — H35033 Hypertensive retinopathy, bilateral: Secondary | ICD-10-CM | POA: Diagnosis not present

## 2021-12-02 DIAGNOSIS — H52223 Regular astigmatism, bilateral: Secondary | ICD-10-CM | POA: Diagnosis not present

## 2021-12-02 DIAGNOSIS — H5211 Myopia, right eye: Secondary | ICD-10-CM | POA: Diagnosis not present

## 2021-12-02 DIAGNOSIS — H26493 Other secondary cataract, bilateral: Secondary | ICD-10-CM | POA: Diagnosis not present

## 2021-12-02 DIAGNOSIS — Z961 Presence of intraocular lens: Secondary | ICD-10-CM | POA: Diagnosis not present

## 2021-12-02 DIAGNOSIS — H524 Presbyopia: Secondary | ICD-10-CM | POA: Diagnosis not present

## 2021-12-15 ENCOUNTER — Ambulatory Visit: Payer: Medicare Other | Admitting: Internal Medicine

## 2021-12-29 ENCOUNTER — Telehealth: Payer: Self-pay | Admitting: Internal Medicine

## 2021-12-29 ENCOUNTER — Other Ambulatory Visit: Payer: Self-pay | Admitting: Internal Medicine

## 2021-12-29 NOTE — Telephone Encounter (Signed)
N/A unable to leave a message for patient to call back to schedule Medicare Annual Wellness Visit   Last AWV  09/08/20  Please schedule at anytime with [facility] if patient calls the office back.      Any questions, please call me at [phone]

## 2022-01-06 ENCOUNTER — Telehealth: Payer: Self-pay | Admitting: Internal Medicine

## 2022-01-06 DIAGNOSIS — E785 Hyperlipidemia, unspecified: Secondary | ICD-10-CM | POA: Diagnosis not present

## 2022-01-06 DIAGNOSIS — I10 Essential (primary) hypertension: Secondary | ICD-10-CM | POA: Diagnosis not present

## 2022-01-06 DIAGNOSIS — Z79899 Other long term (current) drug therapy: Secondary | ICD-10-CM | POA: Diagnosis not present

## 2022-01-06 DIAGNOSIS — Z87891 Personal history of nicotine dependence: Secondary | ICD-10-CM | POA: Diagnosis not present

## 2022-01-06 DIAGNOSIS — R011 Cardiac murmur, unspecified: Secondary | ICD-10-CM | POA: Diagnosis not present

## 2022-01-06 NOTE — Telephone Encounter (Signed)
MAIL BOX WAS FULL, WAS NOT ABLE TO LVM

## 2022-01-31 ENCOUNTER — Ambulatory Visit (INDEPENDENT_AMBULATORY_CARE_PROVIDER_SITE_OTHER): Payer: Medicare Other | Admitting: Internal Medicine

## 2022-01-31 ENCOUNTER — Encounter: Payer: Self-pay | Admitting: Internal Medicine

## 2022-01-31 VITALS — BP 130/78 | HR 70 | Temp 98.2°F | Ht 69.0 in | Wt 246.0 lb

## 2022-01-31 DIAGNOSIS — E785 Hyperlipidemia, unspecified: Secondary | ICD-10-CM | POA: Diagnosis not present

## 2022-01-31 DIAGNOSIS — I10 Essential (primary) hypertension: Secondary | ICD-10-CM | POA: Diagnosis not present

## 2022-01-31 DIAGNOSIS — Z23 Encounter for immunization: Secondary | ICD-10-CM | POA: Diagnosis not present

## 2022-01-31 DIAGNOSIS — M109 Gout, unspecified: Secondary | ICD-10-CM | POA: Diagnosis not present

## 2022-01-31 DIAGNOSIS — C61 Malignant neoplasm of prostate: Secondary | ICD-10-CM

## 2022-01-31 LAB — COMPREHENSIVE METABOLIC PANEL
ALT: 16 U/L (ref 0–53)
AST: 18 U/L (ref 0–37)
Albumin: 4 g/dL (ref 3.5–5.2)
Alkaline Phosphatase: 91 U/L (ref 39–117)
BUN: 11 mg/dL (ref 6–23)
CO2: 30 mEq/L (ref 19–32)
Calcium: 9.1 mg/dL (ref 8.4–10.5)
Chloride: 103 mEq/L (ref 96–112)
Creatinine, Ser: 1.06 mg/dL (ref 0.40–1.50)
GFR: 66.65 mL/min (ref >60.00)
Glucose, Bld: 106 mg/dL — ABNORMAL HIGH (ref 70–99)
Potassium: 3.4 mEq/L — ABNORMAL LOW (ref 3.5–5.1)
Sodium: 140 mEq/L (ref 135–145)
Total Bilirubin: 0.5 mg/dL (ref 0.2–1.2)
Total Protein: 7.1 g/dL (ref 6.0–8.3)

## 2022-01-31 NOTE — Assessment & Plan Note (Signed)
Recurrent attacks - better Cont w/Prednisone bursts prn Norco prn  Potential benefits of a short/long term opioids use as well as potential risks (i.e. addiction risk, apnea etc) and complications (i.e. Somnolence, constipation and others) were explained to the patient and were aknowledged.

## 2022-01-31 NOTE — Assessment & Plan Note (Signed)
F/u w/Dr Jeffie Pollock

## 2022-01-31 NOTE — Addendum Note (Signed)
Addended by: Basil Dess on: 01/31/2022 10:41 AM   Modules accepted: Orders

## 2022-01-31 NOTE — Assessment & Plan Note (Signed)
  Continue to take Amlodipine, Catapress, Furosemide, Losartan

## 2022-01-31 NOTE — Progress Notes (Signed)
Subjective:  Patient ID: Jeffrey Carter, male    DOB: Jun 26, 1940  Age: 82 y.o. MRN: [id number]  CC: Follow-up   HPI Jeffrey Carter presents for gout, HTN, prostate cancer On a diet  Outpatient Medications Prior to Visit  Medication Sig Dispense Refill   acetaminophen (TYLENOL) 325 MG tablet Take 2 tablets (650 mg total) by mouth every 8 (eight) hours. 30 tablet 0   amLODipine (NORVASC) 10 MG tablet Take 1 tablet (10 mg total) by mouth daily. 90 tablet 3   cloNIDine (CATAPRES) 0.3 MG tablet Take 1 tablet (0.3 mg total) by mouth 2 (two) times daily. 180 tablet 1   EQ ALLERGY RELIEF 10 MG tablet TAKE ONE TABLET BY MOUTH ONCE DAILY 90 tablet 3   finasteride (PROSCAR) 5 MG tablet Take 5 mg by mouth daily.     fluticasone (FLONASE) 50 MCG/ACT nasal spray Place 2 sprays into both nostrils daily. (Patient taking differently: Place 2 sprays into both nostrils daily as needed for allergies.) 16 g 2   furosemide (LASIX) 40 MG tablet Take 1 tablet by mouth once daily 90 tablet 3   HYDROcodone-acetaminophen (NORCO) 7.5-325 MG tablet Take 1 tablet by mouth every 6 (six) hours as needed for moderate pain. 60 tablet 0   losartan (COZAAR) 100 MG tablet Take 1 tablet (100 mg total) by mouth daily. 90 tablet 1   lovastatin (MEVACOR) 40 MG tablet TAKE 1 TABLET BY MOUTH ONCE DAILY AT 6 PM IN THE EVENING 90 tablet 3   Potassium Chloride ER 20 MEQ TBCR Take 1 tablet by mouth once daily 90 tablet 3   predniSONE (DELTASONE) 10 MG tablet TAKE 3 TABLETS BY MOUTH ON DAY 1, THEN 2 TABLETS ON DAY 2 THEN 1 TABLET ON DAY 3 AS NEEDED FOR GOUT ATTACK. TAKE AFTER MEALS 18 tablet 0   sildenafil (VIAGRA) 100 MG tablet Take 1 tablet (100 mg total) by mouth as needed for erectile dysfunction. 12 tablet 5   tamsulosin (FLOMAX) 0.4 MG CAPS capsule TAKE 1 CAPSULE BY MOUTH  DAILY (Patient taking differently: Take 0.4 mg by mouth 2 (two) times daily.) 90 capsule 1   No facility-administered medications prior to visit.     ROS: Review of Systems  Constitutional:  Negative for appetite change, fatigue and unexpected weight change.  HENT:  Negative for congestion, nosebleeds, sneezing, sore throat and trouble swallowing.   Eyes:  Negative for itching and visual disturbance.  Respiratory:  Negative for cough.   Cardiovascular:  Negative for chest pain, palpitations and leg swelling.  Gastrointestinal:  Negative for abdominal distention, blood in stool, diarrhea and nausea.  Genitourinary:  Negative for frequency and hematuria.  Musculoskeletal:  Positive for arthralgias. Negative for back pain, gait problem, joint swelling and neck pain.  Skin:  Negative for rash.  Neurological:  Negative for dizziness, tremors, speech difficulty and weakness.  Psychiatric/Behavioral:  Negative for agitation, dysphoric mood and sleep disturbance. The patient is not nervous/anxious.     Objective:  BP 130/78 (BP Location: Right Arm, Patient Position: Sitting, Cuff Size: Normal)   Pulse 70   Temp 98.2 F (36.8 C) (Oral)   Ht '5\' 9"'$  (1.753 m)   Wt 246 lb (111.6 kg)   SpO2 98%   BMI 36.33 kg/m   BP Readings from Last 3 Encounters:  01/31/22 130/78  09/14/21 (!) 140/90  06/14/21 140/80    Wt Readings from Last 3 Encounters:  01/31/22 246 lb (111.6 kg)  09/14/21 257 lb 3.2  oz (116.7 kg)  06/14/21 255 lb (115.7 kg)    Physical Exam Constitutional:      General: He is not in acute distress.    Appearance: He is well-developed. He is obese.     Comments: NAD  Eyes:     Conjunctiva/sclera: Conjunctivae normal.     Pupils: Pupils are equal, round, and reactive to light.  Neck:     Thyroid: No thyromegaly.     Vascular: No JVD.  Cardiovascular:     Rate and Rhythm: Normal rate and regular rhythm.     Heart sounds: Normal heart sounds. No murmur heard.    No friction rub. No gallop.  Pulmonary:     Effort: Pulmonary effort is normal. No respiratory distress.     Breath sounds: Normal breath sounds. No  wheezing or rales.  Chest:     Chest wall: No tenderness.  Abdominal:     General: Bowel sounds are normal. There is no distension.     Palpations: Abdomen is soft. There is no mass.     Tenderness: There is no abdominal tenderness. There is no guarding or rebound.  Musculoskeletal:        General: No tenderness. Normal range of motion.     Cervical back: Normal range of motion.  Lymphadenopathy:     Cervical: No cervical adenopathy.  Skin:    General: Skin is warm and dry.     Findings: No rash.  Neurological:     Mental Status: He is alert and oriented to person, place, and time.     Cranial Nerves: No cranial nerve deficit.     Motor: No abnormal muscle tone.     Coordination: Coordination normal.     Gait: Gait normal.     Deep Tendon Reflexes: Reflexes are normal and symmetric.  Psychiatric:        Behavior: Behavior normal.        Thought Content: Thought content normal.        Judgment: Judgment normal.     Lab Results  Component Value Date   WBC 3.4 (L) 10/15/2019   HGB 13.2 10/15/2019   HCT 39.9 10/15/2019   PLT 161 10/15/2019   GLUCOSE 99 09/14/2021   CHOL 131 10/15/2019   TRIG 67 10/15/2019   HDL 43 10/15/2019   LDLCALC 74 10/15/2019   ALT 13 09/14/2021   AST 14 09/14/2021   NA 142 09/14/2021   K 3.5 09/14/2021   CL 104 09/14/2021   CREATININE 1.18 09/14/2021   BUN 18 09/14/2021   CO2 31 09/14/2021   TSH 2.26 10/15/2019   PSA 0.11 10/15/2019   INR 1.01 10/12/2014   HGBA1C 6.2 06/14/2021    DG Foot Complete Left  Result Date: 06/07/2019 CLINICAL DATA:  Left heel pain for 1 week common no known injury, initial encounter EXAM: LEFT FOOT - COMPLETE 3+ VIEW COMPARISON:  None. FINDINGS: No acute fracture or dislocation is noted. Calcaneal spurs are seen. No soft tissue abnormality is noted. Mild hallux valgus deformity is seen. IMPRESSION: Degenerative changes without acute abnormality. Electronically Signed   By: Inez Catalina M.D.   On: 06/07/2019 23:03     Assessment & Plan:   Problem List Items Addressed This Visit       Cardiovascular and Mediastinum   Essential hypertension - Primary     Continue to take Amlodipine, Catapress, Furosemide, Losartan      Relevant Orders   Comprehensive metabolic panel  Musculoskeletal and Integument   Gout of big toe    Recurrent attacks - better Cont w/Prednisone bursts prn Norco prn  Potential benefits of a short/long term opioids use as well as potential risks (i.e. addiction risk, apnea etc) and complications (i.e. Somnolence, constipation and others) were explained to the patient and were aknowledged.        Genitourinary   Prostate cancer ([place])    F/u w/Dr Jeffie Pollock         Other   Dyslipidemia    Cont on Lovastatin 20 mg daily      Relevant Orders   Comprehensive metabolic panel      No orders of the defined types were placed in this encounter.     Follow-up: Return in about 4 months (around 06/01/2022) for a follow-up visit.  [name], MD

## 2022-01-31 NOTE — Assessment & Plan Note (Signed)
Cont on Lovastatin 20 mg daily

## 2022-02-15 ENCOUNTER — Ambulatory Visit (HOSPITAL_BASED_OUTPATIENT_CLINIC_OR_DEPARTMENT_OTHER): Payer: Medicare Other | Admitting: Pulmonary Disease

## 2022-03-04 ENCOUNTER — Telehealth: Payer: Self-pay

## 2022-03-04 NOTE — Telephone Encounter (Signed)
N/A unable to leave a message (VM full)  for patient to call back to schedule Medicare Annual Wellness Visit   Last AWV  09/08/20  Please schedule at anytime with [facility] [place] if patient calls the office back.    30 Minutes appointment   Any questions, please call me at [phone]

## 2022-03-08 ENCOUNTER — Encounter (HOSPITAL_BASED_OUTPATIENT_CLINIC_OR_DEPARTMENT_OTHER): Payer: Self-pay | Admitting: Pulmonary Disease

## 2022-03-08 ENCOUNTER — Ambulatory Visit (INDEPENDENT_AMBULATORY_CARE_PROVIDER_SITE_OTHER): Payer: Medicare Other | Admitting: Pulmonary Disease

## 2022-03-08 VITALS — BP 142/76 | HR 80 | Temp 98.5°F | Wt 246.0 lb

## 2022-03-08 DIAGNOSIS — G4733 Obstructive sleep apnea (adult) (pediatric): Secondary | ICD-10-CM | POA: Diagnosis not present

## 2022-03-08 NOTE — Progress Notes (Signed)
[name]  Chief Complaint  Patient presents with   Follow-up    Follow up for OSA. Pt states that he is doing well with the cpap machine and is having no issues noted with it so far.      Constitutional:  BP (!) 142/76 (BP Location: Left Arm, Patient Position: Sitting, Cuff Size: Normal)   Pulse 80   Temp 98.5 F (36.9 C) (Oral)   Wt 246 lb (111.6 kg)   SpO2 96%   BMI 36.33 kg/m   Past Medical History:  Prostate cancer, PACs, OA, HTN, HLD, Colon polyps, Gout, BPH  Past Surgical History:  His  has a past surgical history that includes Total knee arthroplasty (Right, 06-22-2007); Colonoscopy (07-06-2006); Tonsillectomy (as child); Appendectomy (1966); Prostate biopsy (N/A, 03/28/2014); Gold seed implant (N/A, 08/12/2014); and Green light laser turp (transurethral resection of prostate (N/A, 07/20/2015).  Brief Summary:  Jeffrey Carter is a 82 y.o. male with obstructive sleep apnea.      Subjective:   Uses CPAP nightly.  No issues with mask fit.  Denies sinus congestion or dry mouth.  Feels rested during the day.  Physical Exam:   Appearance - well kempt   ENMT - no sinus tenderness, no oral exudate, no LAN, Mallampati 4 airway, no stridor, poor dentition  Respiratory - equal breath sounds bilaterally, no wheezing or rales  CV - s1s2 regular rate and rhythm, no murmurs  Ext - no clubbing, no edema  Skin - no rashes  Psych - normal mood and affect     Sleep Tests:  PSG 02/29/12 >> AHI 32, SpO2 low 79% CPAP 02/05/22 to 03/06/22 >> used on 30 of 30 nights with average 6 hrs 35 min.  Average AHI 2.8 with CPAP 12 cm H2O.  Social History:  He  reports that he quit smoking about 60 years ago. His smoking use included cigarettes. He has a 2.00 pack-year smoking history. He has never used smokeless tobacco. He reports that he does not drink alcohol and does not use drugs.  Family History:  His family history includes Cancer  in his brother; Diabetes in his father.     Assessment/Plan:   Obstructive sleep apnea. - he is compliant with CPAP and reports benefit from therapy - uses [facility] for his DME - current CPAP ordered October 2021 - continue CPAP 12 cm H2O  Obesity. - reviewed importance of weight loss  Time Spent Involved in Patient Care on Day of Examination:  15 minutes  Follow up:   Patient Instructions  Follow up in 1 year  Medication List:   Allergies as of 03/08/2022       Reactions   Percocet [oxycodone-acetaminophen]    nausea        Medication List        Accurate as of March 08, 2022  2:24 PM. If you have any questions, ask your nurse or doctor.          acetaminophen 325 MG tablet Commonly known as: TYLENOL Take 2 tablets (650 mg total) by mouth every 8 (eight) hours.   amLODipine 10 MG tablet Commonly known as: NORVASC Take 1 tablet (10 mg total) by mouth daily.   cloNIDine 0.3 MG tablet Commonly known as: CATAPRES Take 1 tablet (0.3 mg total) by mouth 2 (two) times daily.   EQ Allergy Relief 10 MG tablet Generic drug: loratadine TAKE ONE TABLET BY MOUTH ONCE DAILY   finasteride 5 MG  tablet Commonly known as: PROSCAR Take 5 mg by mouth daily.   fluticasone 50 MCG/ACT nasal spray Commonly known as: FLONASE Place 2 sprays into both nostrils daily. What changed:  when to take this reasons to take this   furosemide 40 MG tablet Commonly known as: LASIX Take 1 tablet by mouth once daily   HYDROcodone-acetaminophen 7.5-325 MG tablet Commonly known as: NORCO Take 1 tablet by mouth every 6 (six) hours as needed for moderate pain.   losartan 100 MG tablet Commonly known as: COZAAR Take 1 tablet (100 mg total) by mouth daily.   lovastatin 40 MG tablet Commonly known as: MEVACOR TAKE 1 TABLET BY MOUTH ONCE DAILY AT 6 PM IN THE EVENING   Potassium Chloride ER 20 MEQ Tbcr Take 1 tablet by mouth once daily   predniSONE 10 MG tablet Commonly  known as: DELTASONE TAKE 3 TABLETS BY MOUTH ON DAY 1, THEN 2 TABLETS ON DAY 2 THEN 1 TABLET ON DAY 3 AS NEEDED FOR GOUT ATTACK. TAKE AFTER MEALS   sildenafil 100 MG tablet Commonly known as: Viagra Take 1 tablet (100 mg total) by mouth as needed for erectile dysfunction.   tamsulosin 0.4 MG Caps capsule Commonly known as: FLOMAX TAKE 1 CAPSULE BY MOUTH  DAILY What changed: when to take this        Signature:  Jeffrey Mires, MD [place] Pager - [phone] 03/08/2022, 2:24 PM

## 2022-03-08 NOTE — Patient Instructions (Signed)
Follow up in 1 year.

## 2022-03-24 ENCOUNTER — Other Ambulatory Visit: Payer: Self-pay | Admitting: Internal Medicine

## 2022-03-27 ENCOUNTER — Other Ambulatory Visit: Payer: Self-pay | Admitting: Internal Medicine

## 2022-04-26 ENCOUNTER — Other Ambulatory Visit: Payer: Self-pay | Admitting: Internal Medicine

## 2022-04-26 ENCOUNTER — Telehealth: Payer: Self-pay | Admitting: Internal Medicine

## 2022-04-26 MED ORDER — PREDNISONE 10 MG PO TABS: ORAL_TABLET | ORAL | 0 refills | Status: DC

## 2022-04-26 NOTE — Telephone Encounter (Signed)
C/o gout Refill Rx - steroids.  Done

## 2022-05-31 ENCOUNTER — Encounter: Payer: Self-pay | Admitting: Internal Medicine

## 2022-05-31 ENCOUNTER — Ambulatory Visit (INDEPENDENT_AMBULATORY_CARE_PROVIDER_SITE_OTHER): Payer: Medicare Other | Admitting: Internal Medicine

## 2022-05-31 VITALS — BP 130/78 | HR 59 | Temp 98.3°F | Ht 69.0 in | Wt 242.0 lb

## 2022-05-31 DIAGNOSIS — R739 Hyperglycemia, unspecified: Secondary | ICD-10-CM

## 2022-05-31 DIAGNOSIS — J399 Disease of upper respiratory tract, unspecified: Secondary | ICD-10-CM

## 2022-05-31 DIAGNOSIS — I10 Essential (primary) hypertension: Secondary | ICD-10-CM

## 2022-05-31 DIAGNOSIS — M109 Gout, unspecified: Secondary | ICD-10-CM | POA: Diagnosis not present

## 2022-05-31 DIAGNOSIS — R051 Acute cough: Secondary | ICD-10-CM | POA: Diagnosis not present

## 2022-05-31 LAB — POC COVID19 BINAXNOW: SARS Coronavirus 2 Ag: NEGATIVE

## 2022-05-31 MED ORDER — CEFDINIR 300 MG PO CAPS: 300.0000 mg | ORAL_CAPSULE | Freq: Two times a day (BID) | ORAL | 0 refills | Status: DC

## 2022-05-31 MED ORDER — HYDROCODONE-ACETAMINOPHEN 7.5-325 MG PO TABS: 1.0000 | ORAL_TABLET | Freq: Four times a day (QID) | ORAL | 0 refills | Status: DC | PRN

## 2022-05-31 MED ORDER — PROMETHAZINE-DM 6.25-15 MG/5ML PO SYRP: 5.0000 mL | ORAL_SOLUTION | Freq: Four times a day (QID) | ORAL | 0 refills | Status: DC | PRN

## 2022-05-31 MED ORDER — PREDNISONE 10 MG PO TABS: ORAL_TABLET | ORAL | 1 refills | Status: DC

## 2022-05-31 NOTE — Assessment & Plan Note (Signed)
Bronchitis, acute COVID test (-) Omnicef po Prom cough syr OTC cold meds

## 2022-05-31 NOTE — Assessment & Plan Note (Signed)
Recurrent attacks Cont w/Prednisone bursts prn Norco prn  Potential benefits of a short/long term opioids use as well as potential risks (i.e. addiction risk, apnea etc) and complications (i.e. Somnolence, constipation and others) were explained to the patient and were aknowledged.

## 2022-05-31 NOTE — Addendum Note (Signed)
Addended by: Aundra Millet on: 05/31/2022 04:27 PM   Modules accepted: Orders

## 2022-05-31 NOTE — Assessment & Plan Note (Signed)
Continue to take Amlodipine, Catapress, Furosemide, Losartan °

## 2022-05-31 NOTE — Assessment & Plan Note (Signed)
Monitoring at home w/wife's glucometer

## 2022-05-31 NOTE — Progress Notes (Signed)
Subjective:  Patient ID: Jeffrey Carter, male    DOB: [birth year]  Age: 82 y.o. MRN: [id number]  CC: No chief complaint on file.   HPI Jeffrey Carter presents for ST/URI sx's x 4 d  F/u gout  Outpatient Medications Prior to Visit  Medication Sig Dispense Refill   acetaminophen (TYLENOL) 325 MG tablet Take 2 tablets (650 mg total) by mouth every 8 (eight) hours. 30 tablet 0   amLODipine (NORVASC) 10 MG tablet Take 1 tablet (10 mg total) by mouth daily. 90 tablet 3   cloNIDine (CATAPRES) 0.3 MG tablet Take 1 tablet by mouth twice daily 180 tablet 3   EQ ALLERGY RELIEF 10 MG tablet TAKE ONE TABLET BY MOUTH ONCE DAILY 90 tablet 3   finasteride (PROSCAR) 5 MG tablet Take 5 mg by mouth daily.     fluticasone (FLONASE) 50 MCG/ACT nasal spray Place 2 sprays into both nostrils daily. (Patient taking differently: Place 2 sprays into both nostrils daily as needed for allergies.) 16 g 2   furosemide (LASIX) 40 MG tablet Take 1 tablet by mouth once daily 90 tablet 0   losartan (COZAAR) 100 MG tablet Take 1 tablet (100 mg total) by mouth daily. 90 tablet 1   lovastatin (MEVACOR) 40 MG tablet TAKE 1 TABLET BY MOUTH ONCE DAILY AT 6 PM IN THE EVENING 90 tablet 3   Potassium Chloride ER 20 MEQ TBCR Take 1 tablet by mouth once daily 90 tablet 1   sildenafil (VIAGRA) 100 MG tablet Take 1 tablet (100 mg total) by mouth as needed for erectile dysfunction. 12 tablet 5   tamsulosin (FLOMAX) 0.4 MG CAPS capsule TAKE 1 CAPSULE BY MOUTH  DAILY (Patient taking differently: Take 0.4 mg by mouth 2 (two) times daily.) 90 capsule 1   HYDROcodone-acetaminophen (NORCO) 7.5-325 MG tablet Take 1 tablet by mouth every 6 (six) hours as needed for moderate pain. 60 tablet 0   predniSONE (DELTASONE) 10 MG tablet TAKE 3 TABLETS BY MOUTH ON DAY 1, THEN 2 TABLETS ON DAY 2 THEN 1 TABLET ON DAY 3 AS NEEDED FOR GOUT ATTACK. TAKE AFTER MEALS 18 tablet 0   No facility-administered medications prior to visit.    ROS: Review of  Systems  Constitutional:  Positive for chills and fatigue. Negative for appetite change and unexpected weight change.  HENT:  Positive for congestion. Negative for nosebleeds, sneezing, sore throat and trouble swallowing.   Eyes:  Negative for itching and visual disturbance.  Respiratory:  Positive for cough and wheezing. Negative for shortness of breath.   Cardiovascular:  Negative for chest pain, palpitations and leg swelling.  Gastrointestinal:  Negative for abdominal distention, blood in stool, diarrhea and nausea.  Genitourinary:  Negative for frequency and hematuria.  Musculoskeletal:  Positive for arthralgias. Negative for back pain, gait problem, joint swelling and neck pain.  Skin:  Negative for rash.  Neurological:  Negative for dizziness, tremors, speech difficulty and weakness.  Psychiatric/Behavioral:  Negative for agitation, dysphoric mood and sleep disturbance. The patient is not nervous/anxious.     Objective:  BP 130/78 (BP Location: Left Arm, Patient Position: Sitting, Cuff Size: Normal)   Pulse (!) 59   Temp 98.3 F (36.8 C) (Oral)   Ht 5\' 9"  (1.753 m)   Wt 242 lb (109.8 kg)   SpO2 95%   BMI 35.74 kg/m   BP Readings from Last 3 Encounters:  05/31/22 130/78  03/08/22 (!) 142/76  01/31/22 130/78    Wt Readings from  Last 3 Encounters:  05/31/22 242 lb (109.8 kg)  03/08/22 246 lb (111.6 kg)  01/31/22 246 lb (111.6 kg)    Physical Exam Constitutional:      General: He is not in acute distress.    Appearance: He is well-developed. He is obese.     Comments: NAD  HENT:     Left Ear: Tympanic membrane normal. There is no impacted cerumen.     Nose: Congestion and rhinorrhea present.     Mouth/Throat:     Pharynx: Posterior oropharyngeal erythema present.  Eyes:     Conjunctiva/sclera: Conjunctivae normal.     Pupils: Pupils are equal, round, and reactive to light.  Neck:     Thyroid: No thyromegaly.     Vascular: No JVD.  Cardiovascular:     Rate and  Rhythm: Normal rate and regular rhythm.     Heart sounds: Normal heart sounds. No murmur heard.    No friction rub. No gallop.  Pulmonary:     Effort: Pulmonary effort is normal. No respiratory distress.     Breath sounds: Rhonchi present. No wheezing or rales.  Chest:     Chest wall: No tenderness.  Abdominal:     General: Bowel sounds are normal. There is no distension.     Palpations: Abdomen is soft. There is no mass.     Tenderness: There is no abdominal tenderness. There is no guarding or rebound.  Musculoskeletal:        General: No tenderness. Normal range of motion.     Cervical back: Normal range of motion.  Lymphadenopathy:     Cervical: No cervical adenopathy.  Skin:    General: Skin is warm and dry.     Findings: No rash.  Neurological:     Mental Status: He is alert and oriented to person, place, and time.     Cranial Nerves: No cranial nerve deficit.     Motor: No abnormal muscle tone.     Coordination: Coordination normal.     Gait: Gait normal.     Deep Tendon Reflexes: Reflexes are normal and symmetric.  Psychiatric:        Behavior: Behavior normal.        Thought Content: Thought content normal.        Judgment: Judgment normal.   R TM red Throat w/erythema Rhonchi B    Lab Results  Component Value Date   WBC 3.4 (L) 10/15/2019   HGB 13.2 10/15/2019   HCT 39.9 10/15/2019   PLT 161 10/15/2019   GLUCOSE 106 (H) 01/31/2022   CHOL 131 10/15/2019   TRIG 67 10/15/2019   HDL 43 10/15/2019   LDLCALC 74 10/15/2019   ALT 16 01/31/2022   AST 18 01/31/2022   NA 140 01/31/2022   K 3.4 (L) 01/31/2022   CL 103 01/31/2022   CREATININE 1.06 01/31/2022   BUN 11 01/31/2022   CO2 30 01/31/2022   TSH 2.26 10/15/2019   PSA 0.11 10/15/2019   INR 1.01 10/12/2014   HGBA1C 6.2 06/14/2021    DG Foot Complete Left  Result Date: 06/07/2019 CLINICAL DATA:  Left heel pain for 1 week common no known injury, initial encounter EXAM: LEFT FOOT - COMPLETE 3+ VIEW  COMPARISON:  None. FINDINGS: No acute fracture or dislocation is noted. Calcaneal spurs are seen. No soft tissue abnormality is noted. Mild hallux valgus deformity is seen. IMPRESSION: Degenerative changes without acute abnormality. Electronically Signed   By: Eulah Pont.D.  On: 06/07/2019 23:03    Assessment & Plan:   Problem List Items Addressed This Visit     Essential hypertension     Continue to take Amlodipine, Catapress, Furosemide, Losartan      Gout of big toe - Primary    Recurrent attacks Cont w/Prednisone bursts prn Norco prn  Potential benefits of a short/long term opioids use as well as potential risks (i.e. addiction risk, apnea etc) and complications (i.e. Somnolence, constipation and others) were explained to the patient and were aknowledged.      Relevant Medications   HYDROcodone-acetaminophen (NORCO) 7.5-325 MG tablet   predniSONE (DELTASONE) 10 MG tablet   Upper respiratory disease    Bronchitis, acute COVID test (-) Omnicef po Prom cough syr OTC cold meds      Hyperglycemia    Monitoring at home w/wife's glucometer         Meds ordered this encounter  Medications   promethazine-dextromethorphan (PROMETHAZINE-DM) 6.25-15 MG/5ML syrup    Sig: Take 5 mLs by mouth 4 (four) times daily as needed for cough.    Dispense:  240 mL    Refill:  0   cefdinir (OMNICEF) 300 MG capsule    Sig: Take 1 capsule (300 mg total) by mouth 2 (two) times daily.    Dispense:  14 capsule    Refill:  0   HYDROcodone-acetaminophen (NORCO) 7.5-325 MG tablet    Sig: Take 1 tablet by mouth every 6 (six) hours as needed for moderate pain.    Dispense:  60 tablet    Refill:  0   predniSONE (DELTASONE) 10 MG tablet    Sig: TAKE 3 TABLETS BY MOUTH ON DAY 1, THEN 2 TABLETS ON DAY 2 THEN 1 TABLET ON DAY 3 AS NEEDED FOR GOUT ATTACK. TAKE AFTER MEALS    Dispense:  18 tablet    Refill:  1      Follow-up: No follow-ups on file.  Sonda Primes, MD

## 2022-06-20 ENCOUNTER — Other Ambulatory Visit: Payer: Self-pay | Admitting: Internal Medicine

## 2022-07-01 ENCOUNTER — Other Ambulatory Visit: Payer: Self-pay | Admitting: Internal Medicine

## 2022-07-06 ENCOUNTER — Other Ambulatory Visit: Payer: Self-pay | Admitting: Internal Medicine

## 2022-08-03 DIAGNOSIS — G4733 Obstructive sleep apnea (adult) (pediatric): Secondary | ICD-10-CM | POA: Diagnosis not present

## 2022-08-31 ENCOUNTER — Ambulatory Visit: Payer: Medicare Other | Admitting: Internal Medicine

## 2022-08-31 ENCOUNTER — Encounter: Payer: Self-pay | Admitting: Internal Medicine

## 2022-08-31 VITALS — BP 130/80 | HR 69 | Temp 97.9°F | Ht 69.0 in | Wt 246.0 lb

## 2022-08-31 DIAGNOSIS — E785 Hyperlipidemia, unspecified: Secondary | ICD-10-CM | POA: Diagnosis not present

## 2022-08-31 DIAGNOSIS — M1000 Idiopathic gout, unspecified site: Secondary | ICD-10-CM

## 2022-08-31 DIAGNOSIS — M109 Gout, unspecified: Secondary | ICD-10-CM

## 2022-08-31 DIAGNOSIS — I10 Essential (primary) hypertension: Secondary | ICD-10-CM | POA: Diagnosis not present

## 2022-08-31 DIAGNOSIS — R739 Hyperglycemia, unspecified: Secondary | ICD-10-CM | POA: Diagnosis not present

## 2022-08-31 MED ORDER — PREDNISONE 10 MG PO TABS: ORAL_TABLET | ORAL | 1 refills | Status: DC

## 2022-08-31 MED ORDER — ALLOPURINOL 100 MG PO TABS
50.0000 mg | ORAL_TABLET | Freq: Every day | ORAL | 3 refills | Status: DC
Start: 2022-08-31 — End: 2023-08-17

## 2022-08-31 NOTE — Assessment & Plan Note (Signed)
Cont on Lovastatin 20 mg daily 

## 2022-08-31 NOTE — Assessment & Plan Note (Signed)
Continue to take Amlodipine, Catapress, Furosemide, Losartan °

## 2022-08-31 NOTE — Assessment & Plan Note (Signed)
Prednisone po prn - see Rx, Norco prn.  Allopurinol option was discussed - pt agreed  Potential benefits of a short term opioids use as well as potential risks (i.e. addiction risk, apnea etc) and complications (i.e. Somnolence, constipation and others) were explained to the patient and were aknowledged.

## 2022-08-31 NOTE — Progress Notes (Signed)
Subjective:  Patient ID: Jeffrey Carter, male    DOB: [birth year]  Age: 82 y.o. MRN: [id number]  CC: Follow-up (3 MNTH F/U)   HPI MALAKIAH MATHIESEN presents for gout, HTN, CRI, dyslipidemia  Outpatient Medications Prior to Visit  Medication Sig Dispense Refill   acetaminophen (TYLENOL) 325 MG tablet Take 2 tablets (650 mg total) by mouth every 8 (eight) hours. 30 tablet 0   amLODipine (NORVASC) 10 MG tablet Take 1 tablet (10 mg total) by mouth daily. 90 tablet 3   cloNIDine (CATAPRES) 0.3 MG tablet Take 1 tablet by mouth twice daily 180 tablet 3   EQ ALLERGY RELIEF 10 MG tablet TAKE ONE TABLET BY MOUTH ONCE DAILY 90 tablet 3   finasteride (PROSCAR) 5 MG tablet Take 5 mg by mouth daily.     fluticasone (FLONASE) 50 MCG/ACT nasal spray Place 2 sprays into both nostrils daily. (Patient taking differently: Place 2 sprays into both nostrils daily as needed for allergies.) 16 g 2   furosemide (LASIX) 40 MG tablet Take 1 tablet (40 mg total) by mouth daily. Annual appt due in Aug w/labs must see provider for future refills 90 tablet 0   HYDROcodone-acetaminophen (NORCO) 7.5-325 MG tablet Take 1 tablet by mouth every 6 (six) hours as needed for moderate pain. 60 tablet 0   losartan (COZAAR) 100 MG tablet Take 1 tablet (100 mg total) by mouth daily. Annual appt is due w/labs must see provider for future refills 90 tablet 0   lovastatin (MEVACOR) 40 MG tablet TAKE 1 TABLET BY MOUTH ONCE DAILY IN THE EVENING AT  6  PM 90 tablet 1   Potassium Chloride ER 20 MEQ TBCR Take 1 tablet by mouth once daily 90 tablet 1   sildenafil (VIAGRA) 100 MG tablet Take 1 tablet (100 mg total) by mouth as needed for erectile dysfunction. 12 tablet 5   tamsulosin (FLOMAX) 0.4 MG CAPS capsule TAKE 1 CAPSULE BY MOUTH  DAILY (Patient taking differently: Take 0.4 mg by mouth 2 (two) times daily.) 90 capsule 1   cefdinir (OMNICEF) 300 MG capsule Take 1 capsule (300 mg total) by mouth 2 (two) times daily. 14 capsule 0    predniSONE (DELTASONE) 10 MG tablet TAKE 3 TABLETS BY MOUTH ON DAY 1, THEN 2 TABLETS ON DAY 2 THEN 1 TABLET ON DAY 3 AS NEEDED FOR GOUT ATTACK. TAKE AFTER MEALS 18 tablet 1   promethazine-dextromethorphan (PROMETHAZINE-DM) 6.25-15 MG/5ML syrup Take 5 mLs by mouth 4 (four) times daily as needed for cough. 240 mL 0   No facility-administered medications prior to visit.    ROS: Review of Systems  Constitutional:  Negative for appetite change, fatigue and unexpected weight change.  HENT:  Negative for congestion, nosebleeds, sneezing, sore throat and trouble swallowing.   Eyes:  Negative for itching and visual disturbance.  Respiratory:  Negative for cough.   Cardiovascular:  Negative for chest pain, palpitations and leg swelling.  Gastrointestinal:  Negative for abdominal distention, blood in stool, diarrhea and nausea.  Genitourinary:  Negative for frequency and hematuria.  Musculoskeletal:  Positive for arthralgias. Negative for back pain, gait problem, joint swelling and neck pain.  Skin:  Negative for color change and rash.  Neurological:  Negative for dizziness, tremors, speech difficulty and weakness.  Psychiatric/Behavioral:  Negative for agitation, dysphoric mood and sleep disturbance. The patient is not nervous/anxious.     Objective:  BP 130/80 (BP Location: Right Arm, Patient Position: Sitting, Cuff Size: Large)   Pulse  69   Temp 97.9 F (36.6 C) (Oral)   Ht 5\' 9"  (1.753 m)   Wt 246 lb (111.6 kg)   SpO2 95%   BMI 36.33 kg/m   BP Readings from Last 3 Encounters:  08/31/22 130/80  05/31/22 130/78  03/08/22 (!) 142/76    Wt Readings from Last 3 Encounters:  08/31/22 246 lb (111.6 kg)  05/31/22 242 lb (109.8 kg)  03/08/22 246 lb (111.6 kg)    Physical Exam Constitutional:      General: He is not in acute distress.    Appearance: He is well-developed. He is obese.     Comments: NAD  Eyes:     Conjunctiva/sclera: Conjunctivae normal.     Pupils: Pupils are equal,  round, and reactive to light.  Neck:     Thyroid: No thyromegaly.     Vascular: No JVD.  Cardiovascular:     Rate and Rhythm: Normal rate and regular rhythm.     Heart sounds: Normal heart sounds. No murmur heard.    No friction rub. No gallop.  Pulmonary:     Effort: Pulmonary effort is normal. No respiratory distress.     Breath sounds: Normal breath sounds. No wheezing or rales.  Chest:     Chest wall: No tenderness.  Abdominal:     General: Bowel sounds are normal. There is no distension.     Palpations: Abdomen is soft. There is no mass.     Tenderness: There is no abdominal tenderness. There is no guarding or rebound.  Musculoskeletal:        General: No tenderness. Normal range of motion.     Cervical back: Normal range of motion.  Lymphadenopathy:     Cervical: No cervical adenopathy.  Skin:    General: Skin is warm and dry.     Findings: No rash.  Neurological:     Mental Status: He is alert and oriented to person, place, and time.     Cranial Nerves: No cranial nerve deficit.     Motor: No abnormal muscle tone.     Coordination: Coordination normal.     Gait: Gait normal.     Deep Tendon Reflexes: Reflexes are normal and symmetric.  Psychiatric:        Behavior: Behavior normal.        Thought Content: Thought content normal.        Judgment: Judgment normal.     Lab Results  Component Value Date   WBC 3.4 (L) 10/15/2019   HGB 13.2 10/15/2019   HCT 39.9 10/15/2019   PLT 161 10/15/2019   GLUCOSE 106 (H) 01/31/2022   CHOL 131 10/15/2019   TRIG 67 10/15/2019   HDL 43 10/15/2019   LDLCALC 74 10/15/2019   ALT 16 01/31/2022   AST 18 01/31/2022   NA 140 01/31/2022   K 3.4 (L) 01/31/2022   CL 103 01/31/2022   CREATININE 1.06 01/31/2022   BUN 11 01/31/2022   CO2 30 01/31/2022   TSH 2.26 10/15/2019   PSA 0.11 10/15/2019   INR 1.01 10/12/2014   HGBA1C 6.2 06/14/2021    DG Foot Complete Left  Result Date: 06/07/2019 CLINICAL DATA:  Left heel pain for 1  week common no known injury, initial encounter EXAM: LEFT FOOT - COMPLETE 3+ VIEW COMPARISON:  None. FINDINGS: No acute fracture or dislocation is noted. Calcaneal spurs are seen. No soft tissue abnormality is noted. Mild hallux valgus deformity is seen. IMPRESSION: Degenerative changes without acute abnormality.  Electronically Signed   By: Alcide Clever M.D.   On: 06/07/2019 23:03    Assessment & Plan:   Problem List Items Addressed This Visit     Dyslipidemia    Cont on Lovastatin 20 mg daily      Relevant Orders   Comprehensive metabolic panel   Uric acid   Essential hypertension - Primary     Continue to take Amlodipine, Catapress, Furosemide, Losartan      Relevant Orders   Uric acid   Gout of big toe     Prednisone po prn - see Rx, Norco prn.  Allopurinol option was discussed - pt agreed  Potential benefits of a short term opioids use as well as potential risks (i.e. addiction risk, apnea etc) and complications (i.e. Somnolence, constipation and others) were explained to the patient and were aknowledged.      Relevant Medications   predniSONE (DELTASONE) 10 MG tablet   allopurinol (ZYLOPRIM) 100 MG tablet   Gout attack     Prednisone po prn - see Rx, Norco prn. Use prn. Allopurinol option was discussed - pt agreed  Potential benefits of a short term opioids use as well as potential risks (i.e. addiction risk, apnea etc) and complications (i.e. Somnolence, constipation and others) were explained to the patient and were aknowledged.      Relevant Orders   Uric acid   Hemoglobin A1c   Hyperglycemia   Relevant Orders   Hemoglobin A1c      Meds ordered this encounter  Medications   predniSONE (DELTASONE) 10 MG tablet    Sig: TAKE 3 TABLETS BY MOUTH ON DAY 1, THEN 2 TABLETS ON DAY 2 THEN 1 TABLET ON DAY 3 AS NEEDED FOR GOUT ATTACK. TAKE AFTER MEALS    Dispense:  18 tablet    Refill:  1   allopurinol (ZYLOPRIM) 100 MG tablet    Sig: Take 0.5 tablets (50 mg total) by  mouth daily.    Dispense:  45 tablet    Refill:  3      Follow-up: No follow-ups on file.  Sonda Primes, MD

## 2022-08-31 NOTE — Assessment & Plan Note (Signed)
Prednisone po prn - see Rx, Norco prn. Use prn. Allopurinol option was discussed - pt agreed  Potential benefits of a short term opioids use as well as potential risks (i.e. addiction risk, apnea etc) and complications (i.e. Somnolence, constipation and others) were explained to the patient and were aknowledged.

## 2022-09-24 ENCOUNTER — Other Ambulatory Visit: Payer: Self-pay | Admitting: Internal Medicine

## 2022-09-27 ENCOUNTER — Telehealth: Payer: Self-pay | Admitting: Pulmonary Disease

## 2022-09-27 DIAGNOSIS — G4733 Obstructive sleep apnea (adult) (pediatric): Secondary | ICD-10-CM

## 2022-09-27 NOTE — Telephone Encounter (Signed)
DME company calling needing order for pt. Cpap supplies sent to Fax#[phone]

## 2022-09-27 NOTE — Telephone Encounter (Signed)
Order placed to [facility].

## 2022-09-28 ENCOUNTER — Telehealth: Payer: Self-pay | Admitting: Internal Medicine

## 2022-09-28 MED ORDER — AMLODIPINE BESYLATE 10 MG PO TABS: 10.0000 mg | ORAL_TABLET | Freq: Every day | ORAL | 3 refills | Status: DC

## 2022-09-28 MED ORDER — LOVASTATIN 40 MG PO TABS: ORAL_TABLET | ORAL | 1 refills | Status: DC

## 2022-09-28 MED ORDER — LOSARTAN POTASSIUM 100 MG PO TABS: 100.0000 mg | ORAL_TABLET | Freq: Every day | ORAL | 1 refills | Status: DC

## 2022-09-28 NOTE — Telephone Encounter (Signed)
Caller & Relationship to patient: Self  Call back number: [phone]   Date of last office visit: 8.7.24  Date of next office visit: 11.7.24  Medication(s) to be refilled:  losartan (COZAAR) 100 MG tablet   lovastatin (MEVACOR) 40 MG tablet   amLODipine (NORVASC) 10 MG tablet    Preferred Pharmacy:   [facility] [phone]   Phone: [phone]  Fax: [phone]   Our records indicated that the pt has refills available at the pharmacy, but the pharmacy states there are none.

## 2022-09-28 NOTE — Telephone Encounter (Signed)
Rx sent 

## 2022-10-11 NOTE — Progress Notes (Signed)
treated

## 2022-10-14 ENCOUNTER — Telehealth: Payer: Self-pay | Admitting: Pulmonary Disease

## 2022-10-14 NOTE — Telephone Encounter (Signed)
[facility] needs a prescription for patient's CPAP supplies and it needs to be signed by  Dr.Sood. What they received was not signed.

## 2022-10-22 ENCOUNTER — Other Ambulatory Visit: Payer: Self-pay | Admitting: Internal Medicine

## 2022-10-26 NOTE — Telephone Encounter (Signed)
New prescription requested

## 2022-11-21 ENCOUNTER — Telehealth: Payer: Self-pay | Admitting: Pulmonary Disease

## 2022-11-21 NOTE — Telephone Encounter (Signed)
Huntley Dec from [facility] is checking on fax for CPAP supplies. Huntley Dec phone number is [phone].

## 2022-11-21 NOTE — Telephone Encounter (Signed)
Pt needs a detailed written order from his doctor for a CPAP machine due to him switching DME Companies due to insurance

## 2022-11-24 DIAGNOSIS — G4733 Obstructive sleep apnea (adult) (pediatric): Secondary | ICD-10-CM | POA: Diagnosis not present

## 2022-11-28 DIAGNOSIS — J343 Hypertrophy of nasal turbinates: Secondary | ICD-10-CM | POA: Diagnosis not present

## 2022-12-01 ENCOUNTER — Encounter: Payer: Self-pay | Admitting: Internal Medicine

## 2022-12-01 ENCOUNTER — Ambulatory Visit (INDEPENDENT_AMBULATORY_CARE_PROVIDER_SITE_OTHER): Payer: Medicare Other | Admitting: Internal Medicine

## 2022-12-01 ENCOUNTER — Telehealth: Payer: Self-pay | Admitting: Internal Medicine

## 2022-12-01 VITALS — BP 92/60 | HR 55 | Temp 97.5°F | Ht 69.0 in | Wt 227.0 lb

## 2022-12-01 DIAGNOSIS — R112 Nausea with vomiting, unspecified: Secondary | ICD-10-CM | POA: Diagnosis not present

## 2022-12-01 DIAGNOSIS — K529 Noninfective gastroenteritis and colitis, unspecified: Secondary | ICD-10-CM

## 2022-12-01 DIAGNOSIS — I10 Essential (primary) hypertension: Secondary | ICD-10-CM | POA: Diagnosis not present

## 2022-12-01 MED ORDER — PANTOPRAZOLE SODIUM 40 MG PO TBEC: 40.0000 mg | DELAYED_RELEASE_TABLET | Freq: Every day | ORAL | 1 refills | Status: AC

## 2022-12-01 MED ORDER — CEFTRIAXONE SODIUM 500 MG IJ SOLR
500.0000 mg | Freq: Once | INTRAMUSCULAR | Status: AC
  Administered 2022-12-01: 500 mg via INTRAMUSCULAR

## 2022-12-01 MED ORDER — ONDANSETRON 4 MG PO TBDP: 4.0000 mg | ORAL_TABLET | Freq: Three times a day (TID) | ORAL | 0 refills | Status: AC | PRN

## 2022-12-01 MED ORDER — ONDANSETRON HCL 4 MG/2ML IJ SOLN
4.0000 mg | Freq: Once | INTRAMUSCULAR | Status: AC
  Administered 2022-12-01: 4 mg via INTRAMUSCULAR

## 2022-12-01 MED ORDER — DIPHENOXYLATE-ATROPINE 2.5-0.025 MG PO TABS
1.0000 | ORAL_TABLET | Freq: Four times a day (QID) | ORAL | 0 refills | Status: AC | PRN
Start: 2022-12-01 — End: ?

## 2022-12-01 NOTE — Telephone Encounter (Signed)
Fayrene Fearing from [facility] called regarding diphenoxylate-atropine (LOMOTIL) 2.5-0.025 MG tablet. He said since this is the first time the patient is taking this medication, they are only able to fill a 5 day supply. Best callback for Fayrene Fearing at [facility] is [phone].

## 2022-12-01 NOTE — Assessment & Plan Note (Addendum)
Acute, ?viral vs other Oral hydration Ceftriaxone (empiric) and Zofran injections given Obtain lab work including lipase etc.-see orders Go to ER if worse

## 2022-12-01 NOTE — Patient Instructions (Signed)
Go to ER if worse 

## 2022-12-01 NOTE — Progress Notes (Signed)
Subjective:  Patient ID: Jeffrey Carter, male    DOB: 09-12-1940  Age: 82 y.o. MRN: [id number]  CC: Medical Management of Chronic Issues (3 MNTH F/U, Abdominal pain x2 days a/w nausea and diarrhea )   HPI SHAFT TIA presents for nausea, abd pain x 2 d, vomited x 3 (dry heaves) C/o diarrhea   Outpatient Medications Prior to Visit  Medication Sig Dispense Refill   acetaminophen (TYLENOL) 325 MG tablet Take 2 tablets (650 mg total) by mouth every 8 (eight) hours. 30 tablet 0   allopurinol (ZYLOPRIM) 100 MG tablet Take 0.5 tablets (50 mg total) by mouth daily. 45 tablet 3   amLODipine (NORVASC) 10 MG tablet Take 1 tablet (10 mg total) by mouth daily. 90 tablet 3   cloNIDine (CATAPRES) 0.3 MG tablet Take 1 tablet by mouth twice daily 180 tablet 3   EQ ALLERGY RELIEF 10 MG tablet TAKE ONE TABLET BY MOUTH ONCE DAILY 90 tablet 3   finasteride (PROSCAR) 5 MG tablet Take 5 mg by mouth daily.     fluticasone (FLONASE) 50 MCG/ACT nasal spray Place 2 sprays into both nostrils daily. (Patient taking differently: Place 2 sprays into both nostrils daily as needed for allergies.) 16 g 2   furosemide (LASIX) 40 MG tablet TAKE 1 TABLET BY MOUTH ONCE DAILY.  ANNUAL APPOINTMENT DUE IN AUG WITH LABS.  MUST SEE PROVIDER FOR FUTURE REFILLS 90 tablet 0   HYDROcodone-acetaminophen (NORCO) 7.5-325 MG tablet Take 1 tablet by mouth every 6 (six) hours as needed for moderate pain. 60 tablet 0   losartan (COZAAR) 100 MG tablet Take 1 tablet (100 mg total) by mouth daily. 90 tablet 1   lovastatin (MEVACOR) 40 MG tablet TAKE 1 TABLET BY MOUTH ONCE DAILY IN THE EVENING AT  6  PM 90 tablet 1   Potassium Chloride ER 20 MEQ TBCR Take 1 tablet by mouth once daily 90 tablet 1   predniSONE (DELTASONE) 10 MG tablet TAKE 3 TABLETS BY MOUTH ON DAY 1, THEN 2 TABLETS ON DAY 2 THEN 1 TABLET ON DAY 3 AS NEEDED FOR GOUT ATTACK. TAKE AFTER MEALS 18 tablet 1   sildenafil (VIAGRA) 100 MG tablet Take 1 tablet (100 mg total) by  mouth as needed for erectile dysfunction. 12 tablet 5   tamsulosin (FLOMAX) 0.4 MG CAPS capsule TAKE 1 CAPSULE BY MOUTH  DAILY (Patient taking differently: Take 0.4 mg by mouth 2 (two) times daily.) 90 capsule 1   No facility-administered medications prior to visit.    ROS: Review of Systems  Constitutional:  Positive for fatigue. Negative for appetite change and unexpected weight change.  HENT:  Negative for congestion, nosebleeds, sinus pressure, sneezing, sore throat and trouble swallowing.   Eyes:  Negative for itching and visual disturbance.  Respiratory:  Negative for cough.   Cardiovascular:  Negative for chest pain, palpitations and leg swelling.  Gastrointestinal:  Positive for diarrhea and vomiting. Negative for abdominal distention, abdominal pain, blood in stool, constipation and nausea.  Genitourinary:  Negative for frequency and hematuria.  Musculoskeletal:  Negative for back pain, gait problem, joint swelling and neck pain.  Skin:  Negative for color change and rash.  Neurological:  Positive for weakness. Negative for dizziness, tremors and speech difficulty.  Psychiatric/Behavioral:  Negative for agitation, dysphoric mood and sleep disturbance. The patient is not nervous/anxious.     Objective:  BP 92/60 (BP Location: Left Arm, Patient Position: Sitting, Cuff Size: Normal)   Pulse (!) 55  Temp (!) 97.5 F (36.4 C) (Oral)   Ht 5\' 9"  (1.753 m)   Wt 227 lb (103 kg)   SpO2 98%   BMI 33.52 kg/m   BP Readings from Last 3 Encounters:  12/08/22 130/80  12/01/22 92/60  08/31/22 130/80    Wt Readings from Last 3 Encounters:  12/08/22 225 lb (102.1 kg)  12/01/22 227 lb (103 kg)  08/31/22 246 lb (111.6 kg)    Physical Exam Constitutional:      General: He is not in acute distress.    Appearance: He is well-developed. He is obese.     Comments: NAD  Eyes:     Conjunctiva/sclera: Conjunctivae normal.     Pupils: Pupils are equal, round, and reactive to light.   Neck:     Thyroid: No thyromegaly.     Vascular: No JVD.  Cardiovascular:     Rate and Rhythm: Normal rate and regular rhythm.     Heart sounds: Normal heart sounds. No murmur heard.    No friction rub. No gallop.  Pulmonary:     Effort: Pulmonary effort is normal. No respiratory distress.     Breath sounds: Normal breath sounds. No wheezing or rales.  Chest:     Chest wall: No tenderness.  Abdominal:     General: Bowel sounds are normal. There is no distension.     Palpations: Abdomen is soft. There is no mass.     Tenderness: There is no abdominal tenderness. There is no guarding or rebound.  Musculoskeletal:        General: No tenderness. Normal range of motion.     Cervical back: Normal range of motion.  Lymphadenopathy:     Cervical: No cervical adenopathy.  Skin:    General: Skin is warm and dry.     Findings: No rash.  Neurological:     Mental Status: He is alert and oriented to person, place, and time.     Cranial Nerves: No cranial nerve deficit.     Motor: No abnormal muscle tone.     Coordination: Coordination normal.     Gait: Gait normal.     Deep Tendon Reflexes: Reflexes are normal and symmetric.  Psychiatric:        Behavior: Behavior normal.        Thought Content: Thought content normal.        Judgment: Judgment normal.     Lab Results  Component Value Date   WBC 3.4 (L) 10/15/2019   HGB 13.2 10/15/2019   HCT 39.9 10/15/2019   PLT 161 10/15/2019   GLUCOSE 106 (H) 01/31/2022   CHOL 131 10/15/2019   TRIG 67 10/15/2019   HDL 43 10/15/2019   LDLCALC 74 10/15/2019   ALT 16 01/31/2022   AST 18 01/31/2022   NA 140 01/31/2022   K 3.4 (L) 01/31/2022   CL 103 01/31/2022   CREATININE 1.06 01/31/2022   BUN 11 01/31/2022   CO2 30 01/31/2022   TSH 2.26 10/15/2019   PSA 0.11 10/15/2019   INR 1.01 10/12/2014   HGBA1C 6.2 06/14/2021    DG Foot Complete Left  Result Date: 06/07/2019 CLINICAL DATA:  Left heel pain for 1 week common no known injury,  initial encounter EXAM: LEFT FOOT - COMPLETE 3+ VIEW COMPARISON:  None. FINDINGS: No acute fracture or dislocation is noted. Calcaneal spurs are seen. No soft tissue abnormality is noted. Mild hallux valgus deformity is seen. IMPRESSION: Degenerative changes without acute abnormality. Electronically Signed  By: Alcide Clever M.D.   On: 06/07/2019 23:03    Assessment & Plan:   Problem List Items Addressed This Visit     Essential hypertension    Hold blood pressure meds for 2 days      Gastroenteritis - Primary    Acute, ?viral vs other Oral hydration Ceftriaxone (empiric) and Zofran injections given Obtain lab work including lipase etc.-see orders Go to ER if worse      Relevant Orders   Comprehensive metabolic panel   CBC with Differential/Platelet   Lipase   Nausea and vomiting    Due to gastroenteritis.  Zofran injection given.  Zofran p.o. as needed  Go to ER if worse         Meds ordered this encounter  Medications   ondansetron (ZOFRAN-ODT) 4 MG disintegrating tablet    Sig: Take 1 tablet (4 mg total) by mouth every 8 (eight) hours as needed for nausea or vomiting.    Dispense:  20 tablet    Refill:  0   diphenoxylate-atropine (LOMOTIL) 2.5-0.025 MG tablet    Sig: Take 1-2 tablets by mouth 4 (four) times daily as needed for diarrhea or loose stools.    Dispense:  60 tablet    Refill:  0   pantoprazole (PROTONIX) 40 MG tablet    Sig: Take 1 tablet (40 mg total) by mouth daily.    Dispense:  30 tablet    Refill:  1   cefTRIAXone (ROCEPHIN) injection 500 mg   ondansetron (ZOFRAN) injection 4 mg      Follow-up: Return in about 1 week (around 12/08/2022) for a follow-up visit.  Sonda Primes, MD

## 2022-12-06 DIAGNOSIS — H3540 Unspecified peripheral retinal degeneration: Secondary | ICD-10-CM | POA: Diagnosis not present

## 2022-12-06 DIAGNOSIS — H35033 Hypertensive retinopathy, bilateral: Secondary | ICD-10-CM | POA: Diagnosis not present

## 2022-12-06 DIAGNOSIS — H26493 Other secondary cataract, bilateral: Secondary | ICD-10-CM | POA: Diagnosis not present

## 2022-12-06 DIAGNOSIS — I10 Essential (primary) hypertension: Secondary | ICD-10-CM | POA: Diagnosis not present

## 2022-12-06 DIAGNOSIS — H52223 Regular astigmatism, bilateral: Secondary | ICD-10-CM | POA: Diagnosis not present

## 2022-12-06 DIAGNOSIS — H524 Presbyopia: Secondary | ICD-10-CM | POA: Diagnosis not present

## 2022-12-06 NOTE — Telephone Encounter (Signed)
Okay. Thank you.

## 2022-12-08 ENCOUNTER — Ambulatory Visit: Payer: Medicare Other | Admitting: Internal Medicine

## 2022-12-08 ENCOUNTER — Encounter: Payer: Self-pay | Admitting: Internal Medicine

## 2022-12-08 VITALS — BP 130/80 | HR 82 | Temp 98.2°F | Ht 69.0 in | Wt 225.0 lb

## 2022-12-08 DIAGNOSIS — K529 Noninfective gastroenteritis and colitis, unspecified: Secondary | ICD-10-CM

## 2022-12-08 DIAGNOSIS — I10 Essential (primary) hypertension: Secondary | ICD-10-CM | POA: Diagnosis not present

## 2022-12-08 DIAGNOSIS — R634 Abnormal weight loss: Secondary | ICD-10-CM | POA: Insufficient documentation

## 2022-12-08 NOTE — Progress Notes (Signed)
Subjective:  Patient ID: Jeffrey Carter, male    DOB: 22-Nov-1940  Age: 82 y.o. MRN: [id number]  CC: Medical Management of Chronic Issues (1 week f/u)   HPI Jeffrey Carter presents for gastroenteritis f/u. He is doing well.  F/u HTN, wt loss (from 246 lbs due to poor appetite)  Outpatient Medications Prior to Visit  Medication Sig Dispense Refill   acetaminophen (TYLENOL) 325 MG tablet Take 2 tablets (650 mg total) by mouth every 8 (eight) hours. 30 tablet 0   allopurinol (ZYLOPRIM) 100 MG tablet Take 0.5 tablets (50 mg total) by mouth daily. 45 tablet 3   amLODipine (NORVASC) 10 MG tablet Take 1 tablet (10 mg total) by mouth daily. 90 tablet 3   cloNIDine (CATAPRES) 0.3 MG tablet Take 1 tablet by mouth twice daily 180 tablet 3   diphenoxylate-atropine (LOMOTIL) 2.5-0.025 MG tablet Take 1-2 tablets by mouth 4 (four) times daily as needed for diarrhea or loose stools. 60 tablet 0   EQ ALLERGY RELIEF 10 MG tablet TAKE ONE TABLET BY MOUTH ONCE DAILY 90 tablet 3   finasteride (PROSCAR) 5 MG tablet Take 5 mg by mouth daily.     fluticasone (FLONASE) 50 MCG/ACT nasal spray Place 2 sprays into both nostrils daily. (Patient taking differently: Place 2 sprays into both nostrils daily as needed for allergies.) 16 g 2   furosemide (LASIX) 40 MG tablet TAKE 1 TABLET BY MOUTH ONCE DAILY.  ANNUAL APPOINTMENT DUE IN AUG WITH LABS.  MUST SEE PROVIDER FOR FUTURE REFILLS 90 tablet 0   HYDROcodone-acetaminophen (NORCO) 7.5-325 MG tablet Take 1 tablet by mouth every 6 (six) hours as needed for moderate pain. 60 tablet 0   losartan (COZAAR) 100 MG tablet Take 1 tablet (100 mg total) by mouth daily. 90 tablet 1   lovastatin (MEVACOR) 40 MG tablet TAKE 1 TABLET BY MOUTH ONCE DAILY IN THE EVENING AT  6  PM 90 tablet 1   ondansetron (ZOFRAN-ODT) 4 MG disintegrating tablet Take 1 tablet (4 mg total) by mouth every 8 (eight) hours as needed for nausea or vomiting. 20 tablet 0   pantoprazole (PROTONIX) 40 MG  tablet Take 1 tablet (40 mg total) by mouth daily. 30 tablet 1   Potassium Chloride ER 20 MEQ TBCR Take 1 tablet by mouth once daily 90 tablet 1   predniSONE (DELTASONE) 10 MG tablet TAKE 3 TABLETS BY MOUTH ON DAY 1, THEN 2 TABLETS ON DAY 2 THEN 1 TABLET ON DAY 3 AS NEEDED FOR GOUT ATTACK. TAKE AFTER MEALS 18 tablet 1   sildenafil (VIAGRA) 100 MG tablet Take 1 tablet (100 mg total) by mouth as needed for erectile dysfunction. 12 tablet 5   tamsulosin (FLOMAX) 0.4 MG CAPS capsule TAKE 1 CAPSULE BY MOUTH  DAILY (Patient taking differently: Take 0.4 mg by mouth 2 (two) times daily.) 90 capsule 1   No facility-administered medications prior to visit.    ROS: Review of Systems  Constitutional:  Negative for appetite change, fatigue and unexpected weight change.  HENT:  Negative for congestion, nosebleeds, sneezing, sore throat and trouble swallowing.   Eyes:  Negative for itching and visual disturbance.  Respiratory:  Negative for cough.   Cardiovascular:  Negative for chest pain, palpitations and leg swelling.  Gastrointestinal:  Negative for abdominal distention, blood in stool, diarrhea and nausea.  Genitourinary:  Negative for frequency and hematuria.  Musculoskeletal:  Negative for back pain, gait problem, joint swelling and neck pain.  Skin:  Negative  for rash.  Neurological:  Negative for dizziness, tremors, speech difficulty and weakness.  Psychiatric/Behavioral:  Negative for agitation, dysphoric mood and sleep disturbance. The patient is not nervous/anxious.     Objective:  BP 130/80 (BP Location: Right Arm, Patient Position: Sitting, Cuff Size: Normal)   Pulse 82   Temp 98.2 F (36.8 C) (Oral)   Ht 5\' 9"  (1.753 m)   Wt 225 lb (102.1 kg)   SpO2 95%   BMI 33.23 kg/m   BP Readings from Last 3 Encounters:  12/08/22 130/80  12/01/22 92/60  08/31/22 130/80    Wt Readings from Last 3 Encounters:  12/08/22 225 lb (102.1 kg)  12/01/22 227 lb (103 kg)  08/31/22 246 lb (111.6  kg)    Physical Exam Constitutional:      General: He is not in acute distress.    Appearance: He is well-developed.     Comments: NAD  Eyes:     Conjunctiva/sclera: Conjunctivae normal.     Pupils: Pupils are equal, round, and reactive to light.  Neck:     Thyroid: No thyromegaly.     Vascular: No JVD.  Cardiovascular:     Rate and Rhythm: Normal rate and regular rhythm.     Heart sounds: Normal heart sounds. No murmur heard.    No friction rub. No gallop.  Pulmonary:     Effort: Pulmonary effort is normal. No respiratory distress.     Breath sounds: Normal breath sounds. No wheezing or rales.  Chest:     Chest wall: No tenderness.  Abdominal:     General: Bowel sounds are normal. There is no distension.     Palpations: Abdomen is soft. There is no mass.     Tenderness: There is no abdominal tenderness. There is no guarding or rebound.  Musculoskeletal:        General: No tenderness. Normal range of motion.     Cervical back: Normal range of motion.  Lymphadenopathy:     Cervical: No cervical adenopathy.  Skin:    General: Skin is warm and dry.     Findings: No rash.  Neurological:     Mental Status: He is alert and oriented to person, place, and time.     Cranial Nerves: No cranial nerve deficit.     Motor: No abnormal muscle tone.     Coordination: Coordination normal.     Gait: Gait normal.     Deep Tendon Reflexes: Reflexes are normal and symmetric.  Psychiatric:        Behavior: Behavior normal.        Thought Content: Thought content normal.        Judgment: Judgment normal.     Lab Results  Component Value Date   WBC 3.4 (L) 10/15/2019   HGB 13.2 10/15/2019   HCT 39.9 10/15/2019   PLT 161 10/15/2019   GLUCOSE 106 (H) 01/31/2022   CHOL 131 10/15/2019   TRIG 67 10/15/2019   HDL 43 10/15/2019   LDLCALC 74 10/15/2019   ALT 16 01/31/2022   AST 18 01/31/2022   NA 140 01/31/2022   K 3.4 (L) 01/31/2022   CL 103 01/31/2022   CREATININE 1.06 01/31/2022    BUN 11 01/31/2022   CO2 30 01/31/2022   TSH 2.26 10/15/2019   PSA 0.11 10/15/2019   INR 1.01 10/12/2014   HGBA1C 6.2 06/14/2021    DG Foot Complete Left  Result Date: 06/07/2019 CLINICAL DATA:  Left heel pain for 1 week  common no known injury, initial encounter EXAM: LEFT FOOT - COMPLETE 3+ VIEW COMPARISON:  None. FINDINGS: No acute fracture or dislocation is noted. Calcaneal spurs are seen. No soft tissue abnormality is noted. Mild hallux valgus deformity is seen. IMPRESSION: Degenerative changes without acute abnormality. Electronically Signed   By: Alcide Clever M.D.   On: 06/07/2019 23:03    Assessment & Plan:   Problem List Items Addressed This Visit     Essential hypertension    OK to re-start BP meds      Gastroenteritis    11/2022 Resolved      Weight loss - Primary     Recent wt loss (from 246 lbs due to poor appetite) - "I can eat now". Resolving.         No orders of the defined types were placed in this encounter.     Follow-up: Return in about 3 months (around 03/10/2023) for a follow-up visit.  Sonda Primes, MD

## 2022-12-08 NOTE — Assessment & Plan Note (Addendum)
11/2022 Resolved

## 2022-12-08 NOTE — Assessment & Plan Note (Signed)
Recent wt loss (from 246 lbs due to poor appetite) - "I can eat now". Resolving.

## 2022-12-08 NOTE — Assessment & Plan Note (Signed)
OK to re-start BP meds

## 2022-12-25 DIAGNOSIS — R112 Nausea with vomiting, unspecified: Secondary | ICD-10-CM | POA: Insufficient documentation

## 2022-12-25 NOTE — Assessment & Plan Note (Signed)
Due to gastroenteritis.  Zofran injection given.  Zofran p.o. as needed  Go to ER if worse

## 2022-12-25 NOTE — Assessment & Plan Note (Signed)
Hold blood pressure meds for 2 days

## 2022-12-28 ENCOUNTER — Encounter: Payer: Self-pay | Admitting: Internal Medicine

## 2022-12-28 ENCOUNTER — Ambulatory Visit: Payer: Medicare Other | Admitting: Internal Medicine

## 2022-12-28 VITALS — BP 138/82 | HR 83 | Temp 98.2°F | Ht 69.0 in | Wt 236.0 lb

## 2022-12-28 DIAGNOSIS — M109 Gout, unspecified: Secondary | ICD-10-CM | POA: Diagnosis not present

## 2022-12-28 DIAGNOSIS — I10 Essential (primary) hypertension: Secondary | ICD-10-CM

## 2022-12-28 DIAGNOSIS — R600 Localized edema: Secondary | ICD-10-CM

## 2022-12-28 MED ORDER — PREDNISONE 10 MG PO TABS: ORAL_TABLET | ORAL | 1 refills | Status: DC

## 2022-12-28 NOTE — Assessment & Plan Note (Signed)
New: post-Thanksgiving Use Medrol pack Norco prn   Potential benefits of a short term opioids use as well as potential risks (i.e. addiction risk, apnea etc) and complications (i.e. Somnolence, constipation and others) were explained to the patient and were aknowledged.

## 2022-12-28 NOTE — Assessment & Plan Note (Signed)
Will treat gout

## 2022-12-28 NOTE — Assessment & Plan Note (Signed)
Cont w/blood pressure meds -  Amlodipine, Catapress, Furosemide, Losartan

## 2022-12-28 NOTE — Progress Notes (Signed)
Subjective:  Patient ID: Jeffrey Carter, male    DOB: 05/15/1940  Age: 82 y.o. MRN: [id number]  CC: Pain (Pt states he is having pain and swelling in his left foot)   HPI Jeffrey Carter presents for L foot and ankle pain and swelling starting Sunday  Outpatient Medications Prior to Visit  Medication Sig Dispense Refill   acetaminophen (TYLENOL) 325 MG tablet Take 2 tablets (650 mg total) by mouth every 8 (eight) hours. 30 tablet 0   allopurinol (ZYLOPRIM) 100 MG tablet Take 0.5 tablets (50 mg total) by mouth daily. 45 tablet 3   amLODipine (NORVASC) 10 MG tablet Take 1 tablet (10 mg total) by mouth daily. 90 tablet 3   cloNIDine (CATAPRES) 0.3 MG tablet Take 1 tablet by mouth twice daily 180 tablet 3   diphenoxylate-atropine (LOMOTIL) 2.5-0.025 MG tablet Take 1-2 tablets by mouth 4 (four) times daily as needed for diarrhea or loose stools. 60 tablet 0   EQ ALLERGY RELIEF 10 MG tablet TAKE ONE TABLET BY MOUTH ONCE DAILY 90 tablet 3   finasteride (PROSCAR) 5 MG tablet Take 5 mg by mouth daily.     fluticasone (FLONASE) 50 MCG/ACT nasal spray Place 2 sprays into both nostrils daily. (Patient taking differently: Place 2 sprays into both nostrils daily as needed for allergies.) 16 g 2   furosemide (LASIX) 40 MG tablet TAKE 1 TABLET BY MOUTH ONCE DAILY.  ANNUAL APPOINTMENT DUE IN AUG WITH LABS.  MUST SEE PROVIDER FOR FUTURE REFILLS 90 tablet 0   HYDROcodone-acetaminophen (NORCO) 7.5-325 MG tablet Take 1 tablet by mouth every 6 (six) hours as needed for moderate pain. 60 tablet 0   losartan (COZAAR) 100 MG tablet Take 1 tablet (100 mg total) by mouth daily. 90 tablet 1   lovastatin (MEVACOR) 40 MG tablet TAKE 1 TABLET BY MOUTH ONCE DAILY IN THE EVENING AT  6  PM 90 tablet 1   ondansetron (ZOFRAN-ODT) 4 MG disintegrating tablet Take 1 tablet (4 mg total) by mouth every 8 (eight) hours as needed for nausea or vomiting. 20 tablet 0   pantoprazole (PROTONIX) 40 MG tablet Take 1 tablet (40 mg  total) by mouth daily. 30 tablet 1   Potassium Chloride ER 20 MEQ TBCR Take 1 tablet by mouth once daily 90 tablet 1   sildenafil (VIAGRA) 100 MG tablet Take 1 tablet (100 mg total) by mouth as needed for erectile dysfunction. 12 tablet 5   tamsulosin (FLOMAX) 0.4 MG CAPS capsule TAKE 1 CAPSULE BY MOUTH  DAILY (Patient taking differently: Take 0.4 mg by mouth 2 (two) times daily.) 90 capsule 1   predniSONE (DELTASONE) 10 MG tablet TAKE 3 TABLETS BY MOUTH ON DAY 1, THEN 2 TABLETS ON DAY 2 THEN 1 TABLET ON DAY 3 AS NEEDED FOR GOUT ATTACK. TAKE AFTER MEALS 18 tablet 1   No facility-administered medications prior to visit.    ROS: Review of Systems  Constitutional:  Negative for appetite change, fatigue and unexpected weight change.  HENT:  Negative for congestion, nosebleeds, sneezing, sore throat and trouble swallowing.   Eyes:  Negative for itching and visual disturbance.  Respiratory:  Negative for cough.   Cardiovascular:  Positive for leg swelling. Negative for chest pain and palpitations.  Gastrointestinal:  Negative for abdominal distention, blood in stool, diarrhea and nausea.  Genitourinary:  Negative for frequency and hematuria.  Musculoskeletal:  Positive for arthralgias and joint swelling. Negative for back pain, gait problem and neck pain.  Skin:  Negative for rash.  Neurological:  Negative for dizziness, tremors, speech difficulty and weakness.  Psychiatric/Behavioral:  Negative for agitation, dysphoric mood and sleep disturbance. The patient is not nervous/anxious.     Objective:  BP 138/82 (BP Location: Left Arm, Patient Position: Sitting, Cuff Size: Normal)   Pulse 83   Temp 98.2 F (36.8 C) (Oral)   Ht 5\' 9"  (1.753 m)   Wt 236 lb (107 kg)   SpO2 96%   BMI 34.85 kg/m   BP Readings from Last 3 Encounters:  12/28/22 138/82  12/08/22 130/80  12/01/22 92/60    Wt Readings from Last 3 Encounters:  12/28/22 236 lb (107 kg)  12/08/22 225 lb (102.1 kg)  12/01/22 227  lb (103 kg)    Physical Exam Constitutional:      General: He is not in acute distress.    Appearance: He is well-developed. He is obese.     Comments: NAD  Eyes:     Conjunctiva/sclera: Conjunctivae normal.     Pupils: Pupils are equal, round, and reactive to light.  Neck:     Thyroid: No thyromegaly.     Vascular: No JVD.  Cardiovascular:     Rate and Rhythm: Normal rate and regular rhythm.     Heart sounds: Normal heart sounds. No murmur heard.    No friction rub. No gallop.  Pulmonary:     Effort: Pulmonary effort is normal. No respiratory distress.     Breath sounds: Normal breath sounds. No wheezing or rales.  Chest:     Chest wall: No tenderness.  Abdominal:     General: Bowel sounds are normal. There is no distension.     Palpations: Abdomen is soft. There is no mass.     Tenderness: There is no abdominal tenderness. There is no guarding or rebound.  Musculoskeletal:        General: Tenderness present. Normal range of motion.     Cervical back: Normal range of motion.     Right lower leg: Edema present.     Left lower leg: Edema present.  Lymphadenopathy:     Cervical: No cervical adenopathy.  Skin:    General: Skin is warm and dry.     Findings: No rash.  Neurological:     Mental Status: He is alert and oriented to person, place, and time.     Cranial Nerves: No cranial nerve deficit.     Motor: No abnormal muscle tone.     Coordination: Coordination normal.     Gait: Gait normal.     Deep Tendon Reflexes: Reflexes are normal and symmetric.  Psychiatric:        Behavior: Behavior normal.        Thought Content: Thought content normal.        Judgment: Judgment normal.    L foot w/pain, swollen B shins w/trace edema   Lab Results  Component Value Date   WBC 3.4 (L) 10/15/2019   HGB 13.2 10/15/2019   HCT 39.9 10/15/2019   PLT 161 10/15/2019   GLUCOSE 106 (H) 01/31/2022   CHOL 131 10/15/2019   TRIG 67 10/15/2019   HDL 43 10/15/2019   LDLCALC 74  10/15/2019   ALT 16 01/31/2022   AST 18 01/31/2022   NA 140 01/31/2022   K 3.4 (L) 01/31/2022   CL 103 01/31/2022   CREATININE 1.06 01/31/2022   BUN 11 01/31/2022   CO2 30 01/31/2022   TSH 2.26 10/15/2019   PSA 0.11  10/15/2019   INR 1.01 10/12/2014   HGBA1C 6.2 06/14/2021    DG Foot Complete Left  Result Date: 06/07/2019 CLINICAL DATA:  Left heel pain for 1 week common no known injury, initial encounter EXAM: LEFT FOOT - COMPLETE 3+ VIEW COMPARISON:  None. FINDINGS: No acute fracture or dislocation is noted. Calcaneal spurs are seen. No soft tissue abnormality is noted. Mild hallux valgus deformity is seen. IMPRESSION: Degenerative changes without acute abnormality. Electronically Signed   By: Alcide Clever M.D.   On: 06/07/2019 23:03    Assessment & Plan:   Problem List Items Addressed This Visit     Essential hypertension    Cont w/blood pressure meds -  Amlodipine, Catapress, Furosemide, Losartan      Gout of big toe - Primary    New: post-Thanksgiving Use Medrol pack Norco prn   Potential benefits of a short term opioids use as well as potential risks (i.e. addiction risk, apnea etc) and complications (i.e. Somnolence, constipation and others) were explained to the patient and were aknowledged.      Relevant Medications   predniSONE (DELTASONE) 10 MG tablet   Edema    Will treat gout         Meds ordered this encounter  Medications   predniSONE (DELTASONE) 10 MG tablet    Sig: TAKE 3 TABLETS BY MOUTH ON DAY 1, THEN 2 TABLETS ON DAY 2 THEN 1 TABLET ON DAY 3 AS NEEDED FOR GOUT ATTACK. TAKE AFTER MEALS    Dispense:  18 tablet    Refill:  1      Follow-up: Return in about 3 months (around 03/28/2023) for a follow-up visit.  Sonda Primes, MD

## 2022-12-29 ENCOUNTER — Other Ambulatory Visit (HOSPITAL_BASED_OUTPATIENT_CLINIC_OR_DEPARTMENT_OTHER): Payer: Self-pay

## 2022-12-29 ENCOUNTER — Telehealth: Payer: Self-pay | Admitting: Pulmonary Disease

## 2022-12-29 NOTE — Telephone Encounter (Signed)
Patient unable to tell [facility] what is needed. They called [place] for information. I provided all information I had.   Device AirSense 10 AutoSet Serial number [id number] Added 02/21/2020 Status No changes pending   Details Mode CPAP Set pressure (cmH2O) 12.0 EPR Fulltime EPR level (cmH2O) 1   Ramp enable Auto Ramp time (min) 20 Start pressure (cmH2O) 5.0 Edit device settings Climate control Humidifier level Auto Level 4 Edit climate settings

## 2022-12-29 NOTE — Telephone Encounter (Signed)
Nate is returning phone call. Nate phone number is [phone].

## 2022-12-29 NOTE — Telephone Encounter (Signed)
Nate would like to know what CPAP supplies the patient needs. Nate phone number is [phone].

## 2023-01-02 ENCOUNTER — Other Ambulatory Visit: Payer: Self-pay | Admitting: Internal Medicine

## 2023-01-03 ENCOUNTER — Telehealth: Payer: Self-pay | Admitting: Internal Medicine

## 2023-01-03 NOTE — Telephone Encounter (Signed)
error 

## 2023-01-09 NOTE — Telephone Encounter (Signed)
Patient says he did receive some supplies and is good for now. He will reach out when more supplies needed. He was confused really as to what supplies he needed. NFN

## 2023-01-13 DIAGNOSIS — J343 Hypertrophy of nasal turbinates: Secondary | ICD-10-CM | POA: Diagnosis not present

## 2023-01-19 NOTE — Telephone Encounter (Signed)
I spoke with Jeffrey Carter with [employer] [employer] and she stated [facility] will be sending the patient his Cpap Supplies nothing else needed. [facility] was trying to find out where the patient got his original Cpap from which was [facility]

## 2023-03-04 ENCOUNTER — Encounter (HOSPITAL_COMMUNITY): Payer: Self-pay

## 2023-03-04 ENCOUNTER — Other Ambulatory Visit: Payer: Self-pay

## 2023-03-04 ENCOUNTER — Emergency Department (HOSPITAL_COMMUNITY)
Admission: EM | Admit: 2023-03-04 | Discharge: 2023-03-04 | Disposition: A | Discharge status: Home / Self Care | Payer: Medicare Other | Attending: Emergency Medicine | Admitting: Emergency Medicine

## 2023-03-04 DIAGNOSIS — R102 Pelvic and perineal pain: Secondary | ICD-10-CM | POA: Diagnosis not present

## 2023-03-04 DIAGNOSIS — E669 Obesity, unspecified: Secondary | ICD-10-CM | POA: Insufficient documentation

## 2023-03-04 DIAGNOSIS — E876 Hypokalemia: Secondary | ICD-10-CM | POA: Diagnosis not present

## 2023-03-04 DIAGNOSIS — Z6834 Body mass index (BMI) 34.0-34.9, adult: Secondary | ICD-10-CM | POA: Insufficient documentation

## 2023-03-04 DIAGNOSIS — R39198 Other difficulties with micturition: Secondary | ICD-10-CM | POA: Diagnosis not present

## 2023-03-04 DIAGNOSIS — Z79899 Other long term (current) drug therapy: Secondary | ICD-10-CM | POA: Insufficient documentation

## 2023-03-04 DIAGNOSIS — I10 Essential (primary) hypertension: Secondary | ICD-10-CM | POA: Diagnosis not present

## 2023-03-04 LAB — URINALYSIS, ROUTINE W REFLEX MICROSCOPIC
Bacteria, UA: NONE SEEN
Bilirubin Urine: NEGATIVE
Glucose, UA: NEGATIVE mg/dL
Ketones, ur: NEGATIVE mg/dL
Leukocytes,Ua: NEGATIVE
Nitrite: NEGATIVE
Protein, ur: NEGATIVE mg/dL
Specific Gravity, Urine: 1.004 — ABNORMAL LOW (ref 1.005–1.030)
pH: 7 (ref 5.0–8.0)

## 2023-03-04 LAB — COMPREHENSIVE METABOLIC PANEL
ALT: 9 U/L (ref 0–44)
AST: 15 U/L (ref 15–41)
Albumin: 3.6 g/dL (ref 3.5–5.0)
Alkaline Phosphatase: 96 U/L (ref 38–126)
Anion gap: 11 (ref 5–15)
BUN: 14 mg/dL (ref 8–23)
CO2: 24 mmol/L (ref 22–32)
Calcium: 8.8 mg/dL — ABNORMAL LOW (ref 8.9–10.3)
Chloride: 106 mmol/L (ref 98–111)
Creatinine, Ser: 1.06 mg/dL (ref 0.61–1.24)
GFR, Estimated: >60 mL/min (ref >60)
Glucose, Bld: 115 mg/dL — ABNORMAL HIGH (ref 70–99)
Potassium: 3 mmol/L — ABNORMAL LOW (ref 3.5–5.1)
Sodium: 141 mmol/L (ref 135–145)
Total Bilirubin: 0.6 mg/dL (ref 0.0–1.2)
Total Protein: 6.7 g/dL (ref 6.5–8.1)

## 2023-03-04 LAB — CBC WITH DIFFERENTIAL/PLATELET
Abs Immature Granulocytes: 0.01 10*3/uL (ref 0.00–0.07)
Basophils Absolute: 0 10*3/uL (ref 0.0–0.1)
Basophils Relative: 0 %
Eosinophils Absolute: 0.1 10*3/uL (ref 0.0–0.5)
Eosinophils Relative: 2 %
HCT: 35.3 % — ABNORMAL LOW (ref 39.0–52.0)
Hemoglobin: 11.8 g/dL — ABNORMAL LOW (ref 13.0–17.0)
Immature Granulocytes: 0 %
Lymphocytes Relative: 20 %
Lymphs Abs: 1 10*3/uL (ref 0.7–4.0)
MCH: 27.4 pg (ref 26.0–34.0)
MCHC: 33.4 g/dL (ref 30.0–36.0)
MCV: 81.9 fL (ref 80.0–100.0)
Monocytes Absolute: 0.6 10*3/uL (ref 0.1–1.0)
Monocytes Relative: 12 %
Neutro Abs: 3.2 10*3/uL (ref 1.7–7.7)
Neutrophils Relative %: 66 %
Platelets: 231 10*3/uL (ref 150–400)
RBC: 4.31 MIL/uL (ref 4.22–5.81)
RDW: 15.3 % (ref 11.5–15.5)
WBC: 4.9 10*3/uL (ref 4.0–10.5)
nRBC: 0 % (ref 0.0–0.2)

## 2023-03-04 MED ORDER — POTASSIUM CHLORIDE CRYS ER 20 MEQ PO TBCR
40.0000 meq | EXTENDED_RELEASE_TABLET | Freq: Once | ORAL | Status: AC
  Administered 2023-03-04: 40 meq via ORAL
  Filled 2023-03-04: qty 2

## 2023-03-04 NOTE — ED Provider Notes (Signed)
 [place] EMERGENCY DEPARTMENT AT [facility] Provider Note   CSN: [id number] Arrival date & time: 03/04/23  1055     History  Chief Complaint  Patient presents with   Urinary Retention    Jeffrey Carter is a 83 y.o. male.  Pt is a 83 yo male with pmhx significant for htn, arthritis, hld, bph, gout, prostate cancer s/p radiation.  Pt presents to the ED because he feels like he can't urinate.  Pt said he called his urologist and they told him to come to the ED.       Home Medications Prior to Admission medications   Medication Sig Start Date End Date Taking? Authorizing Provider  acetaminophen  (TYLENOL ) 325 MG tablet Take 2 tablets (650 mg total) by mouth every 8 (eight) hours. 08/31/16   Caccavale, Sophia, PA-C  allopurinol  (ZYLOPRIM ) 100 MG tablet Take 0.5 tablets (50 mg total) by mouth daily. 08/31/22   Plotnikov, Karlynn GAILS, MD  amLODipine  (NORVASC ) 10 MG tablet Take 1 tablet (10 mg total) by mouth daily. 09/28/22   Plotnikov, Karlynn GAILS, MD  cloNIDine  (CATAPRES ) 0.3 MG tablet Take 1 tablet by mouth twice daily 03/28/22   Plotnikov, Aleksei V, MD  diphenoxylate -atropine  (LOMOTIL ) 2.5-0.025 MG tablet Take 1-2 tablets by mouth 4 (four) times daily as needed for diarrhea or loose stools. 12/01/22   Plotnikov, Aleksei V, MD  EQ ALLERGY RELIEF 10 MG tablet TAKE ONE TABLET BY MOUTH ONCE DAILY 12/18/16   Plotnikov, Aleksei V, MD  finasteride  (PROSCAR ) 5 MG tablet Take 5 mg by mouth daily. 07/29/21   [provider]  fluticasone  (FLONASE ) 50 MCG/ACT nasal spray Place 2 sprays into both nostrils daily. Patient taking differently: Place 2 sprays into both nostrils daily as needed for allergies. 03/11/15   Sood, Vineet, MD  furosemide  (LASIX ) 40 MG tablet TAKE 1 TABLET BY MOUTH ONCE DAILY . APPOINTMENT REQUIRED FOR FUTURE REFILLS WITH  LABS 01/02/23   Plotnikov, Aleksei V, MD  HYDROcodone -acetaminophen  (NORCO) 7.5-325 MG tablet Take 1 tablet by mouth every 6 (six) hours as needed  for moderate pain. 05/31/22   Plotnikov, Karlynn GAILS, MD  losartan  (COZAAR ) 100 MG tablet Take 1 tablet (100 mg total) by mouth daily. 09/28/22   Plotnikov, Aleksei V, MD  lovastatin  (MEVACOR ) 40 MG tablet TAKE 1 TABLET BY MOUTH ONCE DAILY IN THE EVENING AT  6  PM 09/28/22   Plotnikov, Aleksei V, MD  ondansetron  (ZOFRAN -ODT) 4 MG disintegrating tablet Take 1 tablet (4 mg total) by mouth every 8 (eight) hours as needed for nausea or vomiting. 12/01/22   Plotnikov, Karlynn GAILS, MD  pantoprazole  (PROTONIX ) 40 MG tablet Take 1 tablet (40 mg total) by mouth daily. 12/01/22 12/01/23  Plotnikov, Karlynn GAILS, MD  Potassium Chloride  ER 20 MEQ TBCR Take 1 tablet by mouth once daily 10/23/22   Plotnikov, Aleksei V, MD  predniSONE  (DELTASONE ) 10 MG tablet TAKE 3 TABLETS BY MOUTH ON DAY 1, THEN 2 TABLETS ON DAY 2 THEN 1 TABLET ON DAY 3 AS NEEDED FOR GOUT ATTACK. TAKE AFTER MEALS 12/28/22   Plotnikov, Karlynn GAILS, MD  sildenafil  (VIAGRA ) 100 MG tablet Take 1 tablet (100 mg total) by mouth as needed for erectile dysfunction. 01/11/16   Plotnikov, Aleksei V, MD  tamsulosin  (FLOMAX ) 0.4 MG CAPS capsule TAKE 1 CAPSULE BY MOUTH  DAILY Patient taking differently: Take 0.4 mg by mouth 2 (two) times daily. 01/30/18   Plotnikov, Karlynn GAILS, MD      Allergies  Percocet [oxycodone -acetaminophen ]    Review of Systems   Review of Systems  Genitourinary:  Positive for difficulty urinating and dysuria.  All other systems reviewed and are negative.   Physical Exam Updated Vital Signs BP 119/85   Pulse 95   Temp 98.1 F (36.7 C) (Oral)   Resp 18   Ht 5' 9 (1.753 m)   Wt 107 kg   SpO2 100%   BMI 34.84 kg/m  Physical Exam Vitals and nursing note reviewed.  Constitutional:      Appearance: Normal appearance. He is obese.  HENT:     Head: Normocephalic and atraumatic.     Right Ear: External ear normal.     Left Ear: External ear normal.     Nose: Nose normal.     Mouth/Throat:     Mouth: Mucous membranes are moist.      Pharynx: Oropharynx is clear.  Eyes:     Extraocular Movements: Extraocular movements intact.     Conjunctiva/sclera: Conjunctivae normal.     Pupils: Pupils are equal, round, and reactive to light.  Cardiovascular:     Rate and Rhythm: Normal rate and regular rhythm.     Pulses: Normal pulses.     Heart sounds: Normal heart sounds.  Pulmonary:     Effort: Pulmonary effort is normal.     Breath sounds: Normal breath sounds.  Abdominal:     General: Abdomen is flat. Bowel sounds are normal.     Palpations: Abdomen is soft.     Tenderness: There is abdominal tenderness in the suprapubic area.  Musculoskeletal:        General: Normal range of motion.     Cervical back: Normal range of motion and neck supple.  Skin:    General: Skin is warm.     Capillary Refill: Capillary refill takes less than 2 seconds.  Neurological:     General: No focal deficit present.     Mental Status: He is alert and oriented to person, place, and time.  Psychiatric:        Mood and Affect: Mood normal.        Behavior: Behavior normal.     ED Results / Procedures / Treatments   Labs (all labs ordered are listed, but only abnormal results are displayed) Labs Reviewed  CBC WITH DIFFERENTIAL/PLATELET - Abnormal; Notable for the following components:      Result Value   Hemoglobin 11.8 (*)    HCT 35.3 (*)    All other components within normal limits  COMPREHENSIVE METABOLIC PANEL - Abnormal; Notable for the following components:   Potassium 3.0 (*)    Glucose, Bld 115 (*)    Calcium 8.8 (*)    All other components within normal limits  URINALYSIS, ROUTINE W REFLEX MICROSCOPIC - Abnormal; Notable for the following components:   Color, Urine COLORLESS (*)    Specific Gravity, Urine 1.004 (*)    Hgb urine dipstick SMALL (*)    All other components within normal limits  URINE CULTURE    EKG None  Radiology No results found.  Procedures Procedures    Medications Ordered in ED Medications   potassium chloride  SA (KLOR-CON  M) CR tablet 40 mEq (has no administration in time range)    ED Course/ Medical Decision Making/ A&P                                 Medical  Decision Making Amount and/or Complexity of Data Reviewed Labs: ordered.   This patient presents to the ED for concern of difficulty urinating, this involves an extensive number of treatment options, and is a complaint that carries with it a high risk of complications and morbidity.  The differential diagnosis includes uti, urinary retention   Co morbidities that complicate the patient evaluation  htn, arthritis, hld, bph, gout, prostate cancer s/p radiation   Additional history obtained:  Additional history obtained from epic chart review  Lab Tests:  I Ordered, and personally interpreted labs.  The pertinent results include:  cmp with k low at 3.0; ua nl, cbc nl other than hgb sl low at 11.8 (13.2 in September of 2021; no recent cbc)   Medicines ordered and prescription drug management:  I have reviewed the patients home medicines and have made adjustments as needed  Problem List / ED Course:  Urinary retention:  pt initially had 200 cc post void in bladder.  Nurse attempted foley, but was unable to place.  After this intervention, he was able to urinate 500 cc and had 80cc of urine in bladder.  He feels better now.  He has flomax  to take at home.  He is told that we can avoid a foley now, but if he has trouble urinating again, he needs to come back.  He wants to avoid foley if at all possible.  Pt is to f/u with urology.  Return if worse. Hypokalemia:  pt given kdur in ED.  This is likely due to lasix  use.  Pt told to continue taking kdur at home and get labs rechecked by pcp.   Reevaluation:  After the interventions noted above, I reevaluated the patient and found that they have :improved   Social Determinants of Health:  Lives at home   Dispostion:  After consideration of the diagnostic  results and the patients response to treatment, I feel that the patent would benefit from discharge with outpatient f/u.          Final Clinical Impression(s) / ED Diagnoses Final diagnoses:  Difficulty urinating  Hypokalemia    Rx / DC Orders ED Discharge Orders     None         Dean Clarity, MD 03/04/23 1401

## 2023-03-04 NOTE — ED Notes (Signed)
 2 unsuccessful attempts at placing Foley catheter

## 2023-03-04 NOTE — Discharge Instructions (Addendum)
 Your potassium is a little low today.  Make sure you are taking your potassium pill.  Also eat foods high in potassium (vegetables, whole grains).  You will need to your K rechecked by your doctor.

## 2023-03-04 NOTE — ED Provider Triage Note (Signed)
 Emergency Medicine Provider Triage Evaluation Note  Jeffrey Carter , a 83 y.o. male  was evaluated in triage.  Pt complains of difficulty urinating that started this morning.  States he was able to get a small amount of output this morning, but did not feel like he completely emptied.  He called his urologist which recommended he come to the ER.  Reports mild abdominal pain.  Denies any fever or chills.  Reports he has had difficulties with this in the past.  Review of Systems  Positive: As above Negative: As above  Physical Exam  There were no vitals taken for this visit. Gen:   Awake, no distress   Resp:  Normal effort  MSK:   Moves extremities without difficulty    Medical Decision Making  Medically screening exam initiated at 11:07 AM.  Appropriate orders placed.  Jeffrey Carter was informed that the remainder of the evaluation will be completed by another provider, this initial triage assessment does not replace that evaluation, and the importance of remaining in the ED until their evaluation is complete.  Bladder scan performed which showed of urine even after attempting to use restroom. Patient uncomfortable not being able to completely empty. Foley catheter placed for acute urinary retention.   Veta Palma, PA-C 03/04/23 1121

## 2023-03-04 NOTE — ED Triage Notes (Signed)
 Pt reports with urinary retention since this morning. Pt states that he urinated a little bit and that was it. Pt complains of lower abdominal pain.

## 2023-03-05 LAB — URINE CULTURE: Culture: NO GROWTH

## 2023-03-08 ENCOUNTER — Ambulatory Visit: Payer: Medicare Other | Admitting: Internal Medicine

## 2023-03-14 ENCOUNTER — Telehealth: Payer: Self-pay | Admitting: Internal Medicine

## 2023-03-14 NOTE — Telephone Encounter (Signed)
 Pt had to r/s appointment on 2.19.25 due to weather, he has a new appointment on 3.10.25 but is requesting a refill of predniSONE (DELTASONE) 10 MG tablet to help with his gout.

## 2023-03-15 ENCOUNTER — Ambulatory Visit: Payer: Medicare Other | Admitting: Internal Medicine

## 2023-03-16 MED ORDER — PREDNISONE 10 MG PO TABS: ORAL_TABLET | ORAL | 1 refills | Status: DC

## 2023-03-16 NOTE — Telephone Encounter (Signed)
 Okay.  Done.  Thanks

## 2023-03-29 ENCOUNTER — Other Ambulatory Visit: Payer: Self-pay | Admitting: Internal Medicine

## 2023-04-03 ENCOUNTER — Encounter: Payer: Self-pay | Admitting: Internal Medicine

## 2023-04-03 ENCOUNTER — Ambulatory Visit (INDEPENDENT_AMBULATORY_CARE_PROVIDER_SITE_OTHER): Payer: Medicare Other | Admitting: Internal Medicine

## 2023-04-03 VITALS — BP 130/80 | HR 77 | Temp 97.9°F | Ht 69.0 in | Wt 236.0 lb

## 2023-04-03 DIAGNOSIS — N320 Bladder-neck obstruction: Secondary | ICD-10-CM

## 2023-04-03 DIAGNOSIS — I10 Essential (primary) hypertension: Secondary | ICD-10-CM | POA: Diagnosis not present

## 2023-04-03 DIAGNOSIS — M1000 Idiopathic gout, unspecified site: Secondary | ICD-10-CM

## 2023-04-03 DIAGNOSIS — C61 Malignant neoplasm of prostate: Secondary | ICD-10-CM | POA: Diagnosis not present

## 2023-04-03 LAB — CBC WITH DIFFERENTIAL/PLATELET
Basophils Absolute: 0 10*3/uL (ref 0.0–0.1)
Basophils Relative: 0.6 % (ref 0.0–3.0)
Eosinophils Absolute: 0.2 10*3/uL (ref 0.0–0.7)
Eosinophils Relative: 3.5 % (ref 0.0–5.0)
HCT: 37.7 % — ABNORMAL LOW (ref 39.0–52.0)
Hemoglobin: 12.3 g/dL — ABNORMAL LOW (ref 13.0–17.0)
Lymphocytes Relative: 39.9 % (ref 12.0–46.0)
Lymphs Abs: 2.3 10*3/uL (ref 0.7–4.0)
MCHC: 32.5 g/dL (ref 30.0–36.0)
MCV: 82.9 fl (ref 78.0–100.0)
Monocytes Absolute: 0.8 10*3/uL (ref 0.1–1.0)
Monocytes Relative: 13.8 % — ABNORMAL HIGH (ref 3.0–12.0)
Neutro Abs: 2.4 10*3/uL (ref 1.4–7.7)
Neutrophils Relative %: 42.2 % — ABNORMAL LOW (ref 43.0–77.0)
Platelets: 229 10*3/uL (ref 150.0–400.0)
RBC: 4.55 Mil/uL (ref 4.22–5.81)
RDW: 13.8 % (ref 11.5–15.5)
WBC: 5.7 10*3/uL (ref 4.0–10.5)

## 2023-04-03 LAB — COMPREHENSIVE METABOLIC PANEL
ALT: 7 U/L (ref 0–53)
AST: 10 U/L (ref 0–37)
Albumin: 4.1 g/dL (ref 3.5–5.2)
Alkaline Phosphatase: 133 U/L — ABNORMAL HIGH (ref 39–117)
BUN: 9 mg/dL (ref 6–23)
CO2: 31 meq/L (ref 19–32)
Calcium: 9.1 mg/dL (ref 8.4–10.5)
Chloride: 102 meq/L (ref 96–112)
Creatinine, Ser: 1 mg/dL (ref 0.40–1.50)
GFR: 70.89 mL/min (ref >60.00)
Glucose, Bld: 107 mg/dL — ABNORMAL HIGH (ref 70–99)
Potassium: 3.6 meq/L (ref 3.5–5.1)
Sodium: 140 meq/L (ref 135–145)
Total Bilirubin: 0.3 mg/dL (ref 0.2–1.2)
Total Protein: 6.9 g/dL (ref 6.0–8.3)

## 2023-04-03 LAB — TSH: TSH: 1.63 u[IU]/mL (ref 0.35–5.50)

## 2023-04-03 LAB — PSA: PSA: 0.17 ng/mL (ref 0.10–4.00)

## 2023-04-03 LAB — URIC ACID: Uric Acid, Serum: 5.3 mg/dL (ref 4.0–7.8)

## 2023-04-03 MED ORDER — PREDNISONE 10 MG PO TABS: ORAL_TABLET | ORAL | 1 refills | Status: DC

## 2023-04-03 MED ORDER — TAMSULOSIN HCL 0.4 MG PO CAPS: 0.4000 mg | ORAL_CAPSULE | Freq: Every day | ORAL | 1 refills | Status: AC

## 2023-04-03 MED ORDER — HYDROCODONE-ACETAMINOPHEN 7.5-325 MG PO TABS: 1.0000 | ORAL_TABLET | Freq: Four times a day (QID) | ORAL | 0 refills | Status: DC | PRN

## 2023-04-03 NOTE — Progress Notes (Signed)
 Subjective:  Patient ID: Jeffrey Carter, male    DOB: 27-Oct-1940  Age: 83 y.o. MRN: [id number]  CC: Medical Management of Chronic Issues (3 mnth f/u)   HPI SACHIT GILMAN presents for gout, BPH, HTN  Outpatient Medications Prior to Visit  Medication Sig Dispense Refill   acetaminophen (TYLENOL) 325 MG tablet Take 2 tablets (650 mg total) by mouth every 8 (eight) hours. 30 tablet 0   allopurinol (ZYLOPRIM) 100 MG tablet Take 0.5 tablets (50 mg total) by mouth daily. 45 tablet 3   amLODipine (NORVASC) 10 MG tablet Take 1 tablet (10 mg total) by mouth daily. 90 tablet 3   cloNIDine (CATAPRES) 0.3 MG tablet Take 1 tablet by mouth twice daily 180 tablet 3   diphenoxylate-atropine (LOMOTIL) 2.5-0.025 MG tablet Take 1-2 tablets by mouth 4 (four) times daily as needed for diarrhea or loose stools. 60 tablet 0   EQ ALLERGY RELIEF 10 MG tablet TAKE ONE TABLET BY MOUTH ONCE DAILY 90 tablet 3   finasteride (PROSCAR) 5 MG tablet Take 5 mg by mouth daily.     fluticasone (FLONASE) 50 MCG/ACT nasal spray Place 2 sprays into both nostrils daily. (Patient taking differently: Place 2 sprays into both nostrils daily as needed for allergies.) 16 g 2   furosemide (LASIX) 40 MG tablet Take 1 tablet (40 mg total) by mouth daily. 90 tablet 3   losartan (COZAAR) 100 MG tablet Take 1 tablet by mouth once daily 90 tablet 3   lovastatin (MEVACOR) 40 MG tablet TAKE 1 TABLET BY MOUTH ONCE DAILY IN THE EVENING AT  6  PM 90 tablet 1   ondansetron (ZOFRAN-ODT) 4 MG disintegrating tablet Take 1 tablet (4 mg total) by mouth every 8 (eight) hours as needed for nausea or vomiting. 20 tablet 0   pantoprazole (PROTONIX) 40 MG tablet Take 1 tablet (40 mg total) by mouth daily. 30 tablet 1   Potassium Chloride ER 20 MEQ TBCR Take 1 tablet by mouth once daily 90 tablet 3   sildenafil (VIAGRA) 100 MG tablet Take 1 tablet (100 mg total) by mouth as needed for erectile dysfunction. 12 tablet 5   HYDROcodone-acetaminophen (NORCO)  7.5-325 MG tablet Take 1 tablet by mouth every 6 (six) hours as needed for moderate pain. 60 tablet 0   predniSONE (DELTASONE) 10 MG tablet TAKE 3 TABLETS BY MOUTH ON DAY 1, THEN 2 TABLETS ON DAY 2 THEN 1 TABLET ON DAY 3 AS NEEDED FOR GOUT ATTACK. TAKE AFTER MEALS 18 tablet 1   tamsulosin (FLOMAX) 0.4 MG CAPS capsule TAKE 1 CAPSULE BY MOUTH  DAILY (Patient taking differently: Take 0.4 mg by mouth 2 (two) times daily.) 90 capsule 1   No facility-administered medications prior to visit.    ROS: Review of Systems  Constitutional:  Negative for appetite change, fatigue and unexpected weight change.  HENT:  Negative for congestion, nosebleeds, sneezing, sore throat and trouble swallowing.   Eyes:  Negative for itching and visual disturbance.  Respiratory:  Negative for cough.   Cardiovascular:  Negative for chest pain, palpitations and leg swelling.  Gastrointestinal:  Negative for abdominal distention, blood in stool, diarrhea and nausea.  Genitourinary:  Positive for decreased urine volume and frequency. Negative for hematuria.  Musculoskeletal:  Positive for arthralgias and back pain. Negative for gait problem, joint swelling and neck pain.  Skin:  Negative for rash.  Neurological:  Negative for dizziness, tremors, speech difficulty and weakness.  Psychiatric/Behavioral:  Negative for agitation, dysphoric mood,  sleep disturbance and suicidal ideas. The patient is not nervous/anxious.     Objective:  BP 130/80   Pulse 77   Temp 97.9 F (36.6 C) (Oral)   Ht 5\' 9"  (1.753 m)   Wt 236 lb (107 kg)   SpO2 92%   BMI 34.85 kg/m   BP Readings from Last 3 Encounters:  04/03/23 130/80  03/04/23 (!) 168/109  12/28/22 138/82    Wt Readings from Last 3 Encounters:  04/03/23 236 lb (107 kg)  03/04/23 235 lb 14.3 oz (107 kg)  12/28/22 236 lb (107 kg)    Physical Exam Constitutional:      General: He is not in acute distress.    Appearance: He is well-developed. He is obese.      Comments: NAD  Eyes:     Conjunctiva/sclera: Conjunctivae normal.     Pupils: Pupils are equal, round, and reactive to light.  Neck:     Thyroid: No thyromegaly.     Vascular: No JVD.  Cardiovascular:     Rate and Rhythm: Normal rate and regular rhythm.     Heart sounds: Normal heart sounds. No murmur heard.    No friction rub. No gallop.  Pulmonary:     Effort: Pulmonary effort is normal. No respiratory distress.     Breath sounds: Normal breath sounds. No wheezing or rales.  Chest:     Chest wall: No tenderness.  Abdominal:     General: Bowel sounds are normal. There is no distension.     Palpations: Abdomen is soft. There is no mass.     Tenderness: There is no abdominal tenderness. There is no guarding or rebound.  Musculoskeletal:        General: No tenderness. Normal range of motion.     Cervical back: Normal range of motion.     Right lower leg: No edema.     Left lower leg: No edema.  Lymphadenopathy:     Cervical: No cervical adenopathy.  Skin:    General: Skin is warm and dry.     Findings: No rash.  Neurological:     Mental Status: He is alert and oriented to person, place, and time.     Cranial Nerves: No cranial nerve deficit.     Motor: No abnormal muscle tone.     Coordination: Coordination normal.     Gait: Gait normal.     Deep Tendon Reflexes: Reflexes are normal and symmetric.  Psychiatric:        Behavior: Behavior normal.        Thought Content: Thought content normal.        Judgment: Judgment normal.   Rectal - per ER visit  Lab Results  Component Value Date   WBC 4.9 03/04/2023   HGB 11.8 (L) 03/04/2023   HCT 35.3 (L) 03/04/2023   PLT 231 03/04/2023   GLUCOSE 115 (H) 03/04/2023   CHOL 131 10/15/2019   TRIG 67 10/15/2019   HDL 43 10/15/2019   LDLCALC 74 10/15/2019   ALT 9 03/04/2023   AST 15 03/04/2023   NA 141 03/04/2023   K 3.0 (L) 03/04/2023   CL 106 03/04/2023   CREATININE 1.06 03/04/2023   BUN 14 03/04/2023   CO2 24 03/04/2023    TSH 2.26 10/15/2019   PSA 0.11 10/15/2019   INR 1.01 10/12/2014   HGBA1C 6.2 06/14/2021    No results found.  Assessment & Plan:   Problem List Items Addressed This Visit  Essential hypertension   Relevant Orders   Comprehensive metabolic panel   CBC with Differential/Platelet   PSA   TSH   Bladder neck obstruction - Primary   Recent urinary retention - resolved. F/u w/Dr Liliane Shi Re-start Flomax      Prostate cancer [facility])   Recent urinary retention 02/2023  - resolved. F/u w/Dr Liliane Shi Flomax prn      Relevant Medications   predniSONE (DELTASONE) 10 MG tablet   Other Relevant Orders   Comprehensive metabolic panel   CBC with Differential/Platelet   PSA   TSH   Gout attack    Prednisone po prn - see Rx, Norco prn. Use prn. Allopurinol option was discussed - pt agreed  Potential benefits of a short term opioids use as well as potential risks (i.e. addiction risk, apnea etc) and complications (i.e. Somnolence, constipation and others) were explained to the patient and were aknowledged.      Relevant Orders   Uric acid      Meds ordered this encounter  Medications   HYDROcodone-acetaminophen (NORCO) 7.5-325 MG tablet    Sig: Take 1 tablet by mouth every 6 (six) hours as needed for moderate pain (pain score 4-6).    Dispense:  20 tablet    Refill:  0   predniSONE (DELTASONE) 10 MG tablet    Sig: TAKE 3 TABLETS BY MOUTH ON DAY 1, THEN 2 TABLETS ON DAY 2 THEN 1 TABLET ON DAY 3 AS NEEDED FOR GOUT ATTACK. TAKE AFTER MEALS    Dispense:  18 tablet    Refill:  1   tamsulosin (FLOMAX) 0.4 MG CAPS capsule    Sig: Take 1 capsule (0.4 mg total) by mouth daily.    Dispense:  90 capsule    Refill:  1      Follow-up: Return in about 3 months (around 07/04/2023) for a follow-up visit.  Sonda Primes, MD

## 2023-04-03 NOTE — Assessment & Plan Note (Signed)
 Recent urinary retention 02/2023  - resolved. F/u w/Dr Liliane Shi Flomax prn

## 2023-04-03 NOTE — Assessment & Plan Note (Addendum)
 Recent urinary retention - resolved. F/u w/Dr Liliane Shi Re-start Flomax

## 2023-04-03 NOTE — Assessment & Plan Note (Signed)
Prednisone po prn - see Rx, Norco prn. Use prn. Allopurinol option was discussed - pt agreed  Potential benefits of a short term opioids use as well as potential risks (i.e. addiction risk, apnea etc) and complications (i.e. Somnolence, constipation and others) were explained to the patient and were aknowledged.

## 2023-04-11 ENCOUNTER — Encounter: Payer: Self-pay | Admitting: Family Medicine

## 2023-04-11 ENCOUNTER — Ambulatory Visit (INDEPENDENT_AMBULATORY_CARE_PROVIDER_SITE_OTHER): Admitting: Family Medicine

## 2023-04-11 ENCOUNTER — Other Ambulatory Visit: Payer: Self-pay

## 2023-04-11 ENCOUNTER — Emergency Department (HOSPITAL_COMMUNITY)
Admission: EM | Admit: 2023-04-11 | Discharge: 2023-04-11 | Disposition: A | Discharge status: Home / Self Care | Attending: Emergency Medicine | Admitting: Emergency Medicine

## 2023-04-11 ENCOUNTER — Encounter (HOSPITAL_COMMUNITY): Payer: Self-pay

## 2023-04-11 VITALS — BP 161/67 | HR 95 | Temp 98.6°F | Ht 69.0 in | Wt 230.0 lb

## 2023-04-11 DIAGNOSIS — I10 Essential (primary) hypertension: Secondary | ICD-10-CM | POA: Diagnosis not present

## 2023-04-11 DIAGNOSIS — E876 Hypokalemia: Secondary | ICD-10-CM | POA: Diagnosis not present

## 2023-04-11 DIAGNOSIS — R0602 Shortness of breath: Secondary | ICD-10-CM | POA: Diagnosis not present

## 2023-04-11 DIAGNOSIS — I4891 Unspecified atrial fibrillation: Secondary | ICD-10-CM | POA: Diagnosis not present

## 2023-04-11 DIAGNOSIS — I499 Cardiac arrhythmia, unspecified: Secondary | ICD-10-CM | POA: Diagnosis not present

## 2023-04-11 DIAGNOSIS — M542 Cervicalgia: Secondary | ICD-10-CM

## 2023-04-11 DIAGNOSIS — R110 Nausea: Secondary | ICD-10-CM

## 2023-04-11 DIAGNOSIS — R000 Tachycardia, unspecified: Secondary | ICD-10-CM | POA: Diagnosis not present

## 2023-04-11 DIAGNOSIS — Z7901 Long term (current) use of anticoagulants: Secondary | ICD-10-CM | POA: Insufficient documentation

## 2023-04-11 DIAGNOSIS — R519 Headache, unspecified: Secondary | ICD-10-CM | POA: Diagnosis not present

## 2023-04-11 DIAGNOSIS — Z79899 Other long term (current) drug therapy: Secondary | ICD-10-CM | POA: Diagnosis not present

## 2023-04-11 LAB — CBC WITH DIFFERENTIAL/PLATELET
Abs Immature Granulocytes: 0.02 10*3/uL (ref 0.00–0.07)
Basophils Absolute: 0 10*3/uL (ref 0.0–0.1)
Basophils Relative: 0 %
Eosinophils Absolute: 0 10*3/uL (ref 0.0–0.5)
Eosinophils Relative: 1 %
HCT: 36.6 % — ABNORMAL LOW (ref 39.0–52.0)
Hemoglobin: 12.3 g/dL — ABNORMAL LOW (ref 13.0–17.0)
Immature Granulocytes: 0 %
Lymphocytes Relative: 13 %
Lymphs Abs: 1 10*3/uL (ref 0.7–4.0)
MCH: 27 pg (ref 26.0–34.0)
MCHC: 33.6 g/dL (ref 30.0–36.0)
MCV: 80.4 fL (ref 80.0–100.0)
Monocytes Absolute: 0.9 10*3/uL (ref 0.1–1.0)
Monocytes Relative: 12 %
Neutro Abs: 5.3 10*3/uL (ref 1.7–7.7)
Neutrophils Relative %: 74 %
Platelets: 245 10*3/uL (ref 150–400)
RBC: 4.55 MIL/uL (ref 4.22–5.81)
RDW: 13.8 % (ref 11.5–15.5)
WBC: 7.2 10*3/uL (ref 4.0–10.5)
nRBC: 0 % (ref 0.0–0.2)

## 2023-04-11 LAB — BASIC METABOLIC PANEL
Anion gap: 6 (ref 5–15)
BUN: 11 mg/dL (ref 8–23)
CO2: 28 mmol/L (ref 22–32)
Calcium: 8.6 mg/dL — ABNORMAL LOW (ref 8.9–10.3)
Chloride: 102 mmol/L (ref 98–111)
Creatinine, Ser: 1.01 mg/dL (ref 0.61–1.24)
GFR, Estimated: >60 mL/min (ref >60)
Glucose, Bld: 135 mg/dL — ABNORMAL HIGH (ref 70–99)
Potassium: 3.1 mmol/L — ABNORMAL LOW (ref 3.5–5.1)
Sodium: 136 mmol/L (ref 135–145)

## 2023-04-11 LAB — MAGNESIUM: Magnesium: 2 mg/dL (ref 1.7–2.4)

## 2023-04-11 MED ORDER — METOPROLOL SUCCINATE ER 25 MG PO TB24
12.5000 mg | ORAL_TABLET | Freq: Every day | ORAL | Status: DC
  Administered 2023-04-11: 12.5 mg via ORAL
  Filled 2023-04-11: qty 1

## 2023-04-11 MED ORDER — KETOROLAC TROMETHAMINE 15 MG/ML IJ SOLN
15.0000 mg | Freq: Once | INTRAMUSCULAR | Status: AC
  Administered 2023-04-11: 15 mg via INTRAVENOUS
  Filled 2023-04-11: qty 1

## 2023-04-11 MED ORDER — APIXABAN 5 MG PO TABS: 5.0000 mg | ORAL_TABLET | Freq: Two times a day (BID) | ORAL | 0 refills | Status: DC

## 2023-04-11 MED ORDER — ACETAMINOPHEN 500 MG PO TABS
1000.0000 mg | ORAL_TABLET | Freq: Once | ORAL | Status: AC
  Administered 2023-04-11: 1000 mg via ORAL
  Filled 2023-04-11: qty 2

## 2023-04-11 MED ORDER — METOPROLOL SUCCINATE ER 25 MG PO TB24: 12.5000 mg | ORAL_TABLET | Freq: Every day | ORAL | 0 refills | Status: DC

## 2023-04-11 MED ORDER — POTASSIUM CHLORIDE CRYS ER 20 MEQ PO TBCR: 40.0000 meq | EXTENDED_RELEASE_TABLET | Freq: Every day | ORAL | 0 refills | Status: DC

## 2023-04-11 MED ORDER — DICLOFENAC SODIUM 1 % EX GEL: 4.0000 g | Freq: Four times a day (QID) | CUTANEOUS | 0 refills | Status: DC

## 2023-04-11 MED ORDER — METHOCARBAMOL 500 MG PO TABS: 500.0000 mg | ORAL_TABLET | Freq: Two times a day (BID) | ORAL | 0 refills | Status: AC

## 2023-04-11 MED ORDER — APIXABAN 5 MG PO TABS
5.0000 mg | ORAL_TABLET | Freq: Two times a day (BID) | ORAL | Status: DC
  Administered 2023-04-11: 5 mg via ORAL
  Filled 2023-04-11: qty 1

## 2023-04-11 NOTE — Progress Notes (Signed)
   Acute Office Visit  Subjective:     Patient ID: CECILIO Carter, male    DOB: 1940-09-10, 83 y.o.   MRN: [id number]  Chief Complaint  Patient presents with   Acute Visit    Started last night, left shoulder, pain has moved to the back of his neck    HPI Patient is in today for head pain at the base of the skull and neck. Reports episode of nausea earlier today, no vomiting.  Has not attempted OTC treatment.  Denies similar or previous symptoms. Denies chest pain, SOB, diaphoresis, previous cardiac hx other than hypertension. Usually well controlled on   ROS Per HPI      Objective:    BP (!) 161/67 (BP Location: Left Arm, Patient Position: Sitting)   Pulse 95 Comment: Irregular beat  Temp 98.6 F (37 C) (Temporal)   Ht 5\' 9"  (1.753 m)   Wt 230 lb (104.3 kg)   SpO2 97%   BMI 33.97 kg/m    Physical Exam Vitals and nursing note reviewed.  Constitutional:      Appearance: Normal appearance. He is ill-appearing.     Comments: Ill appearing, appears uncomfortable, appears to worsen the longer he is here. Unable to sit still  HENT:     Head: Normocephalic and atraumatic.  Eyes:     Extraocular Movements: Extraocular movements intact.  Neck:     Comments: LROM Cardiovascular:     Rate and Rhythm: Tachycardia present. Rhythm irregular.     Pulses: Normal pulses.     Heart sounds: Normal heart sounds.  Pulmonary:     Effort: Pulmonary effort is normal. No respiratory distress.     Breath sounds: Normal breath sounds. No wheezing, rhonchi or rales.     Comments: Able to speak in complete sentences, appears SOB afterward Musculoskeletal:     Cervical back: No rigidity or tenderness.     Right lower leg: No edema.     Left lower leg: No edema.  Lymphadenopathy:     Cervical: No cervical adenopathy.  Skin:    General: Skin is warm and dry.  Neurological:     General: No focal deficit present.     Mental Status: He is alert and oriented to person, place, and time.   Psychiatric:        Mood and Affect: Mood normal.    No results found for any visits on 04/11/23. EKG: ST and T wave abnormality Indication:  irregular heart rate Rate: 138 Interpretation: agree, unspecific t wave abnormality Changes from previous: yes       Assessment & Plan:   Nonintractable headache, unspecified chronicity pattern, unspecified headache type  Neck pain  Irregular heart rate -     EKG 12-Lead  SOB (shortness of breath)  Nausea  Tachycardia     No orders of the defined types were placed in this encounter.  Concern for possible acute cardiac changes on EKG, appears to be SOB when speaking in complete sentences. Concern for CVD process given severity and character of head pain. To ER via EMS for further eval and treatment   F/u with pcp after ER eval  Moshe Cipro, FNP

## 2023-04-11 NOTE — ED Provider Notes (Signed)
  EMERGENCY DEPARTMENT AT [facility] Provider Note   CSN: [id number] Arrival date & time: 04/11/23  1245     History  Chief Complaint  Patient presents with   Tachycardia    Jeffrey Carter is a 83 y.o. male.  83 yo with a chief complaints of left-sided neck pain.  The patient said he woke up this morning with a crick in his neck and he went to go see his family doctor for this and they noted that he was in atrial fibrillation with rapid ventricular response and sent him to the ED for evaluation.  He denies any chest pain or difficulty breathing.  Does not feel like his heart is beating fast.  Denies history of A-fib.        Home Medications Prior to Admission medications   Medication Sig Start Date End Date Taking? Authorizing Provider  apixaban (ELIQUIS) 5 MG TABS tablet Take 1 tablet (5 mg total) by mouth 2 (two) times daily. 04/11/23  Yes Melene Plan, DO  diclofenac Sodium (VOLTAREN) 1 % GEL Apply 4 g topically 4 (four) times daily. 04/11/23  Yes Melene Plan, DO  methocarbamol (ROBAXIN) 500 MG tablet Take 1 tablet (500 mg total) by mouth 2 (two) times daily. 04/11/23  Yes Melene Plan, DO  metoprolol succinate (TOPROL-XL) 25 MG 24 hr tablet Take 0.5 tablets (12.5 mg total) by mouth daily. 04/11/23  Yes Melene Plan, DO  potassium chloride SA (KLOR-CON M) 20 MEQ tablet Take 2 tablets (40 mEq total) by mouth daily. 04/11/23  Yes Melene Plan, DO  acetaminophen (TYLENOL) 325 MG tablet Take 2 tablets (650 mg total) by mouth every 8 (eight) hours. 08/31/16   Caccavale, Sophia, PA-C  allopurinol (ZYLOPRIM) 100 MG tablet Take 0.5 tablets (50 mg total) by mouth daily. 08/31/22   Plotnikov, Georgina Quint, MD  amLODipine (NORVASC) 10 MG tablet Take 1 tablet (10 mg total) by mouth daily. 09/28/22   Plotnikov, Georgina Quint, MD  cloNIDine (CATAPRES) 0.3 MG tablet Take 1 tablet by mouth twice daily 03/28/22   Plotnikov, Georgina Quint, MD  diphenoxylate-atropine (LOMOTIL) 2.5-0.025 MG tablet Take 1-2  tablets by mouth 4 (four) times daily as needed for diarrhea or loose stools. 12/01/22   Plotnikov, Georgina Quint, MD  EQ ALLERGY RELIEF 10 MG tablet TAKE ONE TABLET BY MOUTH ONCE DAILY 12/18/16   Plotnikov, Georgina Quint, MD  finasteride (PROSCAR) 5 MG tablet Take 5 mg by mouth daily. 07/29/21   [provider]  fluticasone (FLONASE) 50 MCG/ACT nasal spray Place 2 sprays into both nostrils daily. Patient taking differently: Place 2 sprays into both nostrils daily as needed for allergies. 03/11/15   Coralyn Helling, MD  furosemide (LASIX) 40 MG tablet Take 1 tablet (40 mg total) by mouth daily. 03/31/23   Plotnikov, Georgina Quint, MD  HYDROcodone-acetaminophen (NORCO) 7.5-325 MG tablet Take 1 tablet by mouth every 6 (six) hours as needed for moderate pain (pain score 4-6). 04/03/23   Plotnikov, Georgina Quint, MD  losartan (COZAAR) 100 MG tablet Take 1 tablet by mouth once daily 03/31/23   Plotnikov, Georgina Quint, MD  lovastatin (MEVACOR) 40 MG tablet TAKE 1 TABLET BY MOUTH ONCE DAILY IN THE EVENING AT  6  PM 09/28/22   Plotnikov, Georgina Quint, MD  ondansetron (ZOFRAN-ODT) 4 MG disintegrating tablet Take 1 tablet (4 mg total) by mouth every 8 (eight) hours as needed for nausea or vomiting. 12/01/22   Plotnikov, Georgina Quint, MD  pantoprazole (PROTONIX) 40 MG tablet  Take 1 tablet (40 mg total) by mouth daily. 12/01/22 12/01/23  Plotnikov, Georgina Quint, MD  Potassium Chloride ER 20 MEQ TBCR Take 1 tablet by mouth once daily 03/31/23   Plotnikov, Georgina Quint, MD  predniSONE (DELTASONE) 10 MG tablet TAKE 3 TABLETS BY MOUTH ON DAY 1, THEN 2 TABLETS ON DAY 2 THEN 1 TABLET ON DAY 3 AS NEEDED FOR GOUT ATTACK. TAKE AFTER MEALS 04/03/23   Plotnikov, Georgina Quint, MD  sildenafil (VIAGRA) 100 MG tablet Take 1 tablet (100 mg total) by mouth as needed for erectile dysfunction. 01/11/16   Plotnikov, Georgina Quint, MD  tamsulosin (FLOMAX) 0.4 MG CAPS capsule Take 1 capsule (0.4 mg total) by mouth daily. 04/03/23   Plotnikov, Georgina Quint, MD      Allergies     Percocet [oxycodone-acetaminophen]    Review of Systems   Review of Systems  Physical Exam Updated Vital Signs BP (!) 181/102   Pulse 87   Temp 98.2 F (36.8 C)   Resp 20   Ht 5\' 9"  (1.753 m)   Wt 104.3 kg   SpO2 100%   BMI 33.96 kg/m  Physical Exam Vitals and nursing note reviewed.  Constitutional:      Appearance: He is well-developed.  HENT:     Head: Normocephalic and atraumatic.  Eyes:     Pupils: Pupils are equal, round, and reactive to light.  Neck:     Vascular: No JVD.  Cardiovascular:     Rate and Rhythm: Tachycardia present. Rhythm irregular.     Heart sounds: No murmur heard.    No friction rub. No gallop.  Pulmonary:     Effort: No respiratory distress.     Breath sounds: No wheezing.  Abdominal:     General: There is no distension.     Tenderness: There is no abdominal tenderness. There is no guarding or rebound.  Musculoskeletal:        General: Normal range of motion.     Cervical back: Normal range of motion and neck supple.     Comments: Pain along the left trapezius muscle belly reproduces his discomfort.  Pulse motor and sensation intact in left upper extremity.  No midline C-spine tenderness able to rotate his head without discomfort.  Skin:    Coloration: Skin is not pale.     Findings: No rash.  Neurological:     Mental Status: He is alert and oriented to person, place, and time.  Psychiatric:        Behavior: Behavior normal.     ED Results / Procedures / Treatments   Labs (all labs ordered are listed, but only abnormal results are displayed) Labs Reviewed  CBC WITH DIFFERENTIAL/PLATELET - Abnormal; Notable for the following components:      Result Value   Hemoglobin 12.3 (*)    HCT 36.6 (*)    All other components within normal limits  BASIC METABOLIC PANEL - Abnormal; Notable for the following components:   Potassium 3.1 (*)    Glucose, Bld 135 (*)    Calcium 8.6 (*)    All other components within normal limits  MAGNESIUM     EKG EKG Interpretation Date/Time:  Tuesday April 11 2023 12:54:00 EDT Ventricular Rate:  95 PR Interval:    QRS Duration:  91 QT Interval:  364 QTC Calculation: 458 R Axis:   85  Text Interpretation: Atrial fibrillation Borderline right axis deviation Repol abnrm suggests ischemia, inferior leads Otherwise no significant change Confirmed by Adela Lank,  Jesusita Oka [phone]) on 04/11/2023 1:48:17 PM  Radiology No results found.  Procedures Procedures    Medications Ordered in ED Medications  metoprolol succinate (TOPROL-XL) 24 hr tablet 12.5 mg (12.5 mg Oral Given 04/11/23 1345)  apixaban (ELIQUIS) tablet 5 mg (5 mg Oral Given 04/11/23 1512)  acetaminophen (TYLENOL) tablet 1,000 mg (1,000 mg Oral Given 04/11/23 1345)  ketorolac (TORADOL) 15 MG/ML injection 15 mg (15 mg Intravenous Given 04/11/23 1346)    ED Course/ Medical Decision Making/ A&P                                 Medical Decision Making Amount and/or Complexity of Data Reviewed Labs: ordered.  Risk OTC drugs. Prescription drug management.   83 yo M with a chief complaints of A-fib with RVR.  He actually to go see his family doctor today for what sounds like torticollis.  Left-sided neck pain that he had upon waking.  Pain reproduced on palpation of the trapezius muscle belly.  No significant electrolyte abnormalities.  No acute anemia.  Will give a dose of metoprolol.  Reassess.  Patient continues to be rate controlled on repeat assessment.  Mild hypokalemia, no significant electrolyte abnormality otherwise.  No acute anemia.  Magnesium level normal.  Patient's neck pain improved after symptomatic therapy here.  Started on Eliquis.  Metoprolol.  Cardiology follow-up.  CHA2DS2/VAS Stroke Risk Points  Current as of 4 minutes ago     3 >= 2 Points: High Risk  1 to 1.99 Points: Medium Risk  0 Points: Low Risk    No Change      Details    This score determines the patient's risk of having a stroke if the  patient has  atrial fibrillation.       Points Metrics  0 Has Congestive Heart Failure:  No    Current as of 4 minutes ago  0 Has Vascular Disease:  No    Current as of 4 minutes ago  1 Has Hypertension:  Yes    Current as of 4 minutes ago  2 Age:  13    Current as of 4 minutes ago  0 Has Diabetes Excluding Gestational Diabetes:  No    Current as of 4 minutes ago  0 Had Stroke:  No  Had TIA:  No  Had Thromboembolism:  No    Current as of 4 minutes ago  0 Male:  No    Current as of 4 minutes ago          3:34 PM:  I have discussed the diagnosis/risks/treatment options with the patient.  Evaluation and diagnostic testing in the emergency department does not suggest an emergent condition requiring admission or immediate intervention beyond what has been performed at this time.  They will follow up with PCP, afib clinic. We also discussed returning to the ED immediately if new or worsening sx occur. We discussed the sx which are most concerning (e.g., sudden worsening pain, fever, inability to tolerate by mouth) that necessitate immediate return. Medications administered to the patient during their visit and any new prescriptions provided to the patient are listed below.  Medications given during this visit Medications  metoprolol succinate (TOPROL-XL) 24 hr tablet 12.5 mg (12.5 mg Oral Given 04/11/23 1345)  apixaban (ELIQUIS) tablet 5 mg (5 mg Oral Given 04/11/23 1512)  acetaminophen (TYLENOL) tablet 1,000 mg (1,000 mg Oral Given 04/11/23 1345)  ketorolac (TORADOL) 15 MG/ML  injection 15 mg (15 mg Intravenous Given 04/11/23 1346)     The patient appears reasonably screen and/or stabilized for discharge and I doubt any other medical condition or other [facility] requiring further screening, evaluation, or treatment in the ED at this time prior to discharge.          Final Clinical Impression(s) / ED Diagnoses Final diagnoses:  Atrial fibrillation with RVR (HCC)    Rx / DC Orders ED Discharge  Orders          Ordered    apixaban (ELIQUIS) 5 MG TABS tablet  2 times daily        04/11/23 1447    metoprolol succinate (TOPROL-XL) 25 MG 24 hr tablet  Daily        04/11/23 1447    diclofenac Sodium (VOLTAREN) 1 % GEL  4 times daily        04/11/23 1447    methocarbamol (ROBAXIN) 500 MG tablet  2 times daily        04/11/23 1447    potassium chloride SA (KLOR-CON M) 20 MEQ tablet  Daily        04/11/23 1533              Melene Plan, DO 04/11/23 1534

## 2023-04-11 NOTE — ED Triage Notes (Signed)
 Patient coming from PCP. Patient went to PCP for neck pain. Patient found to be in Afib. No hx of. Patient denies CP and SOB.

## 2023-04-11 NOTE — Discharge Instructions (Signed)
 Please take the medications as prescribed.  Please follow-up with the A-fib clinic call them today to try and set up an appointment to be seen.  If you are still in atrial fibrillation at your visits then they will consider bringing you into the hospital and getting you out of that rhythm.  I think you strained the muscles in your neck.  Please take Tylenol around-the-clock and use the gel and then you can try the muscle relaxer if you would like.  Please help your family doctor for this.

## 2023-04-11 NOTE — Patient Instructions (Signed)
Go directly to the ER for further evaluation and treatment ° °

## 2023-04-13 NOTE — Addendum Note (Signed)
 Addended by: Sherald Barge on: 04/13/2023 09:33 AM   Modules accepted: Orders

## 2023-04-24 ENCOUNTER — Ambulatory Visit (HOSPITAL_COMMUNITY)
Admission: RE | Admit: 2023-04-24 | Discharge: 2023-04-24 | Disposition: A | Discharge status: Home / Self Care | Source: Ambulatory Visit | Attending: Physician Assistant | Admitting: Physician Assistant

## 2023-04-24 ENCOUNTER — Encounter (HOSPITAL_COMMUNITY): Payer: Self-pay | Admitting: Physician Assistant

## 2023-04-24 ENCOUNTER — Inpatient Hospital Stay (HOSPITAL_COMMUNITY)
Admission: RE | Admit: 2023-04-24 | Discharge: 2023-04-24 | Disposition: A | Discharge status: Home / Self Care | Source: Ambulatory Visit | Attending: Physician Assistant

## 2023-04-24 VITALS — BP 144/100 | HR 80 | Ht 69.0 in | Wt 229.0 lb

## 2023-04-24 DIAGNOSIS — Z79899 Other long term (current) drug therapy: Secondary | ICD-10-CM | POA: Diagnosis not present

## 2023-04-24 DIAGNOSIS — I480 Paroxysmal atrial fibrillation: Secondary | ICD-10-CM

## 2023-04-24 DIAGNOSIS — D6869 Other thrombophilia: Secondary | ICD-10-CM | POA: Diagnosis not present

## 2023-04-24 DIAGNOSIS — Z7901 Long term (current) use of anticoagulants: Secondary | ICD-10-CM | POA: Diagnosis not present

## 2023-04-24 DIAGNOSIS — G4733 Obstructive sleep apnea (adult) (pediatric): Secondary | ICD-10-CM | POA: Diagnosis not present

## 2023-04-24 DIAGNOSIS — E785 Hyperlipidemia, unspecified: Secondary | ICD-10-CM | POA: Insufficient documentation

## 2023-04-24 DIAGNOSIS — I10 Essential (primary) hypertension: Secondary | ICD-10-CM | POA: Diagnosis not present

## 2023-04-24 MED ORDER — METOPROLOL SUCCINATE ER 25 MG PO TB24: 12.5000 mg | ORAL_TABLET | Freq: Every day | ORAL | 4 refills | Status: DC

## 2023-04-24 MED ORDER — APIXABAN 5 MG PO TABS: 5.0000 mg | ORAL_TABLET | Freq: Two times a day (BID) | ORAL | 4 refills | Status: DC

## 2023-04-24 NOTE — Patient Instructions (Signed)
 Echocardiogram - scheduling will call once insurance approves

## 2023-04-24 NOTE — Progress Notes (Signed)
 Primary Care Physician: Tresa Garter, MD Primary Cardiologist: None Electrophysiologist: None  Referring Physician: ED   Jeffrey Carter is a 83 y.o. male with a history of HTN, HLD, OSA, atrial fibrillation who presents for consultation in the [facility].  The patient was initially diagnosed with atrial fibrillation 04/11/23 incidentally at his PCP office. He was being seen for left sided neck pain and was found to have an irregular pulse. ECG showed afib with RVR and he was sent to the ED for evaluation. Patient was started on Eliquis for stroke prevention and Toprol for rate control.   Today, patient has converted to SR. He was unaware of his arrhythmia. No bleeding issues on anticoagulation. He is compliant with his CPAP.   Today, he denies symptoms of palpitations, chest pain, shortness of breath, orthopnea, PND, lower extremity edema, dizziness, presyncope, syncope, bleeding, or neurologic sequela. The patient is tolerating medications without difficulties and is otherwise without complaint today.    Atrial Fibrillation Risk Factors:  he does have symptoms or diagnosis of sleep apnea. he does not have a history of rheumatic fever. he does not have a history of alcohol use. The patient does not have a history of early familial atrial fibrillation or other arrhythmias.  Atrial Fibrillation Management history:  Previous antiarrhythmic drugs: none Previous cardioversions: none Previous ablations: none Anticoagulation history: Eliquis  ROS- All systems are reviewed and negative except as per the HPI above.  Past Medical History:  Diagnosis Date   Bladder outlet obstruction    BPH (benign prostatic hyperplasia)    Conjunctivitis, acute, bilateral    07-02-2015  per pcp note and started antibiotic drops   First degree heart block    History of acute conjunctivitis    07-02-2015  resolved after round of antibiotic eye drops   History of  gout    BIG TOE   History of urinary retention 12/2014   Hx of adenomatous colonic polyps 07/07/2016   Hyperlipidemia    Hypertension    Lower urinary tract symptoms (LUTS)    OSA on CPAP    MODERATE PER STUDY 03-08-2012   Osteoarthritis    PAC (premature atrial contraction)    Prostate cancer [facility]) urologist-  dr budzyn/  oncologist-  dr Rickard Rhymes   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 30 tablet 0   allopurinol (ZYLOPRIM) 100 MG tablet Take 0.5 tablets (50 mg total) by mouth daily. 45 tablet 3   amLODipine (NORVASC) 10 MG tablet Take 1 tablet (10 mg total) by mouth daily. 90 tablet 3   apixaban (ELIQUIS) 5 MG TABS tablet Take 1 tablet (5 mg total) by mouth 2 (two) times daily. 60 tablet 0   cloNIDine (CATAPRES) 0.3 MG tablet Take 1 tablet by mouth twice daily 180 tablet 3   diclofenac Sodium (VOLTAREN) 1 % GEL Apply 4 g topically 4 (four) times daily. 100 g 0   diphenoxylate-atropine (LOMOTIL) 2.5-0.025 MG tablet Take 1-2 tablets by mouth 4 (four) times daily as needed for diarrhea or  loose stools. 60 tablet 0   EQ ALLERGY RELIEF 10 MG tablet TAKE ONE TABLET BY MOUTH ONCE DAILY 90 tablet 3   finasteride (PROSCAR) 5 MG tablet Take 5 mg by mouth daily.     fluticasone (FLONASE) 50 MCG/ACT nasal spray Place 2 sprays into both nostrils daily. (Patient taking differently: Place 2 sprays into both nostrils daily as needed for allergies.) 16 g 2   furosemide (LASIX) 40 MG tablet Take 1 tablet (40 mg total) by mouth daily. 90 tablet 3   HYDROcodone-acetaminophen (NORCO) 7.5-325 MG tablet Take 1  tablet by mouth every 6 (six) hours as needed for moderate pain (pain score 4-6). 20 tablet 0   losartan (COZAAR) 100 MG tablet Take 1 tablet by mouth once daily 90 tablet 3   lovastatin (MEVACOR) 40 MG tablet TAKE 1 TABLET BY MOUTH ONCE DAILY IN THE EVENING AT  6  PM 90 tablet 1   methocarbamol (ROBAXIN) 500 MG tablet Take 1 tablet (500 mg total) by mouth 2 (two) times daily. 10 tablet 0   metoprolol succinate (TOPROL-XL) 25 MG 24 hr tablet Take 0.5 tablets (12.5 mg total) by mouth daily. 15 tablet 0   ondansetron (ZOFRAN-ODT) 4 MG disintegrating tablet Take 1 tablet (4 mg total) by mouth every 8 (eight) hours as needed for nausea or vomiting. 20 tablet 0   pantoprazole (PROTONIX) 40 MG tablet Take 1 tablet (40 mg total) by mouth daily. 30 tablet 1   Potassium Chloride ER 20 MEQ TBCR Take 1 tablet by mouth once daily 90 tablet 3   predniSONE (DELTASONE) 10 MG tablet TAKE 3 TABLETS BY MOUTH ON DAY 1, THEN 2 TABLETS ON DAY 2 THEN 1 TABLET ON DAY 3 AS NEEDED FOR GOUT ATTACK. TAKE AFTER MEALS 18 tablet 1   sildenafil (VIAGRA) 100 MG tablet Take 1 tablet (100 mg total) by mouth as needed for erectile dysfunction. 12 tablet 5   tamsulosin (FLOMAX) 0.4 MG CAPS capsule Take 1 capsule (0.4 mg total) by mouth daily. 90 capsule 1   No current facility-administered medications for this encounter.    Physical Exam: BP (!) 144/100   Pulse 80   Ht 5\' 9"  (1.753 m)   Wt 103.9 kg   BMI 33.82 kg/m   GEN: Well nourished, well developed in no acute distress NECK: No JVD; No carotid bruits CARDIAC: Regular rate and rhythm with occasional ectopy, no murmurs, rubs, gallops RESPIRATORY:  Clear to auscultation without rales, wheezing or rhonchi  ABDOMEN: Soft, non-tender, non-distended EXTREMITIES:  No edema; No deformity   Wt Readings from Last 3 Encounters:  04/24/23 103.9 kg  04/11/23 104.3 kg  04/11/23 104.3 kg     EKG today demonstrates  SR, 1st degree AV block, PACs, blocked PAC Vent. rate 80  BPM PR interval 224 ms QRS duration 88 ms QT/QTcB 386/445 ms    CHA2DS2-VASc Score = 3  The patient's score is based upon: CHF History: 0 HTN History: 1 Diabetes History: 0 Stroke History: 0 Vascular Disease History: 0 Age Score: 2 Gender Score: 0       ASSESSMENT AND PLAN: Paroxysmal Atrial Fibrillation (ICD10:  I48.0) The patient's CHA2DS2-VASc score is 3, indicating a 3.2% annual risk of stroke.   General education about afib provided and questions answered. We also discussed his stroke risk and the risks and benefits of anticoagulation. Check echocardiogram Will have him wears a two week Zio monitor to assess arrhythmia burden.  Continue Eliquis 5 mg BID Continue Toprol  12.5 mg daily  Secondary Hypercoagulable State (ICD10:  D68.69) The patient is at significant risk for stroke/thromboembolism based upon his CHA2DS2-VASc Score of 3.  Continue Apixaban (Eliquis). No bleeding issues.   HTN Diastolic BP elevated today. He is on several medications including amlodipine, clonidine, losartan, and metoprolol. Continue follow up with PCP.   OSA  Encouraged nightly CPAP The importance of adequate treatment of sleep apnea was discussed today in order to improve our ability to maintain sinus rhythm long term.  Followed by [facility]     Will refer patient to establish care with a primary cardiologist. Follow up in the AF clinic pending monitor results.        Jorja Loa PA-C Afib Clinic [facility] [address] [place], [place] [address] [phone]

## 2023-04-25 ENCOUNTER — Other Ambulatory Visit (HOSPITAL_COMMUNITY): Payer: Self-pay

## 2023-04-25 DIAGNOSIS — I4891 Unspecified atrial fibrillation: Secondary | ICD-10-CM

## 2023-04-26 ENCOUNTER — Ambulatory Visit (INDEPENDENT_AMBULATORY_CARE_PROVIDER_SITE_OTHER)

## 2023-04-26 DIAGNOSIS — I4891 Unspecified atrial fibrillation: Secondary | ICD-10-CM | POA: Diagnosis not present

## 2023-04-26 LAB — ECHOCARDIOGRAM COMPLETE
Area-P 1/2: 3.42 cm2
Calc EF: 56.4 %
S' Lateral: 3.83 cm
Single Plane A2C EF: 57.2 %
Single Plane A4C EF: 55.6 %

## 2023-04-28 ENCOUNTER — Encounter (HOSPITAL_COMMUNITY): Payer: Self-pay

## 2023-05-10 ENCOUNTER — Encounter

## 2023-05-10 NOTE — Progress Notes (Signed)
 This encounter was created in error - please disregard.  Called pt x3 with no answer/ VM was full.

## 2023-05-11 ENCOUNTER — Other Ambulatory Visit: Payer: Self-pay | Admitting: Internal Medicine

## 2023-05-17 DIAGNOSIS — I480 Paroxysmal atrial fibrillation: Secondary | ICD-10-CM | POA: Diagnosis not present

## 2023-05-22 NOTE — Addendum Note (Signed)
 Encounter addended by: Tess Fife, RN on: 05/22/2023 5:14 PM  Actions taken: Imaging Exam ended

## 2023-05-31 ENCOUNTER — Encounter (HOSPITAL_COMMUNITY): Payer: Self-pay | Admitting: Physician Assistant

## 2023-05-31 ENCOUNTER — Ambulatory Visit (HOSPITAL_COMMUNITY)
Admission: RE | Admit: 2023-05-31 | Discharge: 2023-05-31 | Disposition: A | Discharge status: Home / Self Care | Source: Ambulatory Visit | Attending: Physician Assistant | Admitting: Physician Assistant

## 2023-05-31 VITALS — BP 144/90 | HR 73 | Ht 69.0 in | Wt 231.8 lb

## 2023-05-31 DIAGNOSIS — D6869 Other thrombophilia: Secondary | ICD-10-CM

## 2023-05-31 DIAGNOSIS — I480 Paroxysmal atrial fibrillation: Secondary | ICD-10-CM | POA: Diagnosis not present

## 2023-05-31 MED ORDER — METOPROLOL SUCCINATE ER 25 MG PO TB24: 25.0000 mg | ORAL_TABLET | Freq: Every day | ORAL | 4 refills | Status: DC

## 2023-05-31 NOTE — Patient Instructions (Signed)
Increase metoprolol to 25mg once a day °

## 2023-05-31 NOTE — Progress Notes (Signed)
 Primary Care Physician: Genia Kettering, MD Primary Cardiologist: None Electrophysiologist: None  Referring Physician: ED   Jeffrey Carter is a 83 y.o. male with a history of HTN, HLD, OSA, atrial fibrillation who presents for follow up in the [facility].  The patient was initially diagnosed with atrial fibrillation 04/11/23 incidentally at his PCP office. He was being seen for left sided neck pain and was found to have an irregular pulse. ECG showed afib with RVR and he was sent to the ED for evaluation. Patient was started on Eliquis  for stroke prevention and Toprol  for rate control.   He wore 14 day cardiac monitor which showed 14% afib burden, avg HR 98 bpm.   Patient returns for follow up for atrial fibrillation. He is in SR today and feeling well. He was unaware of his arrhythmia while wearing the monitor. No bleeding issues on anticoagulation.   Today, he  denies symptoms of palpitations, chest pain, shortness of breath, orthopnea, PND, lower extremity edema, dizziness, presyncope, syncope, bleeding, or neurologic sequela. The patient is tolerating medications without difficulties and is otherwise without complaint today.    Atrial Fibrillation Risk Factors:  he does have symptoms or diagnosis of sleep apnea. he does not have a history of rheumatic fever. he does not have a history of alcohol use. The patient does not have a history of early familial atrial fibrillation or other arrhythmias.  Atrial Fibrillation Management history:  Previous antiarrhythmic drugs: none Previous cardioversions: none Previous ablations: none Anticoagulation history: Eliquis   ROS- All systems are reviewed and negative except as per the HPI above.  Past Medical History:  Diagnosis Date   Bladder outlet obstruction    BPH (benign prostatic hyperplasia)    Conjunctivitis, acute, bilateral    07-02-2015  per pcp note and started antibiotic drops   First  degree heart block    History of acute conjunctivitis    07-02-2015  resolved after round of antibiotic eye drops   History of gout    BIG TOE   History of urinary retention 12/2014   Hx of adenomatous colonic polyps 07/07/2016   Hyperlipidemia    Hypertension    Lower urinary tract symptoms (LUTS)    OSA on CPAP    MODERATE PER STUDY 03-08-2012   Osteoarthritis    PAC (premature atrial contraction)    Prostate cancer [facility]) urologist-  dr budzyn/  oncologist-  dr Drinda Gentry   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 30 tablet 0   allopurinol  (ZYLOPRIM ) 100 MG tablet Take 0.5 tablets (50 mg total) by mouth daily. 45 tablet 3   amLODipine  (NORVASC ) 10 MG tablet Take 1 tablet (10 mg total) by mouth daily. 90 tablet 3   apixaban  (ELIQUIS ) 5 MG TABS tablet Take 1 tablet (5 mg total) by mouth 2 (two) times daily. 60 tablet 4   cloNIDine  (CATAPRES ) 0.3 MG tablet Take 1 tablet by mouth twice daily 180 tablet 0   diclofenac  Sodium (VOLTAREN ) 1 % GEL Apply 4 g topically 4 (  four) times daily. 100 g 0   diphenoxylate -atropine  (LOMOTIL ) 2.5-0.025 MG tablet Take 1-2 tablets by mouth 4 (four) times daily as needed for diarrhea or loose stools. 60 tablet 0   EQ ALLERGY RELIEF 10 MG tablet TAKE ONE TABLET BY MOUTH ONCE DAILY 90 tablet 3   finasteride  (PROSCAR ) 5 MG tablet Take 5 mg by mouth daily.     fluticasone  (FLONASE ) 50 MCG/ACT nasal spray Place 2 sprays into both nostrils daily. (Patient taking differently: Place 2 sprays into both nostrils daily as needed for allergies.) 16 g 2   furosemide  (LASIX ) 40  MG tablet Take 1 tablet (40 mg total) by mouth daily. 90 tablet 3   HYDROcodone -acetaminophen  (NORCO) 7.5-325 MG tablet Take 1 tablet by mouth every 6 (six) hours as needed for moderate pain (pain score 4-6). 20 tablet 0   losartan  (COZAAR ) 100 MG tablet Take 1 tablet by mouth once daily 90 tablet 3   lovastatin  (MEVACOR ) 40 MG tablet TAKE 1 TABLET BY MOUTH ONCE DAILY IN THE EVENING AT  6  PM 90 tablet 1   methocarbamol  (ROBAXIN ) 500 MG tablet Take 1 tablet (500 mg total) by mouth 2 (two) times daily. 10 tablet 0   metoprolol  succinate (TOPROL -XL) 25 MG 24 hr tablet Take 0.5 tablets (12.5 mg total) by mouth daily. 15 tablet 4   ondansetron  (ZOFRAN -ODT) 4 MG disintegrating tablet Take 1 tablet (4 mg total) by mouth every 8 (eight) hours as needed for nausea or vomiting. 20 tablet 0   pantoprazole  (PROTONIX ) 40 MG tablet Take 1 tablet (40 mg total) by mouth daily. 30 tablet 1   Potassium Chloride  ER 20 MEQ TBCR Take 1 tablet by mouth once daily 90 tablet 3   predniSONE  (DELTASONE ) 10 MG tablet TAKE 3 TABLETS BY MOUTH ON DAY 1, THEN 2 TABLETS ON DAY 2 THEN 1 TABLET ON DAY 3 AS NEEDED FOR GOUT ATTACK. TAKE AFTER MEALS 18 tablet 1   sildenafil  (VIAGRA ) 100 MG tablet Take 1 tablet (100 mg total) by mouth as needed for erectile dysfunction. 12 tablet 5   tamsulosin  (FLOMAX ) 0.4 MG CAPS capsule Take 1 capsule (0.4 mg total) by mouth daily. 90 capsule 1   No current facility-administered medications for this encounter.    Physical Exam: BP (!) 144/90   Pulse 73   Ht 5\' 9"  (1.753 m)   Wt 105.1 kg   BMI 34.23 kg/m   GEN: Well nourished, well developed in no acute distress CARDIAC: Regular rate and rhythm with occasional ectopy, no murmurs, rubs, gallops RESPIRATORY:  Clear to auscultation without rales, wheezing or rhonchi  ABDOMEN: Soft, non-tender, non-distended EXTREMITIES:  No edema; No deformity    Wt Readings from Last 3 Encounters:  05/31/23 105.1 kg  04/24/23 103.9 kg  04/11/23 104.3  kg     EKG today demonstrates  SR, 1st degree AV block, PACs Vent. rate 73 BPM PR interval 224 ms QRS duration 84 ms QT/QTcB 412/453 ms   Echo 04/26/23  1. Left ventricular ejection fraction, by estimation, is 55 to 60%. Left  ventricular ejection fraction by 3D volume is 56 %. The left ventricle has  normal function. The left ventricle has no regional wall motion  abnormalities. Left ventricular diastolic function could not be evaluated.   2. Right ventricular systolic function is normal. The right ventricular  size is normal. There is mildly elevated pulmonary artery systolic  pressure. The estimated right ventricular systolic pressure is 42.3 mmHg.   3. The  mitral valve is abnormal. Mild mitral valve regurgitation.   4. The aortic valve is tricuspid. Aortic valve regurgitation is not  visualized. No aortic stenosis is present.   5. Aortic dilatation noted. There is moderate dilatation of the ascending  aorta, measuring 45 mm.   6. The inferior vena cava is normal in size with <50% respiratory  variability, suggesting right atrial pressure of 8 mmHg.   Comparison(s): No prior Echocardiogram.     CHA2DS2-VASc Score = 3  The patient's score is based upon: CHF History: 0 HTN History: 1 Diabetes History: 0 Stroke History: 0 Vascular Disease History: 0 Age Score: 2 Gender Score: 0       ASSESSMENT AND PLAN: Paroxysmal Atrial Fibrillation (ICD10:  I48.0) The patient's CHA2DS2-VASc score is 3, indicating a 3.2% annual risk of stroke.   Zio monitor showed 14% afib burden, avg HR 98 bpm We discussed rate vs rhythm control today. Patient and son would like to pursue a conservative strategy given his age and paucity of symptoms.  Continue Eliquis  5 mg BID Increase Toprol  to 25 mg daily  Secondary Hypercoagulable State (ICD10:  D68.69) The patient is at significant risk for stroke/thromboembolism based upon his CHA2DS2-VASc Score of 3.  Continue Apixaban  (Eliquis ). No bleeding  issues.   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  Referred to establish care with a primary cardiologist.     Follow up in the AF clinic in 6 months. Also referring to establish care with a primary cardiologist.       Myrtha Ates PA-C [facility] [address] [place], [place] [address] [phone]

## 2023-06-01 ENCOUNTER — Ambulatory Visit

## 2023-06-01 VITALS — Ht 69.0 in | Wt 231.0 lb

## 2023-06-01 DIAGNOSIS — Z0000 Encounter for general adult medical examination without abnormal findings: Secondary | ICD-10-CM | POA: Diagnosis not present

## 2023-06-01 NOTE — Patient Instructions (Signed)
 Jeffrey Carter , Thank you for taking time out of your busy schedule to complete your Annual Wellness Visit with me. I enjoyed our conversation and look forward to speaking with you again next year. I, as well as your care team,  appreciate your ongoing commitment to your health goals. Please review the following plan we discussed and let me know if I can assist you in the future. Your Game plan/ To Do List   Follow up Visits: Next Medicare AWV with our clinical staff: 06/03/2024   Have you seen your provider in the last 6 months (3 months if uncontrolled diabetes)? Yes Next Office Visit with your provider: 07/04/2023  Clinician Recommendations:  Aim for 30 minutes of exercise or brisk walking, 6-8 glasses of water, and 5 servings of fruits and vegetables each day. Educated and advised on getting the Tdap (Tetenus) and COVID vaccines in 2025 at local pharmacy.      This is a list of the screening recommended for you and due dates:  Health Maintenance  Topic Date Due   COVID-19 Vaccine (3 - Pfizer risk series) 05/14/2019   DTaP/Tdap/Td vaccine (3 - Td or Tdap) 11/24/2020   Flu Shot  08/25/2023   Medicare Annual Wellness Visit  05/31/2024   Pneumonia Vaccine  Completed   HPV Vaccine  Aged Out   Meningitis B Vaccine  Aged Out   Colon Cancer Screening  Discontinued   Zoster (Shingles) Vaccine  Discontinued    Advanced directives: (In Chart) A copy of your advanced directives are scanned into your chart should your provider ever need it. Advance Care Planning is important because it:  [x]  Makes sure you receive the medical care that is consistent with your values, goals, and preferences  [x]  It provides guidance to your family and loved ones and reduces their decisional burden about whether or not they are making the right decisions based on your wishes.  Follow the link provided in your after visit summary or read over the paperwork we have mailed to you to help you started getting your  Advance Directives in place. If you need assistance in completing these, please reach out to us  so that we can help you!   Managing Pain Without Opioids Opioids are strong medicines used to treat moderate to severe pain. For some people, especially those who have long-term (chronic) pain, opioids may not be the best choice for pain management due to: Side effects like nausea, constipation, and sleepiness. The risk of addiction (opioid use disorder). The longer you take opioids, the greater your risk of addiction. Pain that lasts for more than 3 months is called chronic pain. Managing chronic pain usually requires more than one approach and is often provided by a team of health care providers working together (multidisciplinary approach). Pain management may be done at a pain management center or pain clinic. How to manage pain without the use of opioids Use non-opioid medicines Non-opioid medicines for pain may include: Over-the-counter or prescription non-steroidal anti-inflammatory drugs (NSAIDs). These may be the first medicines used for pain. They work well for muscle and bone pain, and they reduce swelling. Acetaminophen . This over-the-counter medicine may work well for milder pain but not swelling. Antidepressants. These may be used to treat chronic pain. A certain type of antidepressant (tricyclics) is often used. These medicines are given in lower doses for pain than when used for depression. Anticonvulsants. These are usually used to treat seizures but may also reduce nerve (neuropathic) pain. Muscle relaxants. These  relieve pain caused by sudden muscle tightening (spasms). You may also use a pain medicine that is applied to the skin as a patch, cream, or gel (topical analgesic), such as a numbing medicine. These may cause fewer side effects than medicines taken by mouth. Do certain therapies as directed Some therapies can help with pain management. They include: Physical therapy. You will  do exercises to gain strength and flexibility. A physical therapist may teach you exercises to move and stretch parts of your body that are weak, stiff, or painful. You can learn these exercises at physical therapy visits and practice them at home. Physical therapy may also involve: Massage. Heat wraps or applying heat or cold to affected areas. Electrical signals that interrupt pain signals (transcutaneous electrical nerve stimulation, TENS). Weak lasers that reduce pain and swelling (low-level laser therapy). Signals from your body that help you learn to regulate pain (biofeedback). Occupational therapy. This helps you to learn ways to function at home and work with less pain. Recreational therapy. This involves trying new activities or hobbies, such as a physical activity or drawing. Mental health therapy, including: Cognitive behavioral therapy (CBT). This helps you learn coping skills for dealing with pain. Acceptance and commitment therapy (ACT) to change the way you think and react to pain. Relaxation therapies, including muscle relaxation exercises and mindfulness-based stress reduction. Pain management counseling. This may be individual, family, or group counseling.  Receive medical treatments Medical treatments for pain management include: Nerve block injections. These may include a pain blocker and anti-inflammatory medicines. You may have injections: Near the spine to relieve chronic back or neck pain. Into joints to relieve back or joint pain. Into nerve areas that supply a painful area to relieve body pain. Into muscles (trigger point injections) to relieve some painful muscle conditions. A medical device placed near your spine to help block pain signals and relieve nerve pain or chronic back pain (spinal cord stimulation device). Acupuncture. Follow these instructions at home Medicines Take over-the-counter and prescription medicines only as told by your health care  provider. If you are taking pain medicine, ask your health care providers about possible side effects to watch out for. Do not drive or use heavy machinery while taking prescription opioid pain medicine. Lifestyle  Do not use drugs or alcohol to reduce pain. If you drink alcohol, limit how much you have to: 0-1 drink a day for women who are not pregnant. 0-2 drinks a day for men. Know how much alcohol is in a drink. In the [place] one drink equals one 12 oz bottle of beer (355 mL), one 5 oz glass of wine (148 mL), or one 1 oz glass of hard liquor (44 mL). Do not use any products that contain nicotine or tobacco. These products include cigarettes, chewing tobacco, and vaping devices, such as e-cigarettes. If you need help quitting, ask your health care provider. Eat a healthy diet and maintain a healthy weight. Poor diet and excess weight may make pain worse. Eat foods that are high in fiber. These include fresh fruits and vegetables, whole grains, and beans. Limit foods that are high in fat and processed sugars, such as fried and sweet foods. Exercise regularly. Exercise lowers stress and may help relieve pain. Ask your health care provider what activities and exercises are safe for you. If your health care provider approves, join an exercise class that combines movement and stress reduction. Examples include yoga and tai chi. Get enough sleep. Lack of sleep may make pain  worse. Lower stress as much as possible. Practice stress reduction techniques as told by your therapist. General instructions Work with all your pain management providers to find the treatments that work best for you. You are an important member of your pain management team. There are many things you can do to reduce pain on your own. Consider joining an online or in-person support group for people who have chronic pain. Keep all follow-up visits. This is important. Where to find more information You can find more information  about managing pain without opioids from: American Academy of Pain Medicine: painmed.org Institute for Chronic Pain: instituteforchronicpain.org American Chronic Pain Association: theacpa.org Contact a health care provider if: You have side effects from pain medicine. Your pain gets worse or does not get better with treatments or home therapy. You are struggling with anxiety or depression. Summary Many types of pain can be managed without opioids. Chronic pain may respond better to pain management without opioids. Pain is best managed when you and a team of health care providers work together. Pain management without opioids may include non-opioid medicines, medical treatments, physical therapy, mental health therapy, and lifestyle changes. Tell your health care providers if your pain gets worse or is not being managed well enough. This information is not intended to replace advice given to you by your health care provider. Make sure you discuss any questions you have with your health care provider. Document Revised: 04/22/2020 Document Reviewed: 04/22/2020 Elsevier Patient Education  2024 [employer].

## 2023-06-01 NOTE — Progress Notes (Cosign Needed Addendum)
 Subjective:   Jeffrey Carter is a 83 y.o. who presents for a Medicare Wellness preventive visit.  Visit Complete: Virtual I connected with  Jeffrey Carter on 06/01/23 by a audio enabled telemedicine application and verified that I am speaking with the correct person using two identifiers.  Patient Location: Home  Provider Location: Office/Clinic  I discussed the limitations of evaluation and management by telemedicine. The patient expressed understanding and agreed to proceed.  Vital Signs: Because this visit was a virtual/telehealth visit, some criteria may be missing or patient reported. Any vitals not documented were not able to be obtained and vitals that have been documented are patient reported.  VideoDeclined- This patient declined Librarian, academic. Therefore the visit was completed with audio only.  Persons Participating in Visit: Patient.  AWV Questionnaire: No: Patient Medicare AWV questionnaire was not completed prior to this visit.  Cardiac Risk Factors include: advanced age (>89men, >61 women);dyslipidemia;hypertension;male gender;obesity (BMI >30kg/m2)     Objective:     Today's Vitals   06/01/23 1441  Weight: 231 lb (104.8 kg)  Height: 5\' 9"  (1.753 m)   Body mass index is 34.11 kg/m.     06/01/2023    2:39 PM 04/11/2023   12:49 PM 03/04/2023   11:12 AM 09/08/2020    4:23 PM 06/28/2018    2:20 PM 04/25/2017    3:51 PM 10/12/2016   12:26 AM  Advanced Directives  Does Patient Have a Medical Advance Directive? Yes No No Yes No;Yes No No  Type of Estate agent of [place];Living will    Healthcare Power of [place];Living will    Does patient want to make changes to medical advance directive? No - Patient declined   No - Patient declined     Copy of Healthcare Power of Attorney in Chart? Yes - validated most recent copy scanned in chart (See row information)    No - copy requested    Would patient like  information on creating a medical advance directive?  No - Patient declined No - Patient declined   Yes (ED - Information included in AVS) No - Patient declined    Current Medications (verified) Outpatient Encounter Medications as of 06/01/2023  Medication Sig   acetaminophen  (TYLENOL ) 325 MG tablet Take 2 tablets (650 mg total) by mouth every 8 (eight) hours.   allopurinol  (ZYLOPRIM ) 100 MG tablet Take 0.5 tablets (50 mg total) by mouth daily.   amLODipine  (NORVASC ) 10 MG tablet Take 1 tablet (10 mg total) by mouth daily.   apixaban  (ELIQUIS ) 5 MG TABS tablet Take 1 tablet (5 mg total) by mouth 2 (two) times daily.   cloNIDine  (CATAPRES ) 0.3 MG tablet Take 1 tablet by mouth twice daily   diclofenac  Sodium (VOLTAREN ) 1 % GEL Apply 4 g topically 4 (four) times daily.   diphenoxylate -atropine  (LOMOTIL ) 2.5-0.025 MG tablet Take 1-2 tablets by mouth 4 (four) times daily as needed for diarrhea or loose stools.   EQ ALLERGY RELIEF 10 MG tablet TAKE ONE TABLET BY MOUTH ONCE DAILY   finasteride  (PROSCAR ) 5 MG tablet Take 5 mg by mouth daily.   furosemide  (LASIX ) 40 MG tablet Take 1 tablet (40 mg total) by mouth daily.   HYDROcodone -acetaminophen  (NORCO) 7.5-325 MG tablet Take 1 tablet by mouth every 6 (six) hours as needed for moderate pain (pain score 4-6).   losartan  (COZAAR ) 100 MG tablet Take 1 tablet by mouth once daily   lovastatin  (MEVACOR ) 40 MG tablet TAKE  1 TABLET BY MOUTH ONCE DAILY IN THE EVENING AT  6  PM   methocarbamol  (ROBAXIN ) 500 MG tablet Take 1 tablet (500 mg total) by mouth 2 (two) times daily.   metoprolol  succinate (TOPROL -XL) 25 MG 24 hr tablet Take 1 tablet (25 mg total) by mouth daily.   ondansetron  (ZOFRAN -ODT) 4 MG disintegrating tablet Take 1 tablet (4 mg total) by mouth every 8 (eight) hours as needed for nausea or vomiting.   pantoprazole  (PROTONIX ) 40 MG tablet Take 1 tablet (40 mg total) by mouth daily.   Potassium Chloride  ER 20 MEQ TBCR Take 1 tablet by mouth once  daily   predniSONE  (DELTASONE ) 10 MG tablet TAKE 3 TABLETS BY MOUTH ON DAY 1, THEN 2 TABLETS ON DAY 2 THEN 1 TABLET ON DAY 3 AS NEEDED FOR GOUT ATTACK. TAKE AFTER MEALS   sildenafil  (VIAGRA ) 100 MG tablet Take 1 tablet (100 mg total) by mouth as needed for erectile dysfunction.   tamsulosin  (FLOMAX ) 0.4 MG CAPS capsule Take 1 capsule (0.4 mg total) by mouth daily.   fluticasone  (FLONASE ) 50 MCG/ACT nasal spray Place 2 sprays into both nostrils daily. (Patient not taking: Reported on 06/01/2023)   No facility-administered encounter medications on file as of 06/01/2023.    Allergies (verified) Percocet [oxycodone -acetaminophen ]   History: Past Medical History:  Diagnosis Date   Bladder outlet obstruction    BPH (benign prostatic hyperplasia)    Conjunctivitis, acute, bilateral    07-02-2015  per pcp note and started antibiotic drops   First degree heart block    History of acute conjunctivitis    07-02-2015  resolved after round of antibiotic eye drops   History of gout    BIG TOE   History of urinary retention 12/2014   Hx of adenomatous colonic polyps 07/07/2016   Hyperlipidemia    Hypertension    Lower urinary tract symptoms (LUTS)    OSA on CPAP    MODERATE PER STUDY 03-08-2012   Osteoarthritis    PAC (premature atrial contraction)    Prostate cancer [facility]) urologist-  dr budzyn/  oncologist-  dr Drinda Gentry   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),  Gleason 3+4,  PSA 10.57,  vol //   External beam radiation therapy  08-27-2014 to 10-22-2014   S/P radiation therapy 08-27-2014  to  10-22-2014   prostate 7800Gy in 40 sessions and seminal vesicals 5000Gy in 40 sessions   Wears glasses    Past Surgical History:  Procedure Laterality Date   APPENDECTOMY  1966   COLONOSCOPY  07-06-2006   GOLD SEED IMPLANT N/A 08/12/2014   Procedure: GOLD SEED IMPLANT;  Surgeon: Jock Muller, MD;  Location[facility];  Service: Urology;  Laterality: N/A;   GREEN LIGHT LASER TURP (TRANSURETHRAL  RESECTION OF PROSTATE N/A 07/20/2015   Procedure: GREEN LIGHT LASER ABLATION OF PROSTATE ;  Surgeon: Bart Born, MD;  Location: [facility];  Service: Urology;  Laterality: N/A;   PROSTATE BIOPSY N/A 03/28/2014   Procedure: BIOPSY TRANSRECTAL ULTRASONIC PROSTATE (TUBP);  Surgeon: Arleen Bells, MD;  Location: [facility];  Service: Urology;  Laterality: N/A;   TONSILLECTOMY  as child   TOTAL KNEE ARTHROPLASTY Right 06-22-2007   Family History  Problem Relation Age of Onset   Diabetes Father    Cancer Brother        pancreatic, stomach   Colon cancer Neg Hx    Social History   Socioeconomic History   Marital status:  Married    Spouse name: Not on file   Number of children: 4   Years of education: Not on file   Highest education level: Not on file  Occupational History   Occupation: retired  Tobacco Use   Smoking status: Former    Current packs/day: 0.00    Average packs/day: 0.3 packs/day for 8.0 years (2.0 ttl pk-yrs)    Types: Cigarettes    Start date: 01/24/1954    Quit date: 01/24/1962    Years since quitting: 61.3    Passive exposure: Past   Smokeless tobacco: Never   Tobacco comments:    Former smoker 04/24/23  Vaping Use   Vaping status: Never Used  Substance and Sexual Activity   Alcohol use: No   Drug use: No   Sexual activity: Yes    Partners: Female  Other Topics Concern   Not on file  Social History Narrative   Married 8 years- divorced- married '85   2 sons- '72, '74; 1 daughter '39   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, English as a foreign language   Social Drivers of Corporate investment banker Strain: Low Risk  (06/01/2023)   Overall Financial Resource Strain (CARDIA)    Difficulty of Paying Living Expenses: Not hard at all  Food Insecurity: No Food Insecurity (06/01/2023)   Hunger Vital Sign    Worried About Running Out of Food in the Last Year: Never true    Ran Out of Food in the Last Year: Never true  Transportation  Needs: No Transportation Needs (06/01/2023)   PRAPARE - Administrator, Civil Service (Medical): No    Lack of Transportation (Non-Medical): No  Physical Activity: Sufficiently Active (06/01/2023)   Exercise Vital Sign    Days of Exercise per Week: 5 days    Minutes of Exercise per Session: 40 min  Stress: No Stress Concern Present (06/01/2023)   [employer] of Occupational Health - Occupational Stress Questionnaire    Feeling of Stress : Not at all  Social Connections: Socially Integrated (06/01/2023)   Social Connection and Isolation Panel [NHANES]    Frequency of Communication with Friends and Family: More than three times a week    Frequency of Social Gatherings with Friends and Family: More than three times a week    Attends Religious Services: 1 to 4 times per year    Active Member of [employer] or Organizations: Yes    Attends Banker Meetings: 1 to 4 times per year    Marital Status: Married    Tobacco Counseling Counseling given: No Tobacco comments: Former smoker 04/24/23    Clinical Intake:  Pre-visit preparation completed: Yes  Pain : No/denies pain     BMI - recorded: 34.11 Nutritional Risks: None Diabetes: No  Lab Results  Component Value Date   HGBA1C 6.2 06/14/2021   HGBA1C 5.7 12/08/2020   HGBA1C 5.6 09/08/2020   HGBA1C 5.6 09/08/2020   HGBA1C 5.6 (A) 09/08/2020   HGBA1C 5.6 09/08/2020     How often do you need to have someone help you when you read instructions, pamphlets, or other written materials from your doctor or pharmacy?: 1 - Never  Interpreter Needed?: No  Information entered by :: Kandy Orris, CMA   Activities of Daily Living     06/01/2023    2:43 PM  In your present state of health, do you have any difficulty performing the following activities:  Hearing? 0  Vision? 0  Difficulty concentrating  or making decisions? 0  Walking or climbing stairs? 0  Dressing or bathing? 0  Doing errands, shopping? 0   Preparing Food and eating ? N  Using the Toilet? N  In the past six months, have you accidently leaked urine? Y  Comment wears a depend  Do you have problems with loss of bowel control? N  Managing your Medications? N  Managing your Finances? N  Housekeeping or managing your Housekeeping? N    Patient Care Team: Plotnikov, Oakley Bellman, MD as PCP - General (Internal Medicine) Marlena Sima, MD (Orthopedic Surgery) Bart Born, MD (Inactive) as Consulting Physician (Urology) Wilder Handy, MD (Inactive) as Consulting Physician (Pulmonary Disease) Celia Coles Angus Kenning, DPM as Consulting Physician (Podiatry) Thurmon [place]., MD as Consulting Physician (Ophthalmology)  Indicate any recent Medical Services you may have received from other than [facility] providers in the past year (date may be approximate).     Assessment:    This is a routine wellness examination for [place].  Hearing/Vision screen Hearing Screening - Comments:: Denies hearing difficulties   Vision Screening - Comments:: Wears rx glasses - sees Dr Rachel Budds (needs to schedule an appt)   Goals Addressed               This Visit's Progress     Patient Stated (pt-stated)        Patient stated he plans to stay active.       Depression Screen     06/01/2023    2:49 PM 04/03/2023    2:34 PM 12/08/2022    1:34 PM 12/01/2022    2:54 PM 05/31/2022    2:25 PM 01/31/2022    9:57 AM 09/14/2021    1:27 PM  PHQ 2/9 Scores  PHQ - 2 Score 0 0 0 0 0 0 0  PHQ- 9 Score 0      4    Fall Risk     06/01/2023    2:45 PM 04/03/2023    2:34 PM 12/28/2022    1:36 PM 12/08/2022    1:33 PM 12/01/2022    2:53 PM  Fall Risk   Falls in the past year? 0 0 0 0 0  Number falls in past yr: 0 0 0 0 0  Injury with Fall? 0 0 0 0 0  Risk for fall due to : No Fall Risks No Fall Risks No Fall Risks No Fall Risks No Fall Risks  Follow up Falls prevention discussed;Falls evaluation completed Falls evaluation completed Falls evaluation completed Falls  evaluation completed Falls evaluation completed    MEDICARE RISK AT HOME:  Medicare Risk at Home Any stairs in or around the home?: No If so, are there any without handrails?: No Home free of loose throw rugs in walkways, pet beds, electrical cords, etc?: Yes Adequate lighting in your home to reduce risk of falls?: Yes Life alert?: No Use of a cane, walker or w/c?: Yes (cane) Grab bars in the bathroom?: No Shower chair or bench in shower?: No Elevated toilet seat or a handicapped toilet?: No  TIMED UP AND GO:  Was the test performed?  No  Cognitive Function: 6CIT completed    04/25/2017    3:52 PM 03/21/2016    1:30 PM  MMSE - Mini Mental State Exam  Orientation to time 5 5  Orientation to Place 5 5  Registration 3 3  Attention/ Calculation 5 5  Recall 1 1  Language- name 2 objects 2 2  Language- repeat  1 1  Language- follow 3 step command 3 3  Language- read & follow direction 1 1  Write a sentence 1 1  Copy design 1 1  Total score 28 28        06/01/2023    2:50 PM  6CIT Screen  What Year? 0 points  What month? 0 points  What time? 0 points  Count back from 20 0 points  Months in reverse 0 points  Repeat phrase 0 points  Total Score 0 points    Immunizations Immunization History  Administered Date(s) Administered   Fluad Quad(high Dose 65+) 10/03/2018, 11/07/2019, 10/05/2020, 10/07/2021   Influenza Split 11/25/2010, 11/03/2011   Influenza Whole 10/24/2007, 10/30/2008, 09/24/2009   Influenza, High Dose Seasonal PF 09/22/2015, 10/18/2016, 10/17/2017   Influenza,inj,Quad PF,6+ Mos 10/17/2012, 10/11/2013, 10/09/2014   PFIZER(Purple Top)SARS-COV-2 Vaccination 03/21/2019, 04/16/2019   PNEUMOCOCCAL CONJUGATE-20 01/31/2022   PPD Test 10/29/2013, 10/29/2013   Pneumococcal Conjugate-13 02/20/2013   Pneumococcal Polysaccharide-23 10/30/2008, 10/12/2015   Td 10/30/2008   Tdap 11/25/2010   Zoster, Live 03/13/2013    Screening Tests Health Maintenance  Topic  Date Due   COVID-19 Vaccine (3 - Pfizer risk series) 05/14/2019   DTaP/Tdap/Td (3 - Td or Tdap) 11/24/2020   INFLUENZA VACCINE  08/25/2023   Medicare Annual Wellness (AWV)  05/31/2024   Pneumonia Vaccine 51+ Years old  Completed   HPV VACCINES  Aged Out   Meningococcal B Vaccine  Aged Out   Colonoscopy  Discontinued   Zoster Vaccines- Shingrix  Discontinued    Health Maintenance  Health Maintenance Due  Topic Date Due   COVID-19 Vaccine (3 - Pfizer risk series) 05/14/2019   DTaP/Tdap/Td (3 - Td or Tdap) 11/24/2020   Health Maintenance Items Addressed: 06/01/2023   Additional Screening:  Vision Screening: Recommended annual ophthalmology exams for early detection of glaucoma and other disorders of the eye.  Dental Screening: Recommended annual dental exams for proper oral hygiene  Community Resource Referral / Chronic Care Management: CRR required this visit?  No   CCM required this visit?  No   Plan:    I have personally reviewed and noted the following in the patient's chart:   Medical and social history Use of alcohol, tobacco or illicit drugs  Current medications and supplements including opioid prescriptions. Patient is currently taking opioid prescriptions. Information provided to patient regarding non-opioid alternatives. Patient advised to discuss non-opioid treatment plan with their provider. Functional ability and status Nutritional status Physical activity Advanced directives List of other physicians Hospitalizations, surgeries, and ER visits in previous 12 months Vitals Screenings to include cognitive, depression, and falls Referrals and appointments  In addition, I have reviewed and discussed with patient certain preventive protocols, quality metrics, and best practice recommendations. A written personalized care plan for preventive services as well as general preventive health recommendations were provided to patient.   Patria Bookbinder, CMA   06/01/2023    After Visit Summary: (MyChart) Due to this being a telephonic visit, the after visit summary with patients personalized plan was offered to patient via MyChart   Notes: Nothing significant to report at this time.  Medical screening examination/treatment/procedure(s) were performed by non-physician practitioner and as supervising physician I was immediately available for consultation/collaboration.  I agree with above. Adelaide Holy, MD

## 2023-06-28 ENCOUNTER — Other Ambulatory Visit: Payer: Self-pay | Admitting: Internal Medicine

## 2023-07-04 ENCOUNTER — Ambulatory Visit (INDEPENDENT_AMBULATORY_CARE_PROVIDER_SITE_OTHER): Admitting: Internal Medicine

## 2023-07-04 ENCOUNTER — Encounter: Payer: Self-pay | Admitting: Internal Medicine

## 2023-07-04 VITALS — BP 114/68 | HR 79 | Temp 98.6°F | Ht 69.0 in | Wt 232.0 lb

## 2023-07-04 DIAGNOSIS — R739 Hyperglycemia, unspecified: Secondary | ICD-10-CM | POA: Diagnosis not present

## 2023-07-04 DIAGNOSIS — I480 Paroxysmal atrial fibrillation: Secondary | ICD-10-CM | POA: Diagnosis not present

## 2023-07-04 DIAGNOSIS — M545 Low back pain, unspecified: Secondary | ICD-10-CM | POA: Diagnosis not present

## 2023-07-04 DIAGNOSIS — I10 Essential (primary) hypertension: Secondary | ICD-10-CM

## 2023-07-04 DIAGNOSIS — M1000 Idiopathic gout, unspecified site: Secondary | ICD-10-CM

## 2023-07-04 DIAGNOSIS — M79604 Pain in right leg: Secondary | ICD-10-CM

## 2023-07-04 MED ORDER — PREDNISONE 10 MG PO TABS: ORAL_TABLET | ORAL | 1 refills | Status: DC

## 2023-07-04 NOTE — Assessment & Plan Note (Signed)
 Stable

## 2023-07-04 NOTE — Assessment & Plan Note (Signed)
 Stable Norco prn rare  Potential benefits of a short/long term opioids use as well as potential risks (i.e. addiction risk, apnea etc) and complications (i.e. Somnolence, constipation and others) were explained to the patient and were aknowledged.

## 2023-07-04 NOTE — Assessment & Plan Note (Signed)
 Rekcurrent. Prednisone  po prn - see Rx, Norco prn. Use prn. Allopurinol  option was discussed - pt agreed  Potential benefits of a short term opioids use as well as potential risks (i.e. addiction risk, apnea etc) and complications (i.e. Somnolence, constipation and others) were explained to the patient and were aknowledged.

## 2023-07-04 NOTE — Assessment & Plan Note (Signed)
 Better Cont w/blood pressure meds -  Amlodipine , Catapress, Furosemide , Losartan 

## 2023-07-04 NOTE — Progress Notes (Signed)
 Subjective:  Patient ID: Jeffrey Carter, male    DOB: December 25, 1940  Age: 83 y.o. MRN: [id number]  CC: Medical Management of Chronic Issues (3 mnth f/u)   HPI EVART MCDONNELL presents for A fib, gout, HTN  Outpatient Medications Prior to Visit  Medication Sig Dispense Refill   acetaminophen  (TYLENOL ) 325 MG tablet Take 2 tablets (650 mg total) by mouth every 8 (eight) hours. 30 tablet 0   allopurinol  (ZYLOPRIM ) 100 MG tablet Take 0.5 tablets (50 mg total) by mouth daily. 45 tablet 3   amLODipine  (NORVASC ) 10 MG tablet Take 1 tablet (10 mg total) by mouth daily. 90 tablet 3   apixaban  (ELIQUIS ) 5 MG TABS tablet Take 1 tablet (5 mg total) by mouth 2 (two) times daily. 60 tablet 4   cloNIDine  (CATAPRES ) 0.3 MG tablet Take 1 tablet by mouth twice daily 180 tablet 0   diclofenac  Sodium (VOLTAREN ) 1 % GEL Apply 4 g topically 4 (four) times daily. 100 g 0   diphenoxylate -atropine  (LOMOTIL ) 2.5-0.025 MG tablet Take 1-2 tablets by mouth 4 (four) times daily as needed for diarrhea or loose stools. 60 tablet 0   EQ ALLERGY RELIEF 10 MG tablet TAKE ONE TABLET BY MOUTH ONCE DAILY 90 tablet 3   finasteride  (PROSCAR ) 5 MG tablet Take 5 mg by mouth daily.     furosemide  (LASIX ) 40 MG tablet Take 1 tablet (40 mg total) by mouth daily. 90 tablet 3   HYDROcodone -acetaminophen  (NORCO) 7.5-325 MG tablet Take 1 tablet by mouth every 6 (six) hours as needed for moderate pain (pain score 4-6). 20 tablet 0   losartan  (COZAAR ) 100 MG tablet Take 1 tablet by mouth once daily 90 tablet 3   lovastatin  (MEVACOR ) 40 MG tablet TAKE 1 TABLET BY MOUTH ONCE DAILY IN THE EVENING AT  6PM 90 tablet 2   methocarbamol  (ROBAXIN ) 500 MG tablet Take 1 tablet (500 mg total) by mouth 2 (two) times daily. 10 tablet 0   metoprolol  succinate (TOPROL -XL) 25 MG 24 hr tablet Take 1 tablet (25 mg total) by mouth daily. 30 tablet 4   ondansetron  (ZOFRAN -ODT) 4 MG disintegrating tablet Take 1 tablet (4 mg total) by mouth every 8 (eight)  hours as needed for nausea or vomiting. 20 tablet 0   pantoprazole  (PROTONIX ) 40 MG tablet Take 1 tablet (40 mg total) by mouth daily. 30 tablet 1   Potassium Chloride  ER 20 MEQ TBCR Take 1 tablet by mouth once daily 90 tablet 3   sildenafil  (VIAGRA ) 100 MG tablet Take 1 tablet (100 mg total) by mouth as needed for erectile dysfunction. 12 tablet 5   tamsulosin  (FLOMAX ) 0.4 MG CAPS capsule Take 1 capsule (0.4 mg total) by mouth daily. 90 capsule 1   predniSONE  (DELTASONE ) 10 MG tablet TAKE 3 TABLETS BY MOUTH ON DAY 1, THEN 2 TABLETS ON DAY 2 THEN 1 TABLET ON DAY 3 AS NEEDED FOR GOUT ATTACK. TAKE AFTER MEALS 18 tablet 1   fluticasone  (FLONASE ) 50 MCG/ACT nasal spray Place 2 sprays into both nostrils daily. (Patient not taking: Reported on 07/04/2023) 16 g 2   No facility-administered medications prior to visit.    ROS: Review of Systems  Constitutional:  Positive for fatigue. Negative for appetite change and unexpected weight change.  HENT:  Negative for congestion, nosebleeds, sneezing, sore throat and trouble swallowing.   Eyes:  Negative for itching and visual disturbance.  Respiratory:  Negative for cough.   Cardiovascular:  Negative for chest pain, palpitations  and leg swelling.  Gastrointestinal:  Negative for abdominal distention, blood in stool, diarrhea and nausea.  Genitourinary:  Negative for frequency and hematuria.  Musculoskeletal:  Positive for arthralgias, back pain, gait problem and neck stiffness. Negative for joint swelling and neck pain.  Skin:  Negative for rash.  Neurological:  Negative for dizziness, tremors, speech difficulty and weakness.  Psychiatric/Behavioral:  Negative for agitation, dysphoric mood and sleep disturbance. The patient is not nervous/anxious.   Occ gout attacks  Objective:  BP 114/68   Pulse 79   Temp 98.6 F (37 C) (Oral)   Ht 5\' 9"  (1.753 m)   Wt 232 lb (105.2 kg)   SpO2 93%   BMI 34.26 kg/m   BP Readings from Last 3 Encounters:   07/04/23 114/68  05/31/23 (!) 144/90  04/24/23 (!) 144/100    Wt Readings from Last 3 Encounters:  07/04/23 232 lb (105.2 kg)  06/01/23 231 lb (104.8 kg)  05/31/23 231 lb 12.8 oz (105.1 kg)    Physical Exam Constitutional:      General: He is not in acute distress.    Appearance: He is well-developed. He is obese.     Comments: NAD  Eyes:     Conjunctiva/sclera: Conjunctivae normal.     Pupils: Pupils are equal, round, and reactive to light.  Neck:     Thyroid : No thyromegaly.     Vascular: No JVD.  Cardiovascular:     Rate and Rhythm: Normal rate. Rhythm irregular.     Heart sounds: Normal heart sounds. No murmur heard.    No friction rub. No gallop.  Pulmonary:     Effort: Pulmonary effort is normal. No respiratory distress.     Breath sounds: Normal breath sounds. No wheezing or rales.  Chest:     Chest wall: No tenderness.  Abdominal:     General: Bowel sounds are normal. There is no distension.     Palpations: Abdomen is soft. There is no mass.     Tenderness: There is no abdominal tenderness. There is no guarding or rebound.  Musculoskeletal:        General: No tenderness. Normal range of motion.     Cervical back: Normal range of motion.  Lymphadenopathy:     Cervical: No cervical adenopathy.  Skin:    General: Skin is warm and dry.     Findings: No rash.  Neurological:     Mental Status: He is alert and oriented to person, place, and time.     Cranial Nerves: No cranial nerve deficit.     Motor: No abnormal muscle tone.     Coordination: Coordination normal.     Gait: Gait normal.     Deep Tendon Reflexes: Reflexes are normal and symmetric.  Psychiatric:        Behavior: Behavior normal.        Thought Content: Thought content normal.        Judgment: Judgment normal.     Lab Results  Component Value Date   WBC 7.2 04/11/2023   HGB 12.3 (L) 04/11/2023   HCT 36.6 (L) 04/11/2023   PLT 245 04/11/2023   GLUCOSE 135 (H) 04/11/2023   CHOL 131  10/15/2019   TRIG 67 10/15/2019   HDL 43 10/15/2019   LDLCALC 74 10/15/2019   ALT 7 04/03/2023   AST 10 04/03/2023   NA 136 04/11/2023   K 3.1 (L) 04/11/2023   CL 102 04/11/2023   CREATININE 1.01 04/11/2023   BUN  11 04/11/2023   CO2 28 04/11/2023   TSH 1.63 04/03/2023   PSA 0.17 04/03/2023   INR 1.01 10/12/2014   HGBA1C 6.2 06/14/2021    No results found.  Assessment & Plan:   Problem List Items Addressed This Visit     Essential hypertension   Better Cont w/blood pressure meds -  Amlodipine , Catapress, Furosemide , Losartan       Relevant Orders   Comprehensive metabolic panel with GFR   Low back pain radiating to right lower extremity   Stable Norco prn rare  Potential benefits of a short/long term opioids use as well as potential risks (i.e. addiction risk, apnea etc) and complications (i.e. Somnolence, constipation and others) were explained to the patient and were aknowledged.       Relevant Medications   predniSONE  (DELTASONE ) 10 MG tablet   Gout attack - Primary   Rekcurrent. Prednisone  po prn - see Rx, Norco prn. Use prn. Allopurinol  option was discussed - pt agreed  Potential benefits of a short term opioids use as well as potential risks (i.e. addiction risk, apnea etc) and complications (i.e. Somnolence, constipation and others) were explained to the patient and were aknowledged.      Hyperglycemia   Relevant Orders   Hemoglobin A1c   Comprehensive metabolic panel with GFR   Paroxysmal atrial fibrillation (HCC)   Stable          Meds ordered this encounter  Medications   predniSONE  (DELTASONE ) 10 MG tablet    Sig: TAKE 3 TABLETS BY MOUTH ON DAY 1, THEN 2 TABLETS ON DAY 2 THEN 1 TABLET ON DAY 3 AS NEEDED FOR GOUT ATTACK. TAKE AFTER MEALS    Dispense:  18 tablet    Refill:  1      Follow-up: Return in about 3 months (around 10/04/2023) for a follow-up visit.  Anitra Barn, MD

## 2023-07-08 ENCOUNTER — Other Ambulatory Visit: Payer: Self-pay

## 2023-07-08 ENCOUNTER — Emergency Department (HOSPITAL_BASED_OUTPATIENT_CLINIC_OR_DEPARTMENT_OTHER)

## 2023-07-08 ENCOUNTER — Emergency Department (HOSPITAL_COMMUNITY)

## 2023-07-08 ENCOUNTER — Inpatient Hospital Stay (HOSPITAL_COMMUNITY)
Admission: EM | Admit: 2023-07-08 | Discharge: 2023-07-14 | DRG: 554 | Disposition: A | Discharge status: Skilled Nursing Facility | Attending: Family Medicine | Admitting: Family Medicine

## 2023-07-08 ENCOUNTER — Encounter (HOSPITAL_COMMUNITY): Payer: Self-pay

## 2023-07-08 DIAGNOSIS — G4733 Obstructive sleep apnea (adult) (pediatric): Secondary | ICD-10-CM | POA: Diagnosis present

## 2023-07-08 DIAGNOSIS — Z79899 Other long term (current) drug therapy: Secondary | ICD-10-CM | POA: Diagnosis not present

## 2023-07-08 DIAGNOSIS — Z87891 Personal history of nicotine dependence: Secondary | ICD-10-CM | POA: Diagnosis not present

## 2023-07-08 DIAGNOSIS — M109 Gout, unspecified: Secondary | ICD-10-CM | POA: Diagnosis not present

## 2023-07-08 DIAGNOSIS — N179 Acute kidney failure, unspecified: Secondary | ICD-10-CM | POA: Diagnosis not present

## 2023-07-08 DIAGNOSIS — Z96651 Presence of right artificial knee joint: Secondary | ICD-10-CM | POA: Diagnosis present

## 2023-07-08 DIAGNOSIS — M25462 Effusion, left knee: Principal | ICD-10-CM | POA: Diagnosis present

## 2023-07-08 DIAGNOSIS — M25562 Pain in left knee: Secondary | ICD-10-CM | POA: Diagnosis not present

## 2023-07-08 DIAGNOSIS — Z8546 Personal history of malignant neoplasm of prostate: Secondary | ICD-10-CM

## 2023-07-08 DIAGNOSIS — Z860101 Personal history of adenomatous and serrated colon polyps: Secondary | ICD-10-CM

## 2023-07-08 DIAGNOSIS — Z885 Allergy status to narcotic agent status: Secondary | ICD-10-CM | POA: Diagnosis not present

## 2023-07-08 DIAGNOSIS — E785 Hyperlipidemia, unspecified: Secondary | ICD-10-CM | POA: Diagnosis not present

## 2023-07-08 DIAGNOSIS — I10 Essential (primary) hypertension: Secondary | ICD-10-CM | POA: Diagnosis present

## 2023-07-08 DIAGNOSIS — E876 Hypokalemia: Secondary | ICD-10-CM | POA: Diagnosis not present

## 2023-07-08 DIAGNOSIS — M7989 Other specified soft tissue disorders: Secondary | ICD-10-CM | POA: Diagnosis not present

## 2023-07-08 DIAGNOSIS — I700 Atherosclerosis of aorta: Secondary | ICD-10-CM | POA: Diagnosis present

## 2023-07-08 DIAGNOSIS — D649 Anemia, unspecified: Secondary | ICD-10-CM

## 2023-07-08 DIAGNOSIS — R609 Edema, unspecified: Secondary | ICD-10-CM | POA: Diagnosis not present

## 2023-07-08 DIAGNOSIS — I480 Paroxysmal atrial fibrillation: Secondary | ICD-10-CM | POA: Diagnosis present

## 2023-07-08 DIAGNOSIS — Z833 Family history of diabetes mellitus: Secondary | ICD-10-CM | POA: Diagnosis not present

## 2023-07-08 DIAGNOSIS — I340 Nonrheumatic mitral (valve) insufficiency: Secondary | ICD-10-CM | POA: Diagnosis not present

## 2023-07-08 DIAGNOSIS — Z923 Personal history of irradiation: Secondary | ICD-10-CM

## 2023-07-08 DIAGNOSIS — Z800 Family history of malignant neoplasm of digestive organs: Secondary | ICD-10-CM

## 2023-07-08 DIAGNOSIS — I719 Aortic aneurysm of unspecified site, without rupture: Secondary | ICD-10-CM | POA: Diagnosis present

## 2023-07-08 DIAGNOSIS — Z7901 Long term (current) use of anticoagulants: Secondary | ICD-10-CM | POA: Diagnosis not present

## 2023-07-08 DIAGNOSIS — M1712 Unilateral primary osteoarthritis, left knee: Principal | ICD-10-CM | POA: Diagnosis present

## 2023-07-08 DIAGNOSIS — Z6834 Body mass index (BMI) 34.0-34.9, adult: Secondary | ICD-10-CM

## 2023-07-08 DIAGNOSIS — Z9079 Acquired absence of other genital organ(s): Secondary | ICD-10-CM

## 2023-07-08 DIAGNOSIS — E66811 Obesity, class 1: Secondary | ICD-10-CM | POA: Diagnosis present

## 2023-07-08 DIAGNOSIS — G8929 Other chronic pain: Secondary | ICD-10-CM | POA: Diagnosis present

## 2023-07-08 DIAGNOSIS — N400 Enlarged prostate without lower urinary tract symptoms: Secondary | ICD-10-CM | POA: Diagnosis present

## 2023-07-08 LAB — SYNOVIAL CELL COUNT + DIFF, W/ CRYSTALS
Crystals, Fluid: NONE SEEN
Eosinophils-Synovial: 0 % (ref 0–1)
Lymphocytes-Synovial Fld: 0 % (ref 0–20)
Monocyte-Macrophage-Synovial Fluid: 5 % — ABNORMAL LOW (ref 50–90)
Neutrophil, Synovial: 95 % — ABNORMAL HIGH (ref 0–25)
Other Cells-SYN: 0
WBC, Synovial: 5225 /mm3 — ABNORMAL HIGH (ref 0–200)

## 2023-07-08 LAB — CBC WITH DIFFERENTIAL/PLATELET
Abs Immature Granulocytes: 0.01 10*3/uL (ref 0.00–0.07)
Basophils Absolute: 0 10*3/uL (ref 0.0–0.1)
Basophils Relative: 0 %
Eosinophils Absolute: 0.1 10*3/uL (ref 0.0–0.5)
Eosinophils Relative: 2 %
HCT: 36 % — ABNORMAL LOW (ref 39.0–52.0)
Hemoglobin: 11.7 g/dL — ABNORMAL LOW (ref 13.0–17.0)
Immature Granulocytes: 0 %
Lymphocytes Relative: 22 %
Lymphs Abs: 1.4 10*3/uL (ref 0.7–4.0)
MCH: 26.4 pg (ref 26.0–34.0)
MCHC: 32.5 g/dL (ref 30.0–36.0)
MCV: 81.3 fL (ref 80.0–100.0)
Monocytes Absolute: 0.8 10*3/uL (ref 0.1–1.0)
Monocytes Relative: 12 %
Neutro Abs: 4.1 10*3/uL (ref 1.7–7.7)
Neutrophils Relative %: 64 %
Platelets: 217 10*3/uL (ref 150–400)
RBC: 4.43 MIL/uL (ref 4.22–5.81)
RDW: 15.6 % — ABNORMAL HIGH (ref 11.5–15.5)
WBC: 6.4 10*3/uL (ref 4.0–10.5)
nRBC: 0 % (ref 0.0–0.2)

## 2023-07-08 LAB — BASIC METABOLIC PANEL WITH GFR
Anion gap: 10 (ref 5–15)
BUN: 12 mg/dL (ref 8–23)
CO2: 25 mmol/L (ref 22–32)
Calcium: 8.8 mg/dL — ABNORMAL LOW (ref 8.9–10.3)
Chloride: 103 mmol/L (ref 98–111)
Creatinine, Ser: 0.76 mg/dL (ref 0.61–1.24)
GFR, Estimated: >60 mL/min (ref >60)
Glucose, Bld: 113 mg/dL — ABNORMAL HIGH (ref 70–99)
Potassium: 3.2 mmol/L — ABNORMAL LOW (ref 3.5–5.1)
Sodium: 138 mmol/L (ref 135–145)

## 2023-07-08 LAB — URIC ACID: Uric Acid, Serum: 5.5 mg/dL (ref 3.7–8.6)

## 2023-07-08 LAB — MAGNESIUM: Magnesium: 2 mg/dL (ref 1.7–2.4)

## 2023-07-08 LAB — TSH: TSH: 0.602 u[IU]/mL (ref 0.350–4.500)

## 2023-07-08 MED ORDER — LIDOCAINE-EPINEPHRINE 2 %-1:100000 IJ SOLN
20.0000 mL | Freq: Once | INTRAMUSCULAR | Status: AC
  Administered 2023-07-08: 20 mL via INTRADERMAL
  Filled 2023-07-08: qty 1

## 2023-07-08 MED ORDER — LOSARTAN POTASSIUM 50 MG PO TABS: 100.0000 mg | ORAL_TABLET | Freq: Every day | ORAL | Status: DC

## 2023-07-08 MED ORDER — SENNOSIDES-DOCUSATE SODIUM 8.6-50 MG PO TABS
1.0000 | ORAL_TABLET | Freq: Every evening | ORAL | Status: DC | PRN
  Administered 2023-07-13 – 2023-07-14 (×2): 1 via ORAL
  Filled 2023-07-08 (×2): qty 1

## 2023-07-08 MED ORDER — ACETAMINOPHEN 650 MG RE SUPP: 650.0000 mg | Freq: Four times a day (QID) | RECTAL | Status: DC | PRN

## 2023-07-08 MED ORDER — ONDANSETRON HCL 4 MG PO TABS: 4.0000 mg | ORAL_TABLET | Freq: Four times a day (QID) | ORAL | Status: DC | PRN

## 2023-07-08 MED ORDER — MORPHINE SULFATE (PF) 4 MG/ML IV SOLN
4.0000 mg | Freq: Once | INTRAVENOUS | Status: AC
  Administered 2023-07-08: 4 mg via INTRAVENOUS
  Filled 2023-07-08: qty 1

## 2023-07-08 MED ORDER — METOPROLOL SUCCINATE ER 25 MG PO TB24
25.0000 mg | ORAL_TABLET | Freq: Every evening | ORAL | Status: DC
  Administered 2023-07-08: 25 mg via ORAL
  Filled 2023-07-08: qty 1

## 2023-07-08 MED ORDER — FUROSEMIDE 40 MG PO TABS
40.0000 mg | ORAL_TABLET | Freq: Every day | ORAL | Status: DC
  Administered 2023-07-09 – 2023-07-11 (×3): 40 mg via ORAL
  Filled 2023-07-08 (×3): qty 1

## 2023-07-08 MED ORDER — PRAVASTATIN SODIUM 40 MG PO TABS
40.0000 mg | ORAL_TABLET | Freq: Every day | ORAL | Status: DC
  Administered 2023-07-09 – 2023-07-14 (×6): 40 mg via ORAL
  Filled 2023-07-08 (×4): qty 2
  Filled 2023-07-08: qty 1
  Filled 2023-07-08 (×2): qty 2

## 2023-07-08 MED ORDER — METHOCARBAMOL 500 MG PO TABS
500.0000 mg | ORAL_TABLET | Freq: Two times a day (BID) | ORAL | Status: DC | PRN
  Administered 2023-07-08: 500 mg via ORAL
  Filled 2023-07-08: qty 1

## 2023-07-08 MED ORDER — APIXABAN 5 MG PO TABS
5.0000 mg | ORAL_TABLET | Freq: Two times a day (BID) | ORAL | Status: DC
  Administered 2023-07-08 – 2023-07-12 (×8): 5 mg via ORAL
  Filled 2023-07-08 (×8): qty 1

## 2023-07-08 MED ORDER — AMLODIPINE BESYLATE 10 MG PO TABS: 10.0000 mg | ORAL_TABLET | Freq: Every day | ORAL | Status: DC

## 2023-07-08 MED ORDER — POTASSIUM CHLORIDE CRYS ER 20 MEQ PO TBCR
20.0000 meq | EXTENDED_RELEASE_TABLET | Freq: Every day | ORAL | Status: DC
  Administered 2023-07-08: 20 meq via ORAL
  Filled 2023-07-08: qty 1

## 2023-07-08 MED ORDER — LORATADINE 10 MG PO TABS
10.0000 mg | ORAL_TABLET | Freq: Every evening | ORAL | Status: DC
  Administered 2023-07-08 – 2023-07-14 (×7): 10 mg via ORAL
  Filled 2023-07-08 (×7): qty 1

## 2023-07-08 MED ORDER — POTASSIUM CHLORIDE CRYS ER 20 MEQ PO TBCR
40.0000 meq | EXTENDED_RELEASE_TABLET | Freq: Once | ORAL | Status: AC
  Administered 2023-07-08: 40 meq via ORAL
  Filled 2023-07-08: qty 2

## 2023-07-08 MED ORDER — CLONIDINE HCL 0.1 MG PO TABS: 0.3000 mg | ORAL_TABLET | Freq: Two times a day (BID) | ORAL | Status: DC

## 2023-07-08 MED ORDER — TAMSULOSIN HCL 0.4 MG PO CAPS
0.4000 mg | ORAL_CAPSULE | Freq: Every day | ORAL | Status: DC
  Administered 2023-07-09 – 2023-07-14 (×6): 0.4 mg via ORAL
  Filled 2023-07-08 (×6): qty 1

## 2023-07-08 MED ORDER — ONDANSETRON HCL 4 MG/2ML IJ SOLN: 4.0000 mg | Freq: Four times a day (QID) | INTRAMUSCULAR | Status: DC | PRN

## 2023-07-08 MED ORDER — HYDRALAZINE HCL 20 MG/ML IJ SOLN
5.0000 mg | Freq: Three times a day (TID) | INTRAMUSCULAR | Status: DC | PRN
  Administered 2023-07-08 – 2023-07-09 (×2): 5 mg via INTRAVENOUS
  Filled 2023-07-08 (×2): qty 1

## 2023-07-08 MED ORDER — MORPHINE SULFATE (PF) 2 MG/ML IV SOLN
2.0000 mg | INTRAVENOUS | Status: DC | PRN
  Administered 2023-07-09 (×2): 2 mg via INTRAVENOUS
  Filled 2023-07-08 (×2): qty 1

## 2023-07-08 MED ORDER — MORPHINE SULFATE 15 MG PO TABS
15.0000 mg | ORAL_TABLET | ORAL | Status: DC | PRN
  Administered 2023-07-08: 15 mg via ORAL
  Filled 2023-07-08: qty 1

## 2023-07-08 MED ORDER — HYDROCODONE-ACETAMINOPHEN 7.5-325 MG PO TABS
1.0000 | ORAL_TABLET | Freq: Four times a day (QID) | ORAL | Status: DC | PRN
  Administered 2023-07-08 – 2023-07-12 (×3): 1 via ORAL
  Filled 2023-07-08 (×3): qty 1

## 2023-07-08 MED ORDER — ACETAMINOPHEN 325 MG PO TABS
650.0000 mg | ORAL_TABLET | Freq: Four times a day (QID) | ORAL | Status: DC | PRN
  Administered 2023-07-09: 650 mg via ORAL
  Filled 2023-07-08: qty 2

## 2023-07-08 MED ORDER — CLONIDINE HCL 0.1 MG PO TABS
0.3000 mg | ORAL_TABLET | Freq: Once | ORAL | Status: AC
  Administered 2023-07-08: 0.3 mg via ORAL
  Filled 2023-07-08: qty 3

## 2023-07-08 NOTE — Assessment & Plan Note (Signed)
 Malignant HTN Continue home regimen of -norvasc  10mg  daily -metoprolol  25mg  daily -clonidine  .3mg  BID -losartan  100mg  daily -lasix  40mg  daily

## 2023-07-08 NOTE — ED Provider Notes (Signed)
.  Joint Aspiration/Arthrocentesis  Date/Time: 07/08/2023 2:30 PM  Performed by: Merdis Stalling, MD Authorized by: Merdis Stalling, MD   Consent:    Consent obtained:  Verbal   Consent given by:  Patient   Risks, benefits, and alternatives were discussed: yes     Risks discussed:  Bleeding, incomplete drainage, pain, nerve damage and infection   Alternatives discussed:  No treatment Universal protocol:    Immediately prior to procedure, a time out was called: yes     Patient identity confirmed:  Verbally with patient Location:    Location:  Knee   Knee:  L knee Anesthesia:    Anesthesia method:  Local infiltration   Local anesthetic:  Lidocaine  2% WITH epi Procedure details:    Preparation: Patient was prepped and draped in usual sterile fashion     Needle gauge:  18 G   Ultrasound guidance: no     Approach:  Lateral   Aspirate amount:  60cc   Aspirate characteristics:  Yellow   Steroid injected: no     Specimen collected: yes   Post-procedure details:    Dressing:  Adhesive bandage   Procedure completion:  Tolerated well, no immediate complications     Merdis Stalling, MD 07/08/23 1430

## 2023-07-08 NOTE — Plan of Care (Incomplete)

## 2023-07-08 NOTE — Assessment & Plan Note (Signed)
 Check  mangnesium Replete and trend Continue home potassium at 20meq daily

## 2023-07-08 NOTE — Assessment & Plan Note (Addendum)
 83 year old male presenting with 2-3 day history of worsening left knee pain and swelling and inability to walk fount to have large effusion likely from severe OA -obs to med surg -DVT study negative -xray with large effusion, mild diffuse [place] edema and severe 3 compartmental OA.  -aspiration done in ED and studies pending. WBC of 5000, no crystals seen -he has no fever/WBC or SIRS criteria to suggest septic joint  -will check inflammatory makers, RF, uric acid and follow culture/results of synovial studies  -ortho consulted: Dr. Cherl Corner -pain control  -ice pack if he will tolerate  -PT also ordered since he can not ambulate

## 2023-07-08 NOTE — ED Notes (Signed)
 Pt states he is due to take his BP med at 1800

## 2023-07-08 NOTE — ED Provider Notes (Signed)
 [place] [facility] AT [facility] Provider Note   CSN: [id number] Arrival date & time: 07/08/23  1131     Patient presents with: Leg Swelling   Jeffrey Carter is a 83 y.o. male with a history of prostate cancer, atrial fibrillation, and hypertension who presents the ED today.  Patient endorses pain and swelling to the left knee for the past 3 to 4 days. Patient reports that this morning he woke up and was unable to bear weight on the left leg. No known injury or trauma. No fevers at home.    Prior to Admission medications   Medication Sig Start Date End Date Taking? Authorizing Provider  allopurinol  (ZYLOPRIM ) 100 MG tablet Take 0.5 tablets (50 mg total) by mouth daily. Patient taking differently: Take 50 mg by mouth at bedtime. 08/31/22  Yes Plotnikov, Aleksei V, MD  amLODipine  (NORVASC ) 10 MG tablet Take 1 tablet (10 mg total) by mouth daily. 09/28/22  Yes Plotnikov, Oakley Bellman, MD  apixaban  (ELIQUIS ) 5 MG TABS tablet Take 1 tablet (5 mg total) by mouth 2 (two) times daily. Patient taking differently: Take 5 mg by mouth every evening. 04/24/23  Yes Fenton, Clint R, PA  cloNIDine  (CATAPRES ) 0.3 MG tablet Take 1 tablet by mouth twice daily 05/12/23  Yes Webb, Padonda B, FNP  diphenoxylate -atropine  (LOMOTIL ) 2.5-0.025 MG tablet Take 1-2 tablets by mouth 4 (four) times daily as needed for diarrhea or loose stools. 12/01/22  Yes Plotnikov, Aleksei V, MD  EQ ALLERGY RELIEF 10 MG tablet TAKE ONE TABLET BY MOUTH ONCE DAILY Patient taking differently: Take 10 mg by mouth every evening. 12/18/16  Yes Plotnikov, Oakley Bellman, MD  fluticasone  (FLONASE ) 50 MCG/ACT nasal spray Place 2 sprays into both nostrils daily. Patient taking differently: Place 2 sprays into both nostrils daily as needed for allergies or rhinitis. 03/11/15  Yes Sood, Vineet, MD  furosemide  (LASIX ) 40 MG tablet Take 1 tablet (40 mg total) by mouth daily. 03/31/23  Yes Plotnikov, Oakley Bellman, MD   HYDROcodone -acetaminophen  (NORCO) 7.5-325 MG tablet Take 1 tablet by mouth every 6 (six) hours as needed for moderate pain (pain score 4-6). 04/03/23  Yes Plotnikov, Oakley Bellman, MD  losartan  (COZAAR ) 100 MG tablet Take 1 tablet by mouth once daily 03/31/23  Yes Plotnikov, Aleksei V, MD  lovastatin  (MEVACOR ) 40 MG tablet TAKE 1 TABLET BY MOUTH ONCE DAILY IN THE EVENING AT  6PM Patient taking differently: Take 40 mg by mouth daily at 6 PM. 06/28/23  Yes Plotnikov, Aleksei V, MD  methocarbamol  (ROBAXIN ) 500 MG tablet Take 1 tablet (500 mg total) by mouth 2 (two) times daily. Patient taking differently: Take 500 mg by mouth 2 (two) times daily as needed for muscle spasms. 04/11/23  Yes Albertus Hughs, DO  metoprolol  succinate (TOPROL -XL) 25 MG 24 hr tablet Take 1 tablet (25 mg total) by mouth daily. Patient taking differently: Take 25 mg by mouth every evening. 05/31/23  Yes Fenton, Clint R, PA  ondansetron  (ZOFRAN -ODT) 4 MG disintegrating tablet Take 1 tablet (4 mg total) by mouth every 8 (eight) hours as needed for nausea or vomiting. Patient taking differently: Take 4 mg by mouth every 8 (eight) hours as needed for nausea or vomiting (DISSOLVE ORALLY). 12/01/22  Yes Plotnikov, Oakley Bellman, MD  Potassium Chloride  ER 20 MEQ TBCR Take 1 tablet by mouth once daily Patient taking differently: Take 20 mEq by mouth in the morning. 03/31/23  Yes Plotnikov, Oakley Bellman, MD  tamsulosin  (FLOMAX ) 0.4 MG CAPS  capsule Take 1 capsule (0.4 mg total) by mouth daily. 04/03/23  Yes Plotnikov, Oakley Bellman, MD  acetaminophen  (TYLENOL ) 325 MG tablet Take 2 tablets (650 mg total) by mouth every 8 (eight) hours. 08/31/16   Caccavale, Sophia, PA-C  diclofenac  Sodium (VOLTAREN ) 1 % GEL Apply 4 g topically 4 (four) times daily. 04/11/23   Albertus Hughs, DO  finasteride  (PROSCAR ) 5 MG tablet Take 5 mg by mouth daily. 07/29/21   [provider]  pantoprazole  (PROTONIX ) 40 MG tablet Take 1 tablet (40 mg total) by mouth daily. 12/01/22 12/01/23   Plotnikov, Oakley Bellman, MD  predniSONE  (DELTASONE ) 10 MG tablet TAKE 3 TABLETS BY MOUTH ON DAY 1, THEN 2 TABLETS ON DAY 2 THEN 1 TABLET ON DAY 3 AS NEEDED FOR GOUT ATTACK. TAKE AFTER MEALS Patient not taking: Reported on 07/08/2023 07/04/23   Plotnikov, Oakley Bellman, MD  sildenafil  (VIAGRA ) 100 MG tablet Take 1 tablet (100 mg total) by mouth as needed for erectile dysfunction. Patient not taking: Reported on 07/08/2023 01/11/16   Plotnikov, Oakley Bellman, MD    Allergies: Percocet [oxycodone -acetaminophen ]    Review of Systems  Musculoskeletal:  Positive for joint swelling.  All other systems reviewed and are negative.   Updated Vital Signs BP (!) 126/41   Pulse 95   Temp 98 F (36.7 C) (Oral)   Resp 17   Ht 5' 9 (1.753 m)   Wt 105.2 kg   SpO2 98%   BMI 34.26 kg/m   Physical Exam Vitals and nursing note reviewed.  Constitutional:      General: He is not in acute distress.    Appearance: Normal appearance.  HENT:     Head: Normocephalic and atraumatic.     Mouth/Throat:     Mouth: Mucous membranes are moist.   Eyes:     Conjunctiva/sclera: Conjunctivae normal.     Pupils: Pupils are equal, round, and reactive to light.    Cardiovascular:     Rate and Rhythm: Normal rate and regular rhythm.     Pulses: Normal pulses.  Pulmonary:     Effort: Pulmonary effort is normal.     Breath sounds: Normal breath sounds.  Abdominal:     Palpations: Abdomen is soft.     Tenderness: There is no abdominal tenderness.   Musculoskeletal:        General: Swelling and tenderness present.     Cervical back: Normal range of motion.     Right lower leg: No edema.     Left lower leg: Edema present.     Comments: Swelling to left lower extremity with TTP of the anterior knee. Impaired ROM on the knee second to pain. No swelling or tenderness of the right lower extremity.   Skin:    General: Skin is warm and dry.     Findings: No rash.   Neurological:     General: No focal deficit present.      Mental Status: He is alert.   Psychiatric:        Mood and Affect: Mood normal.        Behavior: Behavior normal.     (all labs ordered are listed, but only abnormal results are displayed) Labs Reviewed  BASIC METABOLIC PANEL WITH GFR - Abnormal; Notable for the following components:      Result Value   Potassium 3.2 (*)    Glucose, Bld 113 (*)    Calcium 8.8 (*)    All other components within normal limits  CBC WITH DIFFERENTIAL/PLATELET - Abnormal; Notable for the following components:   Hemoglobin 11.7 (*)    HCT 36.0 (*)    RDW 15.6 (*)    All other components within normal limits  SYNOVIAL CELL COUNT + DIFF, W/ CRYSTALS - Abnormal; Notable for the following components:   Appearance-Synovial CLOUDY (*)    WBC, Synovial 5,225 (*)    Neutrophil, Synovial 95 (*)    Monocyte-Macrophage-Synovial Fluid 5 (*)    All other components within normal limits  BODY FLUID CULTURE W GRAM STAIN  GLUCOSE, BODY FLUID OTHER            PROTEIN, BODY FLUID (OTHER)  URIC ACID, BODY FLUID  MAGNESIUM  SEDIMENTATION RATE  C-REACTIVE PROTEIN  URIC ACID  RHEUMATOID FACTOR    EKG: None  Radiology: VAS US  LOWER EXTREMITY VENOUS (DVT) (ONLY [facility] & [facility]) Result Date: 07/08/2023  Lower Venous DVT Study Patient Name:  Jeffrey Carter  Date of Exam:   07/08/2023 Medical Rec #: [id number]         Accession #:    [id number] Date of Birth: 25-Aug-1940         Patient Gender: M Patient Age:   25 years Exam Location:  [facility] Procedure:      VAS US  LOWER EXTREMITY VENOUS (DVT) Referring Phys: Sonnie Dusky --------------------------------------------------------------------------------  Indications: Swelling, and Pain of left knee. Unable to bear weight on leg. 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  Limitations: Patient unable to position knee secondary to pain. Comparison Study: No prior study Performing  Technologist: Carleene Chase RVS  Examination Guidelines: A complete evaluation includes B-mode imaging, spectral Doppler, color Doppler, and power Doppler as needed of all accessible portions of each vessel. Bilateral testing is considered an integral part of a complete examination. Limited examinations for reoccurring indications may be performed as noted. The reflux portion of the exam is performed with the patient in reverse Trendelenburg.  +-----+---------------+---------+-----------+----------+--------------+ RIGHTCompressibilityPhasicitySpontaneityPropertiesThrombus Aging +-----+---------------+---------+-----------+----------+--------------+ CFV  Full           Yes      No                                  +-----+---------------+---------+-----------+----------+--------------+ SFJ  Full                                                        +-----+---------------+---------+-----------+----------+--------------+   +---------+---------------+---------+-----------+----------+-------------------+ LEFT     CompressibilityPhasicitySpontaneityPropertiesThrombus Aging      +---------+---------------+---------+-----------+----------+-------------------+ CFV      Full           Yes      No                                       +---------+---------------+---------+-----------+----------+-------------------+ SFJ      Full                                                             +---------+---------------+---------+-----------+----------+-------------------+  FV Prox  Full           Yes      No                                       +---------+---------------+---------+-----------+----------+-------------------+ FV Mid   Full           Yes      No                                       +---------+---------------+---------+-----------+----------+-------------------+ FV DistalFull                                                              +---------+---------------+---------+-----------+----------+-------------------+ PFV      Full           Yes      No                                       +---------+---------------+---------+-----------+----------+-------------------+ POP                     Yes      No                   patent by color and                                                       Doppler             +---------+---------------+---------+-----------+----------+-------------------+ PTV      Full                                                             +---------+---------------+---------+-----------+----------+-------------------+ PERO     Full                                                             +---------+---------------+---------+-----------+----------+-------------------+ TP trunk Full                                                             +---------+---------------+---------+-----------+----------+-------------------+     Summary: RIGHT: - No evidence of common femoral vein obstruction.   LEFT: - There is no evidence of deep vein thrombosis in the lower extremity.  - Effusion noted medial  left knee  *See table(s) above for measurements and observations.    Preliminary    DG Knee Complete 4 Views Left Result Date: 07/08/2023 CLINICAL DATA:  Left knee pain and swelling, inability to bear weight, warm to touch EXAM: LEFT KNEE - COMPLETE 4+ VIEW COMPARISON:  None Available. FINDINGS: Frontal, bilateral oblique, and lateral views of the left knee are obtained. No acute fracture, subluxation, or dislocation. Severe 3 compartmental osteoarthritis greatest in the lateral compartment. Large suprapatellar joint effusion. Mild subcutaneous edema throughout the left knee. IMPRESSION: 1. Large left knee effusion. 2. Mild diffuse subcutaneous edema. 3. No acute or destructive bony abnormalities. 4. Severe 3 compartmental osteoarthritis, greatest laterally. Electronically Signed   By:  Bobbye Burrow M.D.   On: 07/08/2023 13:26     Procedures   Medications Ordered in the ED  morphine  (MSIR) tablet 15 mg (has no administration in time range)  morphine  (PF) 4 MG/ML injection 4 mg (4 mg Intravenous Given 07/08/23 1238)  potassium chloride  SA (KLOR-CON  M) CR tablet 40 mEq (40 mEq Oral Given 07/08/23 1343)  lidocaine -EPINEPHrine (XYLOCAINE  W/EPI) 2 %-1:100000 (with pres) injection 20 mL (20 mLs Intradermal Given by Other 07/08/23 1425)  cloNIDine  (CATAPRES ) tablet 0.3 mg (0.3 mg Oral Given 07/08/23 1720)                                    Medical Decision Making Amount and/or Complexity of Data Reviewed Labs: ordered. Radiology: ordered.  Risk Prescription drug management. Decision regarding hospitalization.   This patient presents to the ED for concern of knee pain and swelling, this involves an extensive number of treatment options, and is a complaint that carries with it a high risk of complications and morbidity.   Differential diagnosis includes: Cellulitis, joint effusion, septic arthritis, DVT, gout, etc.   Comorbidities  See HPI above   Additional History  Additional history obtained from prior records   Lab Tests  I ordered and personally interpreted labs.  The pertinent results include:   Potassium of 3.2 otherwise BMP is reassuring CBC is within normal limits Fluid cultures pending.   Imaging Studies  I ordered imaging studies including left knee x-ray as well as DVT study  I independently visualized and interpreted imaging which showed:  X-ray shows large knee effusion.  Mild diffuse subcutaneous edema.  No acute or obstructive bony abnormalities. Negative DVT study. I agree with the radiologist interpretation   Consultations  I requested consultation with Dr. Francesco Inks with [facility],  and discussed lab and imaging findings as well as pertinent plan - they recommend: admission for further work up and evaluation.   Problem List / ED Course /  Critical Interventions / Medication Management  Left knee pain and swelling for the past 3 days.  He went to start cardiac duration yesterday and woke up this morning was not able to bear weight on the leg secondary to knee pain.  Pain to touch at the knee without erythema. Arthrocentesis done with my attending, Dr. Drury Geralds.  Patient still has pain after fluid was removed from joint space. Unable to ambulate on left leg. I ordered medications including: Morphine  for pain  Reevaluation of the patient after these medicines showed that the patient improved some. Evening dose of clonidine  given for BP. I have reviewed the patients home medicines and have made adjustments as needed   Social Determinants of Health  Physical activity   Test /  Admission - Considered  Discussed findings with patient.  He is agreeable with plan for admission.    Final diagnoses:  Effusion of left knee    ED Discharge Orders     None          Sonnie Dusky, PA-C 07/08/23 1957    Merdis Stalling, MD 07/10/23 2231

## 2023-07-08 NOTE — H&P (Signed)
 History and Physical    Patient: Jeffrey Carter NWG:[id number] DOB: Jul 15, 1940 DOA: 07/08/2023 DOS: the patient was seen and examined on 07/08/2023 PCP: Plotnikov, Oakley Bellman, MD  Patient coming from: Home - lives with his wife.    Chief Complaint: left knee pain and swelling   HPI: Jeffrey Carter is a 83 y.o. male with medical history significant of BPH, HTN, OSA on cpap, hx of prostate cancer s/p radiation, hx of gout,  PAF who presented to ED for significant/severe swelling of left knee. He is unable to walk/bend knee. He states symptoms started a few days ago. He doesn't think it's gout. He's never had this in his knee, typically just his toe. He denies any injury to the knee. He denies any fever/chills. He came to ED due to pain and inability to walk.   Was seen on 6/10 with PCP and given prednisone  for gout flair. He did not start this.    Denies any fever/chills, vision changes/headaches, chest pain or palpitations, shortness of breath or cough, abdominal pain, N/V/D, dysuria or leg swelling except for knee.    He does not smoke or drink alcohol.   ER Course:  vitals: afebrile, bp: 171/86, HR: 87, RR: 17, oxygen: 100%RA Pertinent labs: hgb: 11.7, potassium: 3.2,  Left knee xray: large left knee effusion. Mild diffuse [place] edema. Severe 3 compartmental OA.  In ED: given potassium, clonidine  and knee drained with synovial studies. [facility] asked to admit.   Review of Systems: As mentioned in the history of present illness. All other systems reviewed and are negative. Past Medical History:  Diagnosis Date   Bladder outlet obstruction    BPH (benign prostatic hyperplasia)    Conjunctivitis, acute, bilateral    07-02-2015  per pcp note and started antibiotic drops   First degree heart block    History of acute conjunctivitis    07-02-2015  resolved after round of antibiotic eye drops   History of gout    BIG TOE   History of urinary retention 12/2014   Hx of adenomatous colonic  polyps 07/07/2016   Hyperlipidemia    Hypertension    Lower urinary tract symptoms (LUTS)    OSA on CPAP    MODERATE PER STUDY 03-08-2012   Osteoarthritis    PAC (premature atrial contraction)    Prostate cancer [facility]) urologist-  dr budzyn/  oncologist-  dr Drinda Gentry   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),  Gleason 3+4,  PSA 10.57,  vol //   External beam radiation therapy  08-27-2014 to 10-22-2014   S/P radiation therapy 08-27-2014  to  10-22-2014   prostate 7800Gy in 40 sessions and seminal vesicals 5000Gy in 40 sessions   Wears glasses    Past Surgical History:  Procedure Laterality Date   APPENDECTOMY  1966   COLONOSCOPY  07-06-2006   GOLD SEED IMPLANT N/A 08/12/2014   Procedure: GOLD SEED IMPLANT;  Surgeon: Jock Muller, MD;  Location: [facility];  Service: Urology;  Laterality: N/A;   GREEN LIGHT LASER TURP (TRANSURETHRAL RESECTION OF PROSTATE N/A 07/20/2015   Procedure: GREEN LIGHT LASER ABLATION OF PROSTATE ;  Surgeon: Bart Born, MD;  Location: [facility];  Service: Urology;  Laterality: N/A;   PROSTATE BIOPSY N/A 03/28/2014   Procedure: BIOPSY TRANSRECTAL ULTRASONIC PROSTATE (TUBP);  Surgeon: Arleen Bells, MD;  Location: [facility];  Service: Urology;  Laterality: N/A;   TONSILLECTOMY  as child   TOTAL KNEE ARTHROPLASTY Right  06-22-2007   Social History:  reports that he quit smoking about 61 years ago. His smoking use included cigarettes. He started smoking about 69 years ago. He has a 2 pack-year smoking history. He has been exposed to tobacco smoke. He has never used smokeless tobacco. He reports that he does not drink alcohol and does not use drugs.  Allergies  Allergen Reactions   Percocet [Oxycodone -Acetaminophen ] Nausea Only    Family History  Problem Relation Age of Onset   Diabetes Father    Cancer Brother        pancreatic, stomach   Colon cancer Neg Hx     Prior to Admission medications   Medication Sig Start  Date End Date Taking? Authorizing Provider  allopurinol  (ZYLOPRIM ) 100 MG tablet Take 0.5 tablets (50 mg total) by mouth daily. Patient taking differently: Take 50 mg by mouth at bedtime. 08/31/22  Yes Plotnikov, Aleksei V, MD  amLODipine  (NORVASC ) 10 MG tablet Take 1 tablet (10 mg total) by mouth daily. 09/28/22  Yes Plotnikov, Oakley Bellman, MD  apixaban  (ELIQUIS ) 5 MG TABS tablet Take 1 tablet (5 mg total) by mouth 2 (two) times daily. Patient taking differently: Take 5 mg by mouth every evening. 04/24/23  Yes Fenton, Clint R, PA  cloNIDine  (CATAPRES ) 0.3 MG tablet Take 1 tablet by mouth twice daily 05/12/23  Yes Webb, Padonda B, FNP  diphenoxylate -atropine  (LOMOTIL ) 2.5-0.025 MG tablet Take 1-2 tablets by mouth 4 (four) times daily as needed for diarrhea or loose stools. 12/01/22  Yes Plotnikov, Aleksei V, MD  EQ ALLERGY RELIEF 10 MG tablet TAKE ONE TABLET BY MOUTH ONCE DAILY Patient taking differently: Take 10 mg by mouth every evening. 12/18/16  Yes Plotnikov, Oakley Bellman, MD  fluticasone  (FLONASE ) 50 MCG/ACT nasal spray Place 2 sprays into both nostrils daily. Patient taking differently: Place 2 sprays into both nostrils daily as needed for allergies or rhinitis. 03/11/15  Yes Sood, Vineet, MD  furosemide  (LASIX ) 40 MG tablet Take 1 tablet (40 mg total) by mouth daily. 03/31/23  Yes Plotnikov, Aleksei V, MD  HYDROcodone -acetaminophen  (NORCO) 7.5-325 MG tablet Take 1 tablet by mouth every 6 (six) hours as needed for moderate pain (pain score 4-6). 04/03/23  Yes Plotnikov, Oakley Bellman, MD  losartan  (COZAAR ) 100 MG tablet Take 1 tablet by mouth once daily 03/31/23  Yes Plotnikov, Aleksei V, MD  lovastatin  (MEVACOR ) 40 MG tablet TAKE 1 TABLET BY MOUTH ONCE DAILY IN THE EVENING AT  6PM Patient taking differently: Take 40 mg by mouth daily at 6 PM. 06/28/23  Yes Plotnikov, Aleksei V, MD  methocarbamol  (ROBAXIN ) 500 MG tablet Take 1 tablet (500 mg total) by mouth 2 (two) times daily. Patient taking differently: Take 500  mg by mouth 2 (two) times daily as needed for muscle spasms. 04/11/23  Yes Albertus Hughs, DO  metoprolol  succinate (TOPROL -XL) 25 MG 24 hr tablet Take 1 tablet (25 mg total) by mouth daily. Patient taking differently: Take 25 mg by mouth every evening. 05/31/23  Yes Fenton, Clint R, PA  ondansetron  (ZOFRAN -ODT) 4 MG disintegrating tablet Take 1 tablet (4 mg total) by mouth every 8 (eight) hours as needed for nausea or vomiting. Patient taking differently: Take 4 mg by mouth every 8 (eight) hours as needed for nausea or vomiting (DISSOLVE ORALLY). 12/01/22  Yes Plotnikov, Oakley Bellman, MD  Potassium Chloride  ER 20 MEQ TBCR Take 1 tablet by mouth once daily Patient taking differently: Take 20 mEq by mouth in the morning. 03/31/23  Yes Plotnikov,  Oakley Bellman, MD  tamsulosin  (FLOMAX ) 0.4 MG CAPS capsule Take 1 capsule (0.4 mg total) by mouth daily. 04/03/23  Yes Plotnikov, Oakley Bellman, MD  acetaminophen  (TYLENOL ) 325 MG tablet Take 2 tablets (650 mg total) by mouth every 8 (eight) hours. 08/31/16   Caccavale, Sophia, PA-C  diclofenac  Sodium (VOLTAREN ) 1 % GEL Apply 4 g topically 4 (four) times daily. 04/11/23   Albertus Hughs, DO  finasteride  (PROSCAR ) 5 MG tablet Take 5 mg by mouth daily. 07/29/21   [provider]  pantoprazole  (PROTONIX ) 40 MG tablet Take 1 tablet (40 mg total) by mouth daily. 12/01/22 12/01/23  Plotnikov, Oakley Bellman, MD  predniSONE  (DELTASONE ) 10 MG tablet TAKE 3 TABLETS BY MOUTH ON DAY 1, THEN 2 TABLETS ON DAY 2 THEN 1 TABLET ON DAY 3 AS NEEDED FOR GOUT ATTACK. TAKE AFTER MEALS Patient not taking: Reported on 07/08/2023 07/04/23   Plotnikov, Oakley Bellman, MD  sildenafil  (VIAGRA ) 100 MG tablet Take 1 tablet (100 mg total) by mouth as needed for erectile dysfunction. Patient not taking: Reported on 07/08/2023 01/11/16   Genia Kettering, MD    Physical Exam: Vitals:   07/08/23 1800 07/08/23 1830 07/08/23 1900 07/08/23 1930  BP: (!) 188/162 (!) 169/94 (!) 171/88 (!) 126/41  Pulse: (!) 46 77 77 95   Resp: 16 17 18 17   Temp:    98 F (36.7 C)  TempSrc:    Oral  SpO2: 98% 94% 99% 98%  Weight:      Height:       General:  Appears calm and comfortable and is in NAD Eyes:  PERRL, EOMI, normal lids, iris ENT:  grossly normal hearing, lips & tongue, mmm; poor dentition Neck:  no LAD, masses or thyromegaly; no carotid bruits Cardiovascular:  RRR, no m/r/g. Edematous left knee, otherwise wnl  Respiratory:   CTA bilaterally with no wheezes/rales/rhonchi.  Normal respiratory effort. Abdomen:  soft, NT, ND, NABS Back:   normal alignment, no CVAT Skin:  no rash or induration seen on limited exam Musculoskeletal:  grossly normal tone BUE/RLE LLE: left knee with significant edema, warmth and TTP. Extremely limited ROM, can not bear weight  Lower extremity:  No LE edema.  Limited foot exam with no ulcerations.  2+ distal pulses. Psychiatric:  grossly normal mood and affect, speech fluent and appropriate, AOx3 Neurologic:  CN 2-12 grossly intact, moves all extremities in coordinated fashion, sensation intact   Radiological Exams on Admission: Independently reviewed - see discussion in A/P where applicable  VAS US  LOWER EXTREMITY VENOUS (DVT) (ONLY [facility] & [facility]) Result Date: 07/08/2023  Lower Venous DVT Study Patient Name:  Jeffrey Carter  Date of Exam:   07/08/2023 Medical Rec #: [id number]         Accession #:    [id number] Date of Birth: Sep 06, 1940         Patient Gender: M Patient Age:   74 years Exam Location:  [facility] Procedure:      VAS US  LOWER EXTREMITY VENOUS (DVT) Referring Phys: Sonnie Dusky --------------------------------------------------------------------------------  Indications: Swelling, and Pain of left knee. Unable to bear weight on leg. 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  Limitations: Patient unable to position knee secondary to pain. Comparison Study: No prior study Performing  Technologist: Carleene Chase RVS  Examination Guidelines: A complete evaluation  includes B-mode imaging, spectral Doppler, color Doppler, and power Doppler as needed of all accessible portions of each vessel. Bilateral testing is considered an integral part of a complete examination. Limited examinations for reoccurring indications may be performed as noted. The reflux portion of the exam is performed with the patient in reverse Trendelenburg.  +-----+---------------+---------+-----------+----------+--------------+ RIGHTCompressibilityPhasicitySpontaneityPropertiesThrombus Aging +-----+---------------+---------+-----------+----------+--------------+ CFV  Full           Yes      No                                  +-----+---------------+---------+-----------+----------+--------------+ SFJ  Full                                                        +-----+---------------+---------+-----------+----------+--------------+   +---------+---------------+---------+-----------+----------+-------------------+ LEFT     CompressibilityPhasicitySpontaneityPropertiesThrombus Aging      +---------+---------------+---------+-----------+----------+-------------------+ CFV      Full           Yes      No                                       +---------+---------------+---------+-----------+----------+-------------------+ SFJ      Full                                                             +---------+---------------+---------+-----------+----------+-------------------+ FV Prox  Full           Yes      No                                       +---------+---------------+---------+-----------+----------+-------------------+ FV Mid   Full           Yes      No                                       +---------+---------------+---------+-----------+----------+-------------------+ FV DistalFull                                                              +---------+---------------+---------+-----------+----------+-------------------+ PFV      Full           Yes      No                                       +---------+---------------+---------+-----------+----------+-------------------+ POP                     Yes      No  patent by color and                                                       Doppler             +---------+---------------+---------+-----------+----------+-------------------+ PTV      Full                                                             +---------+---------------+---------+-----------+----------+-------------------+ PERO     Full                                                             +---------+---------------+---------+-----------+----------+-------------------+ TP trunk Full                                                             +---------+---------------+---------+-----------+----------+-------------------+     Summary: RIGHT: - No evidence of common femoral vein obstruction.   LEFT: - There is no evidence of deep vein thrombosis in the lower extremity.  - Effusion noted medial left knee  *See table(s) above for measurements and observations.    Preliminary    DG Knee Complete 4 Views Left Result Date: 07/08/2023 CLINICAL DATA:  Left knee pain and swelling, inability to bear weight, warm to touch EXAM: LEFT KNEE - COMPLETE 4+ VIEW COMPARISON:  None Available. FINDINGS: Frontal, bilateral oblique, and lateral views of the left knee are obtained. No acute fracture, subluxation, or dislocation. Severe 3 compartmental osteoarthritis greatest in the lateral compartment. Large suprapatellar joint effusion. Mild subcutaneous edema throughout the left knee. IMPRESSION: 1. Large left knee effusion. 2. Mild diffuse subcutaneous edema. 3. No acute or destructive bony abnormalities. 4. Severe 3 compartmental osteoarthritis, greatest laterally. Electronically Signed   By:  Bobbye Burrow M.D.   On: 07/08/2023 13:26    EKG: pending    Labs on Admission: I have personally reviewed the available labs and imaging studies at the time of the admission.  Pertinent labs:   11.7 potassium: 3.2,   Assessment and Plan: Principal Problem:   Knee effusion, left Active Problems:   Hypokalemia   Normocytic anemia   Paroxysmal atrial fibrillation (HCC)   Essential hypertension   Gout   OSA (obstructive sleep apnea)    Assessment and Plan: * Knee effusion, left 83 year old male presenting with 2-3 day history of worsening left knee pain and swelling and inability to walk fount to have large effusion likely from severe OA -obs to med surg -DVT study negative -xray with large effusion, mild diffuse [place] edema and severe 3 compartmental OA.  -aspiration done in ED and studies pending. WBC of 5000, no crystals seen -he has no fever/WBC or SIRS criteria to suggest septic joint  -will check inflammatory  makers, RF, uric acid and follow culture/results of synovial studies  -ortho consulted: Dr. Cherl Corner -pain control  -ice pack if he will tolerate  -PT also ordered since he can not ambulate   Hypokalemia Check  mangnesium Replete and trend Continue home potassium at 20meq daily   Normocytic anemia Stable at baseline   Paroxysmal atrial fibrillation (HCC) CHA2DS2-VASc Score = 3  He wore 14 day cardiac monitor which showed 14% afib burden, avg HR 98 bpm.  Continue metoprolol  25mg  daily Continue eliquis  (has been taking only once/day, discussed he needs to be taking this twice a day)   Essential hypertension Malignant HTN Continue home regimen of -norvasc  10mg  daily -metoprolol  25mg  daily -clonidine  .3mg  BID -losartan  100mg  daily -lasix  40mg  daily   Gout Typically in toes Uric acid pending PCP has discussed allopurinol  with him   OSA (obstructive sleep apnea) Cpap nightly      Advance Care Planning:   Code Status: Full Code   Consults:  ortho: Dr. Cherl Corner   DVT Prophylaxis: eliquis    Family Communication: none   Severity of Illness: The appropriate patient status for this patient is OBSERVATION. Observation status is judged to be reasonable and necessary in order to provide the required intensity of service to ensure the patient's safety. The patient's presenting symptoms, physical exam findings, and initial radiographic and laboratory data in the context of their medical condition is felt to place them at decreased risk for further clinical deterioration. Furthermore, it is anticipated that the patient will be medically stable for discharge from the hospital within 2 midnights of admission.   Author: Raymona Caldwell, MD 07/08/2023 8:41 PM  For on call review [email or web address].

## 2023-07-08 NOTE — Assessment & Plan Note (Signed)
Cpap nightly.

## 2023-07-08 NOTE — Progress Notes (Addendum)
 VASCULAR LAB    Left lower extremity venous duplex has been performed.  See CV proc for preliminary results.  Relayed results to Dr. Drury Geralds, and Wendy Hamel, PA-C via secure chat  Carleene Chase, RVT 07/08/2023, 2:19 PM

## 2023-07-08 NOTE — Progress Notes (Signed)
   07/08/23 2129  BiPAP/CPAP/SIPAP  $ Non-Invasive Home Ventilator  Initial  $ Face Mask Large  Yes  BiPAP/CPAP/SIPAP Pt Type Adult (Prefers self placement)  BiPAP/CPAP/SIPAP Resmed  Mask Type Full face mask  Dentures removed? Not applicable  Mask Size Large  FiO2 (%) 21 %  Patient Home Machine No  Patient Home Mask No  Patient Home Tubing No  Auto Titrate Yes  Minimum cmH2O 5 cmH2O  Maximum cmH2O 20 cmH2O  Device Plugged into RED Power Outlet Yes  BiPAP/CPAP /SiPAP Vitals  Pulse Rate 94  Resp 18  SpO2 99 %  Bilateral Breath Sounds Clear  MEWS Score/Color  MEWS Score 0  MEWS Score Color Marrie Sizer

## 2023-07-08 NOTE — Assessment & Plan Note (Signed)
 Stable at baseline

## 2023-07-08 NOTE — Assessment & Plan Note (Signed)
 Typically in toes Uric acid pending PCP has discussed allopurinol  with him

## 2023-07-08 NOTE — ED Triage Notes (Signed)
 BIBA from home for left knee pain/swelling, unable to bear weight. Skin is tight and warm to touch. 174/100 BP 78 HR 96% room air 18 RR 136 cbg 99.3 T

## 2023-07-08 NOTE — Assessment & Plan Note (Addendum)
 CHA2DS2-VASc Score = 3  He wore 14 day cardiac monitor which showed 14% afib burden, avg HR 98 bpm.  Continue metoprolol  25mg  daily Continue eliquis  (has been taking only once/day, discussed he needs to be taking this twice a day)

## 2023-07-09 DIAGNOSIS — Z4789 Encounter for other orthopedic aftercare: Secondary | ICD-10-CM | POA: Diagnosis not present

## 2023-07-09 DIAGNOSIS — I340 Nonrheumatic mitral (valve) insufficiency: Secondary | ICD-10-CM | POA: Diagnosis not present

## 2023-07-09 DIAGNOSIS — Z860101 Personal history of adenomatous and serrated colon polyps: Secondary | ICD-10-CM | POA: Diagnosis not present

## 2023-07-09 DIAGNOSIS — Z87891 Personal history of nicotine dependence: Secondary | ICD-10-CM | POA: Diagnosis not present

## 2023-07-09 DIAGNOSIS — E66811 Obesity, class 1: Secondary | ICD-10-CM | POA: Diagnosis present

## 2023-07-09 DIAGNOSIS — Z743 Need for continuous supervision: Secondary | ICD-10-CM | POA: Diagnosis not present

## 2023-07-09 DIAGNOSIS — N179 Acute kidney failure, unspecified: Secondary | ICD-10-CM | POA: Diagnosis not present

## 2023-07-09 DIAGNOSIS — I719 Aortic aneurysm of unspecified site, without rupture: Secondary | ICD-10-CM | POA: Diagnosis not present

## 2023-07-09 DIAGNOSIS — Z79899 Other long term (current) drug therapy: Secondary | ICD-10-CM | POA: Diagnosis not present

## 2023-07-09 DIAGNOSIS — R339 Retention of urine, unspecified: Secondary | ICD-10-CM | POA: Diagnosis not present

## 2023-07-09 DIAGNOSIS — Z6834 Body mass index (BMI) 34.0-34.9, adult: Secondary | ICD-10-CM | POA: Diagnosis not present

## 2023-07-09 DIAGNOSIS — D6869 Other thrombophilia: Secondary | ICD-10-CM | POA: Diagnosis not present

## 2023-07-09 DIAGNOSIS — Z7901 Long term (current) use of anticoagulants: Secondary | ICD-10-CM | POA: Diagnosis not present

## 2023-07-09 DIAGNOSIS — R112 Nausea with vomiting, unspecified: Secondary | ICD-10-CM | POA: Diagnosis not present

## 2023-07-09 DIAGNOSIS — M109 Gout, unspecified: Secondary | ICD-10-CM | POA: Diagnosis not present

## 2023-07-09 DIAGNOSIS — R609 Edema, unspecified: Secondary | ICD-10-CM | POA: Diagnosis not present

## 2023-07-09 DIAGNOSIS — K219 Gastro-esophageal reflux disease without esophagitis: Secondary | ICD-10-CM | POA: Diagnosis not present

## 2023-07-09 DIAGNOSIS — E876 Hypokalemia: Secondary | ICD-10-CM | POA: Diagnosis not present

## 2023-07-09 DIAGNOSIS — Z8546 Personal history of malignant neoplasm of prostate: Secondary | ICD-10-CM | POA: Diagnosis not present

## 2023-07-09 DIAGNOSIS — Z885 Allergy status to narcotic agent status: Secondary | ICD-10-CM | POA: Diagnosis not present

## 2023-07-09 DIAGNOSIS — N320 Bladder-neck obstruction: Secondary | ICD-10-CM | POA: Diagnosis not present

## 2023-07-09 DIAGNOSIS — I10 Essential (primary) hypertension: Secondary | ICD-10-CM | POA: Diagnosis not present

## 2023-07-09 DIAGNOSIS — M6281 Muscle weakness (generalized): Secondary | ICD-10-CM | POA: Diagnosis not present

## 2023-07-09 DIAGNOSIS — M6250 Muscle wasting and atrophy, not elsewhere classified, unspecified site: Secondary | ICD-10-CM | POA: Diagnosis not present

## 2023-07-09 DIAGNOSIS — N400 Enlarged prostate without lower urinary tract symptoms: Secondary | ICD-10-CM | POA: Diagnosis present

## 2023-07-09 DIAGNOSIS — Z96651 Presence of right artificial knee joint: Secondary | ICD-10-CM | POA: Diagnosis present

## 2023-07-09 DIAGNOSIS — Z833 Family history of diabetes mellitus: Secondary | ICD-10-CM | POA: Diagnosis not present

## 2023-07-09 DIAGNOSIS — K5909 Other constipation: Secondary | ICD-10-CM | POA: Diagnosis not present

## 2023-07-09 DIAGNOSIS — I480 Paroxysmal atrial fibrillation: Secondary | ICD-10-CM | POA: Diagnosis not present

## 2023-07-09 DIAGNOSIS — R2689 Other abnormalities of gait and mobility: Secondary | ICD-10-CM | POA: Diagnosis not present

## 2023-07-09 DIAGNOSIS — G4733 Obstructive sleep apnea (adult) (pediatric): Secondary | ICD-10-CM | POA: Diagnosis not present

## 2023-07-09 DIAGNOSIS — I700 Atherosclerosis of aorta: Secondary | ICD-10-CM | POA: Diagnosis not present

## 2023-07-09 DIAGNOSIS — Z923 Personal history of irradiation: Secondary | ICD-10-CM | POA: Diagnosis not present

## 2023-07-09 DIAGNOSIS — D649 Anemia, unspecified: Secondary | ICD-10-CM | POA: Diagnosis not present

## 2023-07-09 DIAGNOSIS — R739 Hyperglycemia, unspecified: Secondary | ICD-10-CM | POA: Diagnosis not present

## 2023-07-09 DIAGNOSIS — M1712 Unilateral primary osteoarthritis, left knee: Secondary | ICD-10-CM | POA: Diagnosis not present

## 2023-07-09 DIAGNOSIS — M25462 Effusion, left knee: Secondary | ICD-10-CM | POA: Diagnosis not present

## 2023-07-09 DIAGNOSIS — E785 Hyperlipidemia, unspecified: Secondary | ICD-10-CM | POA: Diagnosis not present

## 2023-07-09 DIAGNOSIS — I4891 Unspecified atrial fibrillation: Secondary | ICD-10-CM | POA: Diagnosis not present

## 2023-07-09 DIAGNOSIS — J309 Allergic rhinitis, unspecified: Secondary | ICD-10-CM | POA: Diagnosis not present

## 2023-07-09 DIAGNOSIS — M25562 Pain in left knee: Secondary | ICD-10-CM | POA: Diagnosis not present

## 2023-07-09 LAB — BASIC METABOLIC PANEL WITH GFR
Anion gap: 10 (ref 5–15)
BUN: 13 mg/dL (ref 8–23)
CO2: 24 mmol/L (ref 22–32)
Calcium: 9 mg/dL (ref 8.9–10.3)
Chloride: 102 mmol/L (ref 98–111)
Creatinine, Ser: 0.88 mg/dL (ref 0.61–1.24)
GFR, Estimated: >60 mL/min (ref >60)
Glucose, Bld: 149 mg/dL — ABNORMAL HIGH (ref 70–99)
Potassium: 3 mmol/L — ABNORMAL LOW (ref 3.5–5.1)
Sodium: 136 mmol/L (ref 135–145)

## 2023-07-09 LAB — CBC
HCT: 37.4 % — ABNORMAL LOW (ref 39.0–52.0)
Hemoglobin: 12.4 g/dL — ABNORMAL LOW (ref 13.0–17.0)
MCH: 26.8 pg (ref 26.0–34.0)
MCHC: 33.2 g/dL (ref 30.0–36.0)
MCV: 81 fL (ref 80.0–100.0)
Platelets: 209 10*3/uL (ref 150–400)
RBC: 4.62 MIL/uL (ref 4.22–5.81)
RDW: 15.2 % (ref 11.5–15.5)
WBC: 8.9 10*3/uL (ref 4.0–10.5)
nRBC: 0 % (ref 0.0–0.2)

## 2023-07-09 LAB — MAGNESIUM: Magnesium: 2 mg/dL (ref 1.7–2.4)

## 2023-07-09 LAB — C-REACTIVE PROTEIN: CRP: 4.1 mg/dL — ABNORMAL HIGH (ref <1.0)

## 2023-07-09 LAB — SEDIMENTATION RATE: Sed Rate: 35 mm/h — ABNORMAL HIGH (ref 0–16)

## 2023-07-09 LAB — PHOSPHORUS: Phosphorus: 2.8 mg/dL (ref 2.5–4.6)

## 2023-07-09 LAB — MRSA NEXT GEN BY PCR, NASAL: MRSA by PCR Next Gen: NOT DETECTED

## 2023-07-09 LAB — TSH: TSH: 1.376 u[IU]/mL (ref 0.350–4.500)

## 2023-07-09 MED ORDER — DEXAMETHASONE 2 MG PO TABS
4.0000 mg | ORAL_TABLET | Freq: Three times a day (TID) | ORAL | Status: AC
  Administered 2023-07-09 – 2023-07-10 (×3): 4 mg via ORAL
  Filled 2023-07-09 (×3): qty 2

## 2023-07-09 MED ORDER — POTASSIUM CHLORIDE CRYS ER 20 MEQ PO TBCR
40.0000 meq | EXTENDED_RELEASE_TABLET | Freq: Four times a day (QID) | ORAL | Status: AC
  Administered 2023-07-09 (×2): 40 meq via ORAL
  Filled 2023-07-09 (×2): qty 2

## 2023-07-09 MED ORDER — DILTIAZEM HCL 25 MG/5ML IV SOLN
10.0000 mg | Freq: Once | INTRAVENOUS | Status: AC
  Administered 2023-07-09: 10 mg via INTRAVENOUS
  Filled 2023-07-09: qty 5

## 2023-07-09 MED ORDER — FINASTERIDE 5 MG PO TABS: 5.0000 mg | ORAL_TABLET | Freq: Every day | ORAL | Status: DC

## 2023-07-09 MED ORDER — METOPROLOL SUCCINATE ER 100 MG PO TB24
100.0000 mg | ORAL_TABLET | Freq: Every morning | ORAL | Status: DC
Start: 2023-07-10 — End: 2023-07-15
  Administered 2023-07-10 – 2023-07-14 (×5): 100 mg via ORAL
  Filled 2023-07-09 (×3): qty 4
  Filled 2023-07-09: qty 1
  Filled 2023-07-09: qty 4

## 2023-07-09 MED ORDER — CHLORHEXIDINE GLUCONATE CLOTH 2 % EX PADS
6.0000 | MEDICATED_PAD | Freq: Every day | CUTANEOUS | Status: DC
  Administered 2023-07-09 – 2023-07-11 (×2): 6 via TOPICAL

## 2023-07-09 MED ORDER — METOPROLOL SUCCINATE ER 25 MG PO TB24
100.0000 mg | ORAL_TABLET | Freq: Every day | ORAL | Status: DC
  Administered 2023-07-09: 100 mg via ORAL
  Filled 2023-07-09: qty 4

## 2023-07-09 MED ORDER — HYDRALAZINE HCL 20 MG/ML IJ SOLN
10.0000 mg | Freq: Three times a day (TID) | INTRAMUSCULAR | Status: DC | PRN
  Administered 2023-07-09 – 2023-07-14 (×9): 10 mg via INTRAVENOUS
  Filled 2023-07-09 (×10): qty 1

## 2023-07-09 MED ORDER — METOPROLOL TARTRATE 5 MG/5ML IV SOLN
5.0000 mg | Freq: Four times a day (QID) | INTRAVENOUS | Status: DC | PRN
  Administered 2023-07-09: 5 mg via INTRAVENOUS
  Filled 2023-07-09: qty 5

## 2023-07-09 MED ORDER — METOPROLOL SUCCINATE ER 25 MG PO TB24: 100.0000 mg | ORAL_TABLET | Freq: Every evening | ORAL | Status: DC

## 2023-07-09 MED ORDER — METOPROLOL SUCCINATE ER 50 MG PO TB24
50.0000 mg | ORAL_TABLET | Freq: Every evening | ORAL | Status: DC
  Administered 2023-07-09 – 2023-07-14 (×6): 50 mg via ORAL
  Filled 2023-07-09: qty 2
  Filled 2023-07-09: qty 1
  Filled 2023-07-09 (×4): qty 2

## 2023-07-09 MED ORDER — IBUPROFEN 800 MG PO TABS
800.0000 mg | ORAL_TABLET | Freq: Three times a day (TID) | ORAL | Status: AC
  Administered 2023-07-09 – 2023-07-10 (×5): 800 mg via ORAL
  Filled 2023-07-09 (×5): qty 1

## 2023-07-09 MED ORDER — ALLOPURINOL 100 MG PO TABS
50.0000 mg | ORAL_TABLET | Freq: Every day | ORAL | Status: DC
  Administered 2023-07-09 – 2023-07-14 (×6): 50 mg via ORAL
  Filled 2023-07-09 (×6): qty 1

## 2023-07-09 MED ORDER — METOPROLOL TARTRATE 5 MG/5ML IV SOLN
5.0000 mg | Freq: Once | INTRAVENOUS | Status: AC | PRN
  Administered 2023-07-09: 5 mg via INTRAVENOUS
  Filled 2023-07-09: qty 5

## 2023-07-09 MED ORDER — DILTIAZEM HCL 60 MG PO TABS
60.0000 mg | ORAL_TABLET | Freq: Four times a day (QID) | ORAL | Status: DC
  Administered 2023-07-09 – 2023-07-10 (×4): 60 mg via ORAL
  Filled 2023-07-09 (×4): qty 1

## 2023-07-09 NOTE — Progress Notes (Signed)
   07/09/23 2351  BiPAP/CPAP/SIPAP  BiPAP/CPAP/SIPAP Pt Type Adult  BiPAP/CPAP/SIPAP Resmed  Mask Type Full face mask  Dentures removed? Not applicable  Mask Size Large  Respiratory Rate 20 breaths/min  Flow Rate 2 lpm  Patient Home Machine No  Patient Home Mask No  Patient Home Tubing No  Auto Titrate Yes  Minimum cmH2O 5 cmH2O  Maximum cmH2O 20 cmH2O  CPAP/SIPAP surface wiped down Yes  Device Plugged into RED Power Outlet Yes  BiPAP/CPAP /SiPAP Vitals  Pulse Rate 96  Resp 18  SpO2 100 %  MEWS Score/Color  MEWS Score 0  MEWS Score Color Marrie Sizer

## 2023-07-09 NOTE — Consult Note (Signed)
 ORTHOPAEDIC CONSULTATION  REQUESTING PHYSICIAN: Barbee Lew, MD  PCP:  Genia Kettering, MD  Chief Complaint: Left knee pain  HPI: Jeffrey Carter is a 83 y.o. male who complains of acute on chronic left knee pain. He reports that he developed left knee pain last week. No known injury or trauma. The pain is so severe that he is unable to move the knee or weightbear. He presented to [facility] ED due to pain and inability to walk. He has a history of gout but reports this typically only affects the toe. He has never had any treatment to the knee in the past. No prior surgical history to the knee.  Past Medical History:  Diagnosis Date   Bladder outlet obstruction    BPH (benign prostatic hyperplasia)    Conjunctivitis, acute, bilateral    07-02-2015  per pcp note and started antibiotic drops   First degree heart block    History of acute conjunctivitis    07-02-2015  resolved after round of antibiotic eye drops   History of gout    BIG TOE   History of urinary retention 12/2014   Hx of adenomatous colonic polyps 07/07/2016   Hyperlipidemia    Hypertension    Lower urinary tract symptoms (LUTS)    OSA on CPAP    MODERATE PER STUDY 03-08-2012   Osteoarthritis    PAC (premature atrial contraction)    Prostate cancer [facility]) urologist-  dr budzyn/  oncologist-  dr Drinda Gentry   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),  Gleason 3+4,  PSA 10.57,  vol //   External beam radiation therapy  08-27-2014 to 10-22-2014   S/P radiation therapy 08-27-2014  to  10-22-2014   prostate 7800Gy in 40 sessions and seminal vesicals 5000Gy in 40 sessions   Wears glasses    Past Surgical History:  Procedure Laterality Date   APPENDECTOMY  1966   COLONOSCOPY  07-06-2006   GOLD SEED IMPLANT N/A 08/12/2014   Procedure: GOLD SEED IMPLANT;  Surgeon: Jock Muller, MD;  Location: [facility];  Service: Urology;  Laterality: N/A;   GREEN LIGHT LASER TURP (TRANSURETHRAL RESECTION OF  PROSTATE N/A 07/20/2015   Procedure: GREEN LIGHT LASER ABLATION OF PROSTATE ;  Surgeon: Bart Born, MD;  Location: [facility];  Service: Urology;  Laterality: N/A;   PROSTATE BIOPSY N/A 03/28/2014   Procedure: BIOPSY TRANSRECTAL ULTRASONIC PROSTATE (TUBP);  Surgeon: Arleen Bells, MD;  Location: [facility];  Service: Urology;  Laterality: N/A;   TONSILLECTOMY  as child   TOTAL KNEE ARTHROPLASTY Right 06-22-2007   Social History   Socioeconomic History   Marital status: Married    Spouse name: Not on file   Number of children: 4   Years of education: Not on file   Highest education level: Not on file  Occupational History   Occupation: retired  Tobacco Use   Smoking status: Former    Current packs/day: 0.00    Average packs/day: 0.3 packs/day for 8.0 years (2.0 ttl pk-yrs)    Types: Cigarettes    Start date: 01/24/1954    Quit date: 01/24/1962    Years since quitting: 61.4    Passive exposure: Past   Smokeless tobacco: Never   Tobacco comments:    Former smoker 04/24/23  Vaping Use   Vaping status: Never Used  Substance and Sexual Activity   Alcohol use: No   Drug use: No   Sexual activity: Yes  Partners: Female  Other Topics Concern   Not on file  Social History Narrative   Married 8 years- divorced- married '85   2 sons- '72, '74; 1 daughter '25   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, English as a foreign language   Social Drivers of Corporate investment banker Strain: Low Risk  (06/01/2023)   Overall Financial Resource Strain (CARDIA)    Difficulty of Paying Living Expenses: Not hard at all  Food Insecurity: No Food Insecurity (07/08/2023)   Hunger Vital Sign    Worried About Running Out of Food in the Last Year: Never true    Ran Out of Food in the Last Year: Never true  Transportation Needs: No Transportation Needs (07/08/2023)   PRAPARE - Administrator, Civil Service (Medical): No    Lack of Transportation (Non-Medical): No   Physical Activity: Sufficiently Active (06/01/2023)   Exercise Vital Sign    Days of Exercise per Week: 5 days    Minutes of Exercise per Session: 40 min  Stress: No Stress Concern Present (06/01/2023)   [employer] of Occupational Health - Occupational Stress Questionnaire    Feeling of Stress : Not at all  Social Connections: Socially Integrated (07/08/2023)   Social Connection and Isolation Panel    Frequency of Communication with Friends and Family: More than three times a week    Frequency of Social Gatherings with Friends and Family: More than three times a week    Attends Religious Services: 1 to 4 times per year    Active Member of [employer] or Organizations: Yes    Attends Banker Meetings: 1 to 4 times per year    Marital Status: Married   Family History  Problem Relation Age of Onset   Diabetes Father    Cancer Brother        pancreatic, stomach   Colon cancer Neg Hx    Allergies  Allergen Reactions   Percocet [Oxycodone -Acetaminophen ] Nausea Only   Prior to Admission medications   Medication Sig Start Date End Date Taking? Authorizing Provider  allopurinol  (ZYLOPRIM ) 100 MG tablet Take 0.5 tablets (50 mg total) by mouth daily. Patient taking differently: Take 50 mg by mouth at bedtime. 08/31/22  Yes Plotnikov, Aleksei V, MD  amLODipine  (NORVASC ) 10 MG tablet Take 1 tablet (10 mg total) by mouth daily. 09/28/22  Yes Plotnikov, Oakley Bellman, MD  apixaban  (ELIQUIS ) 5 MG TABS tablet Take 1 tablet (5 mg total) by mouth 2 (two) times daily. Patient taking differently: Take 5 mg by mouth every evening. 04/24/23  Yes Fenton, Clint R, PA  cloNIDine  (CATAPRES ) 0.3 MG tablet Take 1 tablet by mouth twice daily 05/12/23  Yes Webb, Padonda B, FNP  diphenoxylate -atropine  (LOMOTIL ) 2.5-0.025 MG tablet Take 1-2 tablets by mouth 4 (four) times daily as needed for diarrhea or loose stools. 12/01/22  Yes Plotnikov, Aleksei V, MD  EQ ALLERGY RELIEF 10 MG tablet TAKE ONE TABLET BY MOUTH  ONCE DAILY Patient taking differently: Take 10 mg by mouth every evening. 12/18/16  Yes Plotnikov, Oakley Bellman, MD  fluticasone  (FLONASE ) 50 MCG/ACT nasal spray Place 2 sprays into both nostrils daily. Patient taking differently: Place 2 sprays into both nostrils daily as needed for allergies or rhinitis. 03/11/15  Yes Sood, Vineet, MD  furosemide  (LASIX ) 40 MG tablet Take 1 tablet (40 mg total) by mouth daily. 03/31/23  Yes Plotnikov, Oakley Bellman, MD  HYDROcodone -acetaminophen  (NORCO) 7.5-325 MG tablet Take 1 tablet by mouth every 6 (  six) hours as needed for moderate pain (pain score 4-6). 04/03/23  Yes Plotnikov, Oakley Bellman, MD  losartan  (COZAAR ) 100 MG tablet Take 1 tablet by mouth once daily 03/31/23  Yes Plotnikov, Aleksei V, MD  lovastatin  (MEVACOR ) 40 MG tablet TAKE 1 TABLET BY MOUTH ONCE DAILY IN THE EVENING AT  6PM Patient taking differently: Take 40 mg by mouth daily at 6 PM. 06/28/23  Yes Plotnikov, Aleksei V, MD  methocarbamol  (ROBAXIN ) 500 MG tablet Take 1 tablet (500 mg total) by mouth 2 (two) times daily. Patient taking differently: Take 500 mg by mouth 2 (two) times daily as needed for muscle spasms. 04/11/23  Yes Albertus Hughs, DO  metoprolol  succinate (TOPROL -XL) 25 MG 24 hr tablet Take 1 tablet (25 mg total) by mouth daily. Patient taking differently: Take 25 mg by mouth every evening. 05/31/23  Yes Fenton, Clint R, PA  ondansetron  (ZOFRAN -ODT) 4 MG disintegrating tablet Take 1 tablet (4 mg total) by mouth every 8 (eight) hours as needed for nausea or vomiting. Patient taking differently: Take 4 mg by mouth every 8 (eight) hours as needed for nausea or vomiting (DISSOLVE ORALLY). 12/01/22  Yes Plotnikov, Oakley Bellman, MD  Potassium Chloride  ER 20 MEQ TBCR Take 1 tablet by mouth once daily Patient taking differently: Take 20 mEq by mouth in the morning. 03/31/23  Yes Plotnikov, Oakley Bellman, MD  tamsulosin  (FLOMAX ) 0.4 MG CAPS capsule Take 1 capsule (0.4 mg total) by mouth daily. 04/03/23  Yes Plotnikov,  Oakley Bellman, MD  acetaminophen  (TYLENOL ) 325 MG tablet Take 2 tablets (650 mg total) by mouth every 8 (eight) hours. 08/31/16   Caccavale, Sophia, PA-C  diclofenac  Sodium (VOLTAREN ) 1 % GEL Apply 4 g topically 4 (four) times daily. 04/11/23   Albertus Hughs, DO  finasteride  (PROSCAR ) 5 MG tablet Take 5 mg by mouth daily. 07/29/21   [provider]  pantoprazole  (PROTONIX ) 40 MG tablet Take 1 tablet (40 mg total) by mouth daily. 12/01/22 12/01/23  Plotnikov, Oakley Bellman, MD  predniSONE  (DELTASONE ) 10 MG tablet TAKE 3 TABLETS BY MOUTH ON DAY 1, THEN 2 TABLETS ON DAY 2 THEN 1 TABLET ON DAY 3 AS NEEDED FOR GOUT ATTACK. TAKE AFTER MEALS Patient not taking: Reported on 07/08/2023 07/04/23   Plotnikov, Oakley Bellman, MD  sildenafil  (VIAGRA ) 100 MG tablet Take 1 tablet (100 mg total) by mouth as needed for erectile dysfunction. Patient not taking: Reported on 07/08/2023 01/11/16   Plotnikov, Oakley Bellman, MD   VAS US  LOWER EXTREMITY VENOUS (DVT) (ONLY [facility] & [facility]) Result Date: 07/09/2023  Lower Venous DVT Study Patient Name:  Jeffrey Carter  Date of Exam:   07/08/2023 Medical Rec #: [id number]         Accession #:    [id number] Date of Birth: 26-Mar-1940         Patient Gender: M Patient Age:   58 years Exam Location:  [facility] Procedure:      VAS US  LOWER EXTREMITY VENOUS (DVT) Referring Phys: Sonnie Dusky --------------------------------------------------------------------------------  Indications: Swelling, and Pain of left knee. Unable to bear weight on leg. 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  Limitations: Patient unable to position knee secondary to pain. Comparison Study: No prior study Performing Technologist: Carleene Chase RVS  Examination Guidelines: A complete evaluation includes B-mode imaging, spectral Doppler, color Doppler,  and power Doppler as needed of all accessible portions of each vessel. Bilateral testing is  considered an integral part of a complete examination. Limited examinations for reoccurring indications may be performed as noted. The reflux portion of the exam is performed with the patient in reverse Trendelenburg.  +-----+---------------+---------+-----------+----------+--------------+ RIGHTCompressibilityPhasicitySpontaneityPropertiesThrombus Aging +-----+---------------+---------+-----------+----------+--------------+ CFV  Full           Yes      No                                  +-----+---------------+---------+-----------+----------+--------------+ SFJ  Full                                                        +-----+---------------+---------+-----------+----------+--------------+   +---------+---------------+---------+-----------+----------+-------------------+ LEFT     CompressibilityPhasicitySpontaneityPropertiesThrombus Aging      +---------+---------------+---------+-----------+----------+-------------------+ CFV      Full           Yes      No                                       +---------+---------------+---------+-----------+----------+-------------------+ SFJ      Full                                                             +---------+---------------+---------+-----------+----------+-------------------+ FV Prox  Full           Yes      No                                       +---------+---------------+---------+-----------+----------+-------------------+ FV Mid   Full           Yes      No                                       +---------+---------------+---------+-----------+----------+-------------------+ FV DistalFull                                                             +---------+---------------+---------+-----------+----------+-------------------+ PFV      Full           Yes      No                                       +---------+---------------+---------+-----------+----------+-------------------+ POP                      Yes      No  patent by color and                                                       Doppler             +---------+---------------+---------+-----------+----------+-------------------+ PTV      Full                                                             +---------+---------------+---------+-----------+----------+-------------------+ PERO     Full                                                             +---------+---------------+---------+-----------+----------+-------------------+ TP trunk Full                                                             +---------+---------------+---------+-----------+----------+-------------------+     Summary: RIGHT: - No evidence of common femoral vein obstruction.   LEFT: - There is no evidence of deep vein thrombosis in the lower extremity.  - Effusion noted medial left knee  *See table(s) above for measurements and observations. Electronically signed by Genny Kid MD on 07/09/2023 at 9:08:05 AM.    Final    DG Knee Complete 4 Views Left Result Date: 07/08/2023 CLINICAL DATA:  Left knee pain and swelling, inability to bear weight, warm to touch EXAM: LEFT KNEE - COMPLETE 4+ VIEW COMPARISON:  None Available. FINDINGS: Frontal, bilateral oblique, and lateral views of the left knee are obtained. No acute fracture, subluxation, or dislocation. Severe 3 compartmental osteoarthritis greatest in the lateral compartment. Large suprapatellar joint effusion. Mild subcutaneous edema throughout the left knee. IMPRESSION: 1. Large left knee effusion. 2. Mild diffuse subcutaneous edema. 3. No acute or destructive bony abnormalities. 4. Severe 3 compartmental osteoarthritis, greatest laterally. Electronically Signed   By: Bobbye Burrow M.D.   On: 07/08/2023 13:26    Positive ROS: All other systems have been reviewed and were otherwise negative with the exception of those mentioned in the HPI and as  above.  Physical Exam: General: Alert, no acute distress Cardiovascular: No pedal edema Respiratory: No cyanosis, no use of accessory musculature GI: No organomegaly, abdomen is soft and non-tender Skin: No lesions in the area of chief complaint Neurologic: Sensation intact distally Psychiatric: Patient is competent for consent with normal mood and affect Lymphatic: No axillary or cervical lymphadenopathy  MUSCULOSKELETAL: Left knee shows mild to moderate effusion. No erythema. Significant tenderness medially. Limited ROM secondary to pain and effusion. Neurovascularly intact.  Assessment: Acute on chronic left knee pain secondary to severe osteoarthritis  Plan: Patient has already had the knee aspirated with little to no relief in symptoms. From orthopedic standpoint, this does not warrant emergent surgical intervention. Recommended supportive care while in hospital.  Follow-up outpatient with [facility].   Brinton Canavan, [facility] [phone]  07/09/2023 9:21 AM

## 2023-07-09 NOTE — Plan of Care (Signed)
  Problem: Education: Goal: Knowledge of General Education information will improve Description: Including pain rating scale, medication(s)/side effects and non-pharmacologic comfort measures Outcome: Progressing   Problem: Pain Managment: Goal: General experience of comfort will improve and/or be controlled Outcome: Progressing   Problem: Clinical Measurements: Goal: Cardiovascular complication will be avoided Outcome: Not Progressing   Problem: Activity: Goal: Risk for activity intolerance will decrease Outcome: Not Progressing

## 2023-07-09 NOTE — Progress Notes (Signed)
     Patient Name: Jeffrey Carter           DOB: [birth year]  MRN: [id number]      Admission Date: 07/08/2023  Attending Provider: Barbee Lew, MD  Primary Diagnosis: Knee effusion, left   Level of care: Progressive   OVERNIGHT PROGRESS REPORT   Afib with RVR, HR 150-160's.  Hx of PAF. Takes PO metoprolol  and Eliquis  at home.  Asymptomatic and hemodynamically stable.   Plan:  Administer 5 mg IV lopressor ---> after administration HR 90-120's.  Transfer to telemetry floor for cardiac monitoring    Bailey Lesser, DNP, ACNPC- AG [facility] [place]

## 2023-07-09 NOTE — Progress Notes (Signed)
   07/09/23 1122  TOC Brief Assessment  Insurance and Status Reviewed  Patient has primary care physician Yes (PLOTNIKOV, ALEKSEI V)  Home environment has been reviewed From home, spouse  Prior level of function: Independent  Prior/Current Home Services No current home services  Social Drivers of Health Review SDOH reviewed no interventions necessary  Readmission risk has been reviewed Yes  Transition of care needs no transition of care needs at this time   Moon delivered at bedside.     Transition of Care Department [facility]) has reviewed patient and no TOC needs have been identified at this time. We will continue to monitor patient advancement through interdisciplinary progression rounds. If new patient transition needs arise, please place a TOC consult.

## 2023-07-09 NOTE — Progress Notes (Signed)
 PT Cancellation Note  Patient Details Name: Jeffrey Carter MRN: [id number] DOB: 07-29-1940   Cancelled Treatment:     PT order received but eval deferred 2* elevated HR and BP.  Will follow.   Anjelo Pullman 07/09/2023, 12:05 PM

## 2023-07-09 NOTE — Care Management Obs Status (Signed)
 MEDICARE OBSERVATION STATUS NOTIFICATION   Patient Details  Name: Jeffrey Carter MRN: [id number] Date of Birth: 04/21/1940   Medicare Observation Status Notification Given:  Yes    Jackelyn Illingworth Liane Redman, LCSW 07/09/2023, 11:40 AM

## 2023-07-09 NOTE — Progress Notes (Signed)
 PROGRESS NOTE    Jeffrey Carter  ZOX:[id number] DOB: 1940/04/22 DOA: 07/08/2023 PCP: Genia Kettering, MD   Brief Narrative: 83 year old male with history of obstructive sleep apnea on CPAP BPH hypertension obesity, prostate cancer status post radiation history of gout and paroxysmal atrial fibrillation on metoprolol  and Eliquis  at home admitted with left knee swelling and pain.  He is unable to wear bear weight unable to walk unable to bend his knee.  He saw his PCP on the 10th of this month was given prednisone  thinking it could be gout flare.  He has not taken any prednisone  yet.  He reports his gout flare usually happen in his toe.  Since he was not able to ambulate he came into the ER.  No fever chills chest pain shortness of breath cough abdominal pain nausea vomiting diarrhea dysuria.  No history of smoking or alcohol use.  X-ray showed large left knee effusion mild diffuse subcutaneous edema severe tricompartmental osteoarthritis.  ED physician drained left knee and sent for studies.   Assessment & Plan:   Principal Problem:   Knee effusion, left Active Problems:   Hypokalemia   Normocytic anemia   Paroxysmal atrial fibrillation (HCC)   Essential hypertension   Gout   OSA (obstructive sleep apnea)    #1 left knee swelling and pain patient admitted with left knee swelling and pain for the past 5 to 6 days prior to admission.  Unable to bear weight or walk on his left leg so he decided to come to the ED.  This is probably related to osteoarthritis x-ray showing severe osteoarthritis of the left knee. ED physician aspirated left knee fluid appears cloudy 5000 WBCs 95 neutrophils.  Gram stain moderate WBC predominantly polymorphonuclear leukocytes no organisms and no growth.  Patient is afebrile left knee feels slightly warmer than the right no erythema noted. Uric acid 5.5.  On allopurinol  at home restart allopurinol . Since there is no evidence of septic arthritis we will try  steroids for anti-inflammatory effect. NSAID for 3 days. If no improvement will get CT or MRI of the left knee. Venous Doppler no DVT Ortho consulted by ED PT consult  #2 A-fib RVR patient with history of paroxysmal A-fib outpatient cardiology notes reviewed On Eliquis  and metoprolol  25 daily Received 1 dose of 5 mg Lopressor  and 10 mg of Cardizem  and increased metoprolol  to 100 mg daily.  Cardiology consulted. TSH 1.37 Echo April 2025 EF 55 to 60%  #3 hypokalemia replete potassium 3.0 mag normal recheck in a.m. on Lasix  40 mg daily will continue daily supplementation with potassium    #4 history of gout was on allopurinol  prior to admission restart.  #5 obstructive sleep apnea on CPAP at home  #6 hyperlipidemia on statin  #7 BPH on Flomax     Estimated body mass index is 34.26 kg/m as calculated from the following:   Height as of this encounter: 5' 9 (1.753 m).   Weight as of this encounter: 105.2 kg.  DVT prophylaxis: Eliquis  Code Status: Full code Family Communication: None  disposition Plan:  Status is: inpatient   Consultants:  Cards ortho  Procedures: Left knee tap 6/14 Antimicrobials: None   Subjective:  Patient was moved to stepdown unit due to A-fib RVR overnight he denies palpitations chest pain shortness of breath Objective: Vitals:   07/08/23 2244 07/09/23 0004 07/09/23 0543 07/09/23 0816  BP: (!) 185/111 (!) 198/101 (!) 171/106   Pulse: 92 95 (!) 151   Resp: 17  17   Temp:   99 F (37.2 C) 99.3 F (37.4 C)  TempSrc:   Oral Oral  SpO2: 100% 100% 100%   Weight:      Height:        Intake/Output Summary (Last 24 hours) at 07/09/2023 0854 Last data filed at 07/09/2023 0500 Gross per 24 hour  Intake --  Output 800 ml  Net -800 ml   Filed Weights   07/08/23 1151  Weight: 105.2 kg    Examination:  General exam: Appears calm and comfortable  Respiratory system: Clear to auscultation. Respiratory effort normal. Cardiovascular system:  Irregular irregular. Gastrointestinal system: Abdomen is nondistended, soft and nontender. No organomegaly or masses felt. Normal bowel sounds heard. Central nervous system: Alert and oriented. No focal neurological deficits. Extremities: Left knee swollen tender   Data Reviewed: I have personally reviewed following labs and imaging studies  CBC: Recent Labs  Lab 07/08/23 1229 07/09/23 0321  WBC 6.4 8.9  NEUTROABS 4.1  --   HGB 11.7* 12.4*  HCT 36.0* 37.4*  MCV 81.3 81.0  PLT 217 209   Basic Metabolic Panel: Recent Labs  Lab 07/08/23 1229 07/08/23 1953 07/09/23 0321 07/09/23 0735  NA 138  --  136  --   K 3.2*  --  3.0*  --   CL 103  --  102  --   CO2 25  --  24  --   GLUCOSE 113*  --  149*  --   BUN 12  --  13  --   CREATININE 0.76  --  0.88  --   CALCIUM 8.8*  --  9.0  --   MG  --  2.0  --  2.0  PHOS  --   --   --  2.8   GFR: Estimated Creatinine Clearance: 78.7 mL/min (by C-G formula based on SCr of 0.88 mg/dL). Liver Function Tests: No results for input(s): AST, ALT, ALKPHOS, BILITOT, PROT, ALBUMIN in the last 168 hours. No results for input(s): LIPASE, AMYLASE in the last 168 hours. No results for input(s): AMMONIA in the last 168 hours. Coagulation Profile: No results for input(s): INR, PROTIME in the last 168 hours. Cardiac Enzymes: No results for input(s): CKTOTAL, CKMB, CKMBINDEX, TROPONINI in the last 168 hours. BNP (last 3 results) No results for input(s): PROBNP in the last 8760 hours. HbA1C: No results for input(s): HGBA1C in the last 72 hours. CBG: No results for input(s): GLUCAP in the last 168 hours. Lipid Profile: No results for input(s): CHOL, HDL, LDLCALC, TRIG, CHOLHDL, LDLDIRECT in the last 72 hours. Thyroid  Function Tests: Recent Labs    07/08/23 2121  TSH 0.602   Anemia Panel: No results for input(s): VITAMINB12, FOLATE, FERRITIN, TIBC, IRON, RETICCTPCT in the last 72  hours. Sepsis Labs: No results for input(s): PROCALCITON, LATICACIDVEN in the last 168 hours.  Recent Results (from the past 240 hours)  Body fluid culture w Gram Stain     Status: None (Preliminary result)   Collection Time: 07/08/23  2:20 PM   Specimen: Synovium; Body Fluid  Result Value Ref Range Status   Specimen Description   Final    SYNOVIAL Performed at [facility], [address], [place], [place] [address]    Special Requests   Final    KNEE Performed at [facility], [address], [place], [place] [address]    Gram Stain   Final    MODERATE WBC PRESENT, PREDOMINANTLY PMN NO ORGANISMS  SEEN    Culture   Final    NO GROWTH < 24 HOURS Performed at [facility], [address] [place], [place] [address]    Report Status PENDING  Incomplete         Radiology Studies: VAS US  LOWER EXTREMITY VENOUS (DVT) (ONLY [facility] & [facility]) Result Date: 07/08/2023  Lower Venous DVT Study Patient Name:  ALAKAI MACBRIDE  Date of Exam:   07/08/2023 Medical Rec #: [id number]         Accession #:    [id number] Date of Birth: [birth year]         Patient Gender: M Patient Age:   33 years Exam Location:  [facility] Procedure:      VAS US  LOWER EXTREMITY VENOUS (DVT) Referring Phys: Sonnie Dusky --------------------------------------------------------------------------------  Indications: Swelling, and Pain of left knee. Unable to bear weight on leg. 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  Limitations: Patient unable to position knee secondary to pain. Comparison Study: No prior study Performing Technologist: Carleene Chase RVS  Examination Guidelines: A complete evaluation includes B-mode imaging, spectral Doppler, color Doppler, and power Doppler as needed of all accessible portions of each vessel. Bilateral testing is considered an integral part of a complete  examination. Limited examinations for reoccurring indications may be performed as noted. The reflux portion of the exam is performed with the patient in reverse Trendelenburg.  +-----+---------------+---------+-----------+----------+--------------+ RIGHTCompressibilityPhasicitySpontaneityPropertiesThrombus Aging +-----+---------------+---------+-----------+----------+--------------+ CFV  Full           Yes      No                                  +-----+---------------+---------+-----------+----------+--------------+ SFJ  Full                                                        +-----+---------------+---------+-----------+----------+--------------+   +---------+---------------+---------+-----------+----------+-------------------+ LEFT     CompressibilityPhasicitySpontaneityPropertiesThrombus Aging      +---------+---------------+---------+-----------+----------+-------------------+ CFV      Full           Yes      No                                       +---------+---------------+---------+-----------+----------+-------------------+ SFJ      Full                                                             +---------+---------------+---------+-----------+----------+-------------------+ FV Prox  Full           Yes      No                                       +---------+---------------+---------+-----------+----------+-------------------+ FV Mid   Full  Yes      No                                       +---------+---------------+---------+-----------+----------+-------------------+ FV DistalFull                                                             +---------+---------------+---------+-----------+----------+-------------------+ PFV      Full           Yes      No                                       +---------+---------------+---------+-----------+----------+-------------------+ POP                     Yes      No                    patent by color and                                                       Doppler             +---------+---------------+---------+-----------+----------+-------------------+ PTV      Full                                                             +---------+---------------+---------+-----------+----------+-------------------+ PERO     Full                                                             +---------+---------------+---------+-----------+----------+-------------------+ TP trunk Full                                                             +---------+---------------+---------+-----------+----------+-------------------+     Summary: RIGHT: - No evidence of common femoral vein obstruction.   LEFT: - There is no evidence of deep vein thrombosis in the lower extremity.  - Effusion noted medial left knee  *See table(s) above for measurements and observations.    Preliminary    DG Knee Complete 4 Views Left Result Date: 07/08/2023 CLINICAL DATA:  Left knee pain and swelling, inability to bear weight, warm to touch EXAM: LEFT KNEE - COMPLETE 4+ VIEW COMPARISON:  None Available. FINDINGS: Frontal, bilateral oblique, and lateral views of the left knee are obtained. No acute fracture, subluxation, or dislocation. Severe 3 compartmental osteoarthritis  greatest in the lateral compartment. Large suprapatellar joint effusion. Mild subcutaneous edema throughout the left knee. IMPRESSION: 1. Large left knee effusion. 2. Mild diffuse subcutaneous edema. 3. No acute or destructive bony abnormalities. 4. Severe 3 compartmental osteoarthritis, greatest laterally. Electronically Signed   By: Bobbye Burrow M.D.   On: 07/08/2023 13:26     Scheduled Meds:  apixaban   5 mg Oral BID   furosemide   40 mg Oral Daily   loratadine   10 mg Oral QPM   metoprolol  succinate  100 mg Oral Daily   potassium chloride   40 mEq Oral Q6H   pravastatin  40 mg Oral q1800   tamsulosin   0.4 mg Oral  Daily   Continuous Infusions:   LOS: 0 days    Jeffrey Lew, MD 07/09/2023, 8:54 AM

## 2023-07-09 NOTE — Consult Note (Signed)
 Cardiology Consultation   Patient ID: Jeffrey Carter MRN: [id number]; DOB: 02/24/1940  Admit date: 07/08/2023 Date of Consult: 07/09/2023  PCP:  Genia Kettering, MD   [facility] Providers Cardiologist:  New to Dr. Loura Rower. [facility] 05/2023  Patient Profile: Jeffrey Carter is a 83 y.o. male with a hx of HTN, HLD, OSA on CPAP, gout, and atrial fibrillation dx 03/2023, who is being seen 07/09/2023 for the evaluation of rapid Afib at the request of Dr Augustus Ledger.  History of Present Illness: Jeffrey Carter  is an 83 year old with paroxysmal atrial fibrillation who presents with knee swelling and atrial fibrillation with rapid ventricular response. He was referred by his primary care doctor for management of atrial fibrillation.  He has a history of paroxysmal atrial fibrillation, first diagnosed on April 11, 2023, with a 14% atrial fibrillation burden. He was last in sinus rhythm on May 31, 2023. No chest pain, breathing issues, or palpitations are present. He is currently on Eliquis  5 mg twice daily and metoprolol  succinate.  He was admitted on July 08, 2023, with swelling in his knee, which has rendered him unable to walk or bend the knee. An effusion was found, and he is being evaluated by orthopedic surgery.  He is on Lasix  40 mg daily and has been receiving high-dose metoprolol  succinate. His potassium level was found to be 3.0, and he is receiving repletion. He has a history of mild anemia with stable hemoglobin levels between 11 and 12 since February 2025.  He has a history of hypertension, hyperlipidemia, obstructive sleep apnea, and a moderate aortic aneurysm found incidentally on a recent echocardiogram, along with mild mitral regurgitation. Aortic atherosclerosis was incidentally found on a renal study in August 2018. His ejection fraction is preserved.  Past Medical History:  Diagnosis Date   Bladder outlet obstruction    BPH (benign prostatic  hyperplasia)    Conjunctivitis, acute, bilateral    07-02-2015  per pcp note and started antibiotic drops   First degree heart block    History of acute conjunctivitis    07-02-2015  resolved after round of antibiotic eye drops   History of gout    BIG TOE   History of urinary retention 12/2014   Hx of adenomatous colonic polyps 07/07/2016   Hyperlipidemia    Hypertension    Lower urinary tract symptoms (LUTS)    OSA on CPAP    MODERATE PER STUDY 03-08-2012   Osteoarthritis    PAC (premature atrial contraction)    Prostate [facility] urologist-  dr budzyn/  oncologist-  dr Drinda Gentry   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),  Gleason 3+4,  PSA 10.57,  vol //   External beam radiation therapy  08-27-2014 to 10-22-2014   S/P radiation therapy 08-27-2014  to  10-22-2014   prostate 7800Gy in 40 sessions and seminal vesicals 5000Gy in 40 sessions   Wears glasses     Past Surgical History:  Procedure Laterality Date   APPENDECTOMY  1966   COLONOSCOPY  07-06-2006   GOLD SEED IMPLANT N/A 08/12/2014   Procedure: GOLD SEED IMPLANT;  Surgeon: Jock Muller, MD;  Location: [facility];  Service: Urology;  Laterality: N/A;   GREEN LIGHT LASER TURP (TRANSURETHRAL RESECTION OF PROSTATE N/A 07/20/2015   Procedure: GREEN LIGHT LASER ABLATION OF PROSTATE ;  Surgeon: Bart Born, MD;  Location: [facility];  Service: Urology;  Laterality: N/A;   PROSTATE BIOPSY N/A 03/28/2014  Procedure: BIOPSY TRANSRECTAL ULTRASONIC PROSTATE (TUBP);  Surgeon: Arleen Bells, MD;  Location[facility];  Service: Urology;  Laterality: N/A;   TONSILLECTOMY  as child   TOTAL KNEE ARTHROPLASTY Right 06-22-2007     Home Medications:  Prior to Admission medications   Medication Sig Start Date End Date Taking? Authorizing Provider  allopurinol  (ZYLOPRIM ) 100 MG tablet Take 0.5 tablets (50 mg total) by mouth daily. Patient taking differently: Take 50 mg by mouth at bedtime. 08/31/22   Yes Plotnikov, Aleksei V, MD  amLODipine  (NORVASC ) 10 MG tablet Take 1 tablet (10 mg total) by mouth daily. 09/28/22  Yes Plotnikov, Oakley Bellman, MD  apixaban  (ELIQUIS ) 5 MG TABS tablet Take 1 tablet (5 mg total) by mouth 2 (two) times daily. Patient taking differently: Take 5 mg by mouth every evening. 04/24/23  Yes Fenton, Clint R, PA  cloNIDine  (CATAPRES ) 0.3 MG tablet Take 1 tablet by mouth twice daily 05/12/23  Yes Webb, Padonda B, FNP  diphenoxylate -atropine  (LOMOTIL ) 2.5-0.025 MG tablet Take 1-2 tablets by mouth 4 (four) times daily as needed for diarrhea or loose stools. 12/01/22  Yes Plotnikov, Aleksei V, MD  EQ ALLERGY RELIEF 10 MG tablet TAKE ONE TABLET BY MOUTH ONCE DAILY Patient taking differently: Take 10 mg by mouth every evening. 12/18/16  Yes Plotnikov, Oakley Bellman, MD  fluticasone  (FLONASE ) 50 MCG/ACT nasal spray Place 2 sprays into both nostrils daily. Patient taking differently: Place 2 sprays into both nostrils daily as needed for allergies or rhinitis. 03/11/15  Yes Sood, Vineet, MD  furosemide  (LASIX ) 40 MG tablet Take 1 tablet (40 mg total) by mouth daily. 03/31/23  Yes Plotnikov, Aleksei V, MD  HYDROcodone -acetaminophen  (NORCO) 7.5-325 MG tablet Take 1 tablet by mouth every 6 (six) hours as needed for moderate pain (pain score 4-6). 04/03/23  Yes Plotnikov, Oakley Bellman, MD  losartan  (COZAAR ) 100 MG tablet Take 1 tablet by mouth once daily 03/31/23  Yes Plotnikov, Aleksei V, MD  lovastatin  (MEVACOR ) 40 MG tablet TAKE 1 TABLET BY MOUTH ONCE DAILY IN THE EVENING AT  6PM Patient taking differently: Take 40 mg by mouth daily at 6 PM. 06/28/23  Yes Plotnikov, Aleksei V, MD  methocarbamol  (ROBAXIN ) 500 MG tablet Take 1 tablet (500 mg total) by mouth 2 (two) times daily. Patient taking differently: Take 500 mg by mouth 2 (two) times daily as needed for muscle spasms. 04/11/23  Yes Albertus Hughs, DO  metoprolol  succinate (TOPROL -XL) 25 MG 24 hr tablet Take 1 tablet (25 mg total) by mouth daily. Patient  taking differently: Take 25 mg by mouth every evening. 05/31/23  Yes Fenton, Clint R, PA  ondansetron  (ZOFRAN -ODT) 4 MG disintegrating tablet Take 1 tablet (4 mg total) by mouth every 8 (eight) hours as needed for nausea or vomiting. Patient taking differently: Take 4 mg by mouth every 8 (eight) hours as needed for nausea or vomiting (DISSOLVE ORALLY). 12/01/22  Yes Plotnikov, Oakley Bellman, MD  Potassium Chloride  ER 20 MEQ TBCR Take 1 tablet by mouth once daily Patient taking differently: Take 20 mEq by mouth in the morning. 03/31/23  Yes Plotnikov, Oakley Bellman, MD  tamsulosin  (FLOMAX ) 0.4 MG CAPS capsule Take 1 capsule (0.4 mg total) by mouth daily. 04/03/23  Yes Plotnikov, Oakley Bellman, MD  acetaminophen  (TYLENOL ) 325 MG tablet Take 2 tablets (650 mg total) by mouth every 8 (eight) hours. 08/31/16   Caccavale, Sophia, PA-C  diclofenac  Sodium (VOLTAREN ) 1 % GEL Apply 4 g topically 4 (four) times daily. 04/11/23  Albertus Hughs, DO  finasteride  (PROSCAR ) 5 MG tablet Take 5 mg by mouth daily. 07/29/21   [provider]  pantoprazole  (PROTONIX ) 40 MG tablet Take 1 tablet (40 mg total) by mouth daily. 12/01/22 12/01/23  Plotnikov, Oakley Bellman, MD  predniSONE  (DELTASONE ) 10 MG tablet TAKE 3 TABLETS BY MOUTH ON DAY 1, THEN 2 TABLETS ON DAY 2 THEN 1 TABLET ON DAY 3 AS NEEDED FOR GOUT ATTACK. TAKE AFTER MEALS Patient not taking: Reported on 07/08/2023 07/04/23   Plotnikov, Oakley Bellman, MD  sildenafil  (VIAGRA ) 100 MG tablet Take 1 tablet (100 mg total) by mouth as needed for erectile dysfunction. Patient not taking: Reported on 07/08/2023 01/11/16   Plotnikov, Aleksei V, MD    Scheduled Meds:  allopurinol   50 mg Oral QHS   apixaban   5 mg Oral BID   dexamethasone   4 mg Oral Q8H   furosemide   40 mg Oral Daily   ibuprofen   800 mg Oral TID   loratadine   10 mg Oral QPM   metoprolol  succinate  100 mg Oral Daily   potassium chloride   40 mEq Oral Q6H   pravastatin  40 mg Oral q1800   tamsulosin   0.4 mg Oral Daily   Continuous  Infusions:  PRN Meds: acetaminophen  **OR** acetaminophen , hydrALAZINE , HYDROcodone -acetaminophen , methocarbamol , morphine  injection, ondansetron  **OR** ondansetron  (ZOFRAN ) IV, senna-docusate  Allergies:    Allergies  Allergen Reactions   Percocet [Oxycodone -Acetaminophen ] Nausea Only    Social History:   Social History   Socioeconomic History   Marital status: Married    Spouse name: Not on file   Number of children: 4   Years of education: Not on file   Highest education level: Not on file  Occupational History   Occupation: retired  Tobacco Use   Smoking status: Former    Current packs/day: 0.00    Average packs/day: 0.3 packs/day for 8.0 years (2.0 ttl pk-yrs)    Types: Cigarettes    Start date: 01/24/1954    Quit date: 01/24/1962    Years since quitting: 61.4    Passive exposure: Past   Smokeless tobacco: Never   Tobacco comments:    Former smoker 04/24/23  Vaping Use   Vaping status: Never Used  Substance and Sexual Activity   Alcohol use: No   Drug use: No   Sexual activity: Yes    Partners: Female  Other Topics Concern   Not on file  Social History Narrative   Married 8 years- divorced- married '85   2 sons- '72, '74; 1 daughter '59   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, English as a foreign language   Social Drivers of Corporate investment banker Strain: Low Risk  (06/01/2023)   Overall Financial Resource Strain (CARDIA)    Difficulty of Paying Living Expenses: Not hard at all  Food Insecurity: No Food Insecurity (07/08/2023)   Hunger Vital Sign    Worried About Running Out of Food in the Last Year: Never true    Ran Out of Food in the Last Year: Never true  Transportation Needs: No Transportation Needs (07/08/2023)   PRAPARE - Administrator, Civil Service (Medical): No    Lack of Transportation (Non-Medical): No  Physical Activity: Sufficiently Active (06/01/2023)   Exercise Vital Sign    Days of Exercise per Week: 5 days    Minutes of Exercise per  Session: 40 min  Stress: No Stress Concern Present (06/01/2023)   [employer] of Occupational Health - Occupational Stress Questionnaire  Feeling of Stress : Not at all  Social Connections: Socially Integrated (07/08/2023)   Social Connection and Isolation Panel    Frequency of Communication with Friends and Family: More than three times a week    Frequency of Social Gatherings with Friends and Family: More than three times a week    Attends Religious Services: 1 to 4 times per year    Active Member of [employer] or Organizations: Yes    Attends Banker Meetings: 1 to 4 times per year    Marital Status: Married  Catering manager Violence: Not At Risk (07/08/2023)   Humiliation, Afraid, Rape, and Kick questionnaire    Fear of Current or Ex-Partner: No    Emotionally Abused: No    Physically Abused: No    Sexually Abused: No    Family History:   Family History  Problem Relation Age of Onset   Diabetes Father    Cancer Brother        pancreatic, stomach   Colon cancer Neg Hx      ROS:  Please see the history of present illness.  All other ROS reviewed and negative.     Physical Exam/Data: Vitals:   07/08/23 2244 07/09/23 0004 07/09/23 0543 07/09/23 0816  BP: (!) 185/111 (!) 198/101 (!) 171/106   Pulse: 92 95 (!) 151   Resp: 17  17   Temp:   99 F (37.2 C) 99.3 F (37.4 C)  TempSrc:   Oral Oral  SpO2: 100% 100% 100%   Weight:      Height:        Intake/Output Summary (Last 24 hours) at 07/09/2023 0921 Last data filed at 07/09/2023 0500 Gross per 24 hour  Intake --  Output 800 ml  Net -800 ml      07/08/2023   11:51 AM 07/04/2023    2:39 PM 06/01/2023    2:41 PM  Last 3 Weights  Weight (lbs) 232 lb 232 lb 231 lb  Weight (kg) 105.235 kg 105.235 kg 104.781 kg     Body mass index is 34.26 kg/m.  General:  Well nourished, well developed, in no acute distress Neck: no JVD Cardiac:  normal S1, S2; iRRR tachycardia; no murmur  Lungs:  clear to  auscultation bilaterally, no wheezing, rhonchi or rales  Abd: soft, nontender, no hepatomegaly  Ext: Relative warmth in left knee Musculoskeletal:  No deformities, BUE and BLE strength normal and equal Skin: warm and dry  Neuro:  CNs 2-12 intact, no focal abnormalities noted Psych:  Normal affect   EKG:  The EKG was personally reviewed and demonstrates:   06/14 ECG is Afib, HR 79 06/15 ECG is Afib, HR 119  Telemetry:  Telemetry was personally reviewed and demonstrates:  Atrial fib, variable rate with likely aberrant conduction versus PVCs (07/08/2023)  Relevant CV Studies:  Renal study: Aortic atherosclerosis (08/31/2016)- Personally reviewed  Echo 04/26/23 Echocardiogram: Moderate aortic aneurysm, mild mitral regurgitation, preserved ejection fraction  1. Left ventricular ejection fraction, by estimation, is 55 to 60%. Left  ventricular ejection fraction by 3D volume is 56 %. The left ventricle has normal function. The left ventricle has no regional wall motion  abnormalities. Left ventricular diastolic function could not be evaluated.   2. Right ventricular systolic function is normal. The right ventricular  size is normal. There is mildly elevated pulmonary artery systolic  pressure. The estimated right ventricular systolic pressure is 42.3 mmHg.   3. The mitral valve is abnormal. Mild mitral valve  regurgitation.   4. The aortic valve is tricuspid. Aortic valve regurgitation is not  visualized. No aortic stenosis is present.   5. Aortic dilatation noted. There is moderate dilatation of the ascending aorta, measuring 45 mm.   6. The inferior vena cava is normal in size with <50% respiratory  variability, suggesting right atrial pressure of 8 mmHg.   Comparison(s): No prior Echocardiogram.   Laboratory Data: High Sensitivity Troponin:   Recent Labs  Lab 07/08/23 1229 07/08/23 1953 07/09/23 0321 07/09/23 0735  NA 138  --  136  --   K 3.2*  --  3.0*  --   CL 103  --  102  --    CO2 25  --  24  --   GLUCOSE 113*  --  149*  --   BUN 12  --  13  --   CREATININE 0.76  --  0.88  --   CALCIUM 8.8*  --  9.0  --   MG  --  2.0  --  2.0  GFRNONAA >60  --  >60  --   ANIONGAP 10  --  10  --       Hematology Recent Labs  Lab 07/08/23 1229 07/09/23 0321  WBC 6.4 8.9  RBC 4.43 4.62  HGB 11.7* 12.4*  HCT 36.0* 37.4*  MCV 81.3 81.0  MCH 26.4 26.8  MCHC 32.5 33.2  RDW 15.6* 15.2  PLT 217 209   Thyroid   Recent Labs  Lab 07/09/23 0735  TSH 1.376    BNPNo results for input(s): BNP, PROBNP in the last 168 hours.  DDimer No results for input(s): DDIMER in the last 168 hours.  Radiology/Studies:  VAS US  LOWER EXTREMITY VENOUS (DVT) (ONLY [facility] & [facility]) Result Date: 07/09/2023  Lower Venous DVT Study Patient Name:  JEANCARLOS MARCHENA  Date of Exam:   07/08/2023 Medical Rec #: [id number]         Accession #:    [id number] Date of Birth: [birth year]         Patient Gender: M Patient Age:   75 years Exam Location:  [facility] Procedure:      VAS US  LOWER EXTREMITY VENOUS (DVT) Referring Phys: Sonnie Dusky --------------------------------------------------------------------------------  Indications: Swelling, and Pain of left knee. Unable to bear weight on leg. 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  Limitations: Patient unable to position knee secondary to pain. Comparison Study: No prior study Performing Technologist: Carleene Chase RVS  Examination Guidelines: A complete evaluation includes B-mode imaging, spectral Doppler, color Doppler, and power Doppler as needed of all accessible portions of each vessel. Bilateral testing is considered an integral part of a complete examination. Limited examinations for reoccurring indications may be performed as noted. The reflux portion of the exam is performed with the patient in reverse Trendelenburg.   +-----+---------------+---------+-----------+----------+--------------+ RIGHTCompressibilityPhasicitySpontaneityPropertiesThrombus Aging +-----+---------------+---------+-----------+----------+--------------+ CFV  Full           Yes      No                                  +-----+---------------+---------+-----------+----------+--------------+ SFJ  Full                                                        +-----+---------------+---------+-----------+----------+--------------+   +---------+---------------+---------+-----------+----------+-------------------+  LEFT     CompressibilityPhasicitySpontaneityPropertiesThrombus Aging      +---------+---------------+---------+-----------+----------+-------------------+ CFV      Full           Yes      No                                       +---------+---------------+---------+-----------+----------+-------------------+ SFJ      Full                                                             +---------+---------------+---------+-----------+----------+-------------------+ FV Prox  Full           Yes      No                                       +---------+---------------+---------+-----------+----------+-------------------+ FV Mid   Full           Yes      No                                       +---------+---------------+---------+-----------+----------+-------------------+ FV DistalFull                                                             +---------+---------------+---------+-----------+----------+-------------------+ PFV      Full           Yes      No                                       +---------+---------------+---------+-----------+----------+-------------------+ POP                     Yes      No                   patent by color and                                                       Doppler              +---------+---------------+---------+-----------+----------+-------------------+ PTV      Full                                                             +---------+---------------+---------+-----------+----------+-------------------+ PERO     Full                                                             +---------+---------------+---------+-----------+----------+-------------------+  TP trunk Full                                                             +---------+---------------+---------+-----------+----------+-------------------+     Summary: RIGHT: - No evidence of common femoral vein obstruction.   LEFT: - There is no evidence of deep vein thrombosis in the lower extremity.  - Effusion noted medial left knee  *See table(s) above for measurements and observations. Electronically signed by Genny Kid MD on 07/09/2023 at 9:08:05 AM.    Final    DG Knee Complete 4 Views Left Result Date: 07/08/2023 CLINICAL DATA:  Left knee pain and swelling, inability to bear weight, warm to touch EXAM: LEFT KNEE - COMPLETE 4+ VIEW COMPARISON:  None Available. FINDINGS: Frontal, bilateral oblique, and lateral views of the left knee are obtained. No acute fracture, subluxation, or dislocation. Severe 3 compartmental osteoarthritis greatest in the lateral compartment. Large suprapatellar joint effusion. Mild subcutaneous edema throughout the left knee. IMPRESSION: 1. Large left knee effusion. 2. Mild diffuse subcutaneous edema. 3. No acute or destructive bony abnormalities. 4. Severe 3 compartmental osteoarthritis, greatest laterally. Electronically Signed   By: Bobbye Burrow M.D.   On: 07/08/2023 13:26     Assessment and Plan:  Paroxysmal atrial fibrillation with rapid ventricular response - Paroxysmal atrial fibrillation with rapid ventricular response, likely triggered by acute illness related to knee effusion. Asymptomatic with preserved ejection fraction. Elevated blood pressure. Risk of  bradycardia if AV nodal agents are increased during conversion to sinus rhythm. Compliance with Eliquis  noted, potential need for temporary discontinuation for knee intervention. Focus on stroke prevention through anticoagulation. - Initiate oral diltiazem  60 mg Q6 hours, titrate based on blood pressure tolerance. - Just given succinate 100 mg  - Continue Eliquis  5 mg BID, monitor for potential temporary discontinuation for knee intervention. - Monitor heart rate and rhythm on telemetry. - he is asymptomatic of AF  Hypertension Significantly elevated blood pressure, management complicated by atrial fibrillation with rapid ventricular response. - Monitor blood pressure closely. - Adjust antihypertensive therapy as needed in conjunction with atrial fibrillation management.  Moderate aortic aneurysm - need follow up imaging in 6 months (outpatient)  Mild mitral regurgitation - Mild mitral regurgitation noted on recent echocardiogram. No acute symptoms or intervention required.  Aortic atherosclerosis - Aortic atherosclerosis incidentally found on renal study in August 2018. No acute symptoms or intervention required. - as outpatient will set LDL goal < 70  Hypokalemia Potassium level at 3.0. Repletion initiated. - K goal of 4  As per primary/other consultants: Knee effusion Mild anemia   Risk Assessment/Risk Scores: CHA2DS2-VASc Score = 3  This indicates a 3.2% annual risk of stroke. The patient's score is based upon: CHF History: 0 HTN History: 1 Diabetes History: 0 Stroke History: 0 Vascular Disease History: 0 Age Score: 2 Gender Score: 0    For questions or updates, please contact [facility] Please consult [email or web address] for contact info under   Gloriann Larger, MD FASE [facility] Cardiologist [facility]  [address] [place], [place] [address] [phone]  10:03 AM

## 2023-07-10 DIAGNOSIS — M25462 Effusion, left knee: Secondary | ICD-10-CM | POA: Diagnosis not present

## 2023-07-10 LAB — BASIC METABOLIC PANEL WITH GFR
Anion gap: 10 (ref 5–15)
BUN: 14 mg/dL (ref 8–23)
CO2: 23 mmol/L (ref 22–32)
Calcium: 9.5 mg/dL (ref 8.9–10.3)
Chloride: 100 mmol/L (ref 98–111)
Creatinine, Ser: 0.91 mg/dL (ref 0.61–1.24)
GFR, Estimated: >60 mL/min (ref >60)
Glucose, Bld: 147 mg/dL — ABNORMAL HIGH (ref 70–99)
Potassium: 3.9 mmol/L (ref 3.5–5.1)
Sodium: 133 mmol/L — ABNORMAL LOW (ref 135–145)

## 2023-07-10 LAB — GLUCOSE, BODY FLUID OTHER: Glucose, Body Fluid Other: 99 mg/dL

## 2023-07-10 LAB — MAGNESIUM: Magnesium: 2.2 mg/dL (ref 1.7–2.4)

## 2023-07-10 LAB — URIC ACID, BODY FLUID: Uric Acid Body Fluid: 5.3 mg/dL

## 2023-07-10 LAB — PROTEIN, BODY FLUID (OTHER): Total Protein, Body Fluid Other: 3.5 g/dL

## 2023-07-10 MED ORDER — DEXAMETHASONE 2 MG PO TABS
4.0000 mg | ORAL_TABLET | Freq: Three times a day (TID) | ORAL | Status: AC
  Administered 2023-07-10 – 2023-07-11 (×3): 4 mg via ORAL
  Filled 2023-07-10 (×3): qty 2

## 2023-07-10 MED ORDER — LOSARTAN POTASSIUM 50 MG PO TABS
100.0000 mg | ORAL_TABLET | Freq: Every day | ORAL | Status: DC
  Administered 2023-07-10 – 2023-07-14 (×5): 100 mg via ORAL
  Filled 2023-07-10 (×5): qty 2

## 2023-07-10 MED ORDER — DILTIAZEM HCL ER COATED BEADS 180 MG PO CP24
300.0000 mg | ORAL_CAPSULE | Freq: Every day | ORAL | Status: DC
  Administered 2023-07-10 – 2023-07-14 (×5): 300 mg via ORAL
  Filled 2023-07-10 (×5): qty 1

## 2023-07-10 NOTE — Evaluation (Addendum)
 Physical Therapy Evaluation Patient Details Name: Jeffrey Carter MRN: [id number] DOB: 07/28/1940 Today's Date: 07/10/2023  History of Present Illness  83 year old male presents 07/08/23 with Left knee pain and inability to ambulate and  weight bear,  s/p aspiration.X-ray showed large left knee effusion mild diffuse subcutaneous edema severe tricompartmental osteoarthritis.  PMH: obstructive sleep apnea on CPA,P BPH, hypertension, obesity, prostate cancer, gout and paroxysmal atrial fibrillation.  Clinical Impression  Pt admitted with above diagnosis.  Pt currently with functional limitations due to the deficits listed below (see PT Problem List). Pt will benefit from acute skilled PT to increase their independence and safety with mobility to allow discharge.     The patient reports 8/10 left knee pain. Patient moves slowly , guarding the LLE. Patient able to mobilize with assistance, able to  allow L knee to flex to ~ 50* sitting on bed edge. Patient did tolerate step pivot with limited WB on the  LLE with +2 mod assist for safety. Patient  unable to ambulate at this time. Patient did  perform gentle passive ROM to the left knee using the  gait belt to assist.  Patient  currently  way bellow baseline and may benefit from post acute rehab unless Left knee symptoms resolve  to allow ambulation with at least a RW. Patient resides with wife for whom he does provide some care ( wife  with daughter currently. )  Patient will benefit from continued inpatient follow up therapy, <3 hours/day  HR noted  from 101 -138 during mobility, last BP 196/99, RN aware.      If plan is discharge home, recommend the following: Two people to help with walking and/or transfers;A little help with bathing/dressing/bathroom;Assistance with cooking/housework;Assist for transportation;Help with stairs or ramp for entrance   Can travel by private vehicle        Equipment Recommendations Rolling walker (2 wheels)   Recommendations for Other Services  OT consult    Functional Status Assessment Patient has had a recent decline in their functional status and demonstrates the ability to make significant improvements in function in a reasonable and predictable amount of time.     Precautions / Restrictions Precautions Precautions: Fall Precaution/Restrictions Comments: left knee pain is significant, monitor BP and HR, both high Restrictions Weight Bearing Restrictions Per Provider Order: No      Mobility  Bed Mobility Overal bed mobility: Needs Assistance Bed Mobility: Supine to Sit     Supine to sit: Mod assist, HOB elevated, Used rails     General bed mobility comments: gentle support of LLE to move  to bed edge and lower foot to floor    Transfers Overall transfer level: Needs assistance Equipment used: Rolling walker (2 wheels) Transfers: Sit to/from Stand, Bed to chair/wheelchair/BSC Sit to Stand: Mod assist, +2 safety/equipment, +2 physical assistance   Step pivot transfers: Mod assist, +2 physical assistance, +2 safety/equipment       General transfer comment: gradually able to bear slight weight on the left foot to step to recliner, ~ 15/% WB    Ambulation/Gait                  Stairs            Wheelchair Mobility     Tilt Bed    Modified Rankin (Stroke Patients Only)       Balance Overall balance assessment: Needs assistance Sitting-balance support: No upper extremity supported, Feet supported Sitting balance-Leahy Scale: Fair  Standing balance support: Bilateral upper extremity supported, During functional activity Standing balance-Leahy Scale: Poor                               Pertinent Vitals/Pain Pain Assessment Pain Assessment: 0-10 Pain Score: 8  Pain Location: left knee Pain Descriptors / Indicators: Constant, Discomfort, Grimacing, Guarding Pain Intervention(s): Monitored during session, Premedicated before  session, Repositioned, Limited activity within patient's tolerance    Home Living Family/patient expects to be discharged to:: Private residence Living Arrangements: Spouse/significant other Available Help at Discharge: Family;Available PRN/intermittently Type of Home: House Home Access: Stairs to enter Entrance Stairs-Rails: Doctor, general practice of Steps: 4   Home Layout: One level Home Equipment: Cane - single point Additional Comments: wife unable to assit,    Prior Function Prior Level of Function : Independent/Modified Independent;Driving                     Extremity/Trunk Assessment   Upper Extremity Assessment Upper Extremity Assessment: Overall WFL for tasks assessed    Lower Extremity Assessment Lower Extremity Assessment: LLE deficits/detail LLE Deficits / Details: tolerated  passive knee flex when sitting on bed edge/recliner to ~ 50*, decreased tolerance too active knee extension, tolerated using belt on foot.    Cervical / Trunk Assessment Cervical / Trunk Assessment: Normal  Communication   Communication Communication: No apparent difficulties    Cognition Arousal: Alert Behavior During Therapy: WFL for tasks assessed/performed   PT - Cognitive impairments: No apparent impairments                         Following commands: Intact       Cueing       General Comments      Exercises General Exercises - Lower Extremity Long Arc Quad: AAROM, Left, 10 reps (used belt)   Assessment/Plan    PT Assessment Patient needs continued PT services  PT Problem List Decreased strength;Decreased activity tolerance;Decreased mobility;Cardiopulmonary status limiting activity;Decreased range of motion;Decreased balance;Decreased knowledge of precautions;Pain       PT Treatment Interventions DME instruction;Stair training;Therapeutic activities;Gait training;Functional mobility training;Therapeutic exercise;Patient/family education     PT Goals (Current goals can be found in the Care Plan section)  Acute Rehab PT Goals Patient Stated Goal: walk, no pain, PT Goal Formulation: With patient Time For Goal Achievement: 07/24/23 Potential to Achieve Goals: Good    Frequency Min 3X/week     Co-evaluation               AM-PAC PT 6 Clicks Mobility  Outcome Measure Help needed turning from your back to your side while in a flat bed without using bedrails?: A Little Help needed moving from lying on your back to sitting on the side of a flat bed without using bedrails?: A Lot Help needed moving to and from a bed to a chair (including a wheelchair)?: A Lot Help needed standing up from a chair using your arms (e.g., wheelchair or bedside chair)?: A Lot Help needed to walk in hospital room?: Total Help needed climbing 3-5 steps with a railing? : Total 6 Click Score: 11    End of Session Equipment Utilized During Treatment: Gait belt Activity Tolerance: Patient limited by pain Patient left: in chair;with call bell/phone within reach Nurse Communication: Mobility status PT Visit Diagnosis: Unsteadiness on feet (R26.81);Other abnormalities of gait and mobility (R26.89);Muscle weakness (generalized) (M62.81);Difficulty in walking,  not elsewhere classified (R26.2);Pain Pain - Right/Left: Left Pain - part of body: Knee    Time: 4259-5638 PT Time Calculation (min) (ACUTE ONLY): 21 min   Charges:   PT Evaluation $PT Eval Low Complexity: 1 Low   PT General Charges $$ ACUTE PT VISIT: 1 Visit         Abelina Hoes PT Acute Rehabilitation Services Office [phone]   Dareen Ebbing 07/10/2023, 12:31 PM

## 2023-07-10 NOTE — Progress Notes (Signed)
 PROGRESS NOTE    Jeffrey Carter  ZOX:[id number] DOB: 04-20-1940 DOA: 07/08/2023 PCP: Genia Kettering, MD   Brief Narrative: 83 year old male with history of obstructive sleep apnea on CPAP BPH hypertension obesity, prostate cancer status post radiation history of gout and paroxysmal atrial fibrillation on metoprolol  and Eliquis  at home admitted with left knee swelling and pain.  He is unable to wear bear weight unable to walk unable to bend his knee.  He saw his PCP on the 10th of this month was given prednisone  thinking it could be gout flare.  He has not taken any prednisone  yet.  He reports his gout flare usually happen in his toe.  Since he was not able to ambulate he came into the ER.  No fever chills chest pain shortness of breath cough abdominal pain nausea vomiting diarrhea dysuria.  No history of smoking or alcohol use.  X-ray showed large left knee effusion mild diffuse subcutaneous edema severe tricompartmental osteoarthritis.  ED physician drained left knee and sent for studies.   Assessment & Plan:   Principal Problem:   Knee effusion, left Active Problems:   Hypokalemia   Normocytic anemia   Paroxysmal atrial fibrillation (HCC)   Essential hypertension   Gout   OSA (obstructive sleep apnea)    #1 left knee swelling and pain due to severe osteoarthritis. patient admitted with left knee swelling and pain for the past 5 to 6 days prior to admission.  Unable to bear weight or walk on his left leg so he decided to come to the ED.   x-ray showing severe osteoarthritis of the left knee. ED physician aspirated left knee fluid appears cloudy 5000 WBCs 95 neutrophils.  Gram stain moderate WBC predominantly polymorphonuclear leukocytes no organisms and no growth.  Patient remains afebrile with no leukocytosis.  Patient received Decadron  with improvement in pain though range of motion still impaired. NSAID for 3 days. If no improvement will get CT or MRI of the left knee. Venous  Doppler no DVT Ortho consulted by ED no new recs other than outpatient follow-up PT consult  #2 A-fib RVR patient with history of paroxysmal A-fib outpatient cardiology notes reviewed On Eliquis  and metoprolol  25 daily prior to admission Cardiology consulted signed off 07/10/2023 Increased metoprolol  200 in the morning and 50 at night, added Cardizem  300 mg daily. Normal TSH. TSH 1.37 Echo April 2025 EF 55 to 60%  #3 hypokalemia repleted    #4 history of gout continue  allopurinol   #5 obstructive sleep apnea on CPAP at home  #6 hyperlipidemia on statin  #7 BPH on Flomax     Estimated body mass index is 34.26 kg/m as calculated from the following:   Height as of this encounter: 5' 9 (1.753 m).   Weight as of this encounter: 105.2 kg.  DVT prophylaxis: Eliquis  Code Status: Full code Family Communication: None  disposition Plan:  Status is: inpatient   Consultants:  Cards ortho  Procedures: Left knee tap 6/14 Antimicrobials: None   Subjective:  He is resting in bed denies chest pain palpitations shortness of breath his only complaint is left knee swelling  though it is though it is better than yesterday.  He has not gotten out of bed since he came Objective: Vitals:   07/10/23 0700 07/10/23 0718 07/10/23 0731 07/10/23 0800  BP:  (!) 193/160 (!) 179/95 (!) 171/83  Pulse: 88 94 77 94  Resp: 17 19 (!) 21 20  Temp:   98.5 F (36.9 C)  TempSrc:   Oral   SpO2: 98% 100% 99% 96%  Weight:      Height:        Intake/Output Summary (Last 24 hours) at 07/10/2023 0922 Last data filed at 07/10/2023 0718 Gross per 24 hour  Intake 240 ml  Output 1725 ml  Net -1485 ml   Filed Weights   07/08/23 1151  Weight: 105.2 kg    Examination:  General exam: Appears calm and comfortable  Respiratory system: Clear to auscultation. Respiratory effort normal. Cardiovascular system: Irregular irregular. Gastrointestinal system: Abdomen is nondistended, soft and nontender. No  organomegaly or masses felt. Normal bowel sounds heard. Central nervous system: Alert and oriented. No focal neurological deficits. Extremities: Left knee swollen tender decreased edema    Data Reviewed: I have personally reviewed following labs and imaging studies  CBC: Recent Labs  Lab 07/08/23 1229 07/09/23 0321  WBC 6.4 8.9  NEUTROABS 4.1  --   HGB 11.7* 12.4*  HCT 36.0* 37.4*  MCV 81.3 81.0  PLT 217 209   Basic Metabolic Panel: Recent Labs  Lab 07/08/23 1229 07/08/23 1953 07/09/23 0321 07/09/23 0735 07/09/23 2343  NA 138  --  136  --  133*  K 3.2*  --  3.0*  --  3.9  CL 103  --  102  --  100  CO2 25  --  24  --  23  GLUCOSE 113*  --  149*  --  147*  BUN 12  --  13  --  14  CREATININE 0.76  --  0.88  --  0.91  CALCIUM 8.8*  --  9.0  --  9.5  MG  --  2.0  --  2.0 2.2  PHOS  --   --   --  2.8  --    GFR: Estimated Creatinine Clearance: 76.1 mL/min (by C-G formula based on SCr of 0.91 mg/dL). Liver Function Tests: No results for input(s): AST, ALT, ALKPHOS, BILITOT, PROT, ALBUMIN in the last 168 hours. No results for input(s): LIPASE, AMYLASE in the last 168 hours. No results for input(s): AMMONIA in the last 168 hours. Coagulation Profile: No results for input(s): INR, PROTIME in the last 168 hours. Cardiac Enzymes: No results for input(s): CKTOTAL, CKMB, CKMBINDEX, TROPONINI in the last 168 hours. BNP (last 3 results) No results for input(s): PROBNP in the last 8760 hours. HbA1C: No results for input(s): HGBA1C in the last 72 hours. CBG: No results for input(s): GLUCAP in the last 168 hours. Lipid Profile: No results for input(s): CHOL, HDL, LDLCALC, TRIG, CHOLHDL, LDLDIRECT in the last 72 hours. Thyroid  Function Tests: Recent Labs    07/09/23 0735  TSH 1.376   Anemia Panel: No results for input(s): VITAMINB12, FOLATE, FERRITIN, TIBC, IRON, RETICCTPCT in the last 72 hours. Sepsis Labs: No  results for input(s): PROCALCITON, LATICACIDVEN in the last 168 hours.  Recent Results (from the past 240 hours)  Body fluid culture w Gram Stain     Status: None (Preliminary result)   Collection Time: 07/08/23  2:20 PM   Specimen: Synovium; Body Fluid  Result Value Ref Range Status   Specimen Description   Final    SYNOVIAL Performed at [facility], [address], [place], [place] [address]    Special Requests   Final    KNEE Performed at [facility], [address] [place], [place] [address]    Gram Stain   Final    MODERATE WBC PRESENT, PREDOMINANTLY  PMN NO ORGANISMS SEEN    Culture   Final    NO GROWTH < 24 HOURS Performed at [facility], [address] [place], [place] [address]    Report Status PENDING  Incomplete  MRSA Next Gen by PCR, Nasal     Status: None   Collection Time: 07/09/23  7:24 AM   Specimen: Nasal Mucosa; Nasal Swab  Result Value Ref Range Status   MRSA by PCR Next Gen NOT DETECTED NOT DETECTED Final    Comment: (NOTE) The GeneXpert MRSA Assay (FDA approved for NASAL specimens only), is one component of a comprehensive MRSA colonization surveillance program. It is not intended to diagnose MRSA infection nor to guide or monitor treatment for MRSA infections. Test performance is not FDA approved in patients less than 52 years old. Performed at [facility], [address] [place], [place] [address]          Radiology Studies: VAS US  LOWER EXTREMITY VENOUS (DVT) (ONLY [facility] & [facility]) Result Date: 07/09/2023  Lower Venous DVT Study Patient Name:  TUDOR CHANDLEY  Date of Exam:   07/08/2023 Medical Rec #: [id number]         Accession #:    [id number] Date of Birth: 08-10-1940         Patient Gender: M Patient Age:   64 years Exam Location:  [facility] Procedure:      VAS US  LOWER EXTREMITY VENOUS (DVT) Referring Phys: Sonnie Dusky  --------------------------------------------------------------------------------  Indications: Swelling, and Pain of left knee. Unable to bear weight on leg. 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  Limitations: Patient unable to position knee secondary to pain. Comparison Study: No prior study Performing Technologist: Carleene Chase RVS  Examination Guidelines: A complete evaluation includes B-mode imaging, spectral Doppler, color Doppler, and power Doppler as needed of all accessible portions of each vessel. Bilateral testing is considered an integral part of a complete examination. Limited examinations for reoccurring indications may be performed as noted. The reflux portion of the exam is performed with the patient in reverse Trendelenburg.  +-----+---------------+---------+-----------+----------+--------------+ RIGHTCompressibilityPhasicitySpontaneityPropertiesThrombus Aging +-----+---------------+---------+-----------+----------+--------------+ CFV  Full           Yes      No                                  +-----+---------------+---------+-----------+----------+--------------+ SFJ  Full                                                        +-----+---------------+---------+-----------+----------+--------------+   +---------+---------------+---------+-----------+----------+-------------------+ LEFT     CompressibilityPhasicitySpontaneityPropertiesThrombus Aging      +---------+---------------+---------+-----------+----------+-------------------+ CFV      Full           Yes      No                                       +---------+---------------+---------+-----------+----------+-------------------+ SFJ      Full                                                             +---------+---------------+---------+-----------+----------+-------------------+  FV Prox  Full           Yes       No                                       +---------+---------------+---------+-----------+----------+-------------------+ FV Mid   Full           Yes      No                                       +---------+---------------+---------+-----------+----------+-------------------+ FV DistalFull                                                             +---------+---------------+---------+-----------+----------+-------------------+ PFV      Full           Yes      No                                       +---------+---------------+---------+-----------+----------+-------------------+ POP                     Yes      No                   patent by color and                                                       Doppler             +---------+---------------+---------+-----------+----------+-------------------+ PTV      Full                                                             +---------+---------------+---------+-----------+----------+-------------------+ PERO     Full                                                             +---------+---------------+---------+-----------+----------+-------------------+ TP trunk Full                                                             +---------+---------------+---------+-----------+----------+-------------------+     Summary: RIGHT: - No evidence of common femoral vein obstruction.   LEFT: - There is no evidence of deep vein thrombosis in the lower extremity.  - Effusion noted medial  left knee  *See table(s) above for measurements and observations. Electronically signed by Genny Kid MD on 07/09/2023 at 9:08:05 AM.    Final    DG Knee Complete 4 Views Left Result Date: 07/08/2023 CLINICAL DATA:  Left knee pain and swelling, inability to bear weight, warm to touch EXAM: LEFT KNEE - COMPLETE 4+ VIEW COMPARISON:  None Available. FINDINGS: Frontal, bilateral oblique, and lateral views of the left knee are  obtained. No acute fracture, subluxation, or dislocation. Severe 3 compartmental osteoarthritis greatest in the lateral compartment. Large suprapatellar joint effusion. Mild subcutaneous edema throughout the left knee. IMPRESSION: 1. Large left knee effusion. 2. Mild diffuse subcutaneous edema. 3. No acute or destructive bony abnormalities. 4. Severe 3 compartmental osteoarthritis, greatest laterally. Electronically Signed   By: Bobbye Burrow M.D.   On: 07/08/2023 13:26     Scheduled Meds:  allopurinol   50 mg Oral QHS   apixaban   5 mg Oral BID   Chlorhexidine Gluconate Cloth  6 each Topical Daily   diltiazem   300 mg Oral Daily   furosemide   40 mg Oral Daily   ibuprofen   800 mg Oral TID   loratadine   10 mg Oral QPM   losartan   100 mg Oral Daily   metoprolol  succinate  100 mg Oral q morning   metoprolol  succinate  50 mg Oral QPM   pravastatin  40 mg Oral q1800   tamsulosin   0.4 mg Oral Daily   Continuous Infusions:   LOS: 1 day    Barbee Lew, MD 07/10/2023, 9:22 AM

## 2023-07-10 NOTE — Progress Notes (Signed)
 Physical Therapy Treatment Patient Details Name: Jeffrey Carter MRN: [id number] DOB: 1940/05/06 Today's Date: 07/10/2023   History of Present Illness 83 year old male presents 07/08/23 with Left knee pain and inability to ambulate and  weight bear,  s/p aspiration.X-ray showed large left knee effusion mild diffuse subcutaneous edema severe tricompartmental osteoarthritis.  PMH: obstructive sleep apnea on CPA,P BPH, hypertension, obesity, prostate cancer, gout and paroxysmal atrial fibrillation.    PT Comments  Patient  seated in recliner using belt to assist with L knee ROM.  Patient stood from recliner with min assistance. Patient able to bear at least 50% Wb on the LLE and take 6 steps forward and backward using the ?Rw. Much improved with WB tolerance. HR 80's during activity. Patient most likely will progress to DC home with HHPT but will keep  as SNF in case. Noted to start steroids so he may progress  with decreased Left knee pain.     If plan is discharge home, recommend the following: Two people to help with walking and/or transfers;A little help with bathing/dressing/bathroom;Assistance with cooking/housework;Assist for transportation;Help with stairs or ramp for entrance   Can travel by private vehicle        Equipment Recommendations  Rolling walker (2 wheels)    Recommendations for Other Services OT consult     Precautions / Restrictions Precautions Precautions: Fall Precaution/Restrictions Comments: left knee pain is significant, monitor BP and HR both high Restrictions Weight Bearing Restrictions Per Provider Order: No     Mobility  Bed Mobility Overal bed mobility: Needs Assistance Bed Mobility: Supine to Sit     Supine to sit: Mod assist, HOB elevated, Used rails     General bed mobility comments: seated in recliner    Transfers Overall transfer level: Needs assistance Equipment used: Rolling walker (2 wheels) Transfers: Sit to/from Stand Sit to Stand:  Min assist   Step pivot transfers: Mod assist, +2 physical assistance, +2 safety/equipment       General transfer comment: cues to push from recliner on R, slow to rise but able to power up    Ambulation/Gait Ambulation/Gait assistance: Min assist Gait Distance (Feet): 6 Feet (forward and back) Assistive device: Rolling walker (2 wheels) Gait Pattern/deviations: Step-to pattern, Antalgic           Stairs             Wheelchair Mobility     Tilt Bed    Modified Rankin (Stroke Patients Only)       Balance Overall balance assessment: Needs assistance Sitting-balance support: No upper extremity supported, Feet supported Sitting balance-Leahy Scale: Fair     Standing balance support: Bilateral upper extremity supported, During functional activity, Reliant on assistive device for balance Standing balance-Leahy Scale: Fair                              Hotel manager: No apparent difficulties  Cognition Arousal: Alert Behavior During Therapy: WFL for tasks assessed/performed   PT - Cognitive impairments: No apparent impairments                         Following commands: Intact      Cueing    Exercises General Exercises - Lower Extremity Long Arc Quad: AAROM, Left, 10 reps (used belt)    General Comments        Pertinent Vitals/Pain Pain Assessment Pain Assessment: 0-10 Pain Score: 6  Pain Location: left knee Pain Descriptors / Indicators: Discomfort, Grimacing Pain Intervention(s): Repositioned    Home Living Family/patient expects to be discharged to:: Private residence Living Arrangements: Spouse/significant other Available Help at Discharge: Family;Available PRN/intermittently Type of Home: House Home Access: Stairs to enter Entrance Stairs-Rails: Doctor, general practice of Steps: 4   Home Layout: One level Home Equipment: Cane - single point Additional Comments: wife unable  to assit,    Prior Function            PT Goals (current goals can now be found in the care plan section) Acute Rehab PT Goals Patient Stated Goal: walk, no pain, PT Goal Formulation: With patient Time For Goal Achievement: 07/24/23 Potential to Achieve Goals: Good Progress towards PT goals: Progressing toward goals    Frequency    Min 3X/week      PT Plan      Co-evaluation              AM-PAC PT 6 Clicks Mobility   Outcome Measure  Help needed turning from your back to your side while in a flat bed without using bedrails?: A Little Help needed moving from lying on your back to sitting on the side of a flat bed without using bedrails?: A Little Help needed moving to and from a bed to a chair (including a wheelchair)?: A Little Help needed standing up from a chair using your arms (e.g., wheelchair or bedside chair)?: A Little Help needed to walk in hospital room?: Total Help needed climbing 3-5 steps with a railing? : Total 6 Click Score: 14    End of Session Equipment Utilized During Treatment: Gait belt Activity Tolerance: Patient tolerated treatment well Patient left: in chair;with call bell/phone within reach Nurse Communication: Mobility status PT Visit Diagnosis: Unsteadiness on feet (R26.81);Other abnormalities of gait and mobility (R26.89);Muscle weakness (generalized) (M62.81);Difficulty in walking, not elsewhere classified (R26.2);Pain Pain - Right/Left: Left Pain - part of body: Knee     Time: 2130-8657 PT Time Calculation (min) (ACUTE ONLY): 13 min  Charges:    $Gait Training: 8-22 mins PT General Charges $$ ACUTE PT VISIT: 1 Visit                     Abelina Hoes PT Acute Rehabilitation Services Office [phone]    Dareen Ebbing 07/10/2023, 2:45 PM

## 2023-07-10 NOTE — Plan of Care (Signed)
  Problem: Education: Goal: Knowledge of General Education information will improve Description: Including pain rating scale, medication(s)/side effects and non-pharmacologic comfort measures Outcome: Progressing   Problem: Health Behavior/Discharge Planning: Goal: Ability to manage health-related needs will improve Outcome: Progressing   Problem: Clinical Measurements: Goal: Will remain free from infection Outcome: Progressing Goal: Diagnostic test results will improve Outcome: Progressing Goal: Respiratory complications will improve Outcome: Progressing Goal: Cardiovascular complication will be avoided Outcome: Progressing   Problem: Activity: Goal: Risk for activity intolerance will decrease Outcome: Progressing   Problem: Nutrition: Goal: Adequate nutrition will be maintained Outcome: Progressing   Problem: Coping: Goal: Level of anxiety will decrease Outcome: Progressing   Problem: Elimination: Goal: Will not experience complications related to bowel motility Outcome: Progressing Goal: Will not experience complications related to urinary retention Outcome: Progressing   Problem: Pain Managment: Goal: General experience of comfort will improve and/or be controlled Outcome: Progressing

## 2023-07-10 NOTE — Progress Notes (Addendum)
 Progress Note  Patient Name: Jeffrey Carter Date of Encounter: 07/10/2023 Primary Cardiologist: New to Dr. Paulita Boss  Subjective   Overnight BP is still markedly elevated. No events overnight. Patient notes no CP, SOB, Palpitations. PAF with no sx when in AF  Vital Signs    Vitals:   07/10/23 0605 07/10/23 0700 07/10/23 0718 07/10/23 0731  BP: (!) 150/92  (!) 193/160 (!) 179/95  Pulse: 96 88 94 77  Resp: (!) 22 17 19  (!) 21  Temp:      TempSrc:      SpO2: 99% 98% 100% 99%  Weight:      Height:        Intake/Output Summary (Last 24 hours) at 07/10/2023 0751 Last data filed at 07/10/2023 0718 Gross per 24 hour  Intake 240 ml  Output 1725 ml  Net -1485 ml   Filed Weights   07/08/23 1151  Weight: 105.2 kg    Physical Exam   GEN: No acute distress.   Neck: No JVD Cardiac: Largely regular rhythm, systolic murmur no rubs, or gallops.  Respiratory: Clear to auscultation bilaterally. GI: Soft, nontender, non-distended  MS: No edema Left knee feels less hot to touch  Labs   Telemetry: AF- SR conversion ~ 1 PM, brief AF at midnight, SR with PVCs   Chemistry Recent Labs  Lab 07/08/23 1229 07/09/23 0321 07/09/23 2343  NA 138 136 133*  K 3.2* 3.0* 3.9  CL 103 102 100  CO2 25 24 23   GLUCOSE 113* 149* 147*  BUN 12 13 14   CREATININE 0.76 0.88 0.91  CALCIUM 8.8* 9.0 9.5  GFRNONAA >60 >60 >60  ANIONGAP 10 10 10      Hematology Recent Labs  Lab 07/08/23 1229 07/09/23 0321  WBC 6.4 8.9  RBC 4.43 4.62  HGB 11.7* 12.4*  HCT 36.0* 37.4*  MCV 81.3 81.0  MCH 26.4 26.8  MCHC 32.5 33.2  RDW 15.6* 15.2  PLT 217 209     Cardiac Studies   Cardiac Studies & Procedures   ______________________________________________________________________________________________     ECHOCARDIOGRAM  ECHOCARDIOGRAM COMPLETE 04/26/2023  Narrative ECHOCARDIOGRAM REPORT    Patient Name:   Jeffrey Carter Date of Exam: 04/26/2023 Medical Rec #:  [id number]         Height:       69.0 in Accession #:    [id number]       Weight:       229.0 lb Date of Birth:  1940/05/03        BSA:          2.188 m Patient Age:    82 years         BP:           165/80 mmHg Patient Gender: M                HR:           73 bpm. Exam Location:  Outpatient  Procedure: 3D Echo, 2D Echo, Color Doppler and Cardiac Doppler (Both Spectral and Color Flow Doppler were utilized during procedure).  Indications:    Atrial Fibrillation  History:        Patient has no prior history of Echocardiogram examinations. Arrythmias:Atrial Fibrillation and PAC; Risk Factors:Hypertension, Dyslipidemia and Former Smoker.  Sonographer:    Gelene Kelly RDCS Referring Phys: [id number] [place] R [place]  IMPRESSIONS   1. Left ventricular ejection fraction, by estimation, is 55 to 60%. Left ventricular ejection fraction by 3D volume  is 56 %. The left ventricle has normal function. The left ventricle has no regional wall motion abnormalities. Left ventricular diastolic function could not be evaluated. 2. Right ventricular systolic function is normal. The right ventricular size is normal. There is mildly elevated pulmonary artery systolic pressure. The estimated right ventricular systolic pressure is 42.3 mmHg. 3. The mitral valve is abnormal. Mild mitral valve regurgitation. 4. The aortic valve is tricuspid. Aortic valve regurgitation is not visualized. No aortic stenosis is present. 5. Aortic dilatation noted. There is moderate dilatation of the ascending aorta, measuring 45 mm. 6. The inferior vena cava is normal in size with <50% respiratory variability, suggesting right atrial pressure of 8 mmHg.  Comparison(s): No prior Echocardiogram.  FINDINGS Left Ventricle: Left ventricular ejection fraction, by estimation, is 55 to 60%. Left ventricular ejection fraction by 3D volume is 56 %. The left ventricle has normal function. The left ventricle has no regional wall motion abnormalities.  Global longitudinal strain performed but not reported based on interpreter judgement due to suboptimal tracking. The left ventricular internal cavity size was normal in size. There is no left ventricular hypertrophy. Left ventricular diastolic function could not be evaluated due to atrial fibrillation. Left ventricular diastolic function could not be evaluated.  Right Ventricle: The right ventricular size is normal. No increase in right ventricular wall thickness. Right ventricular systolic function is normal. There is mildly elevated pulmonary artery systolic pressure. The tricuspid regurgitant velocity is 2.93 m/s, and with an assumed right atrial pressure of 8 mmHg, the estimated right ventricular systolic pressure is 42.3 mmHg.  Left Atrium: Left atrial size was normal in size.  Right Atrium: Right atrial size was normal in size.  Pericardium: There is no evidence of pericardial effusion.  Mitral Valve: The mitral valve is abnormal. There is mild calcification of the anterior and posterior mitral valve leaflet(s). Mild mitral annular calcification. Mild mitral valve regurgitation.  Tricuspid Valve: The tricuspid valve is grossly normal. Tricuspid valve regurgitation is trivial.  Aortic Valve: The aortic valve is tricuspid. Aortic valve regurgitation is not visualized. No aortic stenosis is present.  Pulmonic Valve: The pulmonic valve was grossly normal. Pulmonic valve regurgitation is trivial.  Aorta: Aortic dilatation noted. There is moderate dilatation of the ascending aorta, measuring 45 mm.  Venous: The inferior vena cava is normal in size with less than 50% respiratory variability, suggesting right atrial pressure of 8 mmHg.  IAS/Shunts: No atrial level shunt detected by color flow Doppler.  Additional Comments: 3D was performed not requiring image post processing on an independent workstation and was normal.   LEFT VENTRICLE PLAX 2D LVIDd:         5.41 cm          Diastology LVIDs:         3.83 cm         LV e' medial:    5.77 cm/s LV PW:         0.87 cm         LV E/e' medial:  13.9 LV IVS:        0.93 cm         LV e' lateral:   7.51 cm/s LVOT diam:     2.00 cm         LV E/e' lateral: 10.7 LV SV:         43 LV SV Index:   20 LVOT Area:     3.14 cm        3D  Volume EF LV 3D EF:    Left ventricul LV Volumes (MOD)                            ar LV vol d, MOD    142.5 ml                   ejection A2C:                                        fraction LV vol d, MOD    151.9 ml                   by 3D A4C:                                        volume is LV vol s, MOD    61.0 ml                    56 %. A2C: LV vol s, MOD    67.5 ml A4C:                           3D Volume EF: LV SV MOD A2C:   81.5 ml       3D EF:        56 % LV SV MOD A4C:   151.9 ml      LV EDV:       213 ml LV SV MOD BP:    83.5 ml       LV ESV:       95 ml LV SV:        118 ml  RIGHT VENTRICLE RV Basal diam:  4.12 cm RV Mid diam:    2.78 cm RV S prime:     13.90 cm/s TAPSE (M-mode): 2.7 cm  LEFT ATRIUM             Index        RIGHT ATRIUM           Index LA diam:        5.00 cm 2.28 cm/m   RA Area:     20.50 cm LA Vol (A2C):   68.4 ml 31.26 ml/m  RA Volume:   63.10 ml  28.83 ml/m LA Vol (A4C):   66.1 ml 30.20 ml/m LA Biplane Vol: 71.2 ml 32.54 ml/m AORTIC VALVE LVOT Vmax:   66.00 cm/s LVOT Vmean:  43.200 cm/s LVOT VTI:    0.137 m  AORTA Ao Root diam: 3.40 cm  MITRAL VALVE               TRICUSPID VALVE MV Area (PHT): 3.42 cm    TR Peak grad:   34.3 mmHg MV Decel Time: 222 msec    TR Vmax:        293.00 cm/s MV E velocity: 80.40 cm/s MV A velocity: 60.30 cm/s  SHUNTS MV E/A ratio:  1.33        Systemic VTI:  0.14 m Systemic Diam: 2.00 cm  Dinah Franco MD Electronically signed by Dinah Franco MD Signature Date/Time: 04/26/2023/4:58:04 PM    Final    MONITORS  LONG TERM MONITOR (3-14 DAYS)  05/22/2023  Narrative Patch Wear Time:  13 days and  20 hours  HR 34 - 215, average 72 bpm. 1 nonsustained VT (longest 6 beats) Atrial fibrillation detected. Frequent supraventricular ectopy, 10%. Occasional ventricular ectopy, 2.9%. 14% atrial fibrillation burden, average HR in atrial fibrillation 98 BPM No symptom trigger episodes.  [facility] Cardiac Electrophysiology       ______________________________________________________________________________________________           Assessment & Plan   Paroxysmal atrial fibrillation - now in SR - likely triggered by acute illness related to knee effusion. Asymptomatic with preserved ejection fraction. Elevated blood pressure.  - When he converted to SR heart rates were not bradycardic; BP still elevated; I have increased to diltiazem  300 mg PO Daily XL - Metoprolol  succinate 100 mg; if BP is difficult to control can change to Coreg 25 mg PO BID (receiving 50 mg succinate this PM with no hypotension and no bradycardia despite spontaneous conversion) - Continue Eliquis  5 mg BID Orthopedic surgery does not plan for intervention per notes - Monitor heart rate and rhythm on telemetry. - he and his son, per chart review, planned for rate control strategy; this can be re-evaluated as outpatient at this time   Hypertension - Significantly elevated blood pressure despite SR - off home amlodipine  due to diltiazem  - I have resumed his home losartan    Moderate aortic aneurysm - need follow up imaging in 6 months (outpatient)   Mild mitral regurgitation - Mild mitral regurgitation noted on recent echocardiogram.    Aortic atherosclerosis - Aortic atherosclerosis incidentally found on renal study in August 2018. No acute symptoms or intervention required. - as outpatient will set LDL goal < 70   Hypokalemia - improved   As per primary/other consultants: Knee effusion Mild anemia  Will sign off - will arrange outpatient f/u - if refractory RVR, increase diltiazem  dose  and feel free to reach out    For questions or updates, please contact [facility] Please consult [email or web address] for contact info under Cardiology/STEMI.      Gloriann Larger, MD FASE [facility] Cardiologist [facility]  [address] [place], [address] [place], [place] [address] [phone]  7:51 AM

## 2023-07-11 DIAGNOSIS — I480 Paroxysmal atrial fibrillation: Secondary | ICD-10-CM | POA: Diagnosis not present

## 2023-07-11 DIAGNOSIS — G4733 Obstructive sleep apnea (adult) (pediatric): Secondary | ICD-10-CM

## 2023-07-11 DIAGNOSIS — M25462 Effusion, left knee: Secondary | ICD-10-CM | POA: Diagnosis not present

## 2023-07-11 DIAGNOSIS — E876 Hypokalemia: Secondary | ICD-10-CM

## 2023-07-11 DIAGNOSIS — I10 Essential (primary) hypertension: Secondary | ICD-10-CM

## 2023-07-11 LAB — BASIC METABOLIC PANEL WITH GFR
Anion gap: 13 (ref 5–15)
BUN: 53 mg/dL — ABNORMAL HIGH (ref 8–23)
CO2: 22 mmol/L (ref 22–32)
Calcium: 9.1 mg/dL (ref 8.9–10.3)
Chloride: 102 mmol/L (ref 98–111)
Creatinine, Ser: 1.66 mg/dL — ABNORMAL HIGH (ref 0.61–1.24)
GFR, Estimated: 41 mL/min — ABNORMAL LOW (ref >60)
Glucose, Bld: 187 mg/dL — ABNORMAL HIGH (ref 70–99)
Potassium: 3.6 mmol/L (ref 3.5–5.1)
Sodium: 137 mmol/L (ref 135–145)

## 2023-07-11 LAB — MAGNESIUM: Magnesium: 2.6 mg/dL — ABNORMAL HIGH (ref 1.7–2.4)

## 2023-07-11 LAB — RHEUMATOID FACTOR: Rheumatoid fact SerPl-aCnc: 12.2 [IU]/mL (ref <14.0)

## 2023-07-11 MED ORDER — DICLOFENAC SODIUM 1 % EX GEL
2.0000 g | Freq: Four times a day (QID) | CUTANEOUS | Status: DC
  Administered 2023-07-11 – 2023-07-14 (×14): 2 g via TOPICAL
  Filled 2023-07-11: qty 100

## 2023-07-11 MED ORDER — CHLORHEXIDINE GLUCONATE CLOTH 2 % EX PADS
6.0000 | MEDICATED_PAD | Freq: Every day | CUTANEOUS | Status: DC
  Administered 2023-07-11 – 2023-07-12 (×2): 6 via TOPICAL

## 2023-07-11 NOTE — NC FL2 (Signed)
 [place]  MEDICAID FL2 LEVEL OF CARE FORM     IDENTIFICATION  Patient Name: Jeffrey Carter Birthdate: Jan 26, 1940 Sex: male Admission Date (Current Location): 07/08/2023  [facility] and [place] Number:  Producer, television/film/video and Address:  [facility],  [address] [place], [place] [address]      Provider Number: [id number]  Attending Physician Name and Address:  [name], MD  Relative Name and Phone Number:  Merl Star (Daughter)  [phone]    Current Level of Care: Hospital Recommended Level of Care: Skilled Nursing Facility Prior Approval Number:    Date Approved/Denied:   PASRR Number: Pending  Discharge Plan: SNF    Current Diagnoses: Patient Active Problem List   Diagnosis Date Noted   Knee effusion, left 07/08/2023   Normocytic anemia 07/08/2023   Gout 07/08/2023   Paroxysmal atrial fibrillation (HCC) 04/24/2023   Hypercoagulable state due to paroxysmal atrial fibrillation (HCC) 04/24/2023   Nausea and vomiting 12/25/2022   Weight loss 12/08/2022   Gastroenteritis 12/01/2022   Hypertrophy of inferior nasal turbinate 11/28/2022   Obesity 09/14/2021   Allergic rhinitis 06/14/2021   Nail discoloration 02/09/2021   Hyperglycemia 09/08/2020   Upper respiratory disease 02/20/2019   Exposure to blood or body fluid 10/03/2018   RUQ pain 04/25/2017   Gout attack 06/08/2016   Foot pain, left 06/08/2016   Hypokalemia 04/21/2016   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   Dysuria 01/11/2016   PAC (premature atrial contraction) 06/18/2015   Acute sinusitis 12/31/2014   Urination frequency 09/15/2014   Cerumen impaction 07/14/2014   Prostate cancer (HCC) 04/14/2014   Low back pain radiating to right lower extremity 03/13/2014   Mass of ear canal 01/29/2014   Otitis, externa, infective 01/22/2014   Edema 07/05/2013   Erectile dysfunction 07/05/2013   Gout of big toe 03/23/2013   OSA (obstructive sleep apnea) 04/18/2012   Well adult exam 06/02/2011    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   EUSTACHIAN TUBE DYSFUNCTION, BILATERAL 03/15/2007   Dyslipidemia 10/27/2006   Essential hypertension 09/09/2006    Orientation RESPIRATION BLADDER Height & Weight     Self, Time, Situation, Place  Normal Continent Weight: 105.2 kg Height:  5' 9 (175.3 cm)  BEHAVIORAL SYMPTOMS/MOOD NEUROLOGICAL BOWEL NUTRITION STATUS      Continent Diet (Heart Healthy)  AMBULATORY STATUS COMMUNICATION OF NEEDS Skin   Extensive Assist Verbally Normal                       Personal Care Assistance Level of Assistance  Bathing, Dressing, Feeding Bathing Assistance: Limited assistance Feeding assistance: Independent Dressing Assistance: Limited assistance     Functional Limitations Info  Sight, Speech, Hearing Sight Info: Impaired Hearing Info: Adequate Speech Info: Adequate    SPECIAL CARE FACTORS FREQUENCY  PT (By licensed PT), OT (By licensed OT)     PT Frequency: 5xwk OT Frequency: 5xwk            Contractures Contractures Info: Not present    Additional Factors Info  Code Status, Allergies Code Status Info: FULL Allergies Info: Percocet (oxycodone -acetaminophen )           Current Medications (07/11/2023):  This is the current hospital active medication list Current Facility-Administered Medications  Medication Dose Route Frequency Provider Last Rate Last Admin   acetaminophen  (TYLENOL ) tablet 650 mg  650 mg Oral Q6H PRN Raymona Caldwell, MD   650 mg at 07/09/23 2205   Or   acetaminophen  (TYLENOL ) suppository 650 mg  650 mg Rectal Q6H PRN Raymona Caldwell, MD       allopurinol  (ZYLOPRIM ) tablet 50 mg  50 mg Oral QHS Mathews, Elizabeth G, MD   50 mg at 07/10/23 2124   apixaban  (ELIQUIS ) tablet 5 mg  5 mg Oral BID Raymona Caldwell, MD   5 mg at 07/11/23 4098   Chlorhexidine Gluconate Cloth 2 % PADS 6 each  6 each Topical Daily [name], MD   6 each at 07/11/23 0947   diltiazem  (CARDIZEM  CD) 24 hr capsule 300 mg  300 mg Oral Daily  Chandrasekhar, Mahesh A, MD   300 mg at 07/11/23 0931   furosemide  (LASIX ) tablet 40 mg  40 mg Oral Daily Raymona Caldwell, MD   40 mg at 07/11/23 0930   hydrALAZINE  (APRESOLINE ) injection 10 mg  10 mg Intravenous Q8H PRN Daniels, James K, NP   10 mg at 07/11/23 0650   HYDROcodone -acetaminophen  (NORCO) 7.5-325 MG per tablet 1 tablet  1 tablet Oral Q6H PRN Raymona Caldwell, MD   1 tablet at 07/10/23 0731   loratadine  (CLARITIN ) tablet 10 mg  10 mg Oral QPM Raymona Caldwell, MD   10 mg at 07/10/23 1730   losartan  (COZAAR ) tablet 100 mg  100 mg Oral Daily Chandrasekhar, Mahesh A, MD   100 mg at 07/11/23 0930   methocarbamol  (ROBAXIN ) tablet 500 mg  500 mg Oral BID PRN Raymona Caldwell, MD   500 mg at 07/08/23 2146   metoprolol  succinate (TOPROL -XL) 24 hr tablet 100 mg  100 mg Oral q morning Marlyse Single T, PA-C   100 mg at 07/11/23 0932   metoprolol  succinate (TOPROL -XL) 24 hr tablet 50 mg  50 mg Oral QPM Weaver, Scott T, PA-C   50 mg at 07/10/23 1730   metoprolol  tartrate (LOPRESSOR ) injection 5 mg  5 mg Intravenous Q6H PRN Mathews, Elizabeth G, MD   5 mg at 07/09/23 1305   morphine  (PF) 2 MG/ML injection 2 mg  2 mg Intravenous Q2H PRN Raymona Caldwell, MD   2 mg at 07/09/23 1129   ondansetron  (ZOFRAN ) tablet 4 mg  4 mg Oral Q6H PRN Raymona Caldwell, MD       Or   ondansetron  (ZOFRAN ) injection 4 mg  4 mg Intravenous Q6H PRN Raymona Caldwell, MD       pravastatin (PRAVACHOL) tablet 40 mg  40 mg Oral q1800 Raymona Caldwell, MD   40 mg at 07/10/23 1730   senna-docusate (Senokot-S) tablet 1 tablet  1 tablet Oral QHS PRN Raymona Caldwell, MD       tamsulosin  (FLOMAX ) capsule 0.4 mg  0.4 mg Oral Daily Raymona Caldwell, MD   0.4 mg at 07/11/23 0932     Discharge Medications: Please see discharge summary for a list of discharge medications.  Relevant Imaging Results:  Relevant Lab Results:   Additional Information SSN: [id number]  Tessie Fila, RN

## 2023-07-11 NOTE — Progress Notes (Signed)
 Physical Therapy Treatment Patient Details Name: Jeffrey Carter MRN: [id number] DOB: 10-27-1940 Today's Date: 07/11/2023   History of Present Illness 83 year old male presents 07/08/23 with Left knee pain and inability to ambulate and  weight bear,  s/p aspiration.X-ray showed large left knee effusion mild diffuse subcutaneous edema severe tricompartmental osteoarthritis.  PMH: obstructive sleep apnea on CPA,P BPH, hypertension, obesity, prostate cancer, gout and paroxysmal atrial fibrillation.    PT Comments  The patient demonstrates improved ambulation using the Rw, remains antalgic on the LLE. Patient ambulated x 30' x 2 with seated rest break. Patient will benefit from continued inpatient follow up therapy, <3 hours/day.  HR  noted  99-134 (briefly)     If plan is discharge home, recommend the following: A little help with walking and/or transfers;A little help with bathing/dressing/bathroom;Assistance with cooking/housework;Assist for transportation;Help with stairs or ramp for entrance   Can travel by private vehicle     Yes  Equipment Recommendations  Rolling walker (2 wheels)    Recommendations for Other Services       Precautions / Restrictions Precautions Precautions: Fall Precaution/Restrictions Comments: left knee pain is significant, monitor BP and HR both high Restrictions Weight Bearing Restrictions Per Provider Order: No     Mobility  Bed Mobility               General bed mobility comments: seated in recliner and returned to the same.    Transfers   Equipment used: Rolling walker (2 wheels) Transfers: Sit to/from Stand Sit to Stand: Min assist           General transfer comment: cues to push from recliner on R, slow to rise but able to power up    Ambulation/Gait Ambulation/Gait assistance: Min assist, +2 physical assistance Gait Distance (Feet): 30 Feet (then 30) Assistive device: Rolling walker (2 wheels) Gait Pattern/deviations: Step-to  pattern, Antalgic, Step-through pattern Gait velocity: decr     General Gait Details: gradually able to step through with use of RW   Stairs             Wheelchair Mobility     Tilt Bed    Modified Rankin (Stroke Patients Only)       Balance Overall balance assessment: Independent Sitting-balance support: No upper extremity supported, Feet supported Sitting balance-Leahy Scale: Good     Standing balance support: Bilateral upper extremity supported, During functional activity, Reliant on assistive device for balance Standing balance-Leahy Scale: Fair                              Hotel manager: No apparent difficulties  Cognition Arousal: Alert Behavior During Therapy: WFL for tasks assessed/performed   PT - Cognitive impairments: No apparent impairments                         Following commands: Intact      Cueing    Exercises      General Comments        Pertinent Vitals/Pain Pain Assessment Pain Score: 6  Pain Location: left knee Pain Descriptors / Indicators: Discomfort, Grimacing Pain Intervention(s): Monitored during session, Premedicated before session    Home Living Family/patient expects to be discharged to:: Private residence Living Arrangements: Spouse/significant other Available Help at Discharge: Family;Available PRN/intermittently Type of Home: House Home Access: Stairs to enter Entrance Stairs-Rails: Right;Left Entrance Stairs-Number of Steps: 4   Home Layout: One level  Home Equipment: Jeananne Mighty - single point Additional Comments: wife unable to assit,    Prior Function            PT Goals (current goals can now be found in the care plan section) Progress towards PT goals: Progressing toward goals    Frequency    Min 3X/week      PT Plan      Co-evaluation              AM-PAC PT 6 Clicks Mobility   Outcome Measure  Help needed turning from your back to  your side while in a flat bed without using bedrails?: A Little Help needed moving from lying on your back to sitting on the side of a flat bed without using bedrails?: A Little Help needed moving to and from a bed to a chair (including a wheelchair)?: A Little Help needed standing up from a chair using your arms (e.g., wheelchair or bedside chair)?: A Little Help needed to walk in hospital room?: A Lot Help needed climbing 3-5 steps with a railing? : A Lot 6 Click Score: 16    End of Session Equipment Utilized During Treatment: Gait belt Activity Tolerance: Patient tolerated treatment well Patient left: in chair;with call bell/phone within reach;with chair alarm set Nurse Communication: Mobility status PT Visit Diagnosis: Unsteadiness on feet (R26.81);Other abnormalities of gait and mobility (R26.89);Muscle weakness (generalized) (M62.81);Difficulty in walking, not elsewhere classified (R26.2);Pain Pain - Right/Left: Left Pain - part of body: Knee     Time: 7425-9563 PT Time Calculation (min) (ACUTE ONLY): 15 min  Charges:    $Gait Training: 8-22 mins PT General Charges $$ ACUTE PT VISIT: 1 Visit                     Jeffrey Carter PT Acute Rehabilitation Services Office [phone]    Jeffrey Carter 07/11/2023, 1:18 PM

## 2023-07-11 NOTE — TOC Progression Note (Signed)
 Transition of Care [facility]) - Progression Note    Patient Details  Name: Jeffrey Carter MRN: [id number] Date of Birth: Jun 01, 1940  Transition [facility] Care [facility]) CM/SW Contact  Tessie Fila, RN Phone Number: 07/11/2023, 10:45 AM  Clinical Narrative:    NCM met with pt at bedside to discuss PT recommendation for STR. Pt is agreeable to recommendation. Pt requests NCM speak with pt daughter Merl Star at [phone]. NCM spoke with daughter and she is agreeable to NCM sending out SNF referrals. Referrals faxed out for SNF, awaiting bed offers. TOC following.      Barriers to Discharge: Continued Medical Work up  Expected Discharge Plan and Services                                               Social Determinants of Health (SDOH) Interventions SDOH Screenings   Food Insecurity: No Food Insecurity (07/08/2023)  Housing: Low Risk  (07/08/2023)  Transportation Needs: No Transportation Needs (07/08/2023)  Utilities: Not At Risk (07/08/2023)  Alcohol Screen: Low Risk  (06/01/2023)  Depression (PHQ2-9): Low Risk  (07/04/2023)  Financial Resource Strain: Low Risk  (06/01/2023)  Physical Activity: Sufficiently Active (06/01/2023)  Social Connections: Socially Integrated (07/08/2023)  Stress: No Stress Concern Present (06/01/2023)  Tobacco Use: Medium Risk (07/08/2023)  Health Literacy: Adequate Health Literacy (06/01/2023)    Readmission Risk Interventions     No data to display

## 2023-07-11 NOTE — Plan of Care (Signed)
  Problem: Education: Goal: Knowledge of General Education information will improve Description: Including pain rating scale, medication(s)/side effects and non-pharmacologic comfort measures Outcome: Progressing   Problem: Health Behavior/Discharge Planning: Goal: Ability to manage health-related needs will improve Outcome: Progressing   Problem: Clinical Measurements: Goal: Ability to maintain clinical measurements within normal limits will improve Outcome: Progressing Goal: Will remain free from infection Outcome: Progressing Goal: Diagnostic test results will improve Outcome: Progressing   Problem: Education: Goal: Knowledge of General Education information will improve Description: Including pain rating scale, medication(s)/side effects and non-pharmacologic comfort measures Outcome: Progressing   Problem: Health Behavior/Discharge Planning: Goal: Ability to manage health-related needs will improve Outcome: Progressing   Problem: Clinical Measurements: Goal: Ability to maintain clinical measurements within normal limits will improve Outcome: Progressing Goal: Will remain free from infection Outcome: Progressing Goal: Diagnostic test results will improve Outcome: Progressing   

## 2023-07-11 NOTE — Evaluation (Signed)
 Occupational Therapy Evaluation Patient Details Name: Jeffrey Carter MRN: [id number] DOB: Sep 15, 1940 Today's Date: 07/11/2023   History of Present Illness   83 year old male presents 07/08/23 with Left knee pain and inability to ambulate and  weight bear,  s/p aspiration.X-ray showed large left knee effusion mild diffuse subcutaneous edema severe tricompartmental osteoarthritis.  PMH: obstructive sleep apnea on CPA,P BPH, hypertension, obesity, prostate cancer, gout and paroxysmal atrial fibrillation.     Clinical Impressions Patient is a 83 year old male who was admitted for above. Patient was independent with ADLs prior level. Currently, patient is max A for LB Adls with increased pain in LLE. Patient was noted to have decreased functional activity tolerance, decreased endurance, decreased standing balance, decreased safety awareness, and decreased knowledge of AD/AE impacting participation in ADLs. Patient will benefit from continued inpatient follow up therapy, <3 hours/day.      If plan is discharge home, recommend the following:   A lot of help with bathing/dressing/bathroom;Assistance with cooking/housework;Direct supervision/assist for medications management;Assist for transportation;Direct supervision/assist for financial management;Help with stairs or ramp for entrance;A little help with walking and/or transfers     Functional Status Assessment   Patient has had a recent decline in their functional status and demonstrates the ability to make significant improvements in function in a reasonable and predictable amount of time.     Equipment Recommendations   None recommended by OT      Precautions/Restrictions   Precautions Precautions: Fall Precaution/Restrictions Comments: left knee pain is significant, monitor BP and HR both high Restrictions Weight Bearing Restrictions Per Provider Order: No     Mobility Bed Mobility               General bed mobility  comments: seated in recliner and returned to the same.         Balance Overall balance assessment: Needs assistance Sitting-balance support: No upper extremity supported, Feet supported Sitting balance-Leahy Scale: Fair     Standing balance support: Bilateral upper extremity supported, During functional activity, Reliant on assistive device for balance Standing balance-Leahy Scale: Fair           ADL either performed or assessed with clinical judgement   ADL Overall ADL's : Needs assistance/impaired Eating/Feeding: Sitting;Supervision/ safety   Grooming: Sitting;Set up   Upper Body Bathing: Sitting;Set up;Supervision/ safety   Lower Body Bathing: Sitting/lateral leans;Maximal assistance   Upper Body Dressing : Sitting;Set up   Lower Body Dressing: Sitting/lateral leans;Maximal assistance Lower Body Dressing Details (indicate cue type and reason): unable to figure four either leg. Toilet Transfer: Minimal assistance;+2 for safety/equipment;Ambulation;Rolling walker (2 wheels) Toilet Transfer Details (indicate cue type and reason): with increased discomfort with standing on BLE. Toileting- Clothing Manipulation and Hygiene: Sitting/lateral lean;Maximal assistance               Vision   Vision Assessment?: No apparent visual deficits            Pertinent Vitals/Pain Pain Assessment Pain Assessment: 0-10 Pain Score: 6  Pain Location: left knee Pain Descriptors / Indicators: Discomfort, Grimacing Pain Intervention(s): Repositioned, Premedicated before session     Extremity/Trunk Assessment Upper Extremity Assessment Upper Extremity Assessment: Overall WFL for tasks assessed   Lower Extremity Assessment Lower Extremity Assessment: Defer to PT evaluation   Cervical / Trunk Assessment Cervical / Trunk Assessment: Normal   Communication Communication Communication: No apparent difficulties   Cognition Arousal: Alert Behavior During Therapy: WFL for tasks  assessed/performed Cognition: No apparent impairments  Following commands: Intact                  Home Living Family/patient expects to be discharged to:: Private residence Living Arrangements: Spouse/significant other Available Help at Discharge: Family;Available PRN/intermittently Type of Home: House Home Access: Stairs to enter [employer] of Steps: 4 Entrance Stairs-Rails: Right;Left Home Layout: One level     Bathroom Shower/Tub: Tub/shower unit         Home Equipment: Jeananne Mighty - single point   Additional Comments: wife unable to assit,      Prior Functioning/Environment Prior Level of Function : Independent/Modified Independent;Driving      OT Problem List: Decreased strength;Impaired balance (sitting and/or standing);Decreased knowledge of precautions;Decreased safety awareness;Decreased activity tolerance;Decreased knowledge of use of DME or AE   OT Treatment/Interventions: Self-care/ADL training;DME and/or AE instruction;Therapeutic activities;Balance training;Therapeutic exercise;Energy conservation;Patient/family education      OT Goals(Current goals can be found in the care plan section)   Acute Rehab OT Goals Patient Stated Goal: to get better OT Goal Formulation: With patient Time For Goal Achievement: 07/25/23 Potential to Achieve Goals: Fair   OT Frequency:  Min 2X/week       AM-PAC OT 6 Clicks Daily Activity     Outcome Measure Help from another person eating meals?: A Little Help from another person taking care of personal grooming?: A Little Help from another person toileting, which includes using toliet, bedpan, or urinal?: A Lot Help from another person bathing (including washing, rinsing, drying)?: A Lot Help from another person to put on and taking off regular upper body clothing?: A Little Help from another person to put on and taking off regular lower body clothing?: A Lot 6 Click Score: 15   End of Session  Equipment Utilized During Treatment: Gait belt;Rolling walker (2 wheels) Nurse Communication: Mobility status  Activity Tolerance: Patient tolerated treatment well Patient left: in chair;with call bell/phone within reach;with chair alarm set  OT Visit Diagnosis: Unsteadiness on feet (R26.81);Other abnormalities of gait and mobility (R26.89);Muscle weakness (generalized) (M62.81)                Time: 5784-6962 OT Time Calculation (min): 12 min Charges:  OT General Charges $OT Visit: 1 Visit OT Evaluation $OT Eval Moderate Complexity: 1 Mod  Honestii Marton OTR/L, MS Acute Rehabilitation Department Office# [phone]   Jame Maze 07/11/2023, 12:54 PM

## 2023-07-11 NOTE — Progress Notes (Signed)
 [facility]  PROGRESS NOTE  Jeffrey Carter GNF:[id number] DOB: 1940/10/21 DOA: 07/08/2023 PCP: Jeffrey Kettering, MD   Brief HPI:   83 year old male with history of obstructive sleep apnea on CPAP BPH hypertension obesity, prostate cancer status post radiation history of gout and paroxysmal atrial fibrillation on metoprolol  and Eliquis  at home admitted with left knee swelling and pain.  X-ray showed large left knee effusion mild diffuse subcutaneous edema severe tricompartmental osteoarthritis. ED physician drained left knee and sent for studies.    Assessment/Plan:   #1 left knee swelling and pain due to severe osteoarthritis.   x-ray showing severe osteoarthritis of the left knee. ED physician aspirated left knee fluid appears cloudy 5000 WBCs 95 neutrophils.  Gram stain moderate WBC predominantly polymorphonuclear leukocytes no organisms and no growth.  Patient remains afebrile with no leukocytosis.  Patient received Decadron  with improvement in pain though range of motion still impaired. NSAID for 3 days. If no improvement will get CT or MRI of the left knee. Venous Doppler no DVT Ortho consulted by ED no new recs other than outpatient follow-up PT consult   #2 A-fib RVR patient with history of paroxysmal A-fib  On Eliquis  and metoprolol  25 daily prior to admission Cardiology consulted signed off 07/10/2023 Increased metoprolol  200 in the morning and 50 at night, added Cardizem  300 mg daily. Normal TSH. TSH 1.37 Echo April 2025 EF 55 to 60%   #3 hypokalemia  replete    #4 history of gout  continue  allopurinol    #5 obstructive sleep apnea  on CPAP at home   #6 hyperlipidemia  on statin   #7 BPH on Flomax     Medications     allopurinol   50 mg Oral QHS   apixaban   5 mg Oral BID   Chlorhexidine Gluconate Cloth  6 each Topical Daily   diltiazem   300 mg Oral Daily   furosemide   40 mg Oral Daily   loratadine   10 mg Oral QPM   losartan   100 mg Oral Daily    metoprolol  succinate  100 mg Oral q morning   metoprolol  succinate  50 mg Oral QPM   pravastatin  40 mg Oral q1800   tamsulosin   0.4 mg Oral Daily     Data Reviewed:   CBG:  No results for input(s): GLUCAP in the last 168 hours.  SpO2: 98 % O2 Flow Rate (L/min): 3 L/min FiO2 (%): 21 %    Vitals:   07/11/23 0400 07/11/23 0500 07/11/23 0600 07/11/23 0700  BP: (!) 184/93 (!) 167/86 (!) 177/82 (!) 158/70  Pulse: 78 76 68 73  Resp: 17 14 19 16   Temp:      TempSrc:      SpO2: 98% 100% 95% 98%  Weight:      Height:          Data Reviewed:  Basic Metabolic Panel: Recent Labs  Lab 07/08/23 1229 07/08/23 1953 07/09/23 0321 07/09/23 0735 07/09/23 2343  NA 138  --  136  --  133*  K 3.2*  --  3.0*  --  3.9  CL 103  --  102  --  100  CO2 25  --  24  --  23  GLUCOSE 113*  --  149*  --  147*  BUN 12  --  13  --  14  CREATININE 0.76  --  0.88  --  0.91  CALCIUM 8.8*  --  9.0  --  9.5  MG  --  2.0  --  2.0 2.2  PHOS  --   --   --  2.8  --     CBC: Recent Labs  Lab 07/08/23 1229 07/09/23 0321  WBC 6.4 8.9  NEUTROABS 4.1  --   HGB 11.7* 12.4*  HCT 36.0* 37.4*  MCV 81.3 81.0  PLT 217 209    LFT No results for input(s): AST, ALT, ALKPHOS, BILITOT, PROT, ALBUMIN in the last 168 hours.   Antibiotics: Anti-infectives (From admission, onward)    None        DVT prophylaxis: Apixaban   Code Status: Full code  Family Communication: No family at bedside   CONSULTS cardiology   Subjective   Still has left knee swelling.  Though swelling is improving.  Denies pain.   Objective    Physical Examination:   General-appears in no acute distress Heart-S1-S2, regular, no murmur auscultated Lungs-clear to auscultation bilaterally, no wheezing or crackles auscultated Abdomen-soft, nontender, no organomegaly Extremities-no edema in the lower extremities Neuro-alert, oriented x3, no focal deficit noted  Status is: Inpatient:              Jeffrey Carter   [facility] If 7PM-7AM, please contact night-coverage at [email or web address], Office  [phone]   07/11/2023, 7:39 AM  LOS: 2 days

## 2023-07-12 DIAGNOSIS — D6859 Other primary thrombophilia: Secondary | ICD-10-CM | POA: Insufficient documentation

## 2023-07-12 DIAGNOSIS — N400 Enlarged prostate without lower urinary tract symptoms: Secondary | ICD-10-CM | POA: Insufficient documentation

## 2023-07-12 DIAGNOSIS — M179 Osteoarthritis of knee, unspecified: Secondary | ICD-10-CM | POA: Insufficient documentation

## 2023-07-12 DIAGNOSIS — N179 Acute kidney failure, unspecified: Secondary | ICD-10-CM

## 2023-07-12 DIAGNOSIS — R339 Retention of urine, unspecified: Secondary | ICD-10-CM | POA: Insufficient documentation

## 2023-07-12 DIAGNOSIS — M25562 Pain in left knee: Secondary | ICD-10-CM | POA: Insufficient documentation

## 2023-07-12 DIAGNOSIS — D649 Anemia, unspecified: Secondary | ICD-10-CM

## 2023-07-12 DIAGNOSIS — E785 Hyperlipidemia, unspecified: Secondary | ICD-10-CM | POA: Insufficient documentation

## 2023-07-12 DIAGNOSIS — I480 Paroxysmal atrial fibrillation: Secondary | ICD-10-CM | POA: Diagnosis not present

## 2023-07-12 DIAGNOSIS — I719 Aortic aneurysm of unspecified site, without rupture: Secondary | ICD-10-CM | POA: Insufficient documentation

## 2023-07-12 DIAGNOSIS — M25462 Effusion, left knee: Secondary | ICD-10-CM | POA: Diagnosis not present

## 2023-07-12 DIAGNOSIS — Z7901 Long term (current) use of anticoagulants: Secondary | ICD-10-CM | POA: Insufficient documentation

## 2023-07-12 LAB — BODY FLUID CULTURE W GRAM STAIN

## 2023-07-12 LAB — BASIC METABOLIC PANEL WITH GFR
Anion gap: 10 (ref 5–15)
BUN: 61 mg/dL — ABNORMAL HIGH (ref 8–23)
CO2: 23 mmol/L (ref 22–32)
Calcium: 8.8 mg/dL — ABNORMAL LOW (ref 8.9–10.3)
Chloride: 103 mmol/L (ref 98–111)
Creatinine, Ser: 1.91 mg/dL — ABNORMAL HIGH (ref 0.61–1.24)
GFR, Estimated: 35 mL/min — ABNORMAL LOW (ref >60)
Glucose, Bld: 179 mg/dL — ABNORMAL HIGH (ref 70–99)
Potassium: 3.4 mmol/L — ABNORMAL LOW (ref 3.5–5.1)
Sodium: 136 mmol/L (ref 135–145)

## 2023-07-12 MED ORDER — APIXABAN 2.5 MG PO TABS
2.5000 mg | ORAL_TABLET | Freq: Two times a day (BID) | ORAL | Status: DC
  Administered 2023-07-12 – 2023-07-14 (×4): 2.5 mg via ORAL
  Filled 2023-07-12 (×4): qty 1

## 2023-07-12 MED ORDER — LACTATED RINGERS IV SOLN: INTRAVENOUS | Status: AC

## 2023-07-12 MED ORDER — POTASSIUM CHLORIDE CRYS ER 20 MEQ PO TBCR
20.0000 meq | EXTENDED_RELEASE_TABLET | Freq: Once | ORAL | Status: AC
  Administered 2023-07-12: 20 meq via ORAL
  Filled 2023-07-12: qty 1

## 2023-07-12 NOTE — Progress Notes (Signed)
 Physical Therapy Treatment Patient Details Name: Jeffrey Carter MRN: [id number] DOB: 29-Jan-1940 Today's Date: 07/12/2023   History of Present Illness 83 year old male presents 07/08/23 with Left knee pain and inability to ambulate and  weight bear,  s/p aspiration.X-ray showed large left knee effusion mild diffuse subcutaneous edema severe tricompartmental osteoarthritis.  PMH: obstructive sleep apnea on CPA,P BPH, hypertension, obesity, prostate cancer, gout and paroxysmal atrial fibrillation.    PT Comments   Pt admitted with above diagnosis.  Pt currently with functional limitations due to the deficits listed below (see PT Problem List). Pt in bed when PT arrived. Pt agreeable to therapy intervention. Pt is motivated to progress toward d/c home. Pt c/o L knee pain 6/10 with noted L knee edema. Pt required use of hospital bed and S for supine to sit, CGA for sit to stand from EOB, gait tasks in hallway with RW, CGA and cues for 100 feet no reports of SOB nor increased L knee pain with mobility, pt elected to return to bed and able to complete supine to sit Mod I with use of hospital bed. Pending pt progress with therapy pt may be able to safely transition home with HH vs short term rehab, < 3 hr/day. Pt will benefit from acute skilled PT to increase their independence and safety with mobility to allow discharge.      If plan is discharge home, recommend the following: A little help with walking and/or transfers;A little help with bathing/dressing/bathroom;Assistance with cooking/housework;Assist for transportation;Help with stairs or ramp for entrance   Can travel by private vehicle     Yes  Equipment Recommendations  Rolling walker (2 wheels)    Recommendations for Other Services       Precautions / Restrictions Precautions Precautions: Fall Precaution/Restrictions Comments: left knee pain is significant, monitor BP and HR both high Restrictions Weight Bearing Restrictions Per Provider  Order: No     Mobility  Bed Mobility Overal bed mobility: Needs Assistance Bed Mobility: Supine to Sit, Sit to Supine     Supine to sit: HOB elevated, Used rails, Supervision Sit to supine: Used rails, Modified independent (Device/Increase time)   General bed mobility comments: min cues    Transfers Overall transfer level: Needs assistance Equipment used: Rolling walker (2 wheels) Transfers: Sit to/from Stand Sit to Stand: Contact guard assist           General transfer comment: one UE push to stand min cues and pt able to power up with min cues for posture once in standing    Ambulation/Gait Ambulation/Gait assistance: Contact guard assist Gait Distance (Feet): 100 Feet Assistive device: Rolling walker (2 wheels) Gait Pattern/deviations: Step-to pattern, Antalgic Gait velocity: decreased     General Gait Details: step almost through pattern, decreased L LE stance time and slight L knee flexion throughout gait cycle, min cues for safety and RW management   Stairs             Wheelchair Mobility     Tilt Bed    Modified Rankin (Stroke Patients Only)       Balance Overall balance assessment: Independent Sitting-balance support: No upper extremity supported, Feet supported Sitting balance-Leahy Scale: Good     Standing balance support: Bilateral upper extremity supported, During functional activity, Reliant on assistive device for balance Standing balance-Leahy Scale: Fair  Communication Communication Communication: No apparent difficulties  Cognition Arousal: Alert Behavior During Therapy: WFL for tasks assessed/performed   PT - Cognitive impairments: No apparent impairments                         Following commands: Intact      Cueing    Exercises      General Comments        Pertinent Vitals/Pain Pain Assessment Pain Assessment: 0-10 Pain Score: 6  Pain Location: left  knee Pain Descriptors / Indicators: Discomfort, Grimacing Pain Intervention(s): Limited activity within patient's tolerance, Monitored during session, Premedicated before session, Repositioned    Home Living                          Prior Function            PT Goals (current goals can now be found in the care plan section) Acute Rehab PT Goals Patient Stated Goal: walk, no pain, PT Goal Formulation: With patient Time For Goal Achievement: 07/24/23 Potential to Achieve Goals: Good Progress towards PT goals: Progressing toward goals    Frequency    Min 3X/week      PT Plan      Co-evaluation              AM-PAC PT 6 Clicks Mobility   Outcome Measure  Help needed turning from your back to your side while in a flat bed without using bedrails?: A Little Help needed moving from lying on your back to sitting on the side of a flat bed without using bedrails?: A Little Help needed moving to and from a bed to a chair (including a wheelchair)?: A Little Help needed standing up from a chair using your arms (e.g., wheelchair or bedside chair)?: A Little Help needed to walk in hospital room?: A Lot Help needed climbing 3-5 steps with a railing? : A Lot 6 Click Score: 16    End of Session Equipment Utilized During Treatment: Gait belt Activity Tolerance: Patient tolerated treatment well Patient left: with call bell/phone within reach;in bed;with bed alarm set Nurse Communication: Mobility status PT Visit Diagnosis: Unsteadiness on feet (R26.81);Other abnormalities of gait and mobility (R26.89);Muscle weakness (generalized) (M62.81);Difficulty in walking, not elsewhere classified (R26.2);Pain Pain - Right/Left: Left Pain - part of body: Knee     Time: 1610-9604 PT Time Calculation (min) (ACUTE ONLY): 12 min  Charges:    $Gait Training: 8-22 mins PT General Charges $$ ACUTE PT VISIT: 1 Visit                     Cary Clarks, PT Acute Rehab    Annalee Kiang 07/12/2023, 3:01 PM

## 2023-07-12 NOTE — TOC Progression Note (Addendum)
 Transition of Care [facility]) - Progression Note    Patient Details  Name: Jeffrey Carter MRN: [id number] Date of Birth: 03-13-1940  Transition of Care [facility]) CM/SW Contact  Tessie Fila, RN Phone Number: 07/12/2023, 10:22 AM  Clinical Narrative:    Met with pt to offer SNF bed offers. Pt would like to choose a bed at [facility]. Spoke with Soy at [facility] and she will have a bed. Will start insurance auth. TOC following.  Addendum: Insurance auth started, pending.  [facility] SN[facility]  [address] [place] [address] [phone]  4 stars  [facility] Preferred SNF  [address] [place] [address] [phone]  3 stars  [address] [place], [place] Preferred SNF  [address], [place] [address] [phone]  4 stars  [facility], [place] Preferred SNF  [address] [place] [address] [phone]  5 stars  [facility] Preferred SNF  [address], [place] [address] [phone]  5 stars      Barriers to Discharge: Continued Medical Work up  Expected Discharge Plan and Services                                               Social Determinants of Health (SDOH) Interventions SDOH Screenings   Food Insecurity: No Food Insecurity (07/08/2023)  Housing: Low Risk  (07/08/2023)  Transportation Needs: No Transportation Needs (07/08/2023)  Utilities: Not At Risk (07/08/2023)  Alcohol Screen: Low Risk  (06/01/2023)  Depression (PHQ2-9): Low Risk  (07/04/2023)  Financial Resource Strain: Low Risk  (06/01/2023)  Physical Activity: Sufficiently Active (06/01/2023)  Social Connections: Socially Integrated (07/08/2023)  Stress: No Stress Concern Present (06/01/2023)  Tobacco Use: Medium Risk (07/08/2023)  Health Literacy: Adequate Health Literacy (06/01/2023)    Readmission Risk Interventions     No data to display

## 2023-07-12 NOTE — TOC Progression Note (Signed)
 Transition of Care [facility]) - Progression Note    Patient Details  Name: NORVILLE DANI MRN: [id number] Date of Birth: 11-04-1940  Transition of Care [facility]) CM/SW Contact  Tessie Fila, RN Phone Number: 07/12/2023, 3:32 PM  Clinical Narrative:    Insurance [employer] has been approved. Once pt is medically ready he has a bed at [facility].  Plan auth ID: [id number] Auth ID: [id number] SNF 07/12/2023 07/13/2023-07/17/2023 Approved 5/5/0 07/17/2023     Barriers to Discharge: Continued Medical Work up  Expected Discharge Plan and Services                                               Social Determinants of Health (SDOH) Interventions SDOH Screenings   Food Insecurity: No Food Insecurity (07/08/2023)  Housing: Low Risk  (07/08/2023)  Transportation Needs: No Transportation Needs (07/08/2023)  Utilities: Not At Risk (07/08/2023)  Alcohol Screen: Low Risk  (06/01/2023)  Depression (PHQ2-9): Low Risk  (07/04/2023)  Financial Resource Strain: Low Risk  (06/01/2023)  Physical Activity: Sufficiently Active (06/01/2023)  Social Connections: Socially Integrated (07/08/2023)  Stress: No Stress Concern Present (06/01/2023)  Tobacco Use: Medium Risk (07/08/2023)  Health Literacy: Adequate Health Literacy (06/01/2023)    Readmission Risk Interventions     No data to display

## 2023-07-12 NOTE — Progress Notes (Signed)
 [facility]  PROGRESS NOTE  Jeffrey Carter ZOX:[id number] DOB: 07-14-1940 DOA: 07/08/2023 PCP: Genia Kettering, MD   Brief HPI:   83 year old male with history of obstructive sleep apnea on CPAP BPH hypertension obesity, prostate cancer status post radiation history of gout and paroxysmal atrial fibrillation on metoprolol  and Eliquis  at home admitted with left knee swelling and pain.  X-ray showed large left knee effusion mild diffuse subcutaneous edema severe tricompartmental osteoarthritis. ED physician drained left knee and sent for studies.    Assessment/Plan:   #1 left knee swelling and pain due to severe osteoarthritis.   x-ray showing severe osteoarthritis of the left knee. ED physician aspirated left knee fluid appears cloudy 5000 WBCs 95 neutrophils.  Gram stain moderate WBC predominantly polymorphonuclear leukocytes no organisms and no growth.  Patient remains afebrile with no leukocytosis.  Patient received Decadron  with improvement in pain though range of motion still impaired. If no improvement will get CT or MRI of the left knee. Venous Doppler no DVT Ortho consulted by ED no new recs other than outpatient follow-up PT consult; plan to go to skilled facility -Started on  Voltaren  gel 4 times daily   #2 A-fib RVR patient with history of paroxysmal A-fib  On Eliquis  and metoprolol  25 daily prior to admission Cardiology consulted signed off 07/10/2023 Increased metoprolol  200 in the morning and 50 at night, added Cardizem  300 mg daily. Normal TSH. TSH 1.37 Echo April 2025 EF 55 to 60%   #3 hypokalemia  replete    #4 history of gout  continue  allopurinol    #5 obstructive sleep apnea  on CPAP at home   #6 hyperlipidemia  on statin   #7 BPH on Flomax   #8 AKI - BUN/creatinine is now elevated -Likely in setting of diuretic use - Start LR at 75 mL/h - Follow renal function in a.m.    Medications     allopurinol   50 mg Oral QHS   apixaban   5 mg  Oral BID   Chlorhexidine Gluconate Cloth  6 each Topical Daily   diclofenac  Sodium  2 g Topical QID   diltiazem   300 mg Oral Daily   loratadine   10 mg Oral QPM   losartan   100 mg Oral Daily   metoprolol  succinate  100 mg Oral q morning   metoprolol  succinate  50 mg Oral QPM   pravastatin  40 mg Oral q1800   tamsulosin   0.4 mg Oral Daily     Data Reviewed:   CBG:  No results for input(s): GLUCAP in the last 168 hours.  SpO2: 96 % O2 Flow Rate (L/min): 2 L/min FiO2 (%): 21 %    Vitals:   07/12/23 0500 07/12/23 0600 07/12/23 0829 07/12/23 0925  BP: (!) 147/66 (!) 160/83  (!) 151/71  Pulse: 85 94  98  Resp: (!) 22 (!) 21    Temp:   98.4 F (36.9 C)   TempSrc:      SpO2: 95% 96%    Weight:      Height:          Data Reviewed:  Basic Metabolic Panel: Recent Labs  Lab 07/08/23 1229 07/08/23 1953 07/09/23 0321 07/09/23 0735 07/09/23 2343 07/11/23 2224  NA 138  --  136  --  133* 137  K 3.2*  --  3.0*  --  3.9 3.6  CL 103  --  102  --  100 102  CO2 25  --  24  --  23 22  GLUCOSE 113*  --  149*  --  147* 187*  BUN 12  --  13  --  14 53*  CREATININE 0.76  --  0.88  --  0.91 1.66*  CALCIUM 8.8*  --  9.0  --  9.5 9.1  MG  --  2.0  --  2.0 2.2 2.6*  PHOS  --   --   --  2.8  --   --     CBC: Recent Labs  Lab 07/08/23 1229 07/09/23 0321  WBC 6.4 8.9  NEUTROABS 4.1  --   HGB 11.7* 12.4*  HCT 36.0* 37.4*  MCV 81.3 81.0  PLT 217 209    LFT No results for input(s): AST, ALT, ALKPHOS, BILITOT, PROT, ALBUMIN in the last 168 hours.   Antibiotics: Anti-infectives (From admission, onward)    None        DVT prophylaxis: Apixaban   Code Status: Full code  Family Communication: No family at bedside   CONSULTS cardiology   Subjective   Knee pain and swelling has improved.   Objective    Physical Examination:  Appears in no acute distress Left knee is swollen, no erythema or tenderness to palpation Lungs are clear to  auscultation bilaterally Heart irregularly irregular rhythm  Status is: Inpatient:             Jeffrey Carter   [facility] If 7PM-7AM, please contact night-coverage at [email or web address], Office  [phone]   07/12/2023, 11:35 AM  LOS: 3 days

## 2023-07-12 NOTE — Progress Notes (Signed)
 Occupational Therapy Treatment Patient Details Name: Jeffrey Carter MRN: [id number] DOB: 1940/02/11 Today's Date: 07/12/2023   History of present illness 83 year old male presents 07/08/23 with Left knee pain and inability to ambulate and  weight bear,  s/p aspiration.X-ray showed large left knee effusion mild diffuse subcutaneous edema severe tricompartmental osteoarthritis.  PMH: obstructive sleep apnea on CPA,P BPH, hypertension, obesity, prostate cancer, gout and paroxysmal atrial fibrillation.   OT comments  Patient engaged in transfers to and from bathroom and increased functional mobility per patient request. Patient continues to have increased pain in knee during session. Patient will benefit from continued inpatient follow up therapy, <3 hours/day.       If plan is discharge home, recommend the following:  A lot of help with bathing/dressing/bathroom;Assistance with cooking/housework;Direct supervision/assist for medications management;Assist for transportation;Direct supervision/assist for financial management;Help with stairs or ramp for entrance;A little help with walking and/or transfers   Equipment Recommendations  None recommended by OT       Precautions / Restrictions Precautions Precautions: Fall Precaution/Restrictions Comments: left knee pain is significant, monitor BP and HR both high Restrictions Weight Bearing Restrictions Per Provider Order: No       Mobility Bed Mobility Overal bed mobility: Needs Assistance Bed Mobility: Supine to Sit     Supine to sit: Mod assist, HOB elevated, Used rails            Balance Overall balance assessment: Independent Sitting-balance support: No upper extremity supported, Feet supported Sitting balance-Leahy Scale: Good     Standing balance support: Bilateral upper extremity supported, During functional activity, Reliant on assistive device for balance Standing balance-Leahy Scale: Fair       ADL either performed  or assessed with clinical judgement   ADL Overall ADL's : Needs assistance/impaired       Grooming Details (indicate cue type and reason): declined to brush teeth reporting he completed this already this AM.                 Toilet Transfer: Minimal assistance;Ambulation;Rolling walker (2 wheels) Toilet Transfer Details (indicate cue type and reason): patient was able to particiapte in functional mobility into hallway with cues for rest breaks needed. HR up to 137bpm breifly.                  Cognition Arousal: Alert Behavior During Therapy: WFL for tasks assessed/performed Cognition: No apparent impairments                          Pertinent Vitals/ Pain       Pain Assessment Pain Assessment: 0-10 Pain Score: 5  Pain Location: left knee Pain Descriptors / Indicators: Discomfort, Grimacing Pain Intervention(s): Limited activity within patient's tolerance, Monitored during session, Patient requesting pain meds-RN notified, Repositioned         Frequency  Min 2X/week        Progress Toward Goals  OT Goals(current goals can now be found in the care plan section)  Progress towards OT goals: Progressing toward goals     Plan         AM-PAC OT 6 Clicks Daily Activity     Outcome Measure   Help from another person eating meals?: A Little Help from another person taking care of personal grooming?: A Little Help from another person toileting, which includes using toliet, bedpan, or urinal?: A Lot Help from another person bathing (including washing, rinsing, drying)?: A Lot Help from another person to  put on and taking off regular upper body clothing?: A Little Help from another person to put on and taking off regular lower body clothing?: A Lot 6 Click Score: 15    End of Session Equipment Utilized During Treatment: Gait belt;Rolling walker (2 wheels)  OT Visit Diagnosis: Unsteadiness on feet (R26.81);Other abnormalities of gait and mobility  (R26.89);Muscle weakness (generalized) (M62.81)   Activity Tolerance Patient tolerated treatment well   Patient Left in chair;with call bell/phone within reach;with chair alarm set   Nurse Communication Mobility status        Time: 0981-1914 OT Time Calculation (min): 16 min  Charges: OT General Charges $OT Visit: 1 Visit OT Treatments $Self Care/Home Management : 8-22 mins  Wynette Heckler, MS Acute Rehabilitation Department Office# [phone]   Jame Maze 07/12/2023, 12:56 PM

## 2023-07-12 NOTE — Plan of Care (Signed)
  Problem: Clinical Measurements: Goal: Diagnostic test results will improve Outcome: Progressing   Problem: Clinical Measurements: Goal: Will remain free from infection Outcome: Progressing   Problem: Clinical Measurements: Goal: Respiratory complications will improve Outcome: Progressing   Problem: Coping: Goal: Level of anxiety will decrease Outcome: Progressing

## 2023-07-13 DIAGNOSIS — M25462 Effusion, left knee: Secondary | ICD-10-CM | POA: Diagnosis not present

## 2023-07-13 DIAGNOSIS — I480 Paroxysmal atrial fibrillation: Secondary | ICD-10-CM | POA: Diagnosis not present

## 2023-07-13 DIAGNOSIS — E876 Hypokalemia: Secondary | ICD-10-CM | POA: Diagnosis not present

## 2023-07-13 DIAGNOSIS — G4733 Obstructive sleep apnea (adult) (pediatric): Secondary | ICD-10-CM | POA: Diagnosis not present

## 2023-07-13 LAB — BASIC METABOLIC PANEL WITH GFR
Anion gap: 10 (ref 5–15)
BUN: 59 mg/dL — ABNORMAL HIGH (ref 8–23)
CO2: 24 mmol/L (ref 22–32)
Calcium: 8.8 mg/dL — ABNORMAL LOW (ref 8.9–10.3)
Chloride: 106 mmol/L (ref 98–111)
Creatinine, Ser: 1.64 mg/dL — ABNORMAL HIGH (ref 0.61–1.24)
GFR, Estimated: 42 mL/min — ABNORMAL LOW (ref >60)
Glucose, Bld: 124 mg/dL — ABNORMAL HIGH (ref 70–99)
Potassium: 3.8 mmol/L (ref 3.5–5.1)
Sodium: 140 mmol/L (ref 135–145)

## 2023-07-13 MED ORDER — LACTATED RINGERS IV SOLN: INTRAVENOUS | Status: AC

## 2023-07-13 NOTE — Plan of Care (Signed)
  Problem: Clinical Measurements: Goal: Respiratory complications will improve Outcome: Progressing   Problem: Coping: Goal: Level of anxiety will decrease Outcome: Progressing   Problem: Elimination: Goal: Will not experience complications related to urinary retention Outcome: Progressing

## 2023-07-13 NOTE — Progress Notes (Signed)
 [facility]  PROGRESS NOTE  Jeffrey Carter XLK:[id number] DOB: [birth year] DOA: 07/08/2023 PCP: Genia Kettering, MD   Brief HPI:   83 year old male with history of obstructive sleep apnea on CPAP BPH hypertension obesity, prostate cancer status post radiation history of gout and paroxysmal atrial fibrillation on metoprolol  and Eliquis  at home admitted with left knee swelling and pain.  X-ray showed large left knee effusion mild diffuse subcutaneous edema severe tricompartmental osteoarthritis. ED physician drained left knee and sent for studies.    Assessment/Plan:   #1 left knee swelling and pain due to severe osteoarthritis.   x-ray showing severe osteoarthritis of the left knee. ED physician aspirated left knee fluid appears cloudy 5000 WBCs 95 neutrophils.  Gram stain moderate WBC predominantly polymorphonuclear leukocytes no organisms and no growth.  Patient remains afebrile with no leukocytosis.  Patient received Decadron  with improvement in pain though range of motion still impaired. If no improvement will get CT or MRI of the left knee. Venous Doppler no DVT Ortho consulted by ED no new recs other than outpatient follow-up PT consult; plan to go to skilled facility -Started on  Voltaren  gel 4 times daily   #2 A-fib RVR patient with history of paroxysmal A-fib  On Eliquis  and metoprolol  25 daily prior to admission Cardiology consulted signed off 07/10/2023 Increased metoprolol  200 in the morning and 50 at night, added Cardizem  300 mg daily. Normal TSH. TSH 1.37 Echo April 2025 EF 55 to 60%   #3 hypokalemia  replete    #4 history of gout  continue  allopurinol    #5 obstructive sleep apnea  on CPAP at home   #6 hyperlipidemia  on statin   #7 BPH on Flomax   #8 AKI -Slowly improving - BUN/creatinine is elevated -Likely in setting of diuretic use - Start LR at 75 mL/h - Follow renal function in a.m.    Medications     allopurinol   50 mg Oral QHS    apixaban   2.5 mg Oral BID   Chlorhexidine Gluconate Cloth  6 each Topical Daily   diclofenac  Sodium  2 g Topical QID   diltiazem   300 mg Oral Daily   loratadine   10 mg Oral QPM   losartan   100 mg Oral Daily   metoprolol  succinate  100 mg Oral q morning   metoprolol  succinate  50 mg Oral QPM   pravastatin  40 mg Oral q1800   tamsulosin   0.4 mg Oral Daily     Data Reviewed:   CBG:  No results for input(s): GLUCAP in the last 168 hours.  SpO2: 97 % O2 Flow Rate (L/min): 2 L/min FiO2 (%): 21 %    Vitals:   07/13/23 0312 07/13/23 0400 07/13/23 0503 07/13/23 0700  BP:  (!) 188/81 (!) 151/84   Pulse:  70  76  Resp:  16 15 (!) 21  Temp: (!) 97.5 F (36.4 C)     TempSrc: Axillary     SpO2:  98%  97%  Weight:      Height:          Data Reviewed:  Basic Metabolic Panel: Recent Labs  Lab 07/08/23 1953 07/09/23 0321 07/09/23 0735 07/09/23 2343 07/11/23 2224 07/12/23 1059 07/13/23 0321  NA  --  136  --  133* 137 136 140  K  --  3.0*  --  3.9 3.6 3.4* 3.8  CL  --  102  --  100 102 103 106  CO2  --  24  --  23 22 23 24   GLUCOSE  --  149*  --  147* 187* 179* 124*  BUN  --  13  --  14 53* 61* 59*  CREATININE  --  0.88  --  0.91 1.66* 1.91* 1.64*  CALCIUM  --  9.0  --  9.5 9.1 8.8* 8.8*  MG 2.0  --  2.0 2.2 2.6*  --   --   PHOS  --   --  2.8  --   --   --   --     CBC: Recent Labs  Lab 07/08/23 1229 07/09/23 0321  WBC 6.4 8.9  NEUTROABS 4.1  --   HGB 11.7* 12.4*  HCT 36.0* 37.4*  MCV 81.3 81.0  PLT 217 209    LFT No results for input(s): AST, ALT, ALKPHOS, BILITOT, PROT, ALBUMIN in the last 168 hours.   Antibiotics: Anti-infectives (From admission, onward)    None        DVT prophylaxis: Apixaban   Code Status: Full code  Family Communication: No family at bedside   CONSULTS cardiology   Subjective   Knee swelling has improved.   Objective    Physical Examination:  Appears in no acute distress S1-S2, regular, no  murmur auscultated Abdomen is soft, nontender, no organomegaly Extremities-left knee is mildly swollen, no erythema  Status is: Inpatient:          Jeffrey Carter   [facility] If 7PM-7AM, please contact night-coverage at [email or web address], Office  [phone]   07/13/2023, 7:44 AM  LOS: 4 days

## 2023-07-14 DIAGNOSIS — K219 Gastro-esophageal reflux disease without esophagitis: Secondary | ICD-10-CM | POA: Diagnosis not present

## 2023-07-14 DIAGNOSIS — Z743 Need for continuous supervision: Secondary | ICD-10-CM | POA: Diagnosis not present

## 2023-07-14 DIAGNOSIS — Z4789 Encounter for other orthopedic aftercare: Secondary | ICD-10-CM | POA: Diagnosis not present

## 2023-07-14 DIAGNOSIS — G4733 Obstructive sleep apnea (adult) (pediatric): Secondary | ICD-10-CM | POA: Diagnosis not present

## 2023-07-14 DIAGNOSIS — N179 Acute kidney failure, unspecified: Secondary | ICD-10-CM | POA: Diagnosis not present

## 2023-07-14 DIAGNOSIS — N320 Bladder-neck obstruction: Secondary | ICD-10-CM | POA: Diagnosis not present

## 2023-07-14 DIAGNOSIS — R339 Retention of urine, unspecified: Secondary | ICD-10-CM | POA: Diagnosis not present

## 2023-07-14 DIAGNOSIS — R112 Nausea with vomiting, unspecified: Secondary | ICD-10-CM | POA: Diagnosis not present

## 2023-07-14 DIAGNOSIS — R609 Edema, unspecified: Secondary | ICD-10-CM | POA: Diagnosis not present

## 2023-07-14 DIAGNOSIS — I719 Aortic aneurysm of unspecified site, without rupture: Secondary | ICD-10-CM | POA: Diagnosis not present

## 2023-07-14 DIAGNOSIS — I10 Essential (primary) hypertension: Secondary | ICD-10-CM | POA: Diagnosis not present

## 2023-07-14 DIAGNOSIS — E876 Hypokalemia: Secondary | ICD-10-CM | POA: Diagnosis not present

## 2023-07-14 DIAGNOSIS — I480 Paroxysmal atrial fibrillation: Secondary | ICD-10-CM | POA: Diagnosis not present

## 2023-07-14 DIAGNOSIS — M6281 Muscle weakness (generalized): Secondary | ICD-10-CM | POA: Diagnosis not present

## 2023-07-14 DIAGNOSIS — M1712 Unilateral primary osteoarthritis, left knee: Secondary | ICD-10-CM | POA: Diagnosis not present

## 2023-07-14 DIAGNOSIS — M25462 Effusion, left knee: Secondary | ICD-10-CM | POA: Diagnosis not present

## 2023-07-14 DIAGNOSIS — R739 Hyperglycemia, unspecified: Secondary | ICD-10-CM | POA: Diagnosis not present

## 2023-07-14 DIAGNOSIS — R269 Unspecified abnormalities of gait and mobility: Secondary | ICD-10-CM | POA: Insufficient documentation

## 2023-07-14 DIAGNOSIS — E785 Hyperlipidemia, unspecified: Secondary | ICD-10-CM | POA: Diagnosis not present

## 2023-07-14 DIAGNOSIS — K5909 Other constipation: Secondary | ICD-10-CM | POA: Diagnosis not present

## 2023-07-14 DIAGNOSIS — R2689 Other abnormalities of gait and mobility: Secondary | ICD-10-CM | POA: Diagnosis not present

## 2023-07-14 DIAGNOSIS — M6250 Muscle wasting and atrophy, not elsewhere classified, unspecified site: Secondary | ICD-10-CM | POA: Diagnosis not present

## 2023-07-14 DIAGNOSIS — D6869 Other thrombophilia: Secondary | ICD-10-CM | POA: Diagnosis not present

## 2023-07-14 DIAGNOSIS — D649 Anemia, unspecified: Secondary | ICD-10-CM | POA: Diagnosis not present

## 2023-07-14 DIAGNOSIS — M109 Gout, unspecified: Secondary | ICD-10-CM | POA: Diagnosis not present

## 2023-07-14 DIAGNOSIS — I4891 Unspecified atrial fibrillation: Secondary | ICD-10-CM | POA: Diagnosis not present

## 2023-07-14 DIAGNOSIS — M25562 Pain in left knee: Secondary | ICD-10-CM | POA: Diagnosis not present

## 2023-07-14 DIAGNOSIS — J309 Allergic rhinitis, unspecified: Secondary | ICD-10-CM | POA: Diagnosis not present

## 2023-07-14 LAB — BASIC METABOLIC PANEL WITH GFR
Anion gap: 9 (ref 5–15)
BUN: 37 mg/dL — ABNORMAL HIGH (ref 8–23)
CO2: 26 mmol/L (ref 22–32)
Calcium: 8.5 mg/dL — ABNORMAL LOW (ref 8.9–10.3)
Chloride: 106 mmol/L (ref 98–111)
Creatinine, Ser: 1.02 mg/dL (ref 0.61–1.24)
GFR, Estimated: >60 mL/min (ref >60)
Glucose, Bld: 105 mg/dL — ABNORMAL HIGH (ref 70–99)
Potassium: 3.6 mmol/L (ref 3.5–5.1)
Sodium: 141 mmol/L (ref 135–145)

## 2023-07-14 MED ORDER — APIXABAN 5 MG PO TABS
5.0000 mg | ORAL_TABLET | Freq: Two times a day (BID) | ORAL | Status: DC
  Administered 2023-07-14: 5 mg via ORAL
  Filled 2023-07-14: qty 1

## 2023-07-14 MED ORDER — DICLOFENAC SODIUM 1 % EX GEL: 4.0000 g | Freq: Four times a day (QID) | CUTANEOUS | Status: AC | PRN

## 2023-07-14 MED ORDER — CARVEDILOL 25 MG PO TABS: 25.0000 mg | ORAL_TABLET | Freq: Two times a day (BID) | ORAL | Status: AC

## 2023-07-14 MED ORDER — HYDRALAZINE HCL 25 MG PO TABS
25.0000 mg | ORAL_TABLET | Freq: Four times a day (QID) | ORAL | Status: DC | PRN
  Administered 2023-07-14: 25 mg via ORAL
  Filled 2023-07-14: qty 1

## 2023-07-14 MED ORDER — SENNOSIDES-DOCUSATE SODIUM 8.6-50 MG PO TABS: 1.0000 | ORAL_TABLET | Freq: Every evening | ORAL | Status: AC | PRN

## 2023-07-14 MED ORDER — DILTIAZEM HCL ER COATED BEADS 300 MG PO CP24
300.0000 mg | ORAL_CAPSULE | Freq: Every day | ORAL | Status: AC
Start: 2023-07-15 — End: ?

## 2023-07-14 MED ORDER — HYDROCODONE-ACETAMINOPHEN 7.5-325 MG PO TABS: 1.0000 | ORAL_TABLET | Freq: Four times a day (QID) | ORAL | 0 refills | Status: DC | PRN

## 2023-07-14 MED ORDER — MILK AND MOLASSES ENEMA
1.0000 | Freq: Once | RECTAL | Status: AC
  Administered 2023-07-14: 150 mL via RECTAL
  Filled 2023-07-14: qty 150

## 2023-07-14 NOTE — Progress Notes (Signed)
 PT Cancellation Note  Patient Details Name: Jeffrey Carter MRN: [id number] DOB: 1940-11-02   Cancelled Treatment:    Reason Eval/Treat Not Completed: Other (comment). Nursing reports currently administering an enema and pt to transition to SNF today. PT to continue to follow pt acutely if remains in hospital.  Cary Clarks, PT Acute Rehab   Annalee Kiang 07/14/2023, 2:04 PM

## 2023-07-14 NOTE — Progress Notes (Incomplete)
 [facility]  PROGRESS NOTE  Jeffrey Carter ZOX:[id number] DOB: 1941-01-06 DOA: 07/08/2023 PCP: Genia Kettering, MD   Brief HPI:   83 year old male with history of obstructive sleep apnea on CPAP BPH hypertension obesity, prostate cancer status post radiation history of gout and paroxysmal atrial fibrillation on metoprolol  and Eliquis  at home admitted with left knee swelling and pain.  X-ray showed large left knee effusion mild diffuse subcutaneous edema severe tricompartmental osteoarthritis. ED physician drained left knee and sent for studies.    Assessment/Plan:   #1 left knee swelling and pain due to severe osteoarthritis.   x-ray showing severe osteoarthritis of the left knee. ED physician aspirated left knee fluid appears cloudy 5000 WBCs 95 neutrophils.  Gram stain moderate WBC predominantly polymorphonuclear leukocytes no organisms and no growth.  Patient remains afebrile with no leukocytosis.  Patient received Decadron  with improvement in pain though range of motion still impaired. If no improvement will get CT or MRI of the left knee. Venous Doppler no DVT Ortho consulted by ED no new recs other than outpatient follow-up PT consult; plan to go to skilled facility -Started on  Voltaren  gel 4 times daily   #2 A-fib RVR patient with history of paroxysmal A-fib  On Eliquis  and metoprolol  25 daily prior to admission Cardiology consulted signed off 07/10/2023 Increased metoprolol  200 in the morning and 50 at night, added Cardizem  300 mg daily. Normal TSH. TSH 1.37 Echo April 2025 EF 55 to 60%   #3 hypokalemia  replete    #4 history of gout  continue  allopurinol    #5 obstructive sleep apnea  on CPAP at home   #6 hyperlipidemia  on statin   #7 BPH on Flomax   #8 AKI -Slowly improving - BUN/creatinine is elevated -Likely in setting of diuretic use - Start LR at 75 mL/h - Follow renal function in a.m.    Medications     allopurinol   50 mg Oral QHS    apixaban   2.5 mg Oral BID   Chlorhexidine Gluconate Cloth  6 each Topical Daily   diclofenac  Sodium  2 g Topical QID   diltiazem   300 mg Oral Daily   loratadine   10 mg Oral QPM   losartan   100 mg Oral Daily   metoprolol  succinate  100 mg Oral q morning   metoprolol  succinate  50 mg Oral QPM   pravastatin  40 mg Oral q1800   tamsulosin   0.4 mg Oral Daily     Data Reviewed:   CBG:  No results for input(s): GLUCAP in the last 168 hours.  SpO2: 97 % O2 Flow Rate (L/min): 2 L/min FiO2 (%): 21 %    Vitals:   07/14/23 0046 07/14/23 0226 07/14/23 0229 07/14/23 0315  BP:  (!) 186/84 (!) 186/84 (!) 155/85  Pulse:  62    Resp: 14     Temp:  97.7 F (36.5 C)    TempSrc:  Axillary    SpO2:  97%    Weight:      Height:          Data Reviewed:  Basic Metabolic Panel: Recent Labs  Lab 07/08/23 1953 07/09/23 0321 07/09/23 0735 07/09/23 2343 07/11/23 2224 07/12/23 1059 07/13/23 0321 07/14/23 0452  NA  --    < >  --  133* 137 136 140 141  K  --    < >  --  3.9 3.6 3.4* 3.8 3.6  CL  --    < >  --  100 102 103 106 106  CO2  --    < >  --  23 22 23 24 26   GLUCOSE  --    < >  --  147* 187* 179* 124* 105*  BUN  --    < >  --  14 53* 61* 59* 37*  CREATININE  --    < >  --  0.91 1.66* 1.91* 1.64* 1.02  CALCIUM  --    < >  --  9.5 9.1 8.8* 8.8* 8.5*  MG 2.0  --  2.0 2.2 2.6*  --   --   --   PHOS  --   --  2.8  --   --   --   --   --    < > = values in this interval not displayed.    CBC: Recent Labs  Lab 07/08/23 1229 07/09/23 0321  WBC 6.4 8.9  NEUTROABS 4.1  --   HGB 11.7* 12.4*  HCT 36.0* 37.4*  MCV 81.3 81.0  PLT 217 209    LFT No results for input(s): AST, ALT, ALKPHOS, BILITOT, PROT, ALBUMIN in the last 168 hours.   Antibiotics: Anti-infectives (From admission, onward)    None        DVT prophylaxis: Apixaban   Code Status: Full code  Family Communication: No family at bedside   CONSULTS cardiology   Subjective       Objective    Physical Examination:    Status is: Inpatient:          Jeffrey Carter   [facility] If 7PM-7AM, please contact night-coverage at [email or web address], Office  [phone]   07/14/2023, 8:12 AM  LOS: 5 days

## 2023-07-14 NOTE — Discharge Summary (Signed)
 Physician Discharge Summary   Patient: Jeffrey Carter MRN: [id number] DOB: 10-16-1940  Admit date:     07/08/2023  Discharge date: 07/14/23  Discharge Physician: [name]   PCP: Genia Kettering, MD   Recommendations at discharge:   Follow-up [facility] in 2 weeks  Discharge Diagnoses: Principal Problem:   Knee effusion, left Active Problems:   Hypokalemia   Normocytic anemia   Paroxysmal atrial fibrillation (HCC)   Essential hypertension   Gout   OSA (obstructive sleep apnea)  Resolved Problems:   * No resolved hospital problems. *  Hospital Course: 83 year old male with history of obstructive sleep apnea on CPAP BPH hypertension obesity, prostate cancer status post radiation history of gout and paroxysmal atrial fibrillation on metoprolol  and Eliquis  at home admitted with left knee swelling and pain.  X-ray showed large left knee effusion mild diffuse subcutaneous edema severe tricompartmental osteoarthritis. ED physician drained left knee and sent for studies.  Assessment and Plan:  #1 left knee swelling and pain due to severe osteoarthritis.  -Significantly improved  x-ray showing severe osteoarthritis of the left knee. ED physician aspirated left knee fluid appears cloudy 5000 WBCs 95 neutrophils.  Gram stain moderate WBC predominantly polymorphonuclear leukocytes no organisms and no growth.  Patient remains afebrile with no leukocytosis.  Patient received Decadron  with improvement in pain though range of motion still impaired. Venous Doppler no DVT Ortho consulted by ED no new recs other than outpatient follow-up PT consult; plan to go to skilled facility -Started on  Voltaren  gel 4 times daily - Seen by [facility], patient to follow-up with [facility] as outpatient   #2 A-fib RVR patient with history of paroxysmal A-fib  On Eliquis  and metoprolol  25 daily prior to admission Cardiology consulted signed off 07/10/2023 Increased metoprolol  200 in the  morning and 50 at night, added Cardizem  300 mg daily. -Blood pressure is still elevated, will discontinue metoprolol  and start Coreg 25 mg p.o. twice daily -Continue Cardizem  CD 300 mg p.o. daily Normal TSH. TSH 1.37 Echo April 2025 EF 55 to 60%   #3 hypokalemia  replete    #4 history of gout  continue  allopurinol    #5 obstructive sleep apnea  on CPAP at home   #6 hyperlipidemia  on statin   #7 BPH on Flomax    #8 AKI - Likely in setting of diuretic use -Renal function has improved - Discontinue furosemide          Consultants: Orthopedics Procedures performed: Joint aspiration, culture showed no growth Disposition: Skilled nursing facility Diet recommendation:  Discharge Diet Orders (From admission, onward)     Start     Ordered   07/14/23 0000  Diet - low sodium heart healthy        07/14/23 1246           Regular diet DISCHARGE MEDICATION: Allergies as of 07/14/2023       Reactions   Percocet [oxycodone -acetaminophen ] Nausea Only        Medication List     STOP taking these medications    acetaminophen  325 MG tablet Commonly known as: TYLENOL    amLODipine  10 MG tablet Commonly known as: NORVASC    cloNIDine  0.3 MG tablet Commonly known as: CATAPRES    furosemide  40 MG tablet Commonly known as: LASIX    metoprolol  succinate 25 MG 24 hr tablet Commonly known as: TOPROL -XL   Potassium Chloride  ER 20 MEQ Tbcr   predniSONE  10 MG tablet Commonly known as: DELTASONE    sildenafil  100 MG tablet  Commonly known as: Viagra        TAKE these medications    allopurinol  100 MG tablet Commonly known as: ZYLOPRIM  Take 0.5 tablets (50 mg total) by mouth daily. What changed: when to take this   apixaban  5 MG Tabs tablet Commonly known as: ELIQUIS  Take 1 tablet (5 mg total) by mouth 2 (two) times daily. What changed: when to take this   carvedilol 25 MG tablet Commonly known as: Coreg Take 1 tablet (25 mg total) by mouth 2 (two) times  daily.   diclofenac  Sodium 1 % Gel Commonly known as: VOLTAREN  Apply 4 g topically 4 (four) times daily as needed (knee pain). What changed:  when to take this reasons to take this   diltiazem  300 MG 24 hr capsule Commonly known as: CARDIZEM  CD Take 1 capsule (300 mg total) by mouth daily. Start taking on: July 15, 2023   diphenoxylate -atropine  2.5-0.025 MG tablet Commonly known as: Lomotil  Take 1-2 tablets by mouth 4 (four) times daily as needed for diarrhea or loose stools.   EQ Allergy Relief 10 MG tablet Generic drug: loratadine  TAKE ONE TABLET BY MOUTH ONCE DAILY What changed:  how much to take when to take this   finasteride  5 MG tablet Commonly known as: PROSCAR  Take 5 mg by mouth daily.   fluticasone  50 MCG/ACT nasal spray Commonly known as: FLONASE  Place 2 sprays into both nostrils daily. What changed:  when to take this reasons to take this   HYDROcodone -acetaminophen  7.5-325 MG tablet Commonly known as: NORCO Take 1 tablet by mouth every 6 (six) hours as needed for moderate pain (pain score 4-6).   losartan  100 MG tablet Commonly known as: COZAAR  Take 1 tablet by mouth once daily   lovastatin  40 MG tablet Commonly known as: MEVACOR  TAKE 1 TABLET BY MOUTH ONCE DAILY IN THE EVENING AT  6PM What changed:  how much to take how to take this when to take this additional instructions   methocarbamol  500 MG tablet Commonly known as: ROBAXIN  Take 1 tablet (500 mg total) by mouth 2 (two) times daily. What changed:  when to take this reasons to take this   ondansetron  4 MG disintegrating tablet Commonly known as: ZOFRAN -ODT Take 1 tablet (4 mg total) by mouth every 8 (eight) hours as needed for nausea or vomiting. What changed: reasons to take this   pantoprazole  40 MG tablet Commonly known as: Protonix  Take 1 tablet (40 mg total) by mouth daily.   senna-docusate 8.6-50 MG tablet Commonly known as: Senokot-S Take 1 tablet by mouth at bedtime as  needed for mild constipation.   tamsulosin  0.4 MG Caps capsule Commonly known as: FLOMAX  Take 1 capsule (0.4 mg total) by mouth daily.        Contact information for follow-up providers     Ali Ink, MD Follow up in 2 week(s).   Specialty: Orthopedic Surgery Contact information: [address] [place] [address] [phone]              Contact information for after-discharge care     Destination     [facility] .   Service: Skilled Nursing Contact information: [address] [place]  [address] [phone]                    Discharge Exam: Filed Weights   07/08/23 1151  Weight: 105.2 kg   General-appears in no acute distress Heart-S1-S2, regular, no murmur auscultated Lungs-clear to auscultation bilaterally,  no wheezing or crackles auscultated Abdomen-soft, nontender, no organomegaly Extremities-no edema in the lower extremities Neuro-alert, oriented x3, no focal deficit noted  Condition at discharge: good  The results of significant diagnostics from this hospitalization (including imaging, microbiology, ancillary and laboratory) are listed below for reference.   Imaging Studies: VAS US  LOWER EXTREMITY VENOUS (DVT) (ONLY [facility] & [facility]) Result Date: 07/09/2023  Lower Venous DVT Study Patient Name:  JUWAUN INSKEEP  Date of Exam:   07/08/2023 Medical Rec #: [id number]         Accession #:    [id number] Date of Birth: 1940-11-21         Patient Gender: M Patient Age:   83 years Exam Location:  [facility] Procedure:      VAS US  LOWER EXTREMITY VENOUS (DVT) Referring Phys: Sonnie Dusky --------------------------------------------------------------------------------  Indications: Swelling, and Pain of left knee. Unable to bear weight on leg. 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  Limitations: Patient unable to position  knee secondary to pain. Comparison Study: No prior study Performing Technologist: Carleene Chase RVS  Examination Guidelines: A complete evaluation includes B-mode imaging, spectral Doppler, color Doppler, and power Doppler as needed of all accessible portions of each vessel. Bilateral testing is considered an integral part of a complete examination. Limited examinations for reoccurring indications may be performed as noted. The reflux portion of the exam is performed with the patient in reverse Trendelenburg.  +-----+---------------+---------+-----------+----------+--------------+ RIGHTCompressibilityPhasicitySpontaneityPropertiesThrombus Aging +-----+---------------+---------+-----------+----------+--------------+ CFV  Full           Yes      No                                  +-----+---------------+---------+-----------+----------+--------------+ SFJ  Full                                                        +-----+---------------+---------+-----------+----------+--------------+   +---------+---------------+---------+-----------+----------+-------------------+ LEFT     CompressibilityPhasicitySpontaneityPropertiesThrombus Aging      +---------+---------------+---------+-----------+----------+-------------------+ CFV      Full           Yes      No                                       +---------+---------------+---------+-----------+----------+-------------------+ SFJ      Full                                                             +---------+---------------+---------+-----------+----------+-------------------+ FV Prox  Full           Yes      No                                       +---------+---------------+---------+-----------+----------+-------------------+ FV  Mid   Full           Yes      No                                       +---------+---------------+---------+-----------+----------+-------------------+ FV DistalFull                                                              +---------+---------------+---------+-----------+----------+-------------------+ PFV      Full           Yes      No                                       +---------+---------------+---------+-----------+----------+-------------------+ POP                     Yes      No                   patent by color and                                                       Doppler             +---------+---------------+---------+-----------+----------+-------------------+ PTV      Full                                                             +---------+---------------+---------+-----------+----------+-------------------+ PERO     Full                                                             +---------+---------------+---------+-----------+----------+-------------------+ TP trunk Full                                                             +---------+---------------+---------+-----------+----------+-------------------+     Summary: RIGHT: - No evidence of common femoral vein obstruction.   LEFT: - There is no evidence of deep vein thrombosis in the lower extremity.  - Effusion noted medial left knee  *See table(s) above for measurements and observations. Electronically signed by Genny Kid MD on 07/09/2023 at 9:08:05 AM.    Final    DG Knee Complete 4 Views Left Result Date: 07/08/2023 CLINICAL DATA:  Left knee pain and swelling, inability to bear weight, warm to touch EXAM: LEFT KNEE - COMPLETE 4+ VIEW COMPARISON:  None Available. FINDINGS: Frontal, bilateral oblique, and lateral views of the left knee are obtained. No acute fracture, subluxation, or dislocation. Severe 3 compartmental osteoarthritis greatest in the lateral compartment. Large suprapatellar joint effusion. Mild subcutaneous edema throughout the left knee. IMPRESSION: 1. Large left knee effusion. 2. Mild diffuse subcutaneous edema. 3. No acute or destructive bony  abnormalities. 4. Severe 3 compartmental osteoarthritis, greatest laterally. Electronically Signed   By: Bobbye Burrow M.D.   On: 07/08/2023 13:26    Microbiology: Results for orders placed or performed during the hospital encounter of 07/08/23  Body fluid culture w Gram Stain     Status: None   Collection Time: 07/08/23  2:20 PM   Specimen: Synovium; Body Fluid  Result Value Ref Range Status   Specimen Description   Final    SYNOVIAL Performed at [facility], [address] [place], [place] [address]    Special Requests   Final    KNEE Performed at [facility], [address] [place], [place] [address]    Gram Stain   Final    MODERATE WBC PRESENT, PREDOMINANTLY PMN NO ORGANISMS SEEN    Culture   Final    NO GROWTH 3 DAYS Performed at [facility], [address], [place], [place] [address]    Report Status 07/12/2023 FINAL  Final  MRSA Next Gen by PCR, Nasal     Status: None   Collection Time: 07/09/23  7:24 AM   Specimen: Nasal Mucosa; Nasal Swab  Result Value Ref Range Status   MRSA by PCR Next Gen NOT DETECTED NOT DETECTED Final    Comment: (NOTE) The GeneXpert MRSA Assay (FDA approved for NASAL specimens only), is one component of a comprehensive MRSA colonization surveillance program. It is not intended to diagnose MRSA infection nor to guide or monitor treatment for MRSA infections. Test performance is not FDA approved in patients less than 11 years old. Performed at [facility], [address], [place], [place] [address]     Labs: CBC: Recent Labs  Lab 07/08/23 1229 07/09/23 0321  WBC 6.4 8.9  NEUTROABS 4.1  --   HGB 11.7* 12.4*  HCT 36.0* 37.4*  MCV 81.3 81.0  PLT 217 209   Basic Metabolic Panel: Recent Labs  Lab 07/08/23 1953 07/09/23 0321 07/09/23 0735 07/09/23 2343 07/11/23 2224 07/12/23 1059 07/13/23 0321 07/14/23 0452  NA  --    < >  --  133* 137 136 140 141  K  --     < >  --  3.9 3.6 3.4* 3.8 3.6  CL  --    < >  --  100 102 103 106 106  CO2  --    < >  --  23 22 23 24 26   GLUCOSE  --    < >  --  147* 187* 179* 124* 105*  BUN  --    < >  --  14 53* 61* 59* 37*  CREATININE  --    < >  --  0.91 1.66* 1.91* 1.64* 1.02  CALCIUM  --    < >  --  9.5 9.1 8.8* 8.8* 8.5*  MG 2.0  --  2.0 2.2 2.6*  --   --   --   PHOS  --   --  2.8  --   --   --   --   --    < > = values in this  interval not displayed.   Liver Function Tests: No results for input(s): AST, ALT, ALKPHOS, BILITOT, PROT, ALBUMIN in the last 168 hours. CBG: No results for input(s): GLUCAP in the last 168 hours.  Discharge time spent: greater than 30 minutes.  Signed: [name], MD [facility] 07/14/2023

## 2023-07-14 NOTE — TOC Transition Note (Addendum)
 Transition of Care [facility]) - Discharge Note   Patient Details  Name: Jeffrey Carter MRN: [id number] Date of Birth: 01-31-1940  Transition of Care [facility]) CM/SW Contact:  Ruben Corolla, RN Phone Number: 07/14/2023, 1:05 PM   Clinical Narrative: d/c summary sent-shannon Martina [facility] rep [facility] accepted-awaiting rm#,report#- nsg to inform when ready,then will call [facility]-forms in printer.   -2:40p-[facility] called-likely p/u between 6-7p;going to rm#[id number],report#[phone]. No further CM needs.   Final next level of care: Skilled Nursing Facility Barriers to Discharge: No Barriers Identified   Patient Goals and CMS Choice            Discharge Placement              Patient chooses bed at: [facility] Patient to be transferred to facility by: [facility] Name of family member notified: Barbara(dtr) Patient and family notified of of transfer: 07/14/23  Discharge Plan and Services Additional resources added to the After Visit Summary for                                       Social Drivers of Health (SDOH) Interventions SDOH Screenings   Food Insecurity: No Food Insecurity (07/08/2023)  Housing: Low Risk  (07/08/2023)  Transportation Needs: No Transportation Needs (07/08/2023)  Utilities: Not At Risk (07/08/2023)  Alcohol Screen: Low Risk  (06/01/2023)  Depression (PHQ2-9): Low Risk  (07/04/2023)  Financial Resource Strain: Low Risk  (06/01/2023)  Physical Activity: Sufficiently Active (06/01/2023)  Social Connections: Socially Integrated (07/08/2023)  Stress: No Stress Concern Present (06/01/2023)  Tobacco Use: Medium Risk (07/08/2023)  Health Literacy: Adequate Health Literacy (06/01/2023)     Readmission Risk Interventions     No data to display

## 2023-07-14 NOTE — Care Management Important Message (Signed)
 Important Message  Patient Details IM Letter given. Name: Jeffrey Carter MRN: [id number] Date of Birth: 1940/05/18   Important Message Given:  Yes - Medicare IM     Aliha Diedrich 07/14/2023, 3:16 PM

## 2023-07-14 NOTE — TOC Progression Note (Signed)
 Transition of Care [facility]) - Progression Note    Patient Details  Name: Jeffrey Carter MRN: [id number] Date of Birth: [birth year]  [facility] CM/SW Contact  Adeyemi Hamad, Thersia Flax, RN Phone Number: 07/14/2023, 12:24 PM  Clinical Narrative: Has auth & [facility] rep [facility] has bed available. MD updated.        Barriers to Discharge: Continued Medical Work up  Expected Discharge Plan and Services                                               Social Determinants of Health (SDOH) Interventions SDOH Screenings   Food Insecurity: No Food Insecurity (07/08/2023)  Housing: Low Risk  (07/08/2023)  Transportation Needs: No Transportation Needs (07/08/2023)  Utilities: Not At Risk (07/08/2023)  Alcohol Screen: Low Risk  (06/01/2023)  Depression (PHQ2-9): Low Risk  (07/04/2023)  Financial Resource Strain: Low Risk  (06/01/2023)  Physical Activity: Sufficiently Active (06/01/2023)  Social Connections: Socially Integrated (07/08/2023)  Stress: No Stress Concern Present (06/01/2023)  Tobacco Use: Medium Risk (07/08/2023)  Health Literacy: Adequate Health Literacy (06/01/2023)    Readmission Risk Interventions     No data to display

## 2023-07-17 DIAGNOSIS — E785 Hyperlipidemia, unspecified: Secondary | ICD-10-CM | POA: Diagnosis not present

## 2023-07-17 DIAGNOSIS — K219 Gastro-esophageal reflux disease without esophagitis: Secondary | ICD-10-CM | POA: Diagnosis not present

## 2023-07-17 DIAGNOSIS — M1712 Unilateral primary osteoarthritis, left knee: Secondary | ICD-10-CM | POA: Diagnosis not present

## 2023-07-17 DIAGNOSIS — I480 Paroxysmal atrial fibrillation: Secondary | ICD-10-CM | POA: Diagnosis not present

## 2023-07-17 DIAGNOSIS — M109 Gout, unspecified: Secondary | ICD-10-CM | POA: Diagnosis not present

## 2023-07-17 DIAGNOSIS — I10 Essential (primary) hypertension: Secondary | ICD-10-CM | POA: Diagnosis not present

## 2023-07-17 DIAGNOSIS — K5909 Other constipation: Secondary | ICD-10-CM | POA: Diagnosis not present

## 2023-07-19 DIAGNOSIS — I10 Essential (primary) hypertension: Secondary | ICD-10-CM | POA: Diagnosis not present

## 2023-07-19 DIAGNOSIS — M1712 Unilateral primary osteoarthritis, left knee: Secondary | ICD-10-CM | POA: Diagnosis not present

## 2023-07-19 DIAGNOSIS — R609 Edema, unspecified: Secondary | ICD-10-CM | POA: Diagnosis not present

## 2023-07-20 DIAGNOSIS — I480 Paroxysmal atrial fibrillation: Secondary | ICD-10-CM | POA: Diagnosis not present

## 2023-07-20 DIAGNOSIS — I10 Essential (primary) hypertension: Secondary | ICD-10-CM | POA: Diagnosis not present

## 2023-07-20 DIAGNOSIS — K219 Gastro-esophageal reflux disease without esophagitis: Secondary | ICD-10-CM | POA: Diagnosis not present

## 2023-07-20 DIAGNOSIS — N179 Acute kidney failure, unspecified: Secondary | ICD-10-CM | POA: Diagnosis not present

## 2023-07-20 DIAGNOSIS — E785 Hyperlipidemia, unspecified: Secondary | ICD-10-CM | POA: Diagnosis not present

## 2023-07-20 DIAGNOSIS — N320 Bladder-neck obstruction: Secondary | ICD-10-CM | POA: Diagnosis not present

## 2023-07-20 DIAGNOSIS — R739 Hyperglycemia, unspecified: Secondary | ICD-10-CM | POA: Diagnosis not present

## 2023-07-25 DIAGNOSIS — M1712 Unilateral primary osteoarthritis, left knee: Secondary | ICD-10-CM | POA: Diagnosis not present

## 2023-07-25 DIAGNOSIS — M25562 Pain in left knee: Secondary | ICD-10-CM | POA: Diagnosis not present

## 2023-07-26 ENCOUNTER — Ambulatory Visit (HOSPITAL_BASED_OUTPATIENT_CLINIC_OR_DEPARTMENT_OTHER)

## 2023-07-26 ENCOUNTER — Other Ambulatory Visit (HOSPITAL_BASED_OUTPATIENT_CLINIC_OR_DEPARTMENT_OTHER): Payer: Self-pay | Admitting: Student

## 2023-07-26 DIAGNOSIS — M25562 Pain in left knee: Secondary | ICD-10-CM

## 2023-07-29 DIAGNOSIS — M25462 Effusion, left knee: Secondary | ICD-10-CM | POA: Diagnosis not present

## 2023-07-31 DIAGNOSIS — R609 Edema, unspecified: Secondary | ICD-10-CM | POA: Diagnosis not present

## 2023-07-31 DIAGNOSIS — I480 Paroxysmal atrial fibrillation: Secondary | ICD-10-CM | POA: Diagnosis not present

## 2023-07-31 DIAGNOSIS — Z7901 Long term (current) use of anticoagulants: Secondary | ICD-10-CM | POA: Diagnosis not present

## 2023-08-02 DIAGNOSIS — I480 Paroxysmal atrial fibrillation: Secondary | ICD-10-CM | POA: Diagnosis not present

## 2023-08-02 DIAGNOSIS — M1712 Unilateral primary osteoarthritis, left knee: Secondary | ICD-10-CM | POA: Diagnosis not present

## 2023-08-02 DIAGNOSIS — Z7901 Long term (current) use of anticoagulants: Secondary | ICD-10-CM | POA: Diagnosis not present

## 2023-08-02 DIAGNOSIS — R609 Edema, unspecified: Secondary | ICD-10-CM | POA: Diagnosis not present

## 2023-08-02 DIAGNOSIS — E785 Hyperlipidemia, unspecified: Secondary | ICD-10-CM | POA: Diagnosis not present

## 2023-08-02 DIAGNOSIS — K219 Gastro-esophageal reflux disease without esophagitis: Secondary | ICD-10-CM | POA: Diagnosis not present

## 2023-08-02 DIAGNOSIS — I10 Essential (primary) hypertension: Secondary | ICD-10-CM | POA: Diagnosis not present

## 2023-08-08 ENCOUNTER — Telehealth: Payer: Self-pay

## 2023-08-08 NOTE — Telephone Encounter (Signed)
 CMN received by [facility]. Request for face to face notes within last 12 months for proof of necessities for CPAP.     LOV on 03-08-22 by Dr Shellia. Pt has an upcoming appointment on 08-10-23 by Dr Jude. I will fax the CMN to [facility] for Dr Jude to sign.

## 2023-08-09 DIAGNOSIS — I10 Essential (primary) hypertension: Secondary | ICD-10-CM | POA: Diagnosis not present

## 2023-08-09 DIAGNOSIS — R2689 Other abnormalities of gait and mobility: Secondary | ICD-10-CM | POA: Diagnosis not present

## 2023-08-09 DIAGNOSIS — M6250 Muscle wasting and atrophy, not elsewhere classified, unspecified site: Secondary | ICD-10-CM | POA: Diagnosis not present

## 2023-08-09 DIAGNOSIS — R739 Hyperglycemia, unspecified: Secondary | ICD-10-CM | POA: Diagnosis not present

## 2023-08-09 DIAGNOSIS — I719 Aortic aneurysm of unspecified site, without rupture: Secondary | ICD-10-CM | POA: Diagnosis not present

## 2023-08-09 DIAGNOSIS — H6993 Unspecified Eustachian tube disorder, bilateral: Secondary | ICD-10-CM | POA: Diagnosis not present

## 2023-08-09 DIAGNOSIS — E876 Hypokalemia: Secondary | ICD-10-CM | POA: Diagnosis not present

## 2023-08-09 DIAGNOSIS — I480 Paroxysmal atrial fibrillation: Secondary | ICD-10-CM | POA: Diagnosis not present

## 2023-08-09 DIAGNOSIS — K5909 Other constipation: Secondary | ICD-10-CM | POA: Diagnosis not present

## 2023-08-09 DIAGNOSIS — E785 Hyperlipidemia, unspecified: Secondary | ICD-10-CM | POA: Diagnosis not present

## 2023-08-09 DIAGNOSIS — J309 Allergic rhinitis, unspecified: Secondary | ICD-10-CM | POA: Diagnosis not present

## 2023-08-09 DIAGNOSIS — N179 Acute kidney failure, unspecified: Secondary | ICD-10-CM | POA: Diagnosis not present

## 2023-08-09 DIAGNOSIS — D630 Anemia in neoplastic disease: Secondary | ICD-10-CM | POA: Diagnosis not present

## 2023-08-09 DIAGNOSIS — R338 Other retention of urine: Secondary | ICD-10-CM | POA: Diagnosis not present

## 2023-08-09 DIAGNOSIS — M1712 Unilateral primary osteoarthritis, left knee: Secondary | ICD-10-CM | POA: Diagnosis not present

## 2023-08-09 DIAGNOSIS — M109 Gout, unspecified: Secondary | ICD-10-CM | POA: Diagnosis not present

## 2023-08-09 DIAGNOSIS — N320 Bladder-neck obstruction: Secondary | ICD-10-CM | POA: Diagnosis not present

## 2023-08-09 DIAGNOSIS — D6869 Other thrombophilia: Secondary | ICD-10-CM | POA: Diagnosis not present

## 2023-08-09 DIAGNOSIS — G4733 Obstructive sleep apnea (adult) (pediatric): Secondary | ICD-10-CM | POA: Diagnosis not present

## 2023-08-09 DIAGNOSIS — K219 Gastro-esophageal reflux disease without esophagitis: Secondary | ICD-10-CM | POA: Diagnosis not present

## 2023-08-09 DIAGNOSIS — M25462 Effusion, left knee: Secondary | ICD-10-CM | POA: Diagnosis not present

## 2023-08-10 ENCOUNTER — Ambulatory Visit (HOSPITAL_BASED_OUTPATIENT_CLINIC_OR_DEPARTMENT_OTHER): Admitting: Pulmonary Disease

## 2023-08-10 ENCOUNTER — Encounter (HOSPITAL_BASED_OUTPATIENT_CLINIC_OR_DEPARTMENT_OTHER): Payer: Self-pay | Admitting: Pulmonary Disease

## 2023-08-10 VITALS — BP 171/96 | HR 65 | Ht <= 58 in | Wt 212.9 lb

## 2023-08-10 DIAGNOSIS — E876 Hypokalemia: Secondary | ICD-10-CM | POA: Diagnosis not present

## 2023-08-10 DIAGNOSIS — I10 Essential (primary) hypertension: Secondary | ICD-10-CM | POA: Diagnosis not present

## 2023-08-10 DIAGNOSIS — G4733 Obstructive sleep apnea (adult) (pediatric): Secondary | ICD-10-CM | POA: Diagnosis not present

## 2023-08-10 DIAGNOSIS — R2689 Other abnormalities of gait and mobility: Secondary | ICD-10-CM | POA: Diagnosis not present

## 2023-08-10 DIAGNOSIS — H6993 Unspecified Eustachian tube disorder, bilateral: Secondary | ICD-10-CM | POA: Diagnosis not present

## 2023-08-10 DIAGNOSIS — J309 Allergic rhinitis, unspecified: Secondary | ICD-10-CM | POA: Diagnosis not present

## 2023-08-10 DIAGNOSIS — R739 Hyperglycemia, unspecified: Secondary | ICD-10-CM | POA: Diagnosis not present

## 2023-08-10 DIAGNOSIS — I719 Aortic aneurysm of unspecified site, without rupture: Secondary | ICD-10-CM | POA: Diagnosis not present

## 2023-08-10 DIAGNOSIS — D630 Anemia in neoplastic disease: Secondary | ICD-10-CM | POA: Diagnosis not present

## 2023-08-10 DIAGNOSIS — R338 Other retention of urine: Secondary | ICD-10-CM | POA: Diagnosis not present

## 2023-08-10 DIAGNOSIS — E785 Hyperlipidemia, unspecified: Secondary | ICD-10-CM | POA: Diagnosis not present

## 2023-08-10 DIAGNOSIS — M6250 Muscle wasting and atrophy, not elsewhere classified, unspecified site: Secondary | ICD-10-CM | POA: Diagnosis not present

## 2023-08-10 DIAGNOSIS — M1712 Unilateral primary osteoarthritis, left knee: Secondary | ICD-10-CM | POA: Diagnosis not present

## 2023-08-10 DIAGNOSIS — M109 Gout, unspecified: Secondary | ICD-10-CM | POA: Diagnosis not present

## 2023-08-10 DIAGNOSIS — K5909 Other constipation: Secondary | ICD-10-CM | POA: Diagnosis not present

## 2023-08-10 DIAGNOSIS — I480 Paroxysmal atrial fibrillation: Secondary | ICD-10-CM | POA: Diagnosis not present

## 2023-08-10 DIAGNOSIS — M25462 Effusion, left knee: Secondary | ICD-10-CM | POA: Diagnosis not present

## 2023-08-10 DIAGNOSIS — N179 Acute kidney failure, unspecified: Secondary | ICD-10-CM | POA: Diagnosis not present

## 2023-08-10 DIAGNOSIS — D6869 Other thrombophilia: Secondary | ICD-10-CM | POA: Diagnosis not present

## 2023-08-10 DIAGNOSIS — N320 Bladder-neck obstruction: Secondary | ICD-10-CM | POA: Diagnosis not present

## 2023-08-10 DIAGNOSIS — K219 Gastro-esophageal reflux disease without esophagitis: Secondary | ICD-10-CM | POA: Diagnosis not present

## 2023-08-10 NOTE — Progress Notes (Signed)
   Subjective:    Patient ID: Jeffrey Carter, male    DOB: [birth year], 83 y.o.   MRN: [id number]  83 y.o. male with obstructive sleep apnea.  Pt of Dr Shellia, last seen 02/2022  -CPAP 12 cm , current since 10/2019   PMH : PAF   Discussed the use of AI scribe software for clinical note transcription with the patient, who gave verbal consent to proceed.  History of Present Illness Jeffrey Carter is an 83 year old male with obstructive sleep apnea who presents for a CPAP follow-up. He is accompanied by his son, Chyrl. He was referred by Dr. Claiborne for CPAP management.  He consistently uses the CPAP machine set at twelve centimeters of pressure and sleeps well with it. There is a leak from the sides of the mask, which covers his nose and mouth. His grand daughter assists with ordering supplies, but there are delays in receiving them. He underwent nasal surgery previously, which improved his condition. He sleeps on his side due to an inability to straighten his leg, which may contribute to the mask leakage. He does not experience excessive daytime sleepiness, although he takes a nap at the end of the day.    DL - good usage , no missed nights, on 12 cm, residual AHI 6/h, large leak  Significant tests/ events reviewed  PSG 02/29/12 >> AHI 32, SpO2 low 79%    Review of Systems  neg for any significant sore throat, dysphagia, itching, sneezing, nasal congestion or excess/ purulent secretions, fever, chills, sweats, unintended wt loss, pleuritic or exertional cp, hempoptysis, orthopnea pnd or change in chronic leg swelling. Also denies presyncope, palpitations, heartburn, abdominal pain, nausea, vomiting, diarrhea or change in bowel or urinary habits, dysuria,hematuria, rash, arthralgias, visual complaints, headache, numbness weakness or ataxia.      Objective:   Physical Exam  Gen. Pleasant, obese, in no distress, normal affect ENT - no pallor,icterus, no post nasal drip, class 2-3  airway Neck: No JVD, no thyromegaly, no carotid bruits Lungs: no use of accessory muscles, no dullness to percussion, decreased without rales or rhonchi  Cardiovascular: Rhythm regular, heart sounds  normal, no murmurs or gallops, LLE peripheral edema 2+ Abdomen: soft and non-tender, no hepatosplenomegaly, BS normal. Musculoskeletal: No deformities, no cyanosis or clubbing Neuro:  alert, non focal, no tremors       Assessment & Plan:   Assessment and Plan Assessment & Plan Obstructive Sleep Apnea Managed with CPAP therapy at 12 cm H2O. Reports good compliance but experiences mask leakage, likely due to mask age and sleeping position, causing events. - Adjust CPAP pressure range to 10-15 cm H2O. - Renew CPAP supplies prescription for one year. -  ordering new CPAP supplies from [facility].  Hypertension Blood pressure at 195/91 mmHg without associated symptoms. Management ongoing with Coreg . - Recheck blood pressure. - Continue Coreg . - Follow up with primary care physician and cardiology.  Atrial Fibrillation Managed with apixaban  for anticoagulation. - Continue apixaban .  Left Leg Swelling Swelling for one month, initially in the knee, now involving the entire leg. Negative venous duplex for DVT. Previous fluid drainage attempts unsuccessful. - Follow up with orthopedics for sonogram and potential cortisone injection.

## 2023-08-10 NOTE — Patient Instructions (Addendum)
 X Change to autosettings 10-15 cm CPAP supplies will be renewed x 1 year  X Recheck BP   VISIT SUMMARY: You came in today for a follow-up on your CPAP therapy for obstructive sleep apnea. You were accompanied by your son, Chyrl. We discussed your current CPAP usage, mask leakage issues, and your overall sleep quality. We also reviewed your blood pressure, atrial fibrillation management, and the swelling in your left leg.  YOUR PLAN: -OBSTRUCTIVE SLEEP APNEA: Obstructive sleep apnea is a condition where your breathing stops and starts repeatedly during sleep due to blocked airways. You are using a CPAP machine set at 12 cm H2O, but you are experiencing mask leakage. We will adjust your CPAP pressure range to 10-15 cm H2O and renew your CPAP supplies prescription for one year. Please ensure your daughter assists with ordering new CPAP supplies from [facility].  -HYPERTENSION: Hypertension is high blood pressure. Your blood pressure today was 195/91 mmHg. We will recheck your blood pressure and continue your current medication, Coreg . Please follow up with your primary care physician and cardiologist.  -ATRIAL FIBRILLATION: Atrial fibrillation is an irregular and often rapid heart rate that can increase your risk of strokes, heart failure, and other heart-related complications. You are currently managing this condition with apixaban , which you should continue taking as prescribed.  -LEFT LEG SWELLING: You have been experiencing swelling in your left leg for the past month. A previous test ruled out deep vein thrombosis (DVT). We recommend following up with orthopedics for a sonogram and a potential cortisone injection to address the swelling.  INSTRUCTIONS: Please follow up with your primary care physician and cardiologist for your hypertension management. Additionally, schedule an appointment with orthopedics for your left leg swelling as discussed.                      Contains  text generated by Abridge.                                 Contains text generated by Abridge.

## 2023-08-11 ENCOUNTER — Telehealth: Payer: Self-pay

## 2023-08-11 DIAGNOSIS — I10 Essential (primary) hypertension: Secondary | ICD-10-CM | POA: Diagnosis not present

## 2023-08-11 DIAGNOSIS — M109 Gout, unspecified: Secondary | ICD-10-CM | POA: Diagnosis not present

## 2023-08-11 DIAGNOSIS — N179 Acute kidney failure, unspecified: Secondary | ICD-10-CM | POA: Diagnosis not present

## 2023-08-11 DIAGNOSIS — D6869 Other thrombophilia: Secondary | ICD-10-CM | POA: Diagnosis not present

## 2023-08-11 DIAGNOSIS — N320 Bladder-neck obstruction: Secondary | ICD-10-CM | POA: Diagnosis not present

## 2023-08-11 DIAGNOSIS — D630 Anemia in neoplastic disease: Secondary | ICD-10-CM | POA: Diagnosis not present

## 2023-08-11 DIAGNOSIS — J309 Allergic rhinitis, unspecified: Secondary | ICD-10-CM | POA: Diagnosis not present

## 2023-08-11 DIAGNOSIS — I480 Paroxysmal atrial fibrillation: Secondary | ICD-10-CM | POA: Diagnosis not present

## 2023-08-11 DIAGNOSIS — E785 Hyperlipidemia, unspecified: Secondary | ICD-10-CM | POA: Diagnosis not present

## 2023-08-11 DIAGNOSIS — K5909 Other constipation: Secondary | ICD-10-CM | POA: Diagnosis not present

## 2023-08-11 DIAGNOSIS — M25562 Pain in left knee: Secondary | ICD-10-CM | POA: Diagnosis not present

## 2023-08-11 DIAGNOSIS — H6993 Unspecified Eustachian tube disorder, bilateral: Secondary | ICD-10-CM | POA: Diagnosis not present

## 2023-08-11 DIAGNOSIS — G4733 Obstructive sleep apnea (adult) (pediatric): Secondary | ICD-10-CM | POA: Diagnosis not present

## 2023-08-11 DIAGNOSIS — R338 Other retention of urine: Secondary | ICD-10-CM | POA: Diagnosis not present

## 2023-08-11 DIAGNOSIS — M25462 Effusion, left knee: Secondary | ICD-10-CM | POA: Diagnosis not present

## 2023-08-11 DIAGNOSIS — M6250 Muscle wasting and atrophy, not elsewhere classified, unspecified site: Secondary | ICD-10-CM | POA: Diagnosis not present

## 2023-08-11 DIAGNOSIS — K219 Gastro-esophageal reflux disease without esophagitis: Secondary | ICD-10-CM | POA: Diagnosis not present

## 2023-08-11 DIAGNOSIS — M1712 Unilateral primary osteoarthritis, left knee: Secondary | ICD-10-CM | POA: Diagnosis not present

## 2023-08-11 DIAGNOSIS — I719 Aortic aneurysm of unspecified site, without rupture: Secondary | ICD-10-CM | POA: Diagnosis not present

## 2023-08-11 DIAGNOSIS — E876 Hypokalemia: Secondary | ICD-10-CM | POA: Diagnosis not present

## 2023-08-11 DIAGNOSIS — R2689 Other abnormalities of gait and mobility: Secondary | ICD-10-CM | POA: Diagnosis not present

## 2023-08-11 DIAGNOSIS — R739 Hyperglycemia, unspecified: Secondary | ICD-10-CM | POA: Diagnosis not present

## 2023-08-11 NOTE — Telephone Encounter (Signed)
 Copied from CRM [id number]. Topic: Clinical - Home Health Verbal Orders >> Aug 11, 2023  3:50 PM Thersia BROCKS wrote: Caller/Agency: [facility] Number: [phone] Service Requested: Physical Therapy Frequency: 2 times a week for 4  1 times a week for 4  Any new concerns about the patient? No

## 2023-08-11 NOTE — Telephone Encounter (Signed)
 Sent back to you for Mannam to sign

## 2023-08-11 NOTE — Telephone Encounter (Signed)
 Copied from CRM [id number]. Topic: General - Other >> Aug 11, 2023  3:48 PM Thersia C wrote: Reason for CRM: Rea , Mr Jeffrey Carter starting having pain in his knee , irratied his heart has been in the ER and just now getting out of rehab , still is severe pain has nothing order and blood pressure is running high blood pressure today was 188/90.

## 2023-08-15 DIAGNOSIS — K5909 Other constipation: Secondary | ICD-10-CM | POA: Diagnosis not present

## 2023-08-15 DIAGNOSIS — I480 Paroxysmal atrial fibrillation: Secondary | ICD-10-CM | POA: Diagnosis not present

## 2023-08-15 DIAGNOSIS — D630 Anemia in neoplastic disease: Secondary | ICD-10-CM | POA: Diagnosis not present

## 2023-08-15 DIAGNOSIS — J309 Allergic rhinitis, unspecified: Secondary | ICD-10-CM | POA: Diagnosis not present

## 2023-08-15 DIAGNOSIS — E876 Hypokalemia: Secondary | ICD-10-CM | POA: Diagnosis not present

## 2023-08-15 DIAGNOSIS — I10 Essential (primary) hypertension: Secondary | ICD-10-CM | POA: Diagnosis not present

## 2023-08-15 DIAGNOSIS — M6250 Muscle wasting and atrophy, not elsewhere classified, unspecified site: Secondary | ICD-10-CM | POA: Diagnosis not present

## 2023-08-15 DIAGNOSIS — R739 Hyperglycemia, unspecified: Secondary | ICD-10-CM | POA: Diagnosis not present

## 2023-08-15 DIAGNOSIS — E785 Hyperlipidemia, unspecified: Secondary | ICD-10-CM | POA: Diagnosis not present

## 2023-08-15 DIAGNOSIS — M109 Gout, unspecified: Secondary | ICD-10-CM | POA: Diagnosis not present

## 2023-08-15 DIAGNOSIS — M25462 Effusion, left knee: Secondary | ICD-10-CM | POA: Diagnosis not present

## 2023-08-15 DIAGNOSIS — M1712 Unilateral primary osteoarthritis, left knee: Secondary | ICD-10-CM | POA: Diagnosis not present

## 2023-08-15 DIAGNOSIS — N320 Bladder-neck obstruction: Secondary | ICD-10-CM | POA: Diagnosis not present

## 2023-08-15 DIAGNOSIS — D6869 Other thrombophilia: Secondary | ICD-10-CM | POA: Diagnosis not present

## 2023-08-15 DIAGNOSIS — R338 Other retention of urine: Secondary | ICD-10-CM | POA: Diagnosis not present

## 2023-08-15 DIAGNOSIS — H6993 Unspecified Eustachian tube disorder, bilateral: Secondary | ICD-10-CM | POA: Diagnosis not present

## 2023-08-15 DIAGNOSIS — K219 Gastro-esophageal reflux disease without esophagitis: Secondary | ICD-10-CM | POA: Diagnosis not present

## 2023-08-15 DIAGNOSIS — N179 Acute kidney failure, unspecified: Secondary | ICD-10-CM | POA: Diagnosis not present

## 2023-08-15 DIAGNOSIS — R2689 Other abnormalities of gait and mobility: Secondary | ICD-10-CM | POA: Diagnosis not present

## 2023-08-15 DIAGNOSIS — I719 Aortic aneurysm of unspecified site, without rupture: Secondary | ICD-10-CM | POA: Diagnosis not present

## 2023-08-15 DIAGNOSIS — G4733 Obstructive sleep apnea (adult) (pediatric): Secondary | ICD-10-CM | POA: Diagnosis not present

## 2023-08-15 NOTE — Telephone Encounter (Signed)
 Spoke with the Pts daughter and she has stated pt is okay to wait to speak with PCP on the 24th about his legs and  knee pain

## 2023-08-15 NOTE — Telephone Encounter (Signed)
 Okay.  Thanks.

## 2023-08-15 NOTE — Telephone Encounter (Signed)
 Please schedule office visit with me or any available provider as soon as possible.  Thanks

## 2023-08-15 NOTE — Telephone Encounter (Signed)
 LDVM for Jeffrey Carter with a verbal OK for  Callback Number: [id number] Service Requested: Physical Therapy Frequency: 2 times a week for 4  1 times a week for 4

## 2023-08-17 ENCOUNTER — Ambulatory Visit: Admitting: Internal Medicine

## 2023-08-17 ENCOUNTER — Encounter: Payer: Self-pay | Admitting: Internal Medicine

## 2023-08-17 VITALS — BP 160/80 | HR 76 | Temp 97.9°F | Ht <= 58 in | Wt 212.0 lb

## 2023-08-17 DIAGNOSIS — M6250 Muscle wasting and atrophy, not elsewhere classified, unspecified site: Secondary | ICD-10-CM | POA: Diagnosis not present

## 2023-08-17 DIAGNOSIS — K219 Gastro-esophageal reflux disease without esophagitis: Secondary | ICD-10-CM | POA: Diagnosis not present

## 2023-08-17 DIAGNOSIS — R634 Abnormal weight loss: Secondary | ICD-10-CM | POA: Diagnosis not present

## 2023-08-17 DIAGNOSIS — E876 Hypokalemia: Secondary | ICD-10-CM | POA: Diagnosis not present

## 2023-08-17 DIAGNOSIS — I10 Essential (primary) hypertension: Secondary | ICD-10-CM | POA: Diagnosis not present

## 2023-08-17 DIAGNOSIS — R739 Hyperglycemia, unspecified: Secondary | ICD-10-CM | POA: Diagnosis not present

## 2023-08-17 DIAGNOSIS — N320 Bladder-neck obstruction: Secondary | ICD-10-CM | POA: Diagnosis not present

## 2023-08-17 DIAGNOSIS — K5909 Other constipation: Secondary | ICD-10-CM | POA: Diagnosis not present

## 2023-08-17 DIAGNOSIS — J309 Allergic rhinitis, unspecified: Secondary | ICD-10-CM | POA: Diagnosis not present

## 2023-08-17 DIAGNOSIS — H6993 Unspecified Eustachian tube disorder, bilateral: Secondary | ICD-10-CM | POA: Diagnosis not present

## 2023-08-17 DIAGNOSIS — E785 Hyperlipidemia, unspecified: Secondary | ICD-10-CM | POA: Diagnosis not present

## 2023-08-17 DIAGNOSIS — R338 Other retention of urine: Secondary | ICD-10-CM | POA: Diagnosis not present

## 2023-08-17 DIAGNOSIS — M1712 Unilateral primary osteoarthritis, left knee: Secondary | ICD-10-CM | POA: Diagnosis not present

## 2023-08-17 DIAGNOSIS — D630 Anemia in neoplastic disease: Secondary | ICD-10-CM | POA: Diagnosis not present

## 2023-08-17 DIAGNOSIS — I719 Aortic aneurysm of unspecified site, without rupture: Secondary | ICD-10-CM | POA: Diagnosis not present

## 2023-08-17 DIAGNOSIS — R2689 Other abnormalities of gait and mobility: Secondary | ICD-10-CM | POA: Diagnosis not present

## 2023-08-17 DIAGNOSIS — I480 Paroxysmal atrial fibrillation: Secondary | ICD-10-CM

## 2023-08-17 DIAGNOSIS — M1A0620 Idiopathic chronic gout, left knee, without tophus (tophi): Secondary | ICD-10-CM | POA: Diagnosis not present

## 2023-08-17 DIAGNOSIS — D6869 Other thrombophilia: Secondary | ICD-10-CM | POA: Diagnosis not present

## 2023-08-17 DIAGNOSIS — N179 Acute kidney failure, unspecified: Secondary | ICD-10-CM | POA: Diagnosis not present

## 2023-08-17 DIAGNOSIS — M109 Gout, unspecified: Secondary | ICD-10-CM | POA: Diagnosis not present

## 2023-08-17 DIAGNOSIS — M25462 Effusion, left knee: Secondary | ICD-10-CM | POA: Diagnosis not present

## 2023-08-17 DIAGNOSIS — G4733 Obstructive sleep apnea (adult) (pediatric): Secondary | ICD-10-CM | POA: Diagnosis not present

## 2023-08-17 LAB — COMPREHENSIVE METABOLIC PANEL WITH GFR
ALT: 19 U/L (ref 0–53)
AST: 13 U/L (ref 0–37)
Albumin: 3.7 g/dL (ref 3.5–5.2)
Alkaline Phosphatase: 91 U/L (ref 39–117)
BUN: 21 mg/dL (ref 6–23)
CO2: 32 meq/L (ref 19–32)
Calcium: 9.2 mg/dL (ref 8.4–10.5)
Chloride: 101 meq/L (ref 96–112)
Creatinine, Ser: 1.03 mg/dL (ref 0.40–1.50)
GFR: 68.24 mL/min (ref >60.00)
Glucose, Bld: 104 mg/dL — ABNORMAL HIGH (ref 70–99)
Potassium: 3.3 meq/L — ABNORMAL LOW (ref 3.5–5.1)
Sodium: 140 meq/L (ref 135–145)
Total Bilirubin: 0.3 mg/dL (ref 0.2–1.2)
Total Protein: 7.1 g/dL (ref 6.0–8.3)

## 2023-08-17 LAB — CBC WITH DIFFERENTIAL/PLATELET
Basophils Absolute: 0 K/uL (ref 0.0–0.1)
Basophils Relative: 0.3 % (ref 0.0–3.0)
Eosinophils Absolute: 0.1 K/uL (ref 0.0–0.7)
Eosinophils Relative: 1.7 % (ref 0.0–5.0)
HCT: 34.4 % — ABNORMAL LOW (ref 39.0–52.0)
Hemoglobin: 11.4 g/dL — ABNORMAL LOW (ref 13.0–17.0)
Lymphocytes Relative: 31 % (ref 12.0–46.0)
Lymphs Abs: 1.8 K/uL (ref 0.7–4.0)
MCHC: 33 g/dL (ref 30.0–36.0)
MCV: 79.1 fl (ref 78.0–100.0)
Monocytes Absolute: 0.6 K/uL (ref 0.1–1.0)
Monocytes Relative: 10.4 % (ref 3.0–12.0)
Neutro Abs: 3.3 K/uL (ref 1.4–7.7)
Neutrophils Relative %: 56.6 % (ref 43.0–77.0)
Platelets: 302 K/uL (ref 150.0–400.0)
RBC: 4.35 Mil/uL (ref 4.22–5.81)
RDW: 15.1 % (ref 11.5–15.5)
WBC: 5.9 K/uL (ref 4.0–10.5)

## 2023-08-17 LAB — TSH: TSH: 2.38 u[IU]/mL (ref 0.35–5.50)

## 2023-08-17 LAB — URIC ACID: Uric Acid, Serum: 5.6 mg/dL (ref 4.0–7.8)

## 2023-08-17 MED ORDER — HYDROCODONE-ACETAMINOPHEN 7.5-325 MG PO TABS
1.0000 | ORAL_TABLET | Freq: Two times a day (BID) | ORAL | 0 refills | Status: AC | PRN
Start: 2023-08-17 — End: ?

## 2023-08-17 MED ORDER — ALLOPURINOL 100 MG PO TABS: 50.0000 mg | ORAL_TABLET | Freq: Every day | ORAL | 3 refills | Status: AC

## 2023-08-17 MED ORDER — CLONIDINE HCL 0.3 MG PO TABS: 0.3000 mg | ORAL_TABLET | Freq: Two times a day (BID) | ORAL | 3 refills | Status: DC

## 2023-08-17 MED ORDER — METHYLPREDNISOLONE 4 MG PO TBPK
ORAL_TABLET | ORAL | 0 refills | Status: DC
Start: 2023-08-17 — End: 2023-11-30

## 2023-08-17 MED ORDER — APIXABAN 5 MG PO TABS: 5.0000 mg | ORAL_TABLET | Freq: Two times a day (BID) | ORAL | 4 refills | Status: AC

## 2023-08-17 NOTE — Assessment & Plan Note (Signed)
 L knee, foot Medrol  pack Re-start Allopurinol 

## 2023-08-17 NOTE — Assessment & Plan Note (Signed)
 On Flomax 

## 2023-08-17 NOTE — Progress Notes (Signed)
 Subjective:  Patient ID: Jeffrey Carter, male    DOB: 08/02/1940  Age: 83 y.o. MRN: [id number]  CC: Follow-up (Bp has been high and the knee is still swollen on the left side this has been going on for about a month. Pt  has had headaches chronically before but not recently pt rates his pain a 6 to 7 out of 10 he would like something for the pain also would like to discuss medication in regards to some that the er made him stop taking but have concerns if he needs to start back taking them )   HPI Jeffrey Carter presents for HTN, L knee pain, A fib. BP has been high and the knee is still swollen on the left side this has been going on for about a month. Pt  has had headaches chronically before but not recently pt rates his pain a 6 to 7 out of 10 he would like something for the pain also would like to discuss medication in regards to some that the er made him stop taking but have concerns if he needs to start back taking them.  Here w/dtr and son   Per d/c:  Admit date:     07/08/2023  Discharge date: 07/14/23  Discharge Physician: Sabas GORMAN Brod    PCP: Garald Karlynn GAILS, MD    Recommendations at discharge:    Follow-up [facility] in 2 weeks   Discharge Diagnoses: Principal Problem:   Knee effusion, left Active Problems:   Hypokalemia   Normocytic anemia   Paroxysmal atrial fibrillation (HCC)   Essential hypertension   Gout   OSA (obstructive sleep apnea)   Resolved Problems:   * No resolved hospital problems. *   Hospital Course: 83 year old male with history of obstructive sleep apnea on CPAP BPH hypertension obesity, prostate cancer status post radiation history of gout and paroxysmal atrial fibrillation on metoprolol  and Eliquis  at home admitted with left knee swelling and pain.  X-ray showed large left knee effusion mild diffuse subcutaneous edema severe tricompartmental osteoarthritis. ED physician drained left knee and sent for studies.   Assessment and  Plan:   #1 left knee swelling and pain due to severe osteoarthritis.  -Significantly improved  x-ray showing severe osteoarthritis of the left knee. ED physician aspirated left knee fluid appears cloudy 5000 WBCs 95 neutrophils.  Gram stain moderate WBC predominantly polymorphonuclear leukocytes no organisms and no growth.  Patient remains afebrile with no leukocytosis.  Patient received Decadron  with improvement in pain though range of motion still impaired. Venous Doppler no DVT Ortho consulted by ED no new recs other than outpatient follow-up PT consult; plan to go to skilled facility -Started on  Voltaren  gel 4 times daily - Seen by Dareen, patient to follow-up with [facility] as outpatient   #2 A-fib RVR patient with history of paroxysmal A-fib  On Eliquis  and metoprolol  25 daily prior to admission Cardiology consulted signed off 07/10/2023 Increased metoprolol  200 in the morning and 50 at night, added Cardizem  300 mg daily. -Blood pressure is still elevated, will discontinue metoprolol  and start Coreg  25 mg p.o. twice daily -Continue Cardizem  CD 300 mg p.o. daily Normal TSH. TSH 1.37 Echo April 2025 EF 55 to 60%   #3 hypokalemia  replete    #4 history of gout  continue  allopurinol    #5 obstructive sleep apnea  on CPAP at home   #6 hyperlipidemia  on statin   #7 BPH on Flomax    #8 AKI -  Likely in setting of diuretic use -Renal function has improved - Discontinue furosemide          Outpatient Medications Prior to Visit  Medication Sig Dispense Refill   carvedilol  (COREG ) 25 MG tablet Take 1 tablet (25 mg total) by mouth 2 (two) times daily.     diclofenac  Sodium (VOLTAREN ) 1 % GEL Apply 4 g topically 4 (four) times daily as needed (knee pain).     diltiazem  (CARDIZEM  CD) 300 MG 24 hr capsule Take 1 capsule (300 mg total) by mouth daily.     diphenoxylate -atropine  (LOMOTIL ) 2.5-0.025 MG tablet Take 1-2 tablets by mouth 4 (four) times daily as needed for  diarrhea or loose stools. 60 tablet 0   EQ ALLERGY RELIEF 10 MG tablet TAKE ONE TABLET BY MOUTH ONCE DAILY (Patient taking differently: Take 10 mg by mouth every evening.) 90 tablet 3   finasteride  (PROSCAR ) 5 MG tablet Take 5 mg by mouth daily.     fluticasone  (FLONASE ) 50 MCG/ACT nasal spray Place 2 sprays into both nostrils daily. (Patient taking differently: Place 2 sprays into both nostrils daily as needed for allergies or rhinitis.) 16 g 2   losartan  (COZAAR ) 100 MG tablet Take 1 tablet by mouth once daily 90 tablet 3   lovastatin  (MEVACOR ) 40 MG tablet TAKE 1 TABLET BY MOUTH ONCE DAILY IN THE EVENING AT  6PM (Patient taking differently: Take 40 mg by mouth daily at 6 PM.) 90 tablet 2   methocarbamol  (ROBAXIN ) 500 MG tablet Take 1 tablet (500 mg total) by mouth 2 (two) times daily. (Patient taking differently: Take 500 mg by mouth 2 (two) times daily as needed for muscle spasms.) 10 tablet 0   ondansetron  (ZOFRAN -ODT) 4 MG disintegrating tablet Take 1 tablet (4 mg total) by mouth every 8 (eight) hours as needed for nausea or vomiting. (Patient taking differently: Take 4 mg by mouth every 8 (eight) hours as needed for nausea or vomiting (DISSOLVE ORALLY).) 20 tablet 0   pantoprazole  (PROTONIX ) 40 MG tablet Take 1 tablet (40 mg total) by mouth daily. 30 tablet 1   senna-docusate (SENOKOT-S) 8.6-50 MG tablet Take 1 tablet by mouth at bedtime as needed for mild constipation.     tamsulosin  (FLOMAX ) 0.4 MG CAPS capsule Take 1 capsule (0.4 mg total) by mouth daily. 90 capsule 1   allopurinol  (ZYLOPRIM ) 100 MG tablet Take 0.5 tablets (50 mg total) by mouth daily. (Patient taking differently: Take 50 mg by mouth at bedtime.) 45 tablet 3   apixaban  (ELIQUIS ) 5 MG TABS tablet Take 1 tablet (5 mg total) by mouth 2 (two) times daily. (Patient taking differently: Take 5 mg by mouth every evening.) 60 tablet 4   HYDROcodone -acetaminophen  (NORCO) 7.5-325 MG tablet Take 1 tablet by mouth every 6 (six) hours as  needed for moderate pain (pain score 4-6). 10 tablet 0   No facility-administered medications prior to visit.    ROS: Review of Systems  Constitutional:  Positive for unexpected weight change. Negative for appetite change and fatigue.  HENT:  Negative for congestion, nosebleeds, sneezing, sore throat and trouble swallowing.   Eyes:  Negative for itching and visual disturbance.  Respiratory:  Negative for cough.   Cardiovascular:  Negative for chest pain, palpitations and leg swelling.  Gastrointestinal:  Negative for abdominal distention, blood in stool, diarrhea and nausea.  Genitourinary:  Negative for frequency and hematuria.  Musculoskeletal:  Positive for arthralgias and gait problem. Negative for back pain, joint swelling and neck pain.  Skin:  Negative for rash.  Neurological:  Negative for dizziness, tremors, speech difficulty and weakness.  Hematological:  Bruises/bleeds easily.  Psychiatric/Behavioral:  Positive for decreased concentration. Negative for agitation, dysphoric mood, sleep disturbance and suicidal ideas. The patient is not nervous/anxious.     Objective:  BP (!) 160/80 (BP Location: Right Arm, Patient Position: Sitting)   Pulse 76   Temp 97.9 F (36.6 C) (Oral)   Ht 1' (0.305 m)   Wt 212 lb (96.2 kg)   SpO2 98%   BMI 1035.09 kg/m   BP Readings from Last 3 Encounters:  08/17/23 (!) 160/80  08/10/23 (!) 171/96  07/14/23 (!) 149/64    Wt Readings from Last 3 Encounters:  08/17/23 212 lb (96.2 kg)  08/10/23 212 lb 14.4 oz (96.6 kg)  07/08/23 232 lb (105.2 kg)    Physical Exam Constitutional:      General: He is not in acute distress.    Appearance: He is well-developed. He is obese.     Comments: NAD  Eyes:     Conjunctiva/sclera: Conjunctivae normal.     Pupils: Pupils are equal, round, and reactive to light.  Neck:     Thyroid : No thyromegaly.     Vascular: No JVD.  Cardiovascular:     Rate and Rhythm: Normal rate and regular rhythm.      Heart sounds: Normal heart sounds. No murmur heard.    No friction rub. No gallop.  Pulmonary:     Effort: Pulmonary effort is normal. No respiratory distress.     Breath sounds: Normal breath sounds. No wheezing or rales.  Chest:     Chest wall: No tenderness.  Abdominal:     General: Bowel sounds are normal. There is no distension.     Palpations: Abdomen is soft. There is no mass.     Tenderness: There is no abdominal tenderness. There is no guarding or rebound.  Musculoskeletal:        General: Tenderness present. Normal range of motion.     Cervical back: Normal range of motion.     Right lower leg: No edema.     Left lower leg: No edema.  Lymphadenopathy:     Cervical: No cervical adenopathy.  Skin:    General: Skin is warm and dry.     Findings: No rash.  Neurological:     Mental Status: He is alert and oriented to person, place, and time. Mental status is at baseline.     Cranial Nerves: No cranial nerve deficit.     Motor: No abnormal muscle tone.     Coordination: Coordination normal.     Gait: Gait abnormal.     Deep Tendon Reflexes: Reflexes are normal and symmetric.  Psychiatric:        Behavior: Behavior normal.        Thought Content: Thought content normal.        Judgment: Judgment normal.    L knee is swollen, tender Walker   Lab Results  Component Value Date   WBC 8.9 07/09/2023   HGB 12.4 (L) 07/09/2023   HCT 37.4 (L) 07/09/2023   PLT 209 07/09/2023   GLUCOSE 105 (H) 07/14/2023   CHOL 131 10/15/2019   TRIG 67 10/15/2019   HDL 43 10/15/2019   LDLCALC 74 10/15/2019   ALT 7 04/03/2023   AST 10 04/03/2023   NA 141 07/14/2023   K 3.6 07/14/2023   CL 106 07/14/2023   CREATININE 1.02 07/14/2023   BUN 37 (  H) 07/14/2023   CO2 26 07/14/2023   TSH 1.376 07/09/2023   PSA 0.17 04/03/2023   INR 1.01 10/12/2014   HGBA1C 6.2 06/14/2021    VAS US  LOWER EXTREMITY VENOUS (DVT) (ONLY [facility] & [facility]) Result Date: 07/09/2023  Lower Venous DVT Study Patient Name:   RUBEL HECKARD  Date of Exam:   07/08/2023 Medical Rec #: [id number]         Accession #:    [id number] Date of Birth: May 15, 1940         Patient Gender: M Patient Age:   54 years Exam Location:  [facility] Procedure:      VAS US  LOWER EXTREMITY VENOUS (DVT) Referring Phys: DARRYLE BIRMINGHAM --------------------------------------------------------------------------------  Indications: Swelling, and Pain of left knee. Unable to bear weight on leg. 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  Limitations: Patient unable to position knee secondary to pain. Comparison Study: No prior study Performing Technologist: Alberta Lis RVS  Examination Guidelines: A complete evaluation includes B-mode imaging, spectral Doppler, color Doppler, and power Doppler as needed of all accessible portions of each vessel. Bilateral testing is considered an integral part of a complete examination. Limited examinations for reoccurring indications may be performed as noted. The reflux portion of the exam is performed with the patient in reverse Trendelenburg.  +-----+---------------+---------+-----------+----------+--------------+ RIGHTCompressibilityPhasicitySpontaneityPropertiesThrombus Aging +-----+---------------+---------+-----------+----------+--------------+ CFV  Full           Yes      No                                  +-----+---------------+---------+-----------+----------+--------------+ SFJ  Full                                                        +-----+---------------+---------+-----------+----------+--------------+   +---------+---------------+---------+-----------+----------+-------------------+ LEFT     CompressibilityPhasicitySpontaneityPropertiesThrombus Aging      +---------+---------------+---------+-----------+----------+-------------------+ CFV      Full           Yes      No                                        +---------+---------------+---------+-----------+----------+-------------------+ SFJ      Full                                                             +---------+---------------+---------+-----------+----------+-------------------+ FV Prox  Full           Yes      No                                       +---------+---------------+---------+-----------+----------+-------------------+ FV Mid   Full           Yes  No                                       +---------+---------------+---------+-----------+----------+-------------------+ FV DistalFull                                                             +---------+---------------+---------+-----------+----------+-------------------+ PFV      Full           Yes      No                                       +---------+---------------+---------+-----------+----------+-------------------+ POP                     Yes      No                   patent by color and                                                       Doppler             +---------+---------------+---------+-----------+----------+-------------------+ PTV      Full                                                             +---------+---------------+---------+-----------+----------+-------------------+ PERO     Full                                                             +---------+---------------+---------+-----------+----------+-------------------+ TP trunk Full                                                             +---------+---------------+---------+-----------+----------+-------------------+     Summary: RIGHT: - No evidence of common femoral vein obstruction.   LEFT: - There is no evidence of deep vein thrombosis in the lower extremity.  - Effusion noted medial left knee  *See table(s) above for measurements and observations. Electronically signed by Gaile New MD on 07/09/2023 at 9:08:05 AM.     Final    DG Knee Complete 4 Views Left Result Date: 07/08/2023 CLINICAL DATA:  Left knee pain and swelling, inability to bear weight, warm to touch EXAM: LEFT KNEE - COMPLETE 4+ VIEW COMPARISON:  None Available. FINDINGS: Frontal, bilateral oblique, and lateral views of the left knee are obtained. No acute fracture, subluxation, or  dislocation. Severe 3 compartmental osteoarthritis greatest in the lateral compartment. Large suprapatellar joint effusion. Mild subcutaneous edema throughout the left knee. IMPRESSION: 1. Large left knee effusion. 2. Mild diffuse subcutaneous edema. 3. No acute or destructive bony abnormalities. 4. Severe 3 compartmental osteoarthritis, greatest laterally. Electronically Signed   By: Ozell Daring M.D.   On: 07/08/2023 13:26    Assessment & Plan:   Problem List Items Addressed This Visit     Bladder neck obstruction    On Flomax       Essential hypertension   Worse Restart Clonidine       Relevant Medications   cloNIDine  (CATAPRES ) 0.3 MG tablet   apixaban  (ELIQUIS ) 5 MG TABS tablet   Other Relevant Orders   CBC with Differential/Platelet   Comprehensive metabolic panel with GFR   Uric acid   TSH   Gout - Primary   L knee, foot Medrol  pack Re-start Allopurinol       Relevant Orders   Uric acid   Hyperglycemia   Check CMET Pt lost wt      Paroxysmal atrial fibrillation (HCC)   In NSR now We re-started Eliquis       Relevant Medications   cloNIDine  (CATAPRES ) 0.3 MG tablet   apixaban  (ELIQUIS ) 5 MG TABS tablet   Other Relevant Orders   CBC with Differential/Platelet   Comprehensive metabolic panel with GFR   Uric acid   TSH   Weight loss   Diet has changed - they were eating at the [employer] daily - not now      Relevant Orders   CBC with Differential/Platelet   Comprehensive metabolic panel with GFR   Uric acid   TSH      Meds ordered this encounter  Medications   cloNIDine  (CATAPRES ) 0.3 MG tablet    Sig: Take 1 tablet  (0.3 mg total) by mouth 2 (two) times daily.    Dispense:  180 tablet    Refill:  3   allopurinol  (ZYLOPRIM ) 100 MG tablet    Sig: Take 0.5 tablets (50 mg total) by mouth daily.    Dispense:  45 tablet    Refill:  3   apixaban  (ELIQUIS ) 5 MG TABS tablet    Sig: Take 1 tablet (5 mg total) by mouth 2 (two) times daily.    Dispense:  60 tablet    Refill:  4   HYDROcodone -acetaminophen  (NORCO) 7.5-325 MG tablet    Sig: Take 1 tablet by mouth 2 (two) times daily as needed for moderate pain (pain score 4-6).    Dispense:  60 tablet    Refill:  0   methylPREDNISolone  (MEDROL  DOSEPAK) 4 MG TBPK tablet    Sig: As directed    Dispense:  21 tablet    Refill:  0      Follow-up: No follow-ups on file.  Marolyn Noel, MD

## 2023-08-17 NOTE — Assessment & Plan Note (Signed)
 In NSR now We re-started Eliquis 

## 2023-08-17 NOTE — Assessment & Plan Note (Signed)
 Diet has changed - they were eating at the [employer] daily - not now

## 2023-08-17 NOTE — Assessment & Plan Note (Signed)
 Check CMET Pt lost wt

## 2023-08-17 NOTE — Assessment & Plan Note (Signed)
 Worse Restart Clonidine 

## 2023-08-20 ENCOUNTER — Ambulatory Visit: Payer: Self-pay | Admitting: Internal Medicine

## 2023-08-20 MED ORDER — POTASSIUM CHLORIDE CRYS ER 10 MEQ PO TBCR
10.0000 meq | EXTENDED_RELEASE_TABLET | Freq: Two times a day (BID) | ORAL | 0 refills | Status: DC
Start: 2023-08-20 — End: 2023-10-12

## 2023-08-22 DIAGNOSIS — E785 Hyperlipidemia, unspecified: Secondary | ICD-10-CM | POA: Diagnosis not present

## 2023-08-22 DIAGNOSIS — I480 Paroxysmal atrial fibrillation: Secondary | ICD-10-CM | POA: Diagnosis not present

## 2023-08-22 DIAGNOSIS — G4733 Obstructive sleep apnea (adult) (pediatric): Secondary | ICD-10-CM | POA: Diagnosis not present

## 2023-08-22 DIAGNOSIS — M1712 Unilateral primary osteoarthritis, left knee: Secondary | ICD-10-CM | POA: Diagnosis not present

## 2023-08-22 DIAGNOSIS — K5909 Other constipation: Secondary | ICD-10-CM | POA: Diagnosis not present

## 2023-08-22 DIAGNOSIS — M25462 Effusion, left knee: Secondary | ICD-10-CM | POA: Diagnosis not present

## 2023-08-22 DIAGNOSIS — D630 Anemia in neoplastic disease: Secondary | ICD-10-CM | POA: Diagnosis not present

## 2023-08-22 DIAGNOSIS — D6869 Other thrombophilia: Secondary | ICD-10-CM | POA: Diagnosis not present

## 2023-08-22 DIAGNOSIS — K219 Gastro-esophageal reflux disease without esophagitis: Secondary | ICD-10-CM | POA: Diagnosis not present

## 2023-08-22 DIAGNOSIS — M109 Gout, unspecified: Secondary | ICD-10-CM | POA: Diagnosis not present

## 2023-08-22 DIAGNOSIS — R2689 Other abnormalities of gait and mobility: Secondary | ICD-10-CM | POA: Diagnosis not present

## 2023-08-22 DIAGNOSIS — I719 Aortic aneurysm of unspecified site, without rupture: Secondary | ICD-10-CM | POA: Diagnosis not present

## 2023-08-22 DIAGNOSIS — N179 Acute kidney failure, unspecified: Secondary | ICD-10-CM | POA: Diagnosis not present

## 2023-08-22 DIAGNOSIS — R739 Hyperglycemia, unspecified: Secondary | ICD-10-CM | POA: Diagnosis not present

## 2023-08-22 DIAGNOSIS — M6250 Muscle wasting and atrophy, not elsewhere classified, unspecified site: Secondary | ICD-10-CM | POA: Diagnosis not present

## 2023-08-22 DIAGNOSIS — N320 Bladder-neck obstruction: Secondary | ICD-10-CM | POA: Diagnosis not present

## 2023-08-22 DIAGNOSIS — H6993 Unspecified Eustachian tube disorder, bilateral: Secondary | ICD-10-CM | POA: Diagnosis not present

## 2023-08-22 DIAGNOSIS — E876 Hypokalemia: Secondary | ICD-10-CM | POA: Diagnosis not present

## 2023-08-22 DIAGNOSIS — J309 Allergic rhinitis, unspecified: Secondary | ICD-10-CM | POA: Diagnosis not present

## 2023-08-22 DIAGNOSIS — R338 Other retention of urine: Secondary | ICD-10-CM | POA: Diagnosis not present

## 2023-08-22 DIAGNOSIS — I10 Essential (primary) hypertension: Secondary | ICD-10-CM | POA: Diagnosis not present

## 2023-08-22 NOTE — Telephone Encounter (Signed)
 Was this ever received back from [facility]?

## 2023-08-22 NOTE — Telephone Encounter (Signed)
 New order sent to [facility] on 08/10/2023 including clinical notes. Sent email to [name] with [facility] to see if they had everything they need to process the order.

## 2023-08-22 NOTE — Telephone Encounter (Signed)
 Per [name] with [facility], It looks like we received the SWO that we were missing today, so nothing else is needed for this patient.

## 2023-08-23 ENCOUNTER — Telehealth (HOSPITAL_BASED_OUTPATIENT_CLINIC_OR_DEPARTMENT_OTHER): Payer: Self-pay

## 2023-08-23 NOTE — Telephone Encounter (Signed)
 CMN received for CPAP supplies signed by provider and faxed confirmation received

## 2023-08-29 ENCOUNTER — Telehealth: Payer: Self-pay | Admitting: Internal Medicine

## 2023-08-29 DIAGNOSIS — D6869 Other thrombophilia: Secondary | ICD-10-CM | POA: Diagnosis not present

## 2023-08-29 DIAGNOSIS — N179 Acute kidney failure, unspecified: Secondary | ICD-10-CM | POA: Diagnosis not present

## 2023-08-29 DIAGNOSIS — N320 Bladder-neck obstruction: Secondary | ICD-10-CM | POA: Diagnosis not present

## 2023-08-29 DIAGNOSIS — E785 Hyperlipidemia, unspecified: Secondary | ICD-10-CM | POA: Diagnosis not present

## 2023-08-29 DIAGNOSIS — K219 Gastro-esophageal reflux disease without esophagitis: Secondary | ICD-10-CM | POA: Diagnosis not present

## 2023-08-29 DIAGNOSIS — H6993 Unspecified Eustachian tube disorder, bilateral: Secondary | ICD-10-CM | POA: Diagnosis not present

## 2023-08-29 DIAGNOSIS — I719 Aortic aneurysm of unspecified site, without rupture: Secondary | ICD-10-CM | POA: Diagnosis not present

## 2023-08-29 DIAGNOSIS — E876 Hypokalemia: Secondary | ICD-10-CM | POA: Diagnosis not present

## 2023-08-29 DIAGNOSIS — R739 Hyperglycemia, unspecified: Secondary | ICD-10-CM | POA: Diagnosis not present

## 2023-08-29 DIAGNOSIS — I10 Essential (primary) hypertension: Secondary | ICD-10-CM | POA: Diagnosis not present

## 2023-08-29 DIAGNOSIS — G4733 Obstructive sleep apnea (adult) (pediatric): Secondary | ICD-10-CM | POA: Diagnosis not present

## 2023-08-29 DIAGNOSIS — K5909 Other constipation: Secondary | ICD-10-CM | POA: Diagnosis not present

## 2023-08-29 DIAGNOSIS — R2689 Other abnormalities of gait and mobility: Secondary | ICD-10-CM | POA: Diagnosis not present

## 2023-08-29 DIAGNOSIS — I480 Paroxysmal atrial fibrillation: Secondary | ICD-10-CM | POA: Diagnosis not present

## 2023-08-29 DIAGNOSIS — R338 Other retention of urine: Secondary | ICD-10-CM | POA: Diagnosis not present

## 2023-08-29 DIAGNOSIS — D630 Anemia in neoplastic disease: Secondary | ICD-10-CM | POA: Diagnosis not present

## 2023-08-29 DIAGNOSIS — J309 Allergic rhinitis, unspecified: Secondary | ICD-10-CM | POA: Diagnosis not present

## 2023-08-29 DIAGNOSIS — M109 Gout, unspecified: Secondary | ICD-10-CM | POA: Diagnosis not present

## 2023-08-29 DIAGNOSIS — M1712 Unilateral primary osteoarthritis, left knee: Secondary | ICD-10-CM | POA: Diagnosis not present

## 2023-08-29 DIAGNOSIS — M6250 Muscle wasting and atrophy, not elsewhere classified, unspecified site: Secondary | ICD-10-CM | POA: Diagnosis not present

## 2023-08-29 DIAGNOSIS — M25462 Effusion, left knee: Secondary | ICD-10-CM | POA: Diagnosis not present

## 2023-08-29 NOTE — Telephone Encounter (Signed)
 Copied from CRM [id number]. Topic: General - Other >> Aug 29, 2023 10:24 AM Chiquita SQUIBB wrote: Reason for CRM: [name], a PT with [facility] is calling in to let Dr. Garald know that the patient went to the hospital for left knee pain and was referred to [facility] for PT, he stated he is with the patient now and the knee is warm to the touch and swollen. He wanted to make Dr. Garald aware of this. Please advise [name] at [phone] with any questions. [name] is going to inform [facility] as well.

## 2023-08-30 DIAGNOSIS — Z4789 Encounter for other orthopedic aftercare: Secondary | ICD-10-CM | POA: Diagnosis not present

## 2023-08-30 DIAGNOSIS — M25462 Effusion, left knee: Secondary | ICD-10-CM | POA: Diagnosis not present

## 2023-08-31 ENCOUNTER — Telehealth: Payer: Self-pay | Admitting: Internal Medicine

## 2023-08-31 NOTE — Telephone Encounter (Signed)
 Noted.  Agree with plan.  She can take a Medrol  pack.  Thank you

## 2023-08-31 NOTE — Telephone Encounter (Signed)
 Jeffrey Carter dropped off document FMLA, to be filled out by provider. Patient requested to send it back via Fax within 7-days. Document is located in providers tray at front office.Please advise at [phone] for Jeffrey Carter.  They would also like a physical copy for pick up.

## 2023-09-03 DIAGNOSIS — M6281 Muscle weakness (generalized): Secondary | ICD-10-CM | POA: Diagnosis not present

## 2023-09-03 DIAGNOSIS — M6250 Muscle wasting and atrophy, not elsewhere classified, unspecified site: Secondary | ICD-10-CM | POA: Diagnosis not present

## 2023-09-08 DIAGNOSIS — M25462 Effusion, left knee: Secondary | ICD-10-CM | POA: Diagnosis not present

## 2023-09-08 DIAGNOSIS — M109 Gout, unspecified: Secondary | ICD-10-CM | POA: Diagnosis not present

## 2023-09-08 DIAGNOSIS — M1712 Unilateral primary osteoarthritis, left knee: Secondary | ICD-10-CM | POA: Diagnosis not present

## 2023-09-08 DIAGNOSIS — I10 Essential (primary) hypertension: Secondary | ICD-10-CM | POA: Diagnosis not present

## 2023-09-08 DIAGNOSIS — I480 Paroxysmal atrial fibrillation: Secondary | ICD-10-CM | POA: Diagnosis not present

## 2023-09-08 DIAGNOSIS — N320 Bladder-neck obstruction: Secondary | ICD-10-CM | POA: Diagnosis not present

## 2023-09-08 DIAGNOSIS — N401 Enlarged prostate with lower urinary tract symptoms: Secondary | ICD-10-CM

## 2023-09-08 DIAGNOSIS — R338 Other retention of urine: Secondary | ICD-10-CM | POA: Diagnosis not present

## 2023-09-08 DIAGNOSIS — C61 Malignant neoplasm of prostate: Secondary | ICD-10-CM | POA: Diagnosis not present

## 2023-09-08 DIAGNOSIS — I719 Aortic aneurysm of unspecified site, without rupture: Secondary | ICD-10-CM | POA: Diagnosis not present

## 2023-09-08 DIAGNOSIS — N179 Acute kidney failure, unspecified: Secondary | ICD-10-CM | POA: Diagnosis not present

## 2023-09-08 DIAGNOSIS — D630 Anemia in neoplastic disease: Secondary | ICD-10-CM | POA: Diagnosis not present

## 2023-09-12 DIAGNOSIS — M1712 Unilateral primary osteoarthritis, left knee: Secondary | ICD-10-CM | POA: Diagnosis not present

## 2023-09-12 DIAGNOSIS — E876 Hypokalemia: Secondary | ICD-10-CM | POA: Diagnosis not present

## 2023-09-12 DIAGNOSIS — N320 Bladder-neck obstruction: Secondary | ICD-10-CM | POA: Diagnosis not present

## 2023-09-12 DIAGNOSIS — K219 Gastro-esophageal reflux disease without esophagitis: Secondary | ICD-10-CM | POA: Diagnosis not present

## 2023-09-12 DIAGNOSIS — M109 Gout, unspecified: Secondary | ICD-10-CM | POA: Diagnosis not present

## 2023-09-12 DIAGNOSIS — I10 Essential (primary) hypertension: Secondary | ICD-10-CM | POA: Diagnosis not present

## 2023-09-12 DIAGNOSIS — J309 Allergic rhinitis, unspecified: Secondary | ICD-10-CM | POA: Diagnosis not present

## 2023-09-12 DIAGNOSIS — M6250 Muscle wasting and atrophy, not elsewhere classified, unspecified site: Secondary | ICD-10-CM | POA: Diagnosis not present

## 2023-09-12 DIAGNOSIS — R338 Other retention of urine: Secondary | ICD-10-CM | POA: Diagnosis not present

## 2023-09-12 DIAGNOSIS — D630 Anemia in neoplastic disease: Secondary | ICD-10-CM | POA: Diagnosis not present

## 2023-09-12 DIAGNOSIS — I719 Aortic aneurysm of unspecified site, without rupture: Secondary | ICD-10-CM | POA: Diagnosis not present

## 2023-09-12 DIAGNOSIS — R2689 Other abnormalities of gait and mobility: Secondary | ICD-10-CM | POA: Diagnosis not present

## 2023-09-12 DIAGNOSIS — N179 Acute kidney failure, unspecified: Secondary | ICD-10-CM | POA: Diagnosis not present

## 2023-09-12 DIAGNOSIS — K5909 Other constipation: Secondary | ICD-10-CM | POA: Diagnosis not present

## 2023-09-12 DIAGNOSIS — I480 Paroxysmal atrial fibrillation: Secondary | ICD-10-CM | POA: Diagnosis not present

## 2023-09-12 DIAGNOSIS — M25562 Pain in left knee: Secondary | ICD-10-CM | POA: Diagnosis not present

## 2023-09-12 DIAGNOSIS — M25462 Effusion, left knee: Secondary | ICD-10-CM | POA: Diagnosis not present

## 2023-09-12 DIAGNOSIS — G4733 Obstructive sleep apnea (adult) (pediatric): Secondary | ICD-10-CM | POA: Diagnosis not present

## 2023-09-12 DIAGNOSIS — R739 Hyperglycemia, unspecified: Secondary | ICD-10-CM | POA: Diagnosis not present

## 2023-09-12 DIAGNOSIS — D6869 Other thrombophilia: Secondary | ICD-10-CM | POA: Diagnosis not present

## 2023-09-12 DIAGNOSIS — H6993 Unspecified Eustachian tube disorder, bilateral: Secondary | ICD-10-CM | POA: Diagnosis not present

## 2023-09-21 ENCOUNTER — Telehealth: Payer: Self-pay

## 2023-09-21 DIAGNOSIS — H6993 Unspecified Eustachian tube disorder, bilateral: Secondary | ICD-10-CM | POA: Diagnosis not present

## 2023-09-21 DIAGNOSIS — M109 Gout, unspecified: Secondary | ICD-10-CM | POA: Diagnosis not present

## 2023-09-21 DIAGNOSIS — I480 Paroxysmal atrial fibrillation: Secondary | ICD-10-CM | POA: Diagnosis not present

## 2023-09-21 DIAGNOSIS — E876 Hypokalemia: Secondary | ICD-10-CM | POA: Diagnosis not present

## 2023-09-21 DIAGNOSIS — K5909 Other constipation: Secondary | ICD-10-CM | POA: Diagnosis not present

## 2023-09-21 DIAGNOSIS — N179 Acute kidney failure, unspecified: Secondary | ICD-10-CM | POA: Diagnosis not present

## 2023-09-21 DIAGNOSIS — I719 Aortic aneurysm of unspecified site, without rupture: Secondary | ICD-10-CM | POA: Diagnosis not present

## 2023-09-21 DIAGNOSIS — D6869 Other thrombophilia: Secondary | ICD-10-CM | POA: Diagnosis not present

## 2023-09-21 DIAGNOSIS — R739 Hyperglycemia, unspecified: Secondary | ICD-10-CM | POA: Diagnosis not present

## 2023-09-21 DIAGNOSIS — M1712 Unilateral primary osteoarthritis, left knee: Secondary | ICD-10-CM | POA: Diagnosis not present

## 2023-09-21 DIAGNOSIS — G4733 Obstructive sleep apnea (adult) (pediatric): Secondary | ICD-10-CM | POA: Diagnosis not present

## 2023-09-21 DIAGNOSIS — R338 Other retention of urine: Secondary | ICD-10-CM | POA: Diagnosis not present

## 2023-09-21 DIAGNOSIS — R2689 Other abnormalities of gait and mobility: Secondary | ICD-10-CM | POA: Diagnosis not present

## 2023-09-21 DIAGNOSIS — N320 Bladder-neck obstruction: Secondary | ICD-10-CM | POA: Diagnosis not present

## 2023-09-21 DIAGNOSIS — M6250 Muscle wasting and atrophy, not elsewhere classified, unspecified site: Secondary | ICD-10-CM | POA: Diagnosis not present

## 2023-09-21 DIAGNOSIS — K219 Gastro-esophageal reflux disease without esophagitis: Secondary | ICD-10-CM | POA: Diagnosis not present

## 2023-09-21 DIAGNOSIS — M25462 Effusion, left knee: Secondary | ICD-10-CM | POA: Diagnosis not present

## 2023-09-21 DIAGNOSIS — E785 Hyperlipidemia, unspecified: Secondary | ICD-10-CM | POA: Diagnosis not present

## 2023-09-21 DIAGNOSIS — J309 Allergic rhinitis, unspecified: Secondary | ICD-10-CM | POA: Diagnosis not present

## 2023-09-21 DIAGNOSIS — D630 Anemia in neoplastic disease: Secondary | ICD-10-CM | POA: Diagnosis not present

## 2023-09-21 NOTE — Telephone Encounter (Signed)
 Copied from CRM [id number]. Topic: Clinical - Medical Advice >> Sep 21, 2023 10:29 AM Franky GRADE wrote: Reason for CRM: [name] physical therapist assistant with [facility] calling with on going issues with blood pressure readings, patient states he takes his blood pressure medication but readings have always been high, 9:44 am left arm 170/107, after seated exercises the reading again on the left arm was 180/100, he immideiately took the reading on the right arm and it was 171/81. The reading from last week was  167/95. Per high blood pressure concerns PT will need to be on hold. Patient is not experiencing symptoms.

## 2023-09-22 DIAGNOSIS — M25462 Effusion, left knee: Secondary | ICD-10-CM | POA: Diagnosis not present

## 2023-09-22 NOTE — Telephone Encounter (Signed)
 Copy of FMLA paperwork has been placed up front ready for pick up.  Fmla paperwork was faxed in to [employer].

## 2023-09-22 NOTE — Telephone Encounter (Unsigned)
 Copied from CRM #[id number]. Topic: General - Call Back - No Documentation >> Sep 22, 2023  3:05 PM Taleah C wrote: Reason for CRM: pt's son called and stated that he missed a call from [facility] about his FMLA paperwork being ready for pickup. However, no documentation In chart regarding encounter. He would like confirmation if the forms were also faxed to [employer]. Please call and advise.

## 2023-09-22 NOTE — Telephone Encounter (Signed)
 Attempted to reach Ron again as it has been documented that paperwork is ready for pick prior to his call.

## 2023-09-27 DIAGNOSIS — I480 Paroxysmal atrial fibrillation: Secondary | ICD-10-CM | POA: Diagnosis not present

## 2023-09-27 DIAGNOSIS — D6869 Other thrombophilia: Secondary | ICD-10-CM | POA: Diagnosis not present

## 2023-09-27 DIAGNOSIS — M25462 Effusion, left knee: Secondary | ICD-10-CM | POA: Diagnosis not present

## 2023-09-27 DIAGNOSIS — N320 Bladder-neck obstruction: Secondary | ICD-10-CM | POA: Diagnosis not present

## 2023-09-27 DIAGNOSIS — N179 Acute kidney failure, unspecified: Secondary | ICD-10-CM | POA: Diagnosis not present

## 2023-09-27 DIAGNOSIS — I719 Aortic aneurysm of unspecified site, without rupture: Secondary | ICD-10-CM | POA: Diagnosis not present

## 2023-09-27 DIAGNOSIS — G4733 Obstructive sleep apnea (adult) (pediatric): Secondary | ICD-10-CM | POA: Diagnosis not present

## 2023-09-27 DIAGNOSIS — M109 Gout, unspecified: Secondary | ICD-10-CM | POA: Diagnosis not present

## 2023-09-27 DIAGNOSIS — R338 Other retention of urine: Secondary | ICD-10-CM | POA: Diagnosis not present

## 2023-09-27 DIAGNOSIS — M6250 Muscle wasting and atrophy, not elsewhere classified, unspecified site: Secondary | ICD-10-CM | POA: Diagnosis not present

## 2023-09-27 DIAGNOSIS — E785 Hyperlipidemia, unspecified: Secondary | ICD-10-CM | POA: Diagnosis not present

## 2023-09-27 DIAGNOSIS — E876 Hypokalemia: Secondary | ICD-10-CM | POA: Diagnosis not present

## 2023-09-27 DIAGNOSIS — H6993 Unspecified Eustachian tube disorder, bilateral: Secondary | ICD-10-CM | POA: Diagnosis not present

## 2023-09-27 DIAGNOSIS — R2689 Other abnormalities of gait and mobility: Secondary | ICD-10-CM | POA: Diagnosis not present

## 2023-09-27 DIAGNOSIS — K219 Gastro-esophageal reflux disease without esophagitis: Secondary | ICD-10-CM | POA: Diagnosis not present

## 2023-09-27 DIAGNOSIS — M1712 Unilateral primary osteoarthritis, left knee: Secondary | ICD-10-CM | POA: Diagnosis not present

## 2023-09-27 DIAGNOSIS — J309 Allergic rhinitis, unspecified: Secondary | ICD-10-CM | POA: Diagnosis not present

## 2023-09-27 DIAGNOSIS — R739 Hyperglycemia, unspecified: Secondary | ICD-10-CM | POA: Diagnosis not present

## 2023-09-27 DIAGNOSIS — I10 Essential (primary) hypertension: Secondary | ICD-10-CM | POA: Diagnosis not present

## 2023-09-27 DIAGNOSIS — D630 Anemia in neoplastic disease: Secondary | ICD-10-CM | POA: Diagnosis not present

## 2023-09-27 DIAGNOSIS — K5909 Other constipation: Secondary | ICD-10-CM | POA: Diagnosis not present

## 2023-09-28 ENCOUNTER — Other Ambulatory Visit: Payer: Self-pay

## 2023-09-28 MED ORDER — CLONIDINE HCL 0.3 MG PO TABS: 0.3000 mg | ORAL_TABLET | Freq: Three times a day (TID) | ORAL | 3 refills | Status: AC

## 2023-09-28 MED ORDER — CLONIDINE HCL 0.3 MG PO TABS: 0.3000 mg | ORAL_TABLET | Freq: Three times a day (TID) | ORAL | 3 refills | Status: DC

## 2023-09-28 NOTE — Telephone Encounter (Signed)
 Spoke with patients daughter Heron, who is listed as his preferred contact she confirmed name & DOB. Made her aware of medication change, she requested we sent it to [facility] on [address] in [place] as patient is now living with them until he can complete PT and get back on his own. Medication resent as requested

## 2023-09-28 NOTE — Telephone Encounter (Signed)
 Noted.  Jeffrey Carter can start taking clonidine  0.3 mg 3 times a day instead of twice a day.  A new prescription was sent to [facility].  Thank you

## 2023-10-03 DIAGNOSIS — R2689 Other abnormalities of gait and mobility: Secondary | ICD-10-CM | POA: Diagnosis not present

## 2023-10-03 DIAGNOSIS — K5909 Other constipation: Secondary | ICD-10-CM | POA: Diagnosis not present

## 2023-10-03 DIAGNOSIS — N320 Bladder-neck obstruction: Secondary | ICD-10-CM | POA: Diagnosis not present

## 2023-10-03 DIAGNOSIS — K219 Gastro-esophageal reflux disease without esophagitis: Secondary | ICD-10-CM | POA: Diagnosis not present

## 2023-10-03 DIAGNOSIS — M6250 Muscle wasting and atrophy, not elsewhere classified, unspecified site: Secondary | ICD-10-CM | POA: Diagnosis not present

## 2023-10-03 DIAGNOSIS — G4733 Obstructive sleep apnea (adult) (pediatric): Secondary | ICD-10-CM | POA: Diagnosis not present

## 2023-10-03 DIAGNOSIS — R739 Hyperglycemia, unspecified: Secondary | ICD-10-CM | POA: Diagnosis not present

## 2023-10-03 DIAGNOSIS — I480 Paroxysmal atrial fibrillation: Secondary | ICD-10-CM | POA: Diagnosis not present

## 2023-10-03 DIAGNOSIS — M1712 Unilateral primary osteoarthritis, left knee: Secondary | ICD-10-CM | POA: Diagnosis not present

## 2023-10-03 DIAGNOSIS — J309 Allergic rhinitis, unspecified: Secondary | ICD-10-CM | POA: Diagnosis not present

## 2023-10-03 DIAGNOSIS — H6993 Unspecified Eustachian tube disorder, bilateral: Secondary | ICD-10-CM | POA: Diagnosis not present

## 2023-10-03 DIAGNOSIS — R338 Other retention of urine: Secondary | ICD-10-CM | POA: Diagnosis not present

## 2023-10-03 DIAGNOSIS — D6869 Other thrombophilia: Secondary | ICD-10-CM | POA: Diagnosis not present

## 2023-10-03 DIAGNOSIS — E876 Hypokalemia: Secondary | ICD-10-CM | POA: Diagnosis not present

## 2023-10-03 DIAGNOSIS — E785 Hyperlipidemia, unspecified: Secondary | ICD-10-CM | POA: Diagnosis not present

## 2023-10-03 DIAGNOSIS — M25462 Effusion, left knee: Secondary | ICD-10-CM | POA: Diagnosis not present

## 2023-10-03 DIAGNOSIS — D630 Anemia in neoplastic disease: Secondary | ICD-10-CM | POA: Diagnosis not present

## 2023-10-03 DIAGNOSIS — N179 Acute kidney failure, unspecified: Secondary | ICD-10-CM | POA: Diagnosis not present

## 2023-10-03 DIAGNOSIS — M109 Gout, unspecified: Secondary | ICD-10-CM | POA: Diagnosis not present

## 2023-10-03 DIAGNOSIS — I719 Aortic aneurysm of unspecified site, without rupture: Secondary | ICD-10-CM | POA: Diagnosis not present

## 2023-10-03 DIAGNOSIS — I10 Essential (primary) hypertension: Secondary | ICD-10-CM | POA: Diagnosis not present

## 2023-10-04 ENCOUNTER — Encounter: Payer: Self-pay | Admitting: Internal Medicine

## 2023-10-04 ENCOUNTER — Ambulatory Visit: Admitting: Internal Medicine

## 2023-10-04 VITALS — BP 154/100 | HR 73 | Temp 98.5°F | Ht 69.0 in | Wt 211.4 lb

## 2023-10-04 DIAGNOSIS — I10 Essential (primary) hypertension: Secondary | ICD-10-CM | POA: Diagnosis not present

## 2023-10-04 DIAGNOSIS — I716 Thoracoabdominal aortic aneurysm, without rupture, unspecified: Secondary | ICD-10-CM

## 2023-10-04 DIAGNOSIS — M6250 Muscle wasting and atrophy, not elsewhere classified, unspecified site: Secondary | ICD-10-CM | POA: Diagnosis not present

## 2023-10-04 DIAGNOSIS — Z23 Encounter for immunization: Secondary | ICD-10-CM | POA: Diagnosis not present

## 2023-10-04 DIAGNOSIS — M1000 Idiopathic gout, unspecified site: Secondary | ICD-10-CM

## 2023-10-04 DIAGNOSIS — M6281 Muscle weakness (generalized): Secondary | ICD-10-CM | POA: Diagnosis not present

## 2023-10-04 MED ORDER — PREDNISONE 10 MG PO TABS: ORAL_TABLET | ORAL | 1 refills | Status: DC

## 2023-10-04 NOTE — Assessment & Plan Note (Signed)
 Needs better BP control Re-start Clonidine  0.3 mg bid, titrate to tid

## 2023-10-04 NOTE — Assessment & Plan Note (Signed)
 Recurrent. Prednisone  po prn - see Rx, Norco prn. Use prn. Allopurinol  option was discussed - pt agreed  Potential benefits of a short term opioids use as well as potential risks (i.e. addiction risk, apnea etc) and complications (i.e. Somnolence, constipation and others) were explained to the patient and were aknowledged.

## 2023-10-04 NOTE — Progress Notes (Signed)
 Subjective:  Patient ID: Jeffrey Carter, male    DOB: 09/06/1940  Age: 83 y.o. MRN: [id number]  CC: Follow-up (Patient has concerns with blood pressure. )   HPI BURR SOFFER presents for HTN - not taking Clonidine  yet Here w/son Chyrl F/u on gout, L knee pain   Outpatient Medications Prior to Visit  Medication Sig Dispense Refill   allopurinol  (ZYLOPRIM ) 100 MG tablet Take 0.5 tablets (50 mg total) by mouth daily. 45 tablet 3   apixaban  (ELIQUIS ) 5 MG TABS tablet Take 1 tablet (5 mg total) by mouth 2 (two) times daily. 60 tablet 4   carvedilol  (COREG ) 25 MG tablet Take 1 tablet (25 mg total) by mouth 2 (two) times daily.     cloNIDine  (CATAPRES ) 0.3 MG tablet Take 1 tablet (0.3 mg total) by mouth 3 (three) times daily. 270 tablet 3   diclofenac  Sodium (VOLTAREN ) 1 % GEL Apply 4 g topically 4 (four) times daily as needed (knee pain).     diltiazem  (CARDIZEM  CD) 300 MG 24 hr capsule Take 1 capsule (300 mg total) by mouth daily.     diphenoxylate -atropine  (LOMOTIL ) 2.5-0.025 MG tablet Take 1-2 tablets by mouth 4 (four) times daily as needed for diarrhea or loose stools. 60 tablet 0   EQ ALLERGY RELIEF 10 MG tablet TAKE ONE TABLET BY MOUTH ONCE DAILY (Patient taking differently: Take 10 mg by mouth every evening.) 90 tablet 3   finasteride  (PROSCAR ) 5 MG tablet Take 5 mg by mouth daily.     fluticasone  (FLONASE ) 50 MCG/ACT nasal spray Place 2 sprays into both nostrils daily. (Patient taking differently: Place 2 sprays into both nostrils daily as needed for allergies or rhinitis.) 16 g 2   HYDROcodone -acetaminophen  (NORCO) 7.5-325 MG tablet Take 1 tablet by mouth 2 (two) times daily as needed for moderate pain (pain score 4-6). 60 tablet 0   losartan  (COZAAR ) 100 MG tablet Take 1 tablet by mouth once daily 90 tablet 3   lovastatin  (MEVACOR ) 40 MG tablet TAKE 1 TABLET BY MOUTH ONCE DAILY IN THE EVENING AT  6PM (Patient taking differently: Take 40 mg by mouth daily at 6 PM.) 90 tablet 2    methocarbamol  (ROBAXIN ) 500 MG tablet Take 1 tablet (500 mg total) by mouth 2 (two) times daily. (Patient taking differently: Take 500 mg by mouth 2 (two) times daily as needed for muscle spasms.) 10 tablet 0   methylPREDNISolone  (MEDROL  DOSEPAK) 4 MG TBPK tablet As directed 21 tablet 0   ondansetron  (ZOFRAN -ODT) 4 MG disintegrating tablet Take 1 tablet (4 mg total) by mouth every 8 (eight) hours as needed for nausea or vomiting. (Patient taking differently: Take 4 mg by mouth every 8 (eight) hours as needed for nausea or vomiting (DISSOLVE ORALLY).) 20 tablet 0   pantoprazole  (PROTONIX ) 40 MG tablet Take 1 tablet (40 mg total) by mouth daily. 30 tablet 1   potassium chloride  (KLOR-CON  M) 10 MEQ tablet Take 1 tablet (10 mEq total) by mouth 2 (two) times daily. 30 tablet 0   senna-docusate (SENOKOT-S) 8.6-50 MG tablet Take 1 tablet by mouth at bedtime as needed for mild constipation.     tamsulosin  (FLOMAX ) 0.4 MG CAPS capsule Take 1 capsule (0.4 mg total) by mouth daily. 90 capsule 1   No facility-administered medications prior to visit.    ROS: Review of Systems  Constitutional:  Negative for appetite change, fatigue and unexpected weight change.  HENT:  Negative for congestion, nosebleeds, sneezing, sore throat, trouble  swallowing and voice change.   Eyes:  Negative for itching and visual disturbance.  Respiratory:  Negative for cough.   Cardiovascular:  Negative for chest pain, palpitations and leg swelling.  Gastrointestinal:  Negative for abdominal distention, blood in stool, diarrhea and nausea.  Genitourinary:  Negative for frequency and hematuria.  Musculoskeletal:  Positive for arthralgias and gait problem. Negative for back pain, joint swelling and neck pain.  Skin:  Negative for rash.  Neurological:  Negative for dizziness, tremors, speech difficulty and weakness.  Psychiatric/Behavioral:  Negative for agitation, dysphoric mood and sleep disturbance. The patient is not  nervous/anxious.     Objective:  BP (!) 154/100   Pulse 73   Temp 98.5 F (36.9 C) (Oral)   Ht 5' 9 (1.753 m)   Wt 211 lb 6.4 oz (95.9 kg)   SpO2 94%   BMI 31.22 kg/m   BP Readings from Last 3 Encounters:  10/04/23 (!) 154/100  08/17/23 (!) 160/80  08/10/23 (!) 171/96    Wt Readings from Last 3 Encounters:  10/04/23 211 lb 6.4 oz (95.9 kg)  08/17/23 212 lb (96.2 kg)  08/10/23 212 lb 14.4 oz (96.6 kg)    Physical Exam Constitutional:      General: He is not in acute distress.    Appearance: He is well-developed. He is obese.     Comments: NAD  Eyes:     Conjunctiva/sclera: Conjunctivae normal.     Pupils: Pupils are equal, round, and reactive to light.  Neck:     Thyroid : No thyromegaly.     Vascular: No JVD.  Cardiovascular:     Rate and Rhythm: Normal rate and regular rhythm.     Heart sounds: Normal heart sounds. No murmur heard.    No friction rub. No gallop.  Pulmonary:     Effort: Pulmonary effort is normal. No respiratory distress.     Breath sounds: Normal breath sounds. No wheezing or rales.  Chest:     Chest wall: No tenderness.  Abdominal:     General: Bowel sounds are normal. There is no distension.     Palpations: Abdomen is soft. There is no mass.     Tenderness: There is no abdominal tenderness. There is no guarding or rebound.  Musculoskeletal:        General: No tenderness. Normal range of motion.     Cervical back: Normal range of motion.  Lymphadenopathy:     Cervical: No cervical adenopathy.  Skin:    General: Skin is warm and dry.     Findings: No rash.  Neurological:     Mental Status: He is alert and oriented to person, place, and time.     Cranial Nerves: No cranial nerve deficit.     Motor: No abnormal muscle tone.     Coordination: Coordination normal.     Gait: Gait abnormal.     Deep Tendon Reflexes: Reflexes are normal and symmetric.  Psychiatric:        Behavior: Behavior normal.        Thought Content: Thought content  normal.        Judgment: Judgment normal.    L knee w/pain  Lab Results  Component Value Date   WBC 5.9 08/17/2023   HGB 11.4 (L) 08/17/2023   HCT 34.4 (L) 08/17/2023   PLT 302.0 08/17/2023   GLUCOSE 104 (H) 08/17/2023   CHOL 131 10/15/2019   TRIG 67 10/15/2019   HDL 43 10/15/2019   LDLCALC 74  10/15/2019   ALT 19 08/17/2023   AST 13 08/17/2023   NA 140 08/17/2023   K 3.3 (L) 08/17/2023   CL 101 08/17/2023   CREATININE 1.03 08/17/2023   BUN 21 08/17/2023   CO2 32 08/17/2023   TSH 2.38 08/17/2023   PSA 0.17 04/03/2023   INR 1.01 10/12/2014   HGBA1C 6.2 06/14/2021    VAS US  LOWER EXTREMITY VENOUS (DVT) (ONLY [facility] & [facility]) Result Date: 07/09/2023  Lower Venous DVT Study Patient Name:  RONDARIUS KADRMAS  Date of Exam:   07/08/2023 Medical Rec #: [id number]         Accession #:    [id number] Date of Birth: 10/22/1940         Patient Gender: M Patient Age:   22 years Exam Location:  [facility] Procedure:      VAS US  LOWER EXTREMITY VENOUS (DVT) Referring Phys: DARRYLE BIRMINGHAM --------------------------------------------------------------------------------  Indications: Swelling, and Pain of left knee. Unable to bear weight on leg. 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  Limitations: Patient unable to position knee secondary to pain. Comparison Study: No prior study Performing Technologist: Alberta Lis RVS  Examination Guidelines: A complete evaluation includes B-mode imaging, spectral Doppler, color Doppler, and power Doppler as needed of all accessible portions of each vessel. Bilateral testing is considered an integral part of a complete examination. Limited examinations for reoccurring indications may be performed as noted. The reflux portion of the exam is performed with the patient in reverse Trendelenburg.  +-----+---------------+---------+-----------+----------+--------------+  RIGHTCompressibilityPhasicitySpontaneityPropertiesThrombus Aging +-----+---------------+---------+-----------+----------+--------------+ CFV  Full           Yes      No                                  +-----+---------------+---------+-----------+----------+--------------+ SFJ  Full                                                        +-----+---------------+---------+-----------+----------+--------------+   +---------+---------------+---------+-----------+----------+-------------------+ LEFT     CompressibilityPhasicitySpontaneityPropertiesThrombus Aging      +---------+---------------+---------+-----------+----------+-------------------+ CFV      Full           Yes      No                                       +---------+---------------+---------+-----------+----------+-------------------+ SFJ      Full                                                             +---------+---------------+---------+-----------+----------+-------------------+ FV Prox  Full           Yes      No                                       +---------+---------------+---------+-----------+----------+-------------------+  FV Mid   Full           Yes      No                                       +---------+---------------+---------+-----------+----------+-------------------+ FV DistalFull                                                             +---------+---------------+---------+-----------+----------+-------------------+ PFV      Full           Yes      No                                       +---------+---------------+---------+-----------+----------+-------------------+ POP                     Yes      No                   patent by color and                                                       Doppler             +---------+---------------+---------+-----------+----------+-------------------+ PTV      Full                                                              +---------+---------------+---------+-----------+----------+-------------------+ PERO     Full                                                             +---------+---------------+---------+-----------+----------+-------------------+ TP trunk Full                                                             +---------+---------------+---------+-----------+----------+-------------------+     Summary: RIGHT: - No evidence of common femoral vein obstruction.   LEFT: - There is no evidence of deep vein thrombosis in the lower extremity.  - Effusion noted medial left knee  *See table(s) above for measurements and observations. Electronically signed by Gaile New MD on 07/09/2023 at 9:08:05 AM.    Final    DG Knee Complete 4 Views Left Result Date: 07/08/2023 CLINICAL DATA:  Left knee pain and swelling, inability to bear weight, warm to touch EXAM: LEFT KNEE - COMPLETE 4+ VIEW COMPARISON:  None Available. FINDINGS: Frontal, bilateral oblique, and lateral views of the left knee are obtained. No acute fracture, subluxation, or dislocation. Severe 3 compartmental osteoarthritis greatest in the lateral compartment. Large suprapatellar joint effusion. Mild subcutaneous edema throughout the left knee. IMPRESSION: 1. Large left knee effusion. 2. Mild diffuse subcutaneous edema. 3. No acute or destructive bony abnormalities. 4. Severe 3 compartmental osteoarthritis, greatest laterally. Electronically Signed   By: Ozell Daring M.D.   On: 07/08/2023 13:26    Assessment & Plan:   Problem List Items Addressed This Visit     Aortic aneurysm (HCC)   Needs better BP control Re-start Clonidine  0.3 mg bid, titrate to tid       Essential hypertension   Needs better BP control Re-start Clonidine  0.3 mg bid, titrate to tid       Gout attack   Recurrent. Prednisone  po prn - see Rx, Norco prn. Use prn. Allopurinol  option was discussed - pt agreed  Potential benefits of a short  term opioids use as well as potential risks (i.e. addiction risk, apnea etc) and complications (i.e. Somnolence, constipation and others) were explained to the patient and were aknowledged.      Other Visit Diagnoses       Immunization due    -  Primary   Relevant Orders   Flu vaccine HIGH DOSE PF(Fluzone Trivalent) (Completed)         Meds ordered this encounter  Medications   predniSONE  (DELTASONE ) 10 MG tablet    Sig: Prednisone  10 mg: take 4 tabs a day x 3 days; then 3 tabs a day x 4 days; then 2 tabs a day x 4 days, then 1 tab a day x 6 days, then stop. Take pc.    Dispense:  38 tablet    Refill:  1      Follow-up: No follow-ups on file.  Marolyn Noel, MD

## 2023-10-05 ENCOUNTER — Encounter: Payer: Self-pay | Admitting: Internal Medicine

## 2023-10-09 NOTE — Progress Notes (Unsigned)
 Cardiology Office Note:   Date:  10/12/2023  ID:  Jeffrey Carter, DOB 20-Jan-1941, MRN [id number] PCP:  Garald Karlynn GAILS, MD  [facility] Providers Cardiologist:  Wendel Haws, MD Referring MD: Nellene Quita SAUNDERS, PA  Chief Complaint/Reason for Referral: Atrial fibrillation ASSESSMENT:    1. Paroxysmal atrial fibrillation (HCC)   2. Secondary hypercoagulable state (HCC)   3. Aortic dilatation (HCC)   4. Primary hypertension   5. Hyperlipidemia LDL goal <70   6. Aortic atherosclerosis (HCC)   7. CKD (chronic kidney disease) stage 2, GFR 60-89 ml/min   8. BMI 31.0-31.9,adult   9. Prediabetes     PLAN:   In order of problems listed above: PAF: Restart Eliquis  5 twice daily, Coreg  25 mg twice daily, diltiazem  300 mg daily.  We provided 14 days samples, 30-day card, and patient's assistance documentation Secondary hypercoagulable state: Continue Eliquis  5 twice daily Aortic dilatation: Seen on echocardiogram; obtain chest CTA to evaluate further Hypertension: Continue Coreg  25 mg twice daily, losartan  100 mg, diltiazem  300 mg, start hydrochlorothiazide  25 mg Hyperlipidemia: Continue lovastatin  40 mg; check lipid panel, LFTs today. Aortic atherosclerosis: Continue Eliquis  5 twice daily, lovastatin  40 CKD stage II: Continue losartan  100 mg Elevated BMI: Continue diet and exercise modification Prediabetes: Hemoglobin A1c was 6.2 in 2023; check hemoglobin A1c today.  Alert PCP if in diabetic range            Dispo:  Return in about 6 months (around 04/10/2024).       I spent 38 minutes reviewing all clinical data during and prior to this visit including all relevant imaging studies, laboratories, clinical information from other health systems and prior notes from both Cardiology and other specialties, interviewing the patient, conducting a complete physical examination, and coordinating care in order to formulate a comprehensive and personalized evaluation and treatment  plan.   History of Present Illness:    FOCUSED PROBLEM LIST:   PAF CV 2 score 3 Aortic dilatation 45 mm TTE April 2025 Hypertension Hyperlipidemia Aortic atherosclerosis CT renal stone 2018 CKD stage II OSA On CPAP BMI 31 Prediabetes  September 2025: Patient consents to use of AI scribe. Patient was referred to establish general cardiovascular care.  The patient has been followed in the atrial fibrillation clinic for some time.  He had a monitor placed in April 2025 which demonstrated atrial fibrillation.  He was admitted with atrial fibrillation with rapid ventricular rate in June.  He was treated with intensified diltiazem .  He was seen by his primary care provider last week.  He was doing well.  His heart rate was in the 70s.  He was in a normal rhythm.  His blood pressure was elevated.  His clonidine  0.3 mg twice daily was restarted.  He has a history of atrial fibrillation and was previously on the anticoagulant Eliquis , but is not currently taking it due to cost concerns. He was discharged on Eliquis  after a hospital stay in June, but his family confirms he has not been taking it recently. No palpitations, chest pain, or lightheadedness.  He has a history of gout, which previously led to hospitalization due to significant swelling in his knee extending to his foot. Fluid has been aspirated from the knee multiple times, and he currently wears special shoes due to previous swelling.  He is on medication for high cholesterol and hypertension, which has been difficult to control over the past few years. No palpitations, chest pain, lightheadedness, syncope, or orthopnea. He has a  history of sleep apnea.  He spends most of his time at home, occasionally walking around, and is enjoying retirement. He has not had his hemoglobin A1c checked recently, although it was borderline for diabetes last year.     Current Medications: Current Meds  Medication Sig   allopurinol  (ZYLOPRIM ) 100  MG tablet Take 0.5 tablets (50 mg total) by mouth daily.   apixaban  (ELIQUIS ) 5 MG TABS tablet Take 1 tablet (5 mg total) by mouth 2 (two) times daily.   apixaban  (ELIQUIS ) 5 MG TABS tablet Take 1 tablet (5 mg total) by mouth 2 (two) times daily.   carvedilol  (COREG ) 25 MG tablet Take 1 tablet (25 mg total) by mouth 2 (two) times daily.   cloNIDine  (CATAPRES ) 0.3 MG tablet Take 1 tablet (0.3 mg total) by mouth 3 (three) times daily.   diclofenac  Sodium (VOLTAREN ) 1 % GEL Apply 4 g topically 4 (four) times daily as needed (knee pain).   diltiazem  (CARDIZEM  CD) 300 MG 24 hr capsule Take 1 capsule (300 mg total) by mouth daily.   diphenoxylate -atropine  (LOMOTIL ) 2.5-0.025 MG tablet Take 1-2 tablets by mouth 4 (four) times daily as needed for diarrhea or loose stools.   EQ ALLERGY RELIEF 10 MG tablet TAKE ONE TABLET BY MOUTH ONCE DAILY (Patient taking differently: Take 10 mg by mouth every evening.)   finasteride  (PROSCAR ) 5 MG tablet Take 5 mg by mouth daily.   fluticasone  (FLONASE ) 50 MCG/ACT nasal spray Place 2 sprays into both nostrils daily. (Patient taking differently: Place 2 sprays into both nostrils daily as needed for allergies or rhinitis.)   furosemide  (LASIX ) 20 MG tablet Take 20 mg by mouth.   hydrochlorothiazide  (HYDRODIURIL ) 25 MG tablet Take 1 tablet (25 mg total) by mouth daily.   HYDROcodone -acetaminophen  (NORCO) 7.5-325 MG tablet Take 1 tablet by mouth 2 (two) times daily as needed for moderate pain (pain score 4-6).   losartan  (COZAAR ) 100 MG tablet Take 1 tablet by mouth once daily   lovastatin  (MEVACOR ) 40 MG tablet TAKE 1 TABLET BY MOUTH ONCE DAILY IN THE EVENING AT  6PM (Patient taking differently: Take 40 mg by mouth daily at 6 PM.)   methocarbamol  (ROBAXIN ) 500 MG tablet Take 1 tablet (500 mg total) by mouth 2 (two) times daily. (Patient taking differently: Take 500 mg by mouth 2 (two) times daily as needed for muscle spasms.)   methylPREDNISolone  (MEDROL  DOSEPAK) 4 MG TBPK  tablet As directed   ondansetron  (ZOFRAN -ODT) 4 MG disintegrating tablet Take 1 tablet (4 mg total) by mouth every 8 (eight) hours as needed for nausea or vomiting. (Patient taking differently: Take 4 mg by mouth every 8 (eight) hours as needed for nausea or vomiting (DISSOLVE ORALLY).)   pantoprazole  (PROTONIX ) 40 MG tablet Take 1 tablet (40 mg total) by mouth daily.   potassium chloride  (KLOR-CON  M) 10 MEQ tablet Take 1 tablet (10 mEq total) by mouth 2 (two) times daily.   predniSONE  (DELTASONE ) 10 MG tablet Prednisone  10 mg: take 4 tabs a day x 3 days; then 3 tabs a day x 4 days; then 2 tabs a day x 4 days, then 1 tab a day x 6 days, then stop. Take pc.   senna-docusate (SENOKOT-S) 8.6-50 MG tablet Take 1 tablet by mouth at bedtime as needed for mild constipation.   tamsulosin  (FLOMAX ) 0.4 MG CAPS capsule Take 1 capsule (0.4 mg total) by mouth daily.     Review of Systems:   Please see the history of present  illness.    All other systems reviewed and are negative.     EKGs/Labs/Other Test Reviewed:   EKG: 2025 atrial fibrillation with RVR and PVCs  EKG Interpretation Date/Time:    Ventricular Rate:    PR Interval:    QRS Duration:    QT Interval:    QTC Calculation:   R Axis:      Text Interpretation:          CARDIAC STUDIES: Refer to CV Procedures and Imaging Tabs   Risk Assessment/Calculations:    CHA2DS2-VASc Score = 3   This indicates a 3.2% annual risk of stroke. The patient's score is based upon: CHF History: 0 HTN History: 1 Diabetes History: 0 Stroke History: 0 Vascular Disease History: 0 Age Score: 2 Gender Score: 0         Physical Exam:   VS:  BP (!) 150/80   Pulse 69   Ht 5' 9 (1.753 m)   Wt 211 lb 9.6 oz (96 kg)   SpO2 97%   BMI 31.25 kg/m    HYPERTENSION CONTROL Vitals:   10/12/23 1538 10/12/23 1617 10/12/23 1618  BP: (!) 154/110 (!) 150/80 (!) 150/80    The patient's blood pressure is elevated above target today.  In order to  address the patient's elevated BP: A new medication was prescribed today.      Wt Readings from Last 3 Encounters:  10/12/23 211 lb 9.6 oz (96 kg)  10/04/23 211 lb 6.4 oz (95.9 kg)  08/17/23 212 lb (96.2 kg)      GENERAL:  No apparent distress, AOx3 HEENT:  No carotid bruits, +2 carotid impulses, no scleral icterus CAR: RRR  no murmurs, gallops, rubs, or thrills RES:  Clear to auscultation bilaterally ABD:  Soft, nontender, nondistended, positive bowel sounds x 4 VASC:  +2 radial pulses, +2 carotid pulses NEURO:  CN 2-12 grossly intact; motor and sensory grossly intact PSYCH:  No active depression or anxiety EXT:  No edema, ecchymosis, or cyanosis  Signed, Marlon Suleiman K Rhonin Trott, MD  10/12/2023 4:41 PM    [facility] [address] [place], [place], [place]  [address] Phone: [phone]; Fax: [phone]   Note:  This document was prepared using Dragon voice recognition software and may include unintentional dictation errors.

## 2023-10-10 DIAGNOSIS — J309 Allergic rhinitis, unspecified: Secondary | ICD-10-CM | POA: Diagnosis not present

## 2023-10-10 DIAGNOSIS — I10 Essential (primary) hypertension: Secondary | ICD-10-CM | POA: Diagnosis not present

## 2023-10-10 DIAGNOSIS — M109 Gout, unspecified: Secondary | ICD-10-CM | POA: Diagnosis not present

## 2023-10-10 DIAGNOSIS — Z556 Problems related to health literacy: Secondary | ICD-10-CM | POA: Diagnosis not present

## 2023-10-10 DIAGNOSIS — M6250 Muscle wasting and atrophy, not elsewhere classified, unspecified site: Secondary | ICD-10-CM | POA: Diagnosis not present

## 2023-10-10 DIAGNOSIS — I719 Aortic aneurysm of unspecified site, without rupture: Secondary | ICD-10-CM | POA: Diagnosis not present

## 2023-10-10 DIAGNOSIS — G4733 Obstructive sleep apnea (adult) (pediatric): Secondary | ICD-10-CM | POA: Diagnosis not present

## 2023-10-10 DIAGNOSIS — D6869 Other thrombophilia: Secondary | ICD-10-CM | POA: Diagnosis not present

## 2023-10-10 DIAGNOSIS — Z9049 Acquired absence of other specified parts of digestive tract: Secondary | ICD-10-CM | POA: Diagnosis not present

## 2023-10-10 DIAGNOSIS — N320 Bladder-neck obstruction: Secondary | ICD-10-CM | POA: Diagnosis not present

## 2023-10-10 DIAGNOSIS — H6993 Unspecified Eustachian tube disorder, bilateral: Secondary | ICD-10-CM | POA: Diagnosis not present

## 2023-10-10 DIAGNOSIS — K219 Gastro-esophageal reflux disease without esophagitis: Secondary | ICD-10-CM | POA: Diagnosis not present

## 2023-10-10 DIAGNOSIS — M1712 Unilateral primary osteoarthritis, left knee: Secondary | ICD-10-CM | POA: Diagnosis not present

## 2023-10-10 DIAGNOSIS — K5909 Other constipation: Secondary | ICD-10-CM | POA: Diagnosis not present

## 2023-10-10 DIAGNOSIS — Z96651 Presence of right artificial knee joint: Secondary | ICD-10-CM | POA: Diagnosis not present

## 2023-10-10 DIAGNOSIS — Z7901 Long term (current) use of anticoagulants: Secondary | ICD-10-CM | POA: Diagnosis not present

## 2023-10-10 DIAGNOSIS — E785 Hyperlipidemia, unspecified: Secondary | ICD-10-CM | POA: Diagnosis not present

## 2023-10-10 DIAGNOSIS — I480 Paroxysmal atrial fibrillation: Secondary | ICD-10-CM | POA: Diagnosis not present

## 2023-10-10 DIAGNOSIS — D630 Anemia in neoplastic disease: Secondary | ICD-10-CM | POA: Diagnosis not present

## 2023-10-10 DIAGNOSIS — R338 Other retention of urine: Secondary | ICD-10-CM | POA: Diagnosis not present

## 2023-10-11 ENCOUNTER — Telehealth: Payer: Self-pay | Admitting: Internal Medicine

## 2023-10-11 NOTE — Telephone Encounter (Signed)
 Prescription Request  10/11/2023  LOV: 10/04/2023  What is the name of the medication or equipment? Potassium Chloride  - Klor  Have you contacted your pharmacy to request a refill? No   Which pharmacy would you like this sent to?  [id number] - [place], [place] - [address] [place] [address] Phone: [phone] Fax: [phone]    Patient notified that their request is being sent to the clinical staff for review and that they should receive a response within 2 business days.   Please advise at Mobile [phone] (mobile)

## 2023-10-12 ENCOUNTER — Ambulatory Visit: Attending: Internal Medicine | Admitting: Internal Medicine

## 2023-10-12 ENCOUNTER — Encounter: Payer: Self-pay | Admitting: Internal Medicine

## 2023-10-12 ENCOUNTER — Other Ambulatory Visit: Payer: Self-pay

## 2023-10-12 VITALS — BP 150/80 | HR 69 | Ht 69.0 in | Wt 211.6 lb

## 2023-10-12 DIAGNOSIS — I77819 Aortic ectasia, unspecified site: Secondary | ICD-10-CM | POA: Diagnosis not present

## 2023-10-12 DIAGNOSIS — E876 Hypokalemia: Secondary | ICD-10-CM

## 2023-10-12 DIAGNOSIS — R7303 Prediabetes: Secondary | ICD-10-CM | POA: Diagnosis not present

## 2023-10-12 DIAGNOSIS — E785 Hyperlipidemia, unspecified: Secondary | ICD-10-CM | POA: Diagnosis not present

## 2023-10-12 DIAGNOSIS — I700 Atherosclerosis of aorta: Secondary | ICD-10-CM

## 2023-10-12 DIAGNOSIS — N182 Chronic kidney disease, stage 2 (mild): Secondary | ICD-10-CM | POA: Diagnosis not present

## 2023-10-12 DIAGNOSIS — I480 Paroxysmal atrial fibrillation: Secondary | ICD-10-CM | POA: Diagnosis not present

## 2023-10-12 DIAGNOSIS — I10 Essential (primary) hypertension: Secondary | ICD-10-CM | POA: Diagnosis not present

## 2023-10-12 DIAGNOSIS — Z6831 Body mass index (BMI) 31.0-31.9, adult: Secondary | ICD-10-CM

## 2023-10-12 DIAGNOSIS — D6869 Other thrombophilia: Secondary | ICD-10-CM

## 2023-10-12 LAB — LIPID PANEL

## 2023-10-12 MED ORDER — HYDROCHLOROTHIAZIDE 25 MG PO TABS: 25.0000 mg | ORAL_TABLET | Freq: Every day | ORAL | 3 refills | Status: DC

## 2023-10-12 MED ORDER — APIXABAN 5 MG PO TABS: 5.0000 mg | ORAL_TABLET | Freq: Two times a day (BID) | ORAL | 0 refills | Status: DC

## 2023-10-12 MED ORDER — POTASSIUM CHLORIDE CRYS ER 10 MEQ PO TBCR: 10.0000 meq | EXTENDED_RELEASE_TABLET | Freq: Two times a day (BID) | ORAL | 0 refills | Status: AC

## 2023-10-12 NOTE — Telephone Encounter (Signed)
 Completed.

## 2023-10-12 NOTE — Patient Instructions (Signed)
 Medication Instructions:  START hydrochlorothiazide  25 mg once daily   RESTART Eliquis  5 mg twice daily   *If you need a refill on your cardiac medications before your next appointment, please call your pharmacy*  Lab Work: To be completed today: hgbA1C, lipid panel, LFT  If you have labs (blood work) drawn today and your tests are completely normal, you will receive your results only by: MyChart Message (if you have MyChart) OR A paper copy in the mail If you have any lab test that is abnormal or we need to change your treatment, we will call you to review the results.  Testing/Procedures: Your provider has requested that you have a chest CTA. Non-Cardiac CT Angiography (CTA), is a special type of CT scan that uses a computer to produce multi-dimensional views of major blood vessels throughout the body. In CT angiography, a contrast material is injected through an IV to help visualize the blood vessels   Follow-Up: At [facility], you and your health needs are our priority.  As part of our continuing mission to provide you with exceptional heart care, our providers are all part of one team.  This team includes your primary Cardiologist (physician) and Advanced Practice Providers or APPs (Physician Assistants and Nurse Practitioners) who all work together to provide you with the care you need, when you need it.  Your next appointment:   6 month(s)  Provider:   One of our Advanced Practice Providers (APPs): Morse Clause, PA-C  Lamarr Satterfield, NP Miriam Shams, NP  Olivia Pavy, PA-C Josefa Beauvais, NP  Leontine Salen, PA-C Orren Fabry, PA-C  [place], PA-C Ernest Dick, NP  Damien Braver, NP Jon Hails, PA-C  Waddell Donath, PA-C    Dayna Dunn, PA-C  Scott Weaver, PA-C Lum Louis, NP Katlyn West, NP Callie Goodrich, PA-C  Xika Zhao, NP Sheng Haley, PA-C    Kathleen Johnson, PA-C

## 2023-10-13 ENCOUNTER — Ambulatory Visit: Payer: Self-pay | Admitting: Internal Medicine

## 2023-10-13 DIAGNOSIS — I716 Thoracoabdominal aortic aneurysm, without rupture, unspecified: Secondary | ICD-10-CM

## 2023-10-13 DIAGNOSIS — I77819 Aortic ectasia, unspecified site: Secondary | ICD-10-CM

## 2023-10-13 LAB — HEPATIC FUNCTION PANEL
ALT: 11 IU/L (ref 0–44)
AST: 11 IU/L (ref 0–40)
Albumin: 3.7 g/dL (ref 3.7–4.7)
Alkaline Phosphatase: 128 IU/L (ref 48–129)
Bilirubin Total: 0.4 mg/dL (ref 0.0–1.2)
Bilirubin, Direct: 0.16 mg/dL (ref 0.00–0.40)
Total Protein: 6 g/dL (ref 6.0–8.5)

## 2023-10-13 LAB — LIPID PANEL
Cholesterol, Total: 128 mg/dL (ref 100–199)
HDL: 49 mg/dL (ref >39)
LDL CALC COMMENT:: 2.6 ratio (ref 0.0–5.0)
LDL Chol Calc (NIH): 66 mg/dL (ref 0–99)
Triglycerides: 63 mg/dL (ref 0–149)
VLDL Cholesterol Cal: 13 mg/dL (ref 5–40)

## 2023-10-13 LAB — HEMOGLOBIN A1C
Est. average glucose Bld gHb Est-mCnc: 114 mg/dL
Hgb A1c MFr Bld: 5.6 % (ref 4.8–5.6)

## 2023-10-16 ENCOUNTER — Telehealth: Payer: Self-pay

## 2023-10-16 NOTE — Telephone Encounter (Signed)
 Copied from CRM [id number]. Topic: General - Other >> Oct 16, 2023 11:28 AM Burnard DEL wrote: Reason for CRM: Katelynn form [facility] would like to know if office received order [phone] for plan of care order for physical Therapy.that was faxed over on 10/10/2023

## 2023-10-23 ENCOUNTER — Telehealth: Payer: Self-pay

## 2023-10-23 NOTE — Telephone Encounter (Signed)
 [employer] fax sent 10/23/2023

## 2023-10-24 NOTE — Telephone Encounter (Unsigned)
 Copied from CRM #[id number]. Topic: Clinical - Medical Advice >> Oct 24, 2023  3:33 PM Charolett L wrote: Reason for CRM: [facility] home [facility], orders for plan of care [phone] & #[id number] sent on 09/15 made a follow up call on 09/22 Requesting an update and will be refaxing documents [phone]

## 2023-10-31 NOTE — Telephone Encounter (Signed)
 Katelyn from [facility] trying to confirm that we received orders that were faxed on 10/23/23. Order [phone]. Will refax today, callback number [phone]

## 2023-11-03 DIAGNOSIS — M6281 Muscle weakness (generalized): Secondary | ICD-10-CM | POA: Diagnosis not present

## 2023-11-03 DIAGNOSIS — M6250 Muscle wasting and atrophy, not elsewhere classified, unspecified site: Secondary | ICD-10-CM | POA: Diagnosis not present

## 2023-11-03 NOTE — Telephone Encounter (Signed)
 When checking through what had been faxed for patient the last few weeks I did not see this form. However I was informed that Dr.Plotnikov had gone home with some forms for sign off and believe that the form might be in with those.   Will also look out for the copy that is being faxed again today

## 2023-11-16 ENCOUNTER — Ambulatory Visit (HOSPITAL_COMMUNITY)
Admission: RE | Admit: 2023-11-16 | Discharge: 2023-11-16 | Disposition: A | Discharge status: Home / Self Care | Source: Ambulatory Visit | Attending: Internal Medicine | Admitting: Internal Medicine

## 2023-11-16 ENCOUNTER — Encounter: Payer: Self-pay | Admitting: Pharmacist

## 2023-11-16 DIAGNOSIS — I77819 Aortic ectasia, unspecified site: Secondary | ICD-10-CM | POA: Diagnosis not present

## 2023-11-16 DIAGNOSIS — J439 Emphysema, unspecified: Secondary | ICD-10-CM | POA: Diagnosis not present

## 2023-11-16 DIAGNOSIS — I77810 Thoracic aortic ectasia: Secondary | ICD-10-CM | POA: Diagnosis not present

## 2023-11-16 MED ORDER — IOHEXOL 350 MG/ML SOLN
75.0000 mL | Freq: Once | INTRAVENOUS | Status: AC | PRN
  Administered 2023-11-16: 75 mL via INTRAVENOUS

## 2023-11-16 NOTE — Progress Notes (Signed)
 Pharmacy Quality Measure Review  This patient is appearing on a report for being at risk of failing the adherence measure for hypertension (ACEi/ARB) medications this calendar year.   Medication: Losartan  Last fill date: 11/04/23 for 90 day supply  Insurance report was not up to date. No action needed at this time.   Darrelyn Drum, PharmD, BCPS, CPP Clinical Pharmacist Practitioner [facility] [phone]

## 2023-11-24 NOTE — Progress Notes (Signed)
 Pharmacy Quality Measure Review  This patient is appearing on a report for being at risk of failing the adherence measure for cholesterol (statin) medications this calendar year.   Medication: Lovastatin  Last fill date: 11/20/23 for 90 day supply  Insurance report was not up to date. No action needed at this time.   Darrelyn Drum, PharmD, BCPS, CPP Clinical Pharmacist Practitioner  [facility] at [facility] [phone]

## 2023-11-30 ENCOUNTER — Ambulatory Visit (HOSPITAL_COMMUNITY)
Admission: RE | Admit: 2023-11-30 | Discharge: 2023-11-30 | Disposition: A | Discharge status: Home / Self Care | Source: Ambulatory Visit | Attending: Physician Assistant | Admitting: Physician Assistant

## 2023-11-30 VITALS — BP 136/88 | HR 79 | Ht 69.0 in | Wt 218.0 lb

## 2023-11-30 DIAGNOSIS — I480 Paroxysmal atrial fibrillation: Secondary | ICD-10-CM | POA: Diagnosis not present

## 2023-11-30 DIAGNOSIS — D6869 Other thrombophilia: Secondary | ICD-10-CM | POA: Diagnosis not present

## 2023-11-30 DIAGNOSIS — I4891 Unspecified atrial fibrillation: Secondary | ICD-10-CM

## 2023-11-30 NOTE — Progress Notes (Signed)
 Primary Care Physician: Garald Karlynn GAILS, MD Primary Cardiologist: Stanly DELENA Leavens, MD Electrophysiologist: None  Referring Physician: ED   Jeffrey Carter is a 83 y.o. male with a history of HTN, HLD, OSA, atrial fibrillation who presents for follow up in the [facility].  The patient was initially diagnosed with atrial fibrillation 04/11/23 incidentally at his PCP office. He was being seen for left sided neck pain and was found to have an irregular pulse. ECG showed afib with RVR and he was sent to the ED for evaluation. Patient was started on Eliquis  for stroke prevention and Toprol  for rate control.   He wore 14 day cardiac monitor which showed 14% afib burden, avg HR 98 bpm.   Patient returns for follow up for atrial fibrillation. He is in SR today and feels well. He was hospitalized with a knee effusion in June and had some paroxysms of afib at that time. He was changed to carvedilol  and diltiazem  started. No bleeding issues on anticoagulation.   Today, he  denies symptoms of palpitations, chest pain, shortness of breath, orthopnea, PND, lower extremity edema, dizziness, presyncope, syncope, bleeding, or neurologic sequela. The patient is tolerating medications without difficulties and is otherwise without complaint today.    Atrial Fibrillation Risk Factors:  he does have symptoms or diagnosis of sleep apnea. he does not have a history of rheumatic fever. he does not have a history of alcohol use. The patient does not have a history of early familial atrial fibrillation or other arrhythmias.  Atrial Fibrillation Management history:  Previous antiarrhythmic drugs: none Previous cardioversions: none Previous ablations: none Anticoagulation history: Eliquis   ROS- All systems are reviewed and negative except as per the HPI above.  Past Medical History:  Diagnosis Date   Bladder outlet obstruction    BPH (benign prostatic hyperplasia)     Conjunctivitis, acute, bilateral    07-02-2015  per pcp note and started antibiotic drops   First degree heart block    History of acute conjunctivitis    07-02-2015  resolved after round of antibiotic eye drops   History of gout    BIG TOE   History of urinary retention 12/2014   Hx of adenomatous colonic polyps 07/07/2016   Hyperlipidemia    Hypertension    Lower urinary tract symptoms (LUTS)    OSA on CPAP    MODERATE PER STUDY 03-08-2012   Osteoarthritis    PAC (premature atrial contraction)    Prostate cancer [facility]) urologist-  dr budzyn/  oncologist-  dr julien   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 45 tablet 3   apixaban  (ELIQUIS ) 5 MG TABS tablet Take 1 tablet (5 mg total) by mouth 2 (two) times daily. 60 tablet 4   carvedilol  (COREG ) 25 MG tablet Take 1 tablet (25 mg total) by mouth 2 (two) times daily.     cloNIDine  (CATAPRES ) 0.3 MG tablet Take 1 tablet (0.3 mg total) by mouth 3 (three) times daily. 270 tablet 3   diclofenac  Sodium (VOLTAREN ) 1 % GEL Apply  4 g topically 4 (four) times daily as needed (knee pain).     diltiazem  (CARDIZEM  CD) 300 MG 24 hr capsule Take 1 capsule (300 mg total) by mouth daily.     diphenoxylate -atropine  (LOMOTIL ) 2.5-0.025 MG tablet Take 1-2 tablets by mouth 4 (four) times daily as needed for diarrhea or loose stools. (Patient taking differently: Take 1-2 tablets by mouth as needed for diarrhea or loose stools.) 60 tablet 0   EQ ALLERGY RELIEF 10 MG tablet TAKE ONE TABLET BY MOUTH ONCE DAILY 90 tablet 3   finasteride  (PROSCAR ) 5 MG tablet Take 5 mg by mouth  daily.     furosemide  (LASIX ) 20 MG tablet Take 20 mg by mouth. (Patient taking differently: Take 20 mg by mouth daily.)     hydrochlorothiazide  (HYDRODIURIL ) 25 MG tablet Take 1 tablet (25 mg total) by mouth daily. 30 tablet 3   HYDROcodone -acetaminophen  (NORCO) 7.5-325 MG tablet Take 1 tablet by mouth 2 (two) times daily as needed for moderate pain (pain score 4-6). (Patient taking differently: Take 1 tablet by mouth as needed for moderate pain (pain score 4-6).) 60 tablet 0   losartan  (COZAAR ) 100 MG tablet Take 1 tablet by mouth once daily 90 tablet 3   lovastatin  (MEVACOR ) 40 MG tablet TAKE 1 TABLET BY MOUTH ONCE DAILY IN THE EVENING AT  6PM 90 tablet 2   methocarbamol  (ROBAXIN ) 500 MG tablet Take 1 tablet (500 mg total) by mouth 2 (two) times daily. (Patient taking differently: Take 500 mg by mouth as needed.) 10 tablet 0   ondansetron  (ZOFRAN -ODT) 4 MG disintegrating tablet Take 1 tablet (4 mg total) by mouth every 8 (eight) hours as needed for nausea or vomiting. 20 tablet 0   pantoprazole  (PROTONIX ) 40 MG tablet Take 1 tablet (40 mg total) by mouth daily. 30 tablet 1   potassium chloride  (KLOR-CON  M) 10 MEQ tablet Take 1 tablet (10 mEq total) by mouth 2 (two) times daily. 30 tablet 0   senna-docusate (SENOKOT-S) 8.6-50 MG tablet Take 1 tablet by mouth at bedtime as needed for mild constipation.     tamsulosin  (FLOMAX ) 0.4 MG CAPS capsule Take 1 capsule (0.4 mg total) by mouth daily. 90 capsule 1   apixaban  (ELIQUIS ) 5 MG TABS tablet Take 1 tablet (5 mg total) by mouth 2 (two) times daily. 28 tablet 0   No current facility-administered medications for this encounter.    Physical Exam: BP 136/88   Pulse 79   Ht 5' 9 (1.753 m)   Wt 98.9 kg   BMI 32.19 kg/m   GEN: Well nourished, well developed in no acute distress CARDIAC: Regular rate and rhythm, no murmurs, rubs, gallops RESPIRATORY:  Clear to auscultation without rales, wheezing or rhonchi  ABDOMEN: Soft, non-tender,  non-distended EXTREMITIES:  No edema; No deformity    Wt Readings from Last 3 Encounters:  11/30/23 98.9 kg  10/12/23 96 kg  10/04/23 95.9 kg     EKG today demonstrates  SR, 1st degree AV block, NST Vent. rate 79 BPM PR interval 254 ms QRS duration 96 ms QT/QTcB 404/463 ms   Echo 04/26/23  1. Left ventricular ejection fraction, by estimation, is 55 to 60%. Left  ventricular ejection fraction by 3D volume is 56 %. The left ventricle has  normal function. The left ventricle has no regional wall motion  abnormalities. Left ventricular diastolic function could not be evaluated.   2. Right ventricular systolic function is normal. The right ventricular  size is normal. There  is mildly elevated pulmonary artery systolic  pressure. The estimated right ventricular systolic pressure is 42.3 mmHg.   3. The mitral valve is abnormal. Mild mitral valve regurgitation.   4. The aortic valve is tricuspid. Aortic valve regurgitation is not  visualized. No aortic stenosis is present.   5. Aortic dilatation noted. There is moderate dilatation of the ascending  aorta, measuring 45 mm.   6. The inferior vena cava is normal in size with <50% respiratory  variability, suggesting right atrial pressure of 8 mmHg.   Comparison(s): No prior Echocardiogram.    CHA2DS2-VASc Score = 3  The patient's score is based upon: CHF History: 0 HTN History: 1 Diabetes History: 0 Stroke History: 0 Vascular Disease History: 0 Age Score: 2 Gender Score: 0       ASSESSMENT AND PLAN: Paroxysmal Atrial Fibrillation (ICD10:  I48.0) The patient's CHA2DS2-VASc score is 3, indicating a 3.2% annual risk of stroke.   Patient appears to be maintaining SR. Recall, patient and family desire a conservative rate control approach to afib.  Continue carvedilol  25 mg BID Continue Eliquis  5 mg BID Continue diltiazem  300 mg daily  Secondary Hypercoagulable State (ICD10:  D68.69) The patient is at significant risk for  stroke/thromboembolism based upon his CHA2DS2-VASc Score of 3.  Continue Apixaban  (Eliquis ). No bleeding issues.   HTN Stable on current regimen  OSA  Encouraged nightly CPAP   Follow up with Dr Wendel or APP per recall. AF clinic as needed.      Daril Kicks PA-C [facility] [address] [place], [place] [address] [phone]

## 2023-12-04 ENCOUNTER — Telehealth: Payer: Self-pay

## 2023-12-04 NOTE — Telephone Encounter (Signed)
 Copied from CRM [id number]. Topic: General - Call Back - No Documentation >> Dec 04, 2023  2:47 PM Rea C wrote: Reason for CRM: Reche Confirming orders were faxed over on 11/05. Did not see anything in patients chart and she will re-fax the documents over.

## 2023-12-06 DIAGNOSIS — C61 Malignant neoplasm of prostate: Secondary | ICD-10-CM | POA: Diagnosis not present

## 2023-12-06 DIAGNOSIS — N320 Bladder-neck obstruction: Secondary | ICD-10-CM

## 2023-12-06 DIAGNOSIS — I480 Paroxysmal atrial fibrillation: Secondary | ICD-10-CM

## 2023-12-06 DIAGNOSIS — M1712 Unilateral primary osteoarthritis, left knee: Secondary | ICD-10-CM | POA: Diagnosis not present

## 2023-12-06 DIAGNOSIS — D630 Anemia in neoplastic disease: Secondary | ICD-10-CM | POA: Diagnosis not present

## 2023-12-06 DIAGNOSIS — R338 Other retention of urine: Secondary | ICD-10-CM

## 2023-12-06 DIAGNOSIS — K219 Gastro-esophageal reflux disease without esophagitis: Secondary | ICD-10-CM

## 2023-12-06 DIAGNOSIS — I10 Essential (primary) hypertension: Secondary | ICD-10-CM | POA: Diagnosis not present

## 2023-12-06 DIAGNOSIS — K5909 Other constipation: Secondary | ICD-10-CM

## 2023-12-06 DIAGNOSIS — I719 Aortic aneurysm of unspecified site, without rupture: Secondary | ICD-10-CM

## 2023-12-06 DIAGNOSIS — N401 Enlarged prostate with lower urinary tract symptoms: Secondary | ICD-10-CM

## 2023-12-06 DIAGNOSIS — M109 Gout, unspecified: Secondary | ICD-10-CM

## 2023-12-27 ENCOUNTER — Telehealth: Payer: Self-pay

## 2023-12-27 NOTE — Telephone Encounter (Signed)
 Copied from CRM #[id number]. Topic: General - Other >> Dec 27, 2023  2:15 PM Mesmerise C wrote: Reason for CRM: [name] from [facility] wanted to report BP readings had to put PT on hold on 11/18 was 172/90 waited 10 min rechecked and was 156/99 on 11/23 was 195/79 waited 10 min and it was 188/103 and today was 156/78, recommending BP medication adjustments or if Dr. Garald is okay with BP wanting to get order for a changing parameters

## 2024-01-02 MED ORDER — OLMESARTAN MEDOXOMIL 40 MG PO TABS: 40.0000 mg | ORAL_TABLET | Freq: Every day | ORAL | 3 refills | Status: AC

## 2024-01-02 NOTE — Addendum Note (Signed)
 Addended by: Raidyn Wassink V on: 01/02/2024 08:24 AM   Modules accepted: Orders

## 2024-01-02 NOTE — Telephone Encounter (Signed)
 I spoke with the pts daughter to inform her of the providers instructions as follows Discontinue losartan . Start olmesartan  40 mg instead. Okay to do physical therapy if SBP<180 Thanks she has stated understanding and has no questions at this time.

## 2024-01-02 NOTE — Telephone Encounter (Signed)
 Discontinue losartan . Start olmesartan  40 mg instead. Okay to do physical therapy if SBP<180 Thanks

## 2024-01-19 ENCOUNTER — Other Ambulatory Visit: Payer: Self-pay | Admitting: Internal Medicine

## 2024-02-02 ENCOUNTER — Other Ambulatory Visit: Payer: Self-pay | Admitting: Internal Medicine

## 2024-02-10 ENCOUNTER — Encounter: Payer: Self-pay | Admitting: Internal Medicine

## 2024-04-02 ENCOUNTER — Ambulatory Visit: Admitting: Internal Medicine

## 2024-04-15 ENCOUNTER — Ambulatory Visit: Admitting: Internal Medicine

## 2024-06-03 ENCOUNTER — Ambulatory Visit
